# Patient Record
Sex: Male | Born: 1960
Health system: Southern US, Community
[De-identification: ages and names within clinical notes are randomized; demographics above are authoritative.]

## PROBLEM LIST (undated history)

## (undated) ENCOUNTER — Emergency Department (HOSPITAL_COMMUNITY): Payer: Self-pay | Source: Home / Self Care

## (undated) DIAGNOSIS — E7800 Pure hypercholesterolemia, unspecified: Secondary | ICD-10-CM

## (undated) DIAGNOSIS — I255 Ischemic cardiomyopathy: Secondary | ICD-10-CM

## (undated) DIAGNOSIS — I10 Essential (primary) hypertension: Secondary | ICD-10-CM

## (undated) DIAGNOSIS — M1990 Unspecified osteoarthritis, unspecified site: Secondary | ICD-10-CM

## (undated) DIAGNOSIS — I219 Acute myocardial infarction, unspecified: Secondary | ICD-10-CM

## (undated) DIAGNOSIS — K219 Gastro-esophageal reflux disease without esophagitis: Secondary | ICD-10-CM

## (undated) DIAGNOSIS — I2510 Atherosclerotic heart disease of native coronary artery without angina pectoris: Secondary | ICD-10-CM

## (undated) DIAGNOSIS — E1140 Type 2 diabetes mellitus with diabetic neuropathy, unspecified: Secondary | ICD-10-CM

## (undated) DIAGNOSIS — K279 Peptic ulcer, site unspecified, unspecified as acute or chronic, without hemorrhage or perforation: Secondary | ICD-10-CM

## (undated) DIAGNOSIS — G629 Polyneuropathy, unspecified: Secondary | ICD-10-CM

## (undated) DIAGNOSIS — Z87442 Personal history of urinary calculi: Secondary | ICD-10-CM

## (undated) DIAGNOSIS — E669 Obesity, unspecified: Secondary | ICD-10-CM

## (undated) DIAGNOSIS — K859 Acute pancreatitis without necrosis or infection, unspecified: Secondary | ICD-10-CM

## (undated) DIAGNOSIS — F419 Anxiety disorder, unspecified: Secondary | ICD-10-CM

## (undated) HISTORY — DX: Ischemic cardiomyopathy: I25.5

## (undated) HISTORY — DX: Type 2 diabetes mellitus with diabetic neuropathy, unspecified: E11.40

---

## 2011-01-02 DIAGNOSIS — I219 Acute myocardial infarction, unspecified: Secondary | ICD-10-CM

## 2011-01-02 HISTORY — PX: ESOPHAGOGASTRODUODENOSCOPY: SHX1529

## 2011-01-02 HISTORY — DX: Acute myocardial infarction, unspecified: I21.9

## 2011-06-17 DIAGNOSIS — E785 Hyperlipidemia, unspecified: Secondary | ICD-10-CM | POA: Diagnosis present

## 2011-06-17 DIAGNOSIS — Z6838 Body mass index (BMI) 38.0-38.9, adult: Secondary | ICD-10-CM

## 2011-06-17 DIAGNOSIS — I10 Essential (primary) hypertension: Secondary | ICD-10-CM | POA: Diagnosis present

## 2011-06-17 DIAGNOSIS — K269 Duodenal ulcer, unspecified as acute or chronic, without hemorrhage or perforation: Secondary | ICD-10-CM | POA: Diagnosis present

## 2011-06-17 DIAGNOSIS — K259 Gastric ulcer, unspecified as acute or chronic, without hemorrhage or perforation: Principal | ICD-10-CM | POA: Diagnosis present

## 2011-06-17 DIAGNOSIS — E669 Obesity, unspecified: Secondary | ICD-10-CM | POA: Diagnosis present

## 2011-06-17 DIAGNOSIS — IMO0001 Reserved for inherently not codable concepts without codable children: Secondary | ICD-10-CM | POA: Diagnosis present

## 2011-06-17 DIAGNOSIS — E7800 Pure hypercholesterolemia, unspecified: Secondary | ICD-10-CM | POA: Diagnosis present

## 2011-06-17 DIAGNOSIS — K859 Acute pancreatitis without necrosis or infection, unspecified: Secondary | ICD-10-CM | POA: Diagnosis present

## 2011-06-17 DIAGNOSIS — Z79899 Other long term (current) drug therapy: Secondary | ICD-10-CM

## 2011-06-17 DIAGNOSIS — Z7982 Long term (current) use of aspirin: Secondary | ICD-10-CM

## 2011-06-18 ENCOUNTER — Emergency Department (HOSPITAL_COMMUNITY): Payer: Medicaid Other

## 2011-06-18 ENCOUNTER — Encounter (HOSPITAL_COMMUNITY): Payer: Self-pay

## 2011-06-18 ENCOUNTER — Encounter (HOSPITAL_COMMUNITY)
Admission: EM | Disposition: A | Discharge status: Home / Self Care | Payer: Self-pay | Source: Home / Self Care | Attending: Internal Medicine

## 2011-06-18 ENCOUNTER — Inpatient Hospital Stay (HOSPITAL_COMMUNITY)
Admission: EM | Admit: 2011-06-18 | Discharge: 2011-06-19 | DRG: 383 | Disposition: A | Discharge status: Home / Self Care | Payer: Medicaid Other | Attending: Internal Medicine | Admitting: Internal Medicine

## 2011-06-18 DIAGNOSIS — K259 Gastric ulcer, unspecified as acute or chronic, without hemorrhage or perforation: Secondary | ICD-10-CM

## 2011-06-18 DIAGNOSIS — E109 Type 1 diabetes mellitus without complications: Secondary | ICD-10-CM | POA: Diagnosis present

## 2011-06-18 DIAGNOSIS — K269 Duodenal ulcer, unspecified as acute or chronic, without hemorrhage or perforation: Secondary | ICD-10-CM

## 2011-06-18 DIAGNOSIS — I10 Essential (primary) hypertension: Secondary | ICD-10-CM

## 2011-06-18 DIAGNOSIS — K859 Acute pancreatitis without necrosis or infection, unspecified: Secondary | ICD-10-CM

## 2011-06-18 DIAGNOSIS — E1165 Type 2 diabetes mellitus with hyperglycemia: Secondary | ICD-10-CM

## 2011-06-18 DIAGNOSIS — E782 Mixed hyperlipidemia: Secondary | ICD-10-CM | POA: Diagnosis present

## 2011-06-18 DIAGNOSIS — R109 Unspecified abdominal pain: Secondary | ICD-10-CM

## 2011-06-18 DIAGNOSIS — R1013 Epigastric pain: Secondary | ICD-10-CM | POA: Diagnosis present

## 2011-06-18 DIAGNOSIS — IMO0001 Reserved for inherently not codable concepts without codable children: Secondary | ICD-10-CM

## 2011-06-18 DIAGNOSIS — E785 Hyperlipidemia, unspecified: Secondary | ICD-10-CM | POA: Diagnosis present

## 2011-06-18 DIAGNOSIS — E669 Obesity, unspecified: Secondary | ICD-10-CM

## 2011-06-18 HISTORY — DX: Essential (primary) hypertension: I10

## 2011-06-18 HISTORY — DX: Pure hypercholesterolemia, unspecified: E78.00

## 2011-06-18 HISTORY — PX: ESOPHAGOGASTRODUODENOSCOPY: SHX5428

## 2011-06-18 LAB — LIPASE, BLOOD: Lipase: 274 U/L — ABNORMAL HIGH (ref 11–59)

## 2011-06-18 LAB — BASIC METABOLIC PANEL
CO2: 25 mEq/L (ref 19–32)
Calcium: 10.1 mg/dL (ref 8.4–10.5)
Creatinine, Ser: 0.81 mg/dL (ref 0.50–1.35)
Glucose, Bld: 314 mg/dL — ABNORMAL HIGH (ref 70–99)
Sodium: 133 mEq/L — ABNORMAL LOW (ref 135–145)

## 2011-06-18 LAB — DIFFERENTIAL
Blasts: 0 %
Lymphocytes Relative: 19 % (ref 12–46)
Lymphs Abs: 2.1 10*3/uL (ref 0.7–4.0)
Neutro Abs: 7.5 10*3/uL (ref 1.7–7.7)
Neutrophils Relative %: 66 % (ref 43–77)
Promyelocytes Absolute: 0 %
nRBC: 0 /100 WBC

## 2011-06-18 LAB — URINALYSIS, ROUTINE W REFLEX MICROSCOPIC
Glucose, UA: >1000 mg/dL — AB
Hgb urine dipstick: NEGATIVE
Leukocytes, UA: NEGATIVE
Protein, ur: NEGATIVE mg/dL

## 2011-06-18 LAB — HEPATIC FUNCTION PANEL
AST: 26 U/L (ref 0–37)
Albumin: 4.2 g/dL (ref 3.5–5.2)
Bilirubin, Direct: 0.1 mg/dL (ref 0.0–0.3)
Total Bilirubin: 0.4 mg/dL (ref 0.3–1.2)

## 2011-06-18 LAB — HEMOGLOBIN A1C: Mean Plasma Glucose: 235 mg/dL — ABNORMAL HIGH (ref <117)

## 2011-06-18 LAB — URINE MICROSCOPIC-ADD ON

## 2011-06-18 LAB — CBC
MCHC: 36.3 g/dL — ABNORMAL HIGH (ref 30.0–36.0)
RDW: 12.3 % (ref 11.5–15.5)

## 2011-06-18 LAB — GLUCOSE, CAPILLARY: Glucose-Capillary: 233 mg/dL — ABNORMAL HIGH (ref 70–99)

## 2011-06-18 LAB — TSH: TSH: 0.445 u[IU]/mL (ref 0.350–4.500)

## 2011-06-18 SURGERY — EGD (ESOPHAGOGASTRODUODENOSCOPY)
Anesthesia: Moderate Sedation

## 2011-06-18 MED ORDER — ONDANSETRON HCL 4 MG/2ML IJ SOLN: 4.0000 mg | Freq: Four times a day (QID) | INTRAMUSCULAR | Status: DC | PRN

## 2011-06-18 MED ORDER — INSULIN ASPART 100 UNIT/ML ~~LOC~~ SOLN
0.0000 [IU] | Freq: Every day | SUBCUTANEOUS | Status: DC
  Administered 2011-06-18: 2 [IU] via SUBCUTANEOUS

## 2011-06-18 MED ORDER — HYDRALAZINE HCL 20 MG/ML IJ SOLN
INTRAMUSCULAR | Status: AC
  Filled 2011-06-18: qty 1

## 2011-06-18 MED ORDER — MEPERIDINE HCL 100 MG/ML IJ SOLN
INTRAMUSCULAR | Status: DC | PRN
  Administered 2011-06-18: 25 mg via INTRAVENOUS
  Administered 2011-06-18: 50 mg via INTRAVENOUS

## 2011-06-18 MED ORDER — ONDANSETRON HCL 4 MG PO TABS: 4.0000 mg | ORAL_TABLET | Freq: Four times a day (QID) | ORAL | Status: DC | PRN

## 2011-06-18 MED ORDER — STERILE WATER FOR IRRIGATION IR SOLN
Status: DC | PRN
  Administered 2011-06-18: 14:00:00

## 2011-06-18 MED ORDER — MORPHINE SULFATE 2 MG/ML IJ SOLN
2.0000 mg | INTRAMUSCULAR | Status: DC | PRN
  Administered 2011-06-18 – 2011-06-19 (×3): 2 mg via INTRAVENOUS
  Filled 2011-06-18 (×3): qty 1

## 2011-06-18 MED ORDER — HEPARIN SODIUM (PORCINE) 5000 UNIT/ML IJ SOLN
5000.0000 [IU] | Freq: Three times a day (TID) | INTRAMUSCULAR | Status: DC
  Administered 2011-06-18 – 2011-06-19 (×2): 5000 [IU] via SUBCUTANEOUS
  Filled 2011-06-18 (×2): qty 1

## 2011-06-18 MED ORDER — PANTOPRAZOLE SODIUM 40 MG IV SOLR
40.0000 mg | INTRAVENOUS | Status: DC
  Administered 2011-06-18: 40 mg via INTRAVENOUS
  Filled 2011-06-18: qty 40

## 2011-06-18 MED ORDER — POTASSIUM CHLORIDE IN NACL 20-0.9 MEQ/L-% IV SOLN
INTRAVENOUS | Status: DC
  Administered 2011-06-18 – 2011-06-19 (×2): via INTRAVENOUS

## 2011-06-18 MED ORDER — ONDANSETRON HCL 4 MG/2ML IJ SOLN
4.0000 mg | Freq: Once | INTRAMUSCULAR | Status: AC
  Administered 2011-06-18: 4 mg via INTRAVENOUS
  Filled 2011-06-18: qty 2

## 2011-06-18 MED ORDER — BUTAMBEN-TETRACAINE-BENZOCAINE 2-2-14 % EX AERO
INHALATION_SPRAY | CUTANEOUS | Status: DC | PRN
  Administered 2011-06-18: 2 via TOPICAL

## 2011-06-18 MED ORDER — HYDROMORPHONE HCL PF 1 MG/ML IJ SOLN: 1.0000 mg | INTRAMUSCULAR | Status: DC | PRN

## 2011-06-18 MED ORDER — HYDRALAZINE HCL 20 MG/ML IJ SOLN: 10.0000 mg | Freq: Once | INTRAMUSCULAR | Status: DC

## 2011-06-18 MED ORDER — MIDAZOLAM HCL 5 MG/5ML IJ SOLN
INTRAMUSCULAR | Status: DC | PRN
  Administered 2011-06-18: 2 mg via INTRAVENOUS
  Administered 2011-06-18: 1 mg via INTRAVENOUS
  Administered 2011-06-18: 2 mg via INTRAVENOUS

## 2011-06-18 MED ORDER — PANTOPRAZOLE SODIUM 40 MG IV SOLR
40.0000 mg | Freq: Two times a day (BID) | INTRAVENOUS | Status: DC
  Administered 2011-06-18: 40 mg via INTRAVENOUS
  Filled 2011-06-18: qty 40

## 2011-06-18 MED ORDER — SODIUM CHLORIDE 0.9 % IV SOLN: INTRAVENOUS | Status: AC

## 2011-06-18 MED ORDER — SODIUM CHLORIDE 0.45 % IV SOLN
INTRAVENOUS | Status: DC
  Administered 2011-06-18: 13:00:00 via INTRAVENOUS

## 2011-06-18 MED ORDER — MEPERIDINE HCL 100 MG/ML IJ SOLN
INTRAMUSCULAR | Status: AC
  Filled 2011-06-18: qty 1

## 2011-06-18 MED ORDER — ONDANSETRON HCL 4 MG/2ML IJ SOLN: 4.0000 mg | Freq: Three times a day (TID) | INTRAMUSCULAR | Status: DC | PRN

## 2011-06-18 MED ORDER — HYDROMORPHONE HCL PF 1 MG/ML IJ SOLN
1.0000 mg | Freq: Once | INTRAMUSCULAR | Status: AC
  Administered 2011-06-18: 1 mg via INTRAVENOUS
  Filled 2011-06-18: qty 1

## 2011-06-18 MED ORDER — ONDANSETRON HCL 4 MG/2ML IJ SOLN
4.0000 mg | Freq: Four times a day (QID) | INTRAMUSCULAR | Status: DC | PRN
  Administered 2011-06-18: 4 mg via INTRAVENOUS
  Filled 2011-06-18: qty 2

## 2011-06-18 MED ORDER — SODIUM CHLORIDE 0.9 % IJ SOLN
INTRAMUSCULAR | Status: AC
  Administered 2011-06-18: 10 mL
  Filled 2011-06-18: qty 3

## 2011-06-18 MED ORDER — MIDAZOLAM HCL 5 MG/5ML IJ SOLN
INTRAMUSCULAR | Status: AC
  Filled 2011-06-18: qty 10

## 2011-06-18 MED ORDER — INSULIN ASPART 100 UNIT/ML ~~LOC~~ SOLN
0.0000 [IU] | Freq: Three times a day (TID) | SUBCUTANEOUS | Status: DC
  Administered 2011-06-18: 4 [IU] via SUBCUTANEOUS
  Administered 2011-06-19: 7 [IU] via SUBCUTANEOUS

## 2011-06-18 NOTE — ED Notes (Signed)
Head hurts, having abdominal pain across my entire abdomen, hurting in back per pt. Bp was high tonight per pt.

## 2011-06-18 NOTE — ED Provider Notes (Signed)
51yo M, c/o upper abd pain that began after eating meal yesterday.  Pain more upper abd/mid-epigastric areas, radiates into back.  +elevated lipase.  LFT's normal.  Afebrile.  [place] abd without gallstones/acute cholecystitis.  Pt is comfortable after pain meds.   Dx testing d/w pt and family.  Questions answered.  Verb understanding, agreeable to admit.  T/C to [facility] Dr. Karilyn Cota, case discussed, including:  HPI, pertinent PM/SHx, VS/PE, dx testing, ED course and treatment:  Agreeable to admit, requests to write temporary orders, obtain medical bed to team 2.   Laray Anger, DO 06/18/11 [phone]

## 2011-06-18 NOTE — Consult Note (Signed)
Referring Provider: No ref. provider found Primary Care Physician:  Leo Grosser, MD Primary Gastroenterologist:  Jonette Eva  Reason for Consultation:  ABDOMINAL PAIN  HPI:  PT STARTED HAVING INTERMITTENT ABD PAIN FOR THE LAST WEEK AD A HALF. TAKING IBUPROFEN PRN FOR PAIN. FEELING OK UNTIL ~10 DAYS AGO. HAS NAUSEA BUT NO VOMITING, ?MELENA. PAIN FEELS LIKE A PRESSURE. START IN MID ABD AND RADIATED UNDER BOTH RIBS AND TO HIS BACK. NO DIARRHEA. HAS FELT CONSTIPATED. NO PROBLEMS SWALLOWING OR UNCONTROLLED HEARTBURN. NO WIGHT LOSS. NEVER HAD A TCS. No problems with sedation.   Past Medical History  Diagnosis Date  . Diabetes mellitus   . High cholesterol   . Hypertension     History reviewed. No pertinent past surgical history.  Prior to Admission medications   Medication Sig Start Date End Date Taking? Authorizing Provider  aspirin EC 81 MG tablet Take 81 mg by mouth daily.   Yes Historical Provider, MD  glipiZIDE (GLUCOTROL) 10 MG tablet Take 10 mg by mouth 2 (two) times daily before a meal.   Yes Historical Provider, MD  lisinopril (PRINIVIL,ZESTRIL) 5 MG tablet Take 5 mg by mouth daily.   Yes Historical Provider, MD  metFORMIN (GLUCOPHAGE) 500 MG tablet Take 500 mg by mouth 2 (two) times daily with a meal.   Yes Historical Provider, MD  Red Yeast Rice Extract (RED YEAST RICE PO) Take 1 capsule by mouth 2 (two) times daily.   Yes Historical Provider, MD    Current Facility-Administered Medications  Medication Dose Route Frequency Provider Last Rate Last Dose  . 0.9 %  sodium chloride infusion   Intravenous STAT Laray Anger, DO      . 0.9 % NaCl with KCl 20 mEq/ L  infusion   Intravenous Continuous Nimish C Karilyn Cota, MD 75 mL/hr at 06/18/11 1113    . heparin injection 5,000 Units  5,000 Units Subcutaneous Q8H Nimish C Gosrani, MD      . HYDROmorphone (DILAUDID) injection 1 mg  1 mg Intravenous Once Nicoletta Dress. Colon Branch, MD   1 mg at 06/18/11 0333  . insulin aspart (novoLOG)  injection 0-20 Units  0-20 Units Subcutaneous TID WC Nimish C Gosrani, MD      . insulin aspart (novoLOG) injection 0-5 Units  0-5 Units Subcutaneous QHS Nimish C Gosrani, MD      . morphine 2 MG/ML injection 2 mg  2 mg Intravenous Q4H PRN Wilson Singer, MD   2 mg at 06/18/11 1248  . ondansetron (ZOFRAN) injection 4 mg  4 mg Intravenous Once [employer]. Colon Branch, MD   4 mg at 06/18/11 0333  . ondansetron (ZOFRAN) tablet 4 mg  4 mg Oral Q6H PRN Nimish Normajean Glasgow, MD       Or  . ondansetron (ZOFRAN) injection 4 mg  4 mg Intravenous Q6H PRN Nimish C Gosrani, MD      . pantoprazole (PROTONIX) injection 40 mg  40 mg Intravenous BID [place] Bali, MD      . sodium chloride 0.9 % injection        10 mL at 06/18/11 1124  . DISCONTD: HYDROmorphone (DILAUDID) injection 1 mg  1 mg Intravenous Q4H PRN Laray Anger, DO      . DISCONTD: ondansetron [facility]) injection 4 mg  4 mg Intravenous Q8H PRN Laray Anger, DO      . DISCONTD: ondansetron [facility]) injection 4 mg  4 mg Intravenous Q6H PRN Nimish Normajean Glasgow, MD      .  DISCONTD: ondansetron (ZOFRAN) injection 4 mg  4 mg Intravenous Q6H PRN Nimish C Gosrani, MD      . DISCONTD: ondansetron (ZOFRAN) tablet 4 mg  4 mg Oral Q6H PRN Nimish Normajean Glasgow, MD      . DISCONTD: pantoprazole (PROTONIX) injection 40 mg  40 mg Intravenous Q24H Nimish C Gosrani, MD   40 mg at 06/18/11 1123    Allergies as of 06/17/2011  . (No Known Allergies)    Family History:  Colon Cancer  negative                           Polyps  negative   History   Social History  . Marital Status: Married    Spouse Name: N/A    Number of Children: N/A  . Years of Education: N/A   Occupational History  . Not on file.   Social History Main Topics  . Smoking status: Never Smoker   . Smokeless tobacco: Not on file  . Alcohol Use: No  . Drug Use: No  . Sexually Active: Yes   Other Topics Concern  . Not on file   Social History Narrative  . No narrative on file     Review of Systems: PER HPI OTHERWISE ALL SYSTEMS NEGATIVE    Vitals: Blood pressure 144/79, pulse 83, temperature 98 F (36.7 C), temperature source Oral, resp. rate 20, height 5\' 10"  (1.778 m), weight 269 lb 13.5 oz (122.4 kg), SpO2 96.00%.  Physical Exam: General:   Alert,  Well-developed, well-nourished, pleasant and cooperative in NAD Head:  Normocephalic and atraumatic. Eyes:  Sclera clear, no icterus.   Conjunctiva pink. Mouth:  No deformity or lesions, dentition normal. Neck:  Supple;  Lungs:  Clear throughout to auscultation.   No wheezes. No acute distress. Heart:  Regular rate and rhythm; no murmurs. Abdomen:  Soft, OBESE, nondistended. No massesnoted. MILD TO  MOD TTP IN BUQs AND EPIGASTRIUM. Normal bowel sounds, without guarding, and without rebound.   Msk:  Symmetrical without gross deformities.  Extremities:  Without edema. Neurologic:  Alert and  oriented x4;  grossly normal neurologically. Cervical Nodes:  No significant cervical adenopathy. Psych:  Alert and cooperative. Normal mood and affect.   Lab Results:  [facility] 06/18/11 0149  WBC 11.3*  HGB 16.9  HCT 46.5  PLT 268   BMET  Basename 06/18/11 0149  NA 133*  K 4.2  CL 96  CO2 25  GLUCOSE 314*  BUN 19  CREATININE 0.81  CALCIUM 10.1   LFT  Basename 06/18/11 0149  PROT 7.8  ALBUMIN 4.2  AST 26  ALT 43  ALKPHOS 119*  BILITOT 0.4  BILIDIR 0.1  IBILI 0.3     Studies/Results: [facility] REVIEWED WITH DR. Tyron Russell. NO FREE AIR. U/S: PANCREAS NOT VISUALIZED, NO GALLSTONES, ? FATTY LIVER   Impression: ABDOMINAL PAIN/NAUSEA FOR 7-10 DAYS, GOT WORSE LAST NIGHT & ASSOCIATED WITH ELEVATED LIPASE & GLUCOSE & ESSENTIALLY NL HFP. NO RISK FACTORS FOR PANCREATITIS. PT HAS BEEN USING IBUPROFEN PRN FOR ABDOMINAL PAIN.  Plan: EGD TODAY TO EVALUATE FOR PUD. CONSIDER EUS AS OP. BID PPI. WOULD RECOMMEND CT ABD W/ IVC AFTER THE SCANNER IS OPERATING. CHECK TRIGLYCERIDES.   LOS: 0 days   Jonette Eva   06/18/2011, 12:52 PM

## 2011-06-18 NOTE — H&P (Signed)
Juan Ray MRN: [id number] DOB/AGE: 1960/01/09 51 y.o. Primary Care Physician:PICKARD,WARREN TOM, MD Admit date: 06/18/2011 Chief Complaint: Epigastric abdominal pain. HPI: This 51 year old man gives a 10 day history of the above symptoms. The epigastric abdominal pain is sharp in nature and radiates to the back. He has not had significant vomiting although he has felt nauseous on 12 occasions. Approximately one week ago he also noticed the passage of one episode of black stool. He has been taking significant amounts of ibuprofen for his abdominal pain. He also takes aspirin 81 mg daily. He does not drink alcohol whatsoever. He does not have a history of hyperlipidemia. Ultrasound of his abdomen in the emergency room did not reveal presence of gallstones. There was suspicion of fatty liver. He is now being admitted for further management.  Past Medical History  Diagnosis Date  . Diabetes mellitus   . High cholesterol   . Hypertension          No family history on file.  Social History:  He is married. 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 He does not smoke, does not drink alcohol.  Allergies: No Known Allergies       JXB:JYNWG from the symptoms mentioned above,there are no other symptoms referable to all systems reviewed.  Physical Exam: Blood pressure 123/83, pulse 91, temperature 98.4 F (36.9 C), temperature source Oral, resp. rate 14, height 5\' 10"  (1.778 m), weight 122.471 kg (270 lb), SpO2 96.00%. He looks systemically well. He is obese. He does not appear to be in acute pain. Heart sounds are present and normal. Lung fields are clear. Abdomen is soft and nontender. Bowel sounds are heard. There is no hepatosplenomegaly. There are no masses felt. He is alert and orientated without any focal neurological signs.    Basename 06/18/11 0149  WBC 11.3*  NEUTROABS 7.5  HGB 16.9  HCT 46.5  MCV 90.5  PLT 268    Basename 06/18/11 0149  NA 133*  K 4.2  CL 96  CO2 25  GLUCOSE  314*  BUN 19  CREATININE 0.81  CALCIUM 10.1  MG --         US Abdomen Complete  06/18/2011  *RADIOLOGY REPORT*  Clinical Data:  Abdominal pain  COMPLETE ABDOMINAL ULTRASOUND  Comparison:  None.  Findings:  Gallbladder:  No gallstones, gallbladder wall thickening, or pericholecystic fluid.  Common bile duct:  Not identified  Liver:  No focal lesion identified. There is a coarse increased echogenicity probable due to fatty infiltration.  IVC:  Not visualized due to abundant bowel gas  Pancreas:   not visualized due to abundant bowel gas  Spleen: 10.8 cm in length.  Normal echogenicity.  Right Kidney:  Measures 12.9 cm in length.  No mass, hydronephrosis or diagnostic renal calculus  Left Kidney:  Measures 12.5 cm in length.  No mass, hydronephrosis or diagnostic renal calculus  Abdominal aorta: Not visualized due to patient body habitus.  IMPRESSION:  1.  No gallstones are noted within gallbladder. 2.  CBD is not visualized due to patient body habitus and bowel gas.  Pancreas and IVC are not visualized.  The abdominal aorta not visualized. 3.  No hydronephrosis or diagnostic renal calculus. 4.  Diffuse coarse increased echogenicity of the liver suspicious for fatty infiltration.  Original Report Authenticated By: Natasha Mead, M.D.   Dg Abd Acute W/chest  06/18/2011  *RADIOLOGY REPORT*  Clinical Data: Right upper quadrant abdominal pain for 4 days.  ACUTE ABDOMEN SERIES (ABDOMEN 2 VIEW & CHEST  1 VIEW)  Comparison: Chest radiograph performed 09/17/2003  Findings: The lungs are well-aerated and clear.  There is no evidence of focal opacification, pleural effusion or pneumothorax. The cardiomediastinal silhouette is within normal limits.  The visualized bowel gas pattern is unremarkable.  Scattered stool and air are seen within the colon; there is no evidence of small bowel dilatation to suggest obstruction.  No free intra-abdominal air is identified on the provided upright view.  No acute osseous  abnormalities are seen; the sacroiliac joints are unremarkable in appearance.  IMPRESSION:  1.  Unremarkable bowel gas pattern; no free intra-abdominal air seen. 2.  No acute cardiopulmonary process identified.  Original Report Authenticated By: Tonia Ghent, M.D.   Impression: 1. Epigastric pain radiating to the back,? Peptic ulcer versus pancreatitis. Lipase is elevated but the history is not suggestive of pancreatitis. CT scan is unavailable today from radiology. 2. Hypertension, controlled. 3. Type 2 diabetes mellitus, uncontrolled. 4. Hyperlipidemia. 5. Obesity.     Plan: 1. Admit to medical floor. 2. N.p.o. and IV fluids. 3. Gastroenterology consultation. I think this patient needs an EGD. I am not totally convinced by history that he has pancreatitis. 4. Monitor and control hypertension and diabetes. Check lipid panel, especially looking for hypertriglyceridemia. Further recommendations will depend on patient's hospital progress.      Wilson Singer Pager [phone]  06/18/2011, 10:26 AM

## 2011-06-18 NOTE — Progress Notes (Signed)
UR Chart Review Completed  

## 2011-06-18 NOTE — ED Provider Notes (Addendum)
History     CSN: [id number]  Arrival date & time 06/17/11  2359   First MD Initiated Contact with Patient 06/18/11 [phone]      Chief Complaint  Patient presents with  . Abdominal Pain  . Headache  . Hypertension    (Consider location/radiation/quality/duration/timing/severity/associated sxs/prior treatment) HPI Juan Ray is a 51 y.o. male who presents to the Emergency Department complaining of upper abdominal pain present for several days and worse tonight after eating the afternoon meal. Pain is across the upper abdomen and radiates into the back. Denies nausea, vomiting or diarrhea. Denies fever, chills. Has taken no medicine. Has been using ibuprofen for headaches over the last 10 days.   PCP Dr. Tanya Nones  Past Medical History  Diagnosis Date  . Diabetes mellitus   . High cholesterol   . Hypertension     History reviewed. No pertinent past surgical history.  No family history on file.  History  Substance Use Topics  . Smoking status: Never Smoker   . Smokeless tobacco: Not on file  . Alcohol Use: No      Review of Systems  Constitutional: Negative for fever.       10 Systems reviewed and are negative for acute change except as noted in the HPI.  HENT: Negative for congestion.   Eyes: Negative for discharge and redness.  Respiratory: Negative for cough and shortness of breath.   Cardiovascular: Negative for chest pain.  Gastrointestinal: Positive for abdominal pain. Negative for nausea and vomiting.  Musculoskeletal: Negative for back pain.  Skin: Negative for rash.  Neurological: Negative for syncope, numbness and headaches.  Psychiatric/Behavioral:       No behavior change.    Allergies  Review of patient's allergies indicates no known allergies.  Home Medications   Current Outpatient Rx  Name Route Sig Dispense Refill  . ASPIRIN 81 MG PO CHEW Oral Chew 81 mg by mouth daily.    Marland Kitchen GLIPIZIDE 10 MG PO TABS Oral Take 10 mg by mouth 2 (two) times  daily before a meal.    . LISINOPRIL 5 MG PO TABS Oral Take 5 mg by mouth daily.    Marland Kitchen METFORMIN HCL 500 MG PO TABS Oral Take 500 mg by mouth 2 (two) times daily with a meal.      BP 121/73  Pulse 89  Temp 97.8 F (36.6 C) (Oral)  Resp 16  Ht 5\' 10"  (1.778 m)  Wt 270 lb (122.471 kg)  BMI 38.74 kg/m2  SpO2 96%  Physical Exam  Nursing note and vitals reviewed. Constitutional: He is oriented to person, place, and time. He appears well-developed and well-nourished. He appears distressed.       Awake, alert, nontoxic appearance.  HENT:  Head: Normocephalic and atraumatic.  Eyes: Conjunctivae and EOM are normal. Pupils are equal, round, and reactive to light. Right eye exhibits no discharge. Left eye exhibits no discharge.  Neck: Normal range of motion. Neck supple.  Cardiovascular: Normal rate, normal heart sounds and intact distal pulses.  Exam reveals no gallop and no friction rub.   No murmur heard. Pulmonary/Chest: Effort normal. He has no wheezes. He has no rales. He exhibits no tenderness.  Abdominal: Soft. Bowel sounds are normal. He exhibits no distension and no mass. There is tenderness. There is no rebound and no guarding.       RUQ and epigastric tenderness to palpation.   Musculoskeletal: He exhibits no tenderness.       Baseline ROM, no obvious  new focal weakness.  Neurological: He is alert and oriented to person, place, and time. He has normal reflexes.       Mental status and motor strength appears baseline for patient and situation.  Skin: Skin is warm and dry. No rash noted.  Psychiatric: He has a normal mood and affect.    ED Course  Procedures (including critical care time)  Results for orders placed during the hospital encounter of 06/18/11  CBC      Component Value Range   WBC 11.3 (*) 4.0 - 10.5 K/uL   RBC 5.14  4.22 - 5.81 MIL/uL   Hemoglobin 16.9  13.0 - 17.0 g/dL   HCT 96.0  45.4 - 09.8 %   MCV 90.5  78.0 - 100.0 fL   MCH 32.9  26.0 - 34.0 pg   MCHC  36.3 (*) 30.0 - 36.0 g/dL   RDW 11.9  14.7 - 82.9 %   Platelets 268  150 - 400 K/uL  DIFFERENTIAL      Component Value Range   Neutrophils Relative 66  43 - 77 %   Lymphocytes Relative 19  12 - 46 %   Monocytes Relative 12  3 - 12 %   Eosinophils Relative 3  0 - 5 %   Basophils Relative 0  0 - 1 %   Band Neutrophils 0  0 - 10 %   Metamyelocytes Relative 0     Myelocytes 0     Promyelocytes Absolute 0     Blasts 0     nRBC 0  0 /100 WBC   Neutro Abs 7.5  1.7 - 7.7 K/uL   Lymphs Abs 2.1  0.7 - 4.0 K/uL   Monocytes Absolute 1.4 (*) 0.1 - 1.0 K/uL   Eosinophils Absolute 0.3  0.0 - 0.7 K/uL   Basophils Absolute 0.0  0.0 - 0.1 K/uL  BASIC METABOLIC PANEL      Component Value Range   Sodium 133 (*) 135 - 145 mEq/L   Potassium 4.2  3.5 - 5.1 mEq/L   Chloride 96  96 - 112 mEq/L   CO2 25  19 - 32 mEq/L   Glucose, Bld 314 (*) 70 - 99 mg/dL   BUN 19  6 - 23 mg/dL   Creatinine, Ser 5.62  0.50 - 1.35 mg/dL   Calcium 13.0  8.4 - 86.5 mg/dL   GFR calc non Af Amer >90  >90 mL/min   GFR calc Af Amer >90  >90 mL/min  URINALYSIS, ROUTINE W REFLEX MICROSCOPIC      Component Value Range   Color, Urine YELLOW  YELLOW   APPearance CLEAR  CLEAR   Specific Gravity, Urine 1.025  1.005 - 1.030   pH 5.5  5.0 - 8.0   Glucose, UA >1000 (*) NEGATIVE mg/dL   Hgb urine dipstick NEGATIVE  NEGATIVE   Bilirubin Urine NEGATIVE  NEGATIVE   Ketones, ur TRACE (*) NEGATIVE mg/dL   Protein, ur NEGATIVE  NEGATIVE mg/dL   Urobilinogen, UA 0.2  0.0 - 1.0 mg/dL   Nitrite NEGATIVE  NEGATIVE   Leukocytes, UA NEGATIVE  NEGATIVE  HEPATIC FUNCTION PANEL      Component Value Range   Total Protein 7.8  6.0 - 8.3 g/dL   Albumin 4.2  3.5 - 5.2 g/dL   AST 26  0 - 37 U/L   ALT 43  0 - 53 U/L   Alkaline Phosphatase 119 (*) 39 - 117 U/L   Total Bilirubin  0.4  0.3 - 1.2 mg/dL   Bilirubin, Direct 0.1  0.0 - 0.3 mg/dL   Indirect Bilirubin 0.3  0.3 - 0.9 mg/dL  LIPASE, BLOOD      Component Value Range   Lipase 274 (*) 11  - 59 U/L  URINE MICROSCOPIC-ADD ON      Component Value Range   Squamous Epithelial / LPF RARE  RARE   WBC, UA 0-2  <3 WBC/hpf   RBC / HPF 0-2  <3 RBC/hpf   Bacteria, UA RARE  RARE   Dg Abd Acute W/chest  06/18/2011  *RADIOLOGY REPORT*  Clinical Data: Right upper quadrant abdominal pain for 4 days.  ACUTE ABDOMEN SERIES (ABDOMEN 2 VIEW & CHEST 1 VIEW)  Comparison: Chest radiograph performed 09/17/2003  Findings: The lungs are well-aerated and clear.  There is no evidence of focal opacification, pleural effusion or pneumothorax. The cardiomediastinal silhouette is within normal limits.  The visualized bowel gas pattern is unremarkable.  Scattered stool and air are seen within the colon; there is no evidence of small bowel dilatation to suggest obstruction.  No free intra-abdominal air is identified on the provided upright view.  No acute osseous abnormalities are seen; the sacroiliac joints are unremarkable in appearance.  IMPRESSION:  1.  Unremarkable bowel gas pattern; no free intra-abdominal air seen. 2.  No acute cardiopulmonary process identified.  Original Report Authenticated By: Tonia Ghent, M.D.        MDM  Patient with RUQ and epigastric pain with radiation to the back. Labs with elevated WBC, Lipase of 274 and no elevation in LFTs except for alk phos at 119. Patient still has his gallbladder. Scheduled for ultrasound this morning to look at gallbladder. If no stones or sludge, consider admission for symptomatic pancreatitis. Patient has received IVF, analgesic x 1 and antiemetic with relief of pain. He has been NPO since arrival. Last ate at 6:30 PM last night. Care/disposition to Dr. Clarene Duke.Pt stable in ED with no significant deterioration in condition.  MDM Reviewed: nursing note and vitals Interpretation: labs and x-ray           Nicoletta Dress. Colon Branch, MD 06/18/11 1191  Nicoletta Dress. Colon Branch, MD 06/19/11 [phone]

## 2011-06-18 NOTE — H&P (Signed)
  Primary Care Physician:  Leo Grosser, MD Primary Gastroenterologist:  Dr. Darrick Penna  Pre-Procedure History & Physical: HPI:  Juan Ray is a 51 y.o. male here for ABDOMINAL PAIN/?melena-LIPASE 274.   Past Medical History  Diagnosis Date  . Diabetes mellitus   . High cholesterol   . Hypertension     History reviewed. No pertinent past surgical history.  Prior to Admission medications   Medication Sig Start Date End Date Taking? Authorizing Provider  aspirin EC 81 MG tablet Take 81 mg by mouth daily.   Yes Historical Provider, MD  glipiZIDE (GLUCOTROL) 10 MG tablet Take 10 mg by mouth 2 (two) times daily before a meal.   Yes Historical Provider, MD  lisinopril (PRINIVIL,ZESTRIL) 5 MG tablet Take 5 mg by mouth daily.   Yes Historical Provider, MD  metFORMIN (GLUCOPHAGE) 500 MG tablet Take 500 mg by mouth 2 (two) times daily with a meal.   Yes Historical Provider, MD  Red Yeast Rice Extract (RED YEAST RICE PO) Take 1 capsule by mouth 2 (two) times daily.   Yes Historical Provider, MD    Allergies as of 06/17/2011  . (No Known Allergies)    History reviewed. No pertinent family history.  History   Social History  . Marital Status: Married    Spouse Name: N/A    Number of Children: N/A  . Years of Education: N/A   Occupational History  . Not on file.   Social History Main Topics  . Smoking status: Never Smoker   . Smokeless tobacco: Not on file  . Alcohol Use: No  . Drug Use: No  . Sexually Active: Yes   Other Topics Concern  . Not on file   Social History Narrative  . No narrative on file    Review of Systems: See HPI, otherwise negative ROS   Physical Exam: BP 139/86  Pulse 86  Temp 98 F (36.7 C) (Oral)  Resp 12  Ht 5\' 10"  (1.778 m)  Wt 269 lb (122.018 kg)  BMI 38.60 kg/m2  SpO2 98% General:   Alert,  pleasant and cooperative in NAD Head:  Normocephalic and atraumatic. Neck:  Supple;. Lungs:  Clear throughout to auscultation.    Heart:   Regular rate and rhythm. Abdomen:  Soft, nontender and nondistended. Normal bowel sounds, without guarding, and without rebound.   Neurologic:  Alert and  oriented x4;  grossly normal neurologically.  Impression/Plan:     ABDOMINAL PAIN/?melena  PLAN:  EGD TODAY

## 2011-06-18 NOTE — Op Note (Signed)
[facility] [address] [place], [place]  [address]  ENDOSCOPY PROCEDURE REPORT  PATIENT:  Juan, Ray  MR#:  [id number] BIRTHDATE:  [birth year], 50 yrs. old  GENDER:  male  ENDOSCOPIST:  Jonette Eva, MD Referred by:  Lynnea Ferrier, M.D.  PROCEDURE DATE:  06/18/2011 PROCEDURE:  EGD with biopsy, [address] ASA CLASS: INDICATIONS:  ABDOMINAL PAIN FOR 10 DAYS USING IBUPROFEN LIPASE 274 NO RISK FACTORS FOR PANCREATITIS  MEDICATIONS:   Demerol 75 mg IV, Versed 5 mg IV TOPICAL ANESTHETIC:  Cetacaine Spray  DESCRIPTION OF PROCEDURE:     Physical exam was performed. Informed consent was obtained from the patient after explaining the benefits, risks, and alternatives to the procedure.  The patient was connected to the monitor and placed in the left lateral position.  Continuous oxygen was provided by nasal cannula and IV medicine administered through an indwelling cannula.  After administration of sedation, the patient's esophagus was intubated and the [id number] ([id number]) endoscope was advanced under direct visualization to the second portion of the duodenum.  The scope was removed slowly by carefully examining the color, texture, anatomy, and integrity of the mucosa on the way out.  The patient was recovered in endoscopy and discharged home in satisfactory condition. <<PROCEDUREIMAGES>>  NL ESOPHAGUS. Multiple ulcers were found in the PROXIMAL stomach WITH STIGMATA OF BLEEDING.  BIOPSIES OBTAINED VIA COLD FORCEPS. Multiple SUPERFICIAL ulcers were found in the bulb of the duodenum. NL AMPULLA/SECOND PORTION OF THE DUODENUM.  COMPLICATIONS:    None  ENDOSCOPIC IMPRESSION: 1) UlcerATED LESIONS, multiple in the PROXIMAL stomach, LIKELY DUE TO NSAIDS 2) SUPERFICIAL Ulcers, multiple in the bulb of duodenum LIKELY DUE TO NSAIDS  RECOMMENDATIONS: AWAIT BIOPSY BID PPI AVOID NSAIDS AWAIT CT A/P W/ IVC CLEAR LIQUIDS  REPEAT EXAM:   No  ______________________________ Jonette Eva, MD  CC:  n. eSIGNED:   Jahzeel Poythress at 06/18/2011 02:08 PM  [place], [phone]

## 2011-06-19 ENCOUNTER — Inpatient Hospital Stay (HOSPITAL_COMMUNITY): Payer: Medicaid Other

## 2011-06-19 DIAGNOSIS — E1165 Type 2 diabetes mellitus with hyperglycemia: Secondary | ICD-10-CM

## 2011-06-19 DIAGNOSIS — K859 Acute pancreatitis without necrosis or infection, unspecified: Secondary | ICD-10-CM

## 2011-06-19 DIAGNOSIS — K269 Duodenal ulcer, unspecified as acute or chronic, without hemorrhage or perforation: Secondary | ICD-10-CM

## 2011-06-19 DIAGNOSIS — K259 Gastric ulcer, unspecified as acute or chronic, without hemorrhage or perforation: Secondary | ICD-10-CM

## 2011-06-19 DIAGNOSIS — IMO0001 Reserved for inherently not codable concepts without codable children: Secondary | ICD-10-CM

## 2011-06-19 DIAGNOSIS — R1013 Epigastric pain: Secondary | ICD-10-CM

## 2011-06-19 DIAGNOSIS — I10 Essential (primary) hypertension: Secondary | ICD-10-CM

## 2011-06-19 LAB — CBC
MCV: 91.3 fL (ref 78.0–100.0)
Platelets: 251 10*3/uL (ref 150–400)
RBC: 4.93 MIL/uL (ref 4.22–5.81)
WBC: 10.5 10*3/uL (ref 4.0–10.5)

## 2011-06-19 LAB — LIPID PANEL
Cholesterol: 254 mg/dL — ABNORMAL HIGH (ref 0–200)
HDL: 34 mg/dL — ABNORMAL LOW (ref >39)
Total CHOL/HDL Ratio: 7.5 RATIO
Triglycerides: 332 mg/dL — ABNORMAL HIGH (ref <150)
VLDL: 66 mg/dL — ABNORMAL HIGH (ref 0–40)

## 2011-06-19 LAB — COMPREHENSIVE METABOLIC PANEL
Albumin: 3.7 g/dL (ref 3.5–5.2)
BUN: 11 mg/dL (ref 6–23)
CO2: 27 mEq/L (ref 19–32)
Calcium: 9.3 mg/dL (ref 8.4–10.5)
Chloride: 100 mEq/L (ref 96–112)
GFR calc Af Amer: >90 mL/min (ref >90)
Sodium: 136 mEq/L (ref 135–145)
Total Protein: 7.2 g/dL (ref 6.0–8.3)

## 2011-06-19 LAB — GLUCOSE, CAPILLARY: Glucose-Capillary: 235 mg/dL — ABNORMAL HIGH (ref 70–99)

## 2011-06-19 MED ORDER — PANTOPRAZOLE SODIUM 40 MG PO TBEC: 40.0000 mg | DELAYED_RELEASE_TABLET | Freq: Two times a day (BID) | ORAL | Status: DC

## 2011-06-19 MED ORDER — LORATADINE 10 MG PO TABS
10.0000 mg | ORAL_TABLET | Freq: Every day | ORAL | Status: DC
  Administered 2011-06-19: 10 mg via ORAL
  Filled 2011-06-19: qty 1

## 2011-06-19 MED ORDER — IOHEXOL 300 MG/ML  SOLN
100.0000 mL | Freq: Once | INTRAMUSCULAR | Status: AC | PRN
  Administered 2011-06-19: 100 mL via INTRAVENOUS

## 2011-06-19 MED ORDER — NON FORMULARY: 180.0000 mg | Freq: Every day | Status: DC

## 2011-06-19 NOTE — Progress Notes (Signed)
Subjective: Pt rates pain 2/10 currently.  Denies any nausea, vomiting or melena.  Triglycerides 332.  Objective: Vital signs in last 24 hours: Temp:  [97.7 F (36.5 C)-98.4 F (36.9 C)] 98.3 F (36.8 C) (06/18 0445) Pulse Rate:  [78-91] 85  (06/18 0445) Resp:  [12-20] 16  (06/18 0445) BP: (103-183)/(72-116) 122/79 mmHg (06/18 0445) SpO2:  [94 %-100 %] 98 % (06/18 0445) Weight:  [269 lb (122.018 kg)-269 lb 13.5 oz (122.4 kg)] 269 lb (122.018 kg) (06/17 1307) Last BM Date: 06/17/11 General:   Alert,  Well-developed, well-nourished, pleasant and cooperative in NAD.  Father & wife @ bedside. Eyes:  Sclera clear, no icterus.   Conjunctiva pink. Mouth:  No deformity or lesions, oropharynx pink & moist. Heart:  Regular rate and rhythm; no murmurs, clicks, rubs,  or gallops. Abdomen:  Soft, nontender and nondistended. No masses, hepatosplenomegaly or hernias noted. Normal bowel sounds, without guarding, and without rebound.   Extremities:  Without clubbing or edema. Neurologic:  Alert and  oriented x4;  grossly normal neurologically. Skin:  Intact without significant lesions or rashes. Psych:  Alert and cooperative. Normal mood and affect.  Intake/Output from previous day: 06/17 0701 - 06/18 0700 In: 1858.8 [P.O.:440; I.V.:1418.8] Out: -   Lab Results:  Basename 06/19/11 0532 06/18/11 0149  WBC 10.5 11.3*  HGB 16.1 16.9  HCT 45.0 46.5  PLT 251 268   BMET  Basename 06/19/11 0532 06/18/11 0149  NA 136 133*  K 4.0 4.2  CL 100 96  CO2 27 25  GLUCOSE 245* 314*  BUN 11 19  CREATININE 0.80 0.81  CALCIUM 9.3 10.1   LFT  Basename 06/19/11 0532 06/18/11 0149  PROT 7.2 7.8  ALBUMIN 3.7 4.2  AST 20 26  ALT 35 43  ALKPHOS 101 119*  BILITOT 0.6 0.4  BILIDIR -- 0.1  IBILI -- 0.3  LIPASE -- 274*  AMYLASE -- --    Studies/Results: US Abdomen Complete  06/18/2011  *RADIOLOGY REPORT*  Clinical Data:  Abdominal pain  COMPLETE ABDOMINAL ULTRASOUND  Comparison:  None.  Findings:   Gallbladder:  No gallstones, gallbladder wall thickening, or pericholecystic fluid.  Common bile duct:  Not identified  Liver:  No focal lesion identified. There is a coarse increased echogenicity probable due to fatty infiltration.  IVC:  Not visualized due to abundant bowel gas  Pancreas:   not visualized due to abundant bowel gas  Spleen: 10.8 cm in length.  Normal echogenicity.  Right Kidney:  Measures 12.9 cm in length.  No mass, hydronephrosis or diagnostic renal calculus  Left Kidney:  Measures 12.5 cm in length.  No mass, hydronephrosis or diagnostic renal calculus  Abdominal aorta: Not visualized due to patient body habitus.  IMPRESSION:  1.  No gallstones are noted within gallbladder. 2.  CBD is not visualized due to patient body habitus and bowel gas.  Pancreas and IVC are not visualized.  The abdominal aorta not visualized. 3.  No hydronephrosis or diagnostic renal calculus. 4.  Diffuse coarse increased echogenicity of the liver suspicious for fatty infiltration.  Original Report Authenticated By: Natasha Mead, M.D.   Dg Abd Acute W/chest  06/18/2011  *RADIOLOGY REPORT*  Clinical Data: Right upper quadrant abdominal pain for 4 days.  ACUTE ABDOMEN SERIES (ABDOMEN 2 VIEW & CHEST 1 VIEW)  Comparison: Chest radiograph performed 09/17/2003  Findings: The lungs are well-aerated and clear.  There is no evidence of focal opacification, pleural effusion or pneumothorax. The cardiomediastinal silhouette is within normal  limits.  The visualized bowel gas pattern is unremarkable.  Scattered stool and air are seen within the colon; there is no evidence of small bowel dilatation to suggest obstruction.  No free intra-abdominal air is identified on the provided upright view.  No acute osseous abnormalities are seen; the sacroiliac joints are unremarkable in appearance.  IMPRESSION:  1.  Unremarkable bowel gas pattern; no free intra-abdominal air seen. 2.  No acute cardiopulmonary process identified.  Original Report  Authenticated By: Tonia Ghent, M.D.    Assessment: 1. Abdominal Pain w/ Elevated Lipase:  Suspect acute idiopathic pancreatitis.  2. Multiple gastric/duodenal ulcers suspected secondary to NSAIDs:  Bx pending.   3. Melena: Resolved 4. Fatty Liver w/ normal LFTs:  Modify Risk factors including DM & hyperlipidemia 5. Moderate Hypertriglyceridemia:  Not high enough to be considered risk factor for pancreatitis, however this will need treatment per hospitalist/PCP.  Plan: 1. FU CT scan 2. BID PPI 3. FU biopsies 4. Continue supportive measures including pain control, antiemetics 5. No NSAIDs 6. Treatment for hypercholesterolemia/hypertriglyceridemia per attending. Outpatient Instructions for fatty liver:  Recommend 1-2# weight loss per week until ideal body weight through exercise & diet.  Gradually increase exercise from 15 min daily up to 1 hr per day 5 days/week once over acute illness.  Tighter glycemic & cholesterol control.  LOS: 1 day   Juan Ray  06/19/2011, 8:00 AM

## 2011-06-19 NOTE — Plan of Care (Signed)
Low-Fat Diet BREADS, CEREALS, PASTA, RICE, DRIED PEAS, AND BEANS These products are high in carbohydrates and most are low in fat. Therefore, they can be increased in the diet as substitutes for fatty foods. They too, however, contain calories and should not be eaten in excess. Cereals can be eaten for snacks as well as for breakfast.  Include foods that contain fiber (fruits, vegetables, whole grains, and legumes). Research shows that fiber may lower blood cholesterol levels, especially the water-soluble fiber found in fruits, vegetables, oat products, and legumes. FRUITS AND VEGETABLES It is good to eat fruits and vegetables. Besides being sources of fiber, both are rich in vitamins and some minerals. They help you get the daily allowances of these nutrients. Fruits and vegetables can be used for snacks and desserts. MEATS Limit lean meat, chicken, [place], and fish to no more than 6 ounces per day. Beef, Pork, and Lamb Use lean cuts of beef, pork, and lamb. Lean cuts include:  Extra-lean ground beef.  Arm roast.  Sirloin tip.  Center-cut ham.  Round steak.  Loin chops.  Rump roast.  Tenderloin.  Trim all fat off the outside of meats before cooking. It is not necessary to severely decrease the intake of red meat, but lean choices should be made. Lean meat is rich in protein and contains a highly absorbable form of iron. Premenopausal women, in particular, should avoid reducing lean red meat because this could increase the risk for low red blood cells (iron-deficiency anemia).  Chicken and [place] These are good sources of protein. The fat of poultry can be reduced by removing the skin and underlying fat layers before cooking. Chicken and [place] can be substituted for lean red meat in the diet. Poultry should not be fried or covered with high-fat sauces. Fish and Shellfish Fish is a good source of protein. Shellfish contain cholesterol, but they usually are low in saturated fatty acids. The  preparation of fish is important. Like chicken and [place], they should not be fried or covered with high-fat sauces. EGGS Egg whites contain no fat or cholesterol. They can be eaten often. Try 1 to 2 egg whites instead of whole eggs in recipes or use egg substitutes that do not contain yolk.  MILK AND DAIRY PRODUCTS Use skim or 1% milk instead of 2% or whole milk. Decrease whole milk, natural, and processed cheeses. Use nonfat or low-fat (2%) cottage cheese or low-fat cheeses made from vegetable oils. Choose nonfat or low-fat (1 to 2%) yogurt. Experiment with evaporated skim milk in recipes that call for heavy cream. Substitute low-fat yogurt or low-fat cottage cheese for sour cream in dips and salad dressings. Have at least 2 servings of low-fat dairy products, such as 2 glasses of skim (or 1%) milk each day to help get your daily calcium intake.  FATS AND OILS Butterfat, lard, and beef fats are high in saturated fat and cholesterol. These should be avoided.Vegetable fats do not contain cholesterol. AVOID coconut oil, palm oil, and palm kernel oil, WHICH are very high in saturated fats. These should be limited. These fats are often used in bakery goods, processed foods, popcorn, oils, and nondairy creamers. Vegetable shortenings and some peanut butters contain hydrogenated oils, which are also saturated fats. Read the labels on these foods and check for saturated vegetable oils.  Desirable liquid vegetable oils are corn oil, cottonseed oil, olive oil, canola oil, safflower oil, soybean oil, and sunflower oil. Peanut oil is not as good, but small amounts are acceptable.  Buy a heart-healthy tub margarine that has no partially hydrogenated oils in the ingredients. AVOID Mayonnaise and salad dressings often are made from unsaturated fats.  OTHER EATING TIPS Snacks  Most sweets should be limited as snacks. They tend to be rich in calories and fats, and their caloric content outweighs their nutritional  value. Some good choices in snacks are graham crackers, melba toast, soda crackers, bagels (no egg), English muffins, fruits, and vegetables. These snacks are preferable to snack crackers, [place] fries, and chips. Popcorn should be air-popped or cooked in small amounts of liquid vegetable oil.  Desserts Eat fruit, low-fat yogurt, and fruit ices instead of pastries, cake, and cookies. Sherbet, angel food cake, gelatin dessert, frozen low-fat yogurt, or other frozen products that do not contain saturated fat (pure fruit juice bars, frozen ice pops) are also acceptable.   COOKING METHODS Choose those methods that use little or no fat. They include: Poaching.  Braising.  Steaming.  Grilling.  Baking.  Stir-frying.  Broiling.  Microwaving.  Foods can be cooked in a nonstick pan without added fat, or use a nonfat cooking spray in regular cookware. Limit fried foods and avoid frying in saturated fat. Add moisture to lean meats by using water, broth, cooking wines, and other nonfat or low-fat sauces along with the cooking methods mentioned above. Soups and stews should be chilled after cooking. The fat that forms on top after a few hours in the refrigerator should be skimmed off. When preparing meals, avoid using excess salt. Salt can contribute to raising blood pressure in some people.  EATING AWAY FROM HOME Order entres, potatoes, and vegetables without sauces or butter. When meat exceeds the size of a deck of cards (3 to 4 ounces), the rest can be taken home for another meal. Choose vegetable or fruit salads and ask for low-calorie salad dressings to be served on the side. Use dressings sparingly. Limit high-fat toppings, such as bacon, crumbled eggs, cheese, sunflower seeds, and olives. Ask for heart-healthy tub margarine instead of butter.

## 2011-06-19 NOTE — Progress Notes (Addendum)
Subjective: This man had EGD yesterday. He was found to have multiple ulcers in his stomach and duodenum. Biopsies have been taken. This likely is the cause of the pain and history of possible bleeding. He is scheduled to have CT scan of his abdomen today, if the scan is working. Otherwise, he has a sinus headache at the present time. His abdominal pain has improved.           Physical Exam: Blood pressure 122/79, pulse 85, temperature 98.3 F (36.8 C), temperature source Oral, resp. rate 16, height 5\' 10"  (1.778 m), weight 122.018 kg (269 lb), SpO2 98.00%. He looks systemically well. There are no new physical findings compared to yesterday. He is alert and orientated.   Investigations:     Basic Metabolic Panel:  Basename 06/19/11 0532 06/18/11 0149  NA 136 133*  K 4.0 4.2  CL 100 96  CO2 27 25  GLUCOSE 245* 314*  BUN 11 19  CREATININE 0.80 0.81  CALCIUM 9.3 10.1  MG -- --  PHOS -- --   Liver Function Tests:  Basename 06/19/11 0532 06/18/11 0149  AST 20 26  ALT 35 43  ALKPHOS 101 119*  BILITOT 0.6 0.4  PROT 7.2 7.8  ALBUMIN 3.7 4.2     CBC:  Basename 06/19/11 0532 06/18/11 0149  WBC 10.5 11.3*  NEUTROABS -- 7.5  HGB 16.1 16.9  HCT 45.0 46.5  MCV 91.3 90.5  PLT 251 268    US Abdomen Complete  06/18/2011  *RADIOLOGY REPORT*  Clinical Data:  Abdominal pain  COMPLETE ABDOMINAL ULTRASOUND  Comparison:  None.  Findings:  Gallbladder:  No gallstones, gallbladder wall thickening, or pericholecystic fluid.  Common bile duct:  Not identified  Liver:  No focal lesion identified. There is a coarse increased echogenicity probable due to fatty infiltration.  IVC:  Not visualized due to abundant bowel gas  Pancreas:   not visualized due to abundant bowel gas  Spleen: 10.8 cm in length.  Normal echogenicity.  Right Kidney:  Measures 12.9 cm in length.  No mass, hydronephrosis or diagnostic renal calculus  Left Kidney:  Measures 12.5 cm in length.  No mass, hydronephrosis  or diagnostic renal calculus  Abdominal aorta: Not visualized due to patient body habitus.  IMPRESSION:  1.  No gallstones are noted within gallbladder. 2.  CBD is not visualized due to patient body habitus and bowel gas.  Pancreas and IVC are not visualized.  The abdominal aorta not visualized. 3.  No hydronephrosis or diagnostic renal calculus. 4.  Diffuse coarse increased echogenicity of the liver suspicious for fatty infiltration.  Original Report Authenticated By: Natasha Mead, M.D.   Dg Abd Acute W/chest  06/18/2011  *RADIOLOGY REPORT*  Clinical Data: Right upper quadrant abdominal pain for 4 days.  ACUTE ABDOMEN SERIES (ABDOMEN 2 VIEW & CHEST 1 VIEW)  Comparison: Chest radiograph performed 09/17/2003  Findings: The lungs are well-aerated and clear.  There is no evidence of focal opacification, pleural effusion or pneumothorax. The cardiomediastinal silhouette is within normal limits.  The visualized bowel gas pattern is unremarkable.  Scattered stool and air are seen within the colon; there is no evidence of small bowel dilatation to suggest obstruction.  No free intra-abdominal air is identified on the provided upright view.  No acute osseous abnormalities are seen; the sacroiliac joints are unremarkable in appearance.  IMPRESSION:  1.  Unremarkable bowel gas pattern; no free intra-abdominal air seen. 2.  No acute cardiopulmonary process identified.  Original Report  Authenticated By: Tonia Ghent, M.D.      Medications:  Scheduled:   . sodium chloride   Intravenous STAT  . heparin  5,000 Units Subcutaneous Q8H  . hydrALAZINE  10 mg Intravenous Once  . insulin aspart  0-20 Units Subcutaneous TID WC  . insulin aspart  0-5 Units Subcutaneous QHS  . meperidine      . midazolam      . NON FORMULARY 180 mg  180 mg Oral Daily  . pantoprazole (PROTONIX) IV  40 mg Intravenous BID  . sodium chloride      . DISCONTD: pantoprazole (PROTONIX) IV  40 mg Intravenous Q24H    Impression: 1. Epigastric  pain secondary to multiple ulcers in the stomach and duodenum. 2. Hypertension, controlled. 3. Type 2 diabetes mellitus, uncontrolled. 4. Hyperlipidemia, with elevated LDL cholesterol, triglycerides but not extremely high and reduced HDL.     Plan: 1. Continue with current therapy. 2. CT scan of his abdomen today to see if this will give [place] any more information. 3. Allegra 180 mg daily for sinus headache. 4. Patient can progress diet.     LOS: 1 day   Wilson Singer Pager [phone]  06/19/2011, 7:28 AM

## 2011-06-19 NOTE — Progress Notes (Signed)
06/19/11 1550 Patient discharged home this afternoon with wife. Reviewed discharge instructions with patient via teachback method. Verbalized understanding of instructions. Given copy of instructions, med list, prescription, f/u appointment information. No c/o pain or discomfort prior to discharge. IV site d/c'd and site within normal limits. Pt left floor in stable condition accompanied by nurse tech. Juan Ray

## 2011-06-19 NOTE — Discharge Instructions (Signed)
Acute Pancreatitis Acute pancreatitis is the sudden irritation (inflammation) of the pancreas. The pancreas produces digestive juices or enzymes that break down food. The pancreas also makes 2 hormones that control your blood sugar. If the pancreas is irritated, the enzymes attack internal structures and tissues become damaged. When this happens, the pancreas fails to make new enzymes that break down food. HOME CARE  Eat small meals often. Avoid fatty, greasy, and fried foods.   Follow diet instructions given to you by your doctor.   Avoid foods or drinks that may have started the problem (like alcohol).   Drink enough fluids to keep your pee (urine) clear or pale yellow.   Only take medicine as told by your doctor.   Rest often.   Check your blood sugar at home if you are told to.   Keep all your follow-up visits. This is very important.  GET HELP RIGHT AWAY IF:   You do not get better in the time your doctor said you would.   You have pain, weakness, or feel sick to your stomach (nauseous).   You recover but then have another episode of pain.   You are unable to eat or keep liquids down.   Your pain gets worse or changes.   You have a temperature by mouth above 102 F (38.9 C), not controlled by medicine.   Your skin or the white part of your eyes look yellow.   You start to throw up (vomit).   You feel dizzy or pass out (faint).   Your blood sugar is high (over 300).  MAKE SURE YOU:   Understand these instructions.   Will watch your condition.   Will get help right away if you are not doing well or get worse.  Document Released: 06/06/2007 Document Revised: 12/07/2010 Document Reviewed: 06/06/2007 [employer] Patient Information 2012 [employer] 

## 2011-06-19 NOTE — Progress Notes (Signed)
Patient complained of sinus type headache around nose and under eyes. Morphine did not relieve pain. Applied warm compresses to face. Pt states hx of sinus problems.

## 2011-06-19 NOTE — Discharge Summary (Signed)
Physician Discharge Summary  Patient ID: Juan Ray MRN: [id number] DOB/AGE: [birth year] 51 y.o. Primary Care Physician:PICKARD,WARREN TOM, MD Admit date: 06/18/2011 Discharge date: 06/19/2011    Discharge Diagnoses:  1. Epigastric pain secondary to multiple also in the stomach and duodenum as well as mild idiopathic pancreatitis. 2. Hypertension. 3. Type 2 diabetes mellitus. 4. Hyperlipidemia. 5. Obesity.   Medication List  As of 06/19/2011  3:09 PM   STOP taking these medications         aspirin EC 81 MG tablet      RED YEAST RICE PO         TAKE these medications         glipiZIDE 10 MG tablet   Commonly known as: GLUCOTROL   Take 10 mg by mouth 2 (two) times daily before a meal.      lisinopril 5 MG tablet   Commonly known as: PRINIVIL,ZESTRIL   Take 5 mg by mouth daily.      metFORMIN 500 MG tablet   Commonly known as: GLUCOPHAGE   Take 500 mg by mouth 2 (two) times daily with a meal.      pantoprazole 40 MG tablet   Commonly known as: PROTONIX   Take 1 tablet (40 mg total) by mouth 2 (two) times daily.            Discharged Condition: Stable and improved.    Consults: Gastroenterology, Dr. Darrick Penna.  Significant Diagnostic Studies: US Abdomen Complete  06/18/2011  *RADIOLOGY REPORT*  Clinical Data:  Abdominal pain  COMPLETE ABDOMINAL ULTRASOUND  Comparison:  None.  Findings:  Gallbladder:  No gallstones, gallbladder wall thickening, or pericholecystic fluid.  Common bile duct:  Not identified  Liver:  No focal lesion identified. There is a coarse increased echogenicity probable due to fatty infiltration.  IVC:  Not visualized due to abundant bowel gas  Pancreas:   not visualized due to abundant bowel gas  Spleen: 10.8 cm in length.  Normal echogenicity.  Right Kidney:  Measures 12.9 cm in length.  No mass, hydronephrosis or diagnostic renal calculus  Left Kidney:  Measures 12.5 cm in length.  No mass, hydronephrosis or diagnostic renal calculus  Abdominal  aorta: Not visualized due to patient body habitus.  IMPRESSION:  1.  No gallstones are noted within gallbladder. 2.  CBD is not visualized due to patient body habitus and bowel gas.  Pancreas and IVC are not visualized.  The abdominal aorta not visualized. 3.  No hydronephrosis or diagnostic renal calculus. 4.  Diffuse coarse increased echogenicity of the liver suspicious for fatty infiltration.  Original Report Authenticated By: Natasha Mead, M.D.   Ct Abdomen Pelvis W Contrast  06/19/2011  *RADIOLOGY REPORT*  Clinical Data: Abdominal pain, pancreatitis with elevated lipase, diabetes and hypertension  CT ABDOMEN AND PELVIS WITH CONTRAST  Technique:  Multidetector CT imaging of the abdomen and pelvis was performed following the standard protocol during bolus administration of intravenous contrast.  Contrast: OMNIPAQUE IOHEXOL 300 MG/ML  SOLN  Comparison: None.  Findings: There is diffuse fatty infiltration of the liver.  The gallbladder, spleen, adrenal glands, kidneys, urinary bladder have a normal appearance.  There is a small simple cyst at the midportion of the left kidney.  The bowel is unremarkable with no evidence of gross inflammation or obstruction.  The appendix is seen in the right lower quadrant and has a normal appearance. There is no free fluid, adenopathy, or free air within the abdomen or pelvis. The lung bases  are clear.  There are no osseous lesions.  There is mild, hazy stranding adjacent to the pancreatic head.  The pancreatic tissue enhances homogeneously.  There is no evidence of pseudocyst.  There are several small lymph nodes adjacent to the pancreatic head which are likely reactive; the largest of which measures 1.2 cm in short axis dimension.  IMPRESSION: Evidence of mild pancreatitis with a small amount of stranding adjacent to the pancreatic head and several adjacent mildly enlarged lymph nodes, likely reactive in nature. There is no evidence of pseudocyst or pancreatic necrosis.   Original Report Authenticated By: Brandon Melnick, M.D.   Dg Abd Acute W/chest  06/18/2011  *RADIOLOGY REPORT*  Clinical Data: Right upper quadrant abdominal pain for 4 days.  ACUTE ABDOMEN SERIES (ABDOMEN 2 VIEW & CHEST 1 VIEW)  Comparison: Chest radiograph performed 09/17/2003  Findings: The lungs are well-aerated and clear.  There is no evidence of focal opacification, pleural effusion or pneumothorax. The cardiomediastinal silhouette is within normal limits.  The visualized bowel gas pattern is unremarkable.  Scattered stool and air are seen within the colon; there is no evidence of small bowel dilatation to suggest obstruction.  No free intra-abdominal air is identified on the provided upright view.  No acute osseous abnormalities are seen; the sacroiliac joints are unremarkable in appearance.  IMPRESSION:  1.  Unremarkable bowel gas pattern; no free intra-abdominal air seen. 2.  No acute cardiopulmonary process identified.  Original Report Authenticated By: Tonia Ghent, M.D.    Lab Results: Basic Metabolic Panel:  Basename 06/19/11 0532 06/18/11 0149  NA 136 133*  K 4.0 4.2  CL 100 96  CO2 27 25  GLUCOSE 245* 314*  BUN 11 19  CREATININE 0.80 0.81  CALCIUM 9.3 10.1  MG -- --  PHOS -- --   Liver Function Tests:  Basename 06/19/11 0532 06/18/11 0149  AST 20 26  ALT 35 43  ALKPHOS 101 119*  BILITOT 0.6 0.4  PROT 7.2 7.8  ALBUMIN 3.7 4.2     CBC:  Basename 06/19/11 0532 06/18/11 0149  WBC 10.5 11.3*  NEUTROABS -- 7.5  HGB 16.1 16.9  HCT 45.0 46.5  MCV 91.3 90.5  PLT 251 268       Hospital Course: This 51 year old man came in to the hospital with symptoms of epigastric abdominal pain which radiated to the back. The pain was sharp in nature. There was also question of passing black stool approximately one week ago. The epigastric and abdominal pain had been going on for approximately 10 days. He had been taking significant amounts of ibuprofen for his abdominal pain  also. He was admitted to the hospital, started on intravenous Protonix and kept n.p.o. He underwent EGD which showed several ulcers in the stomach and the duodenum. There were biopsied. He also underwent CT scan of the abdomen which confirmed the presence of mild pancreatitis. His lipase  was elevated on admission. Today his pain was improved and he feels somewhat better and is keen to go home. He is tolerated some diet. Has not had any vomiting.  Discharge Exam: Blood pressure 131/85, pulse 85, temperature 98.2 F (36.8 C), temperature source Oral, resp. rate 18, height 5\' 10"  (1.778 m), weight 122.018 kg (269 lb), SpO2 97.00%. Looks systemically well. Heart sounds are present and normal. Lung fields are clear. Abdomen is soft nontender. He clearly is obese.  Disposition: Home. He will take Protonix 40 mg twice a day for one month and then reduce it  to 40 mg daily. He'll follow with his primary care physician in the next few weeks but he will follow with the gastroenterologist, Dr. Darrick Penna in 6 weeks' time.  Discharge Orders    Future Orders Please Complete By Expires   Diet - low sodium heart healthy      Increase activity slowly         Follow-up Information    Follow up with [facility] TOM, MD. Schedule an appointment as soon as possible for a visit in 1 week.   Contact information:   [address] [place] [address] [phone]          Signed: Wilson Singer Pager [phone]  06/19/2011, 3:09 PM

## 2011-06-19 NOTE — Progress Notes (Signed)
NV resolved. Abd pain improved. Tolerating pos. Ok to d/c home. Pt should follow a low fat diabetic diet. EUS in 6 weeks. OPV in 2 mos w/ DR. Alannie Amodio. PT SHOULD APPLY FOR [employer] DISCOUNT.

## 2011-06-21 ENCOUNTER — Telehealth: Payer: Self-pay | Admitting: Gastroenterology

## 2011-06-21 NOTE — Telephone Encounter (Signed)
REVIEWED.  

## 2011-06-21 NOTE — Telephone Encounter (Signed)
Results Cc to PCP  

## 2011-06-21 NOTE — Telephone Encounter (Signed)
Please call pt. His stomach Bx shows gastritis. Continue PROTONIX 30 minutes prior to meals TIWCE DAILY. OPV IN AUG 2013.

## 2011-06-21 NOTE — Telephone Encounter (Signed)
Patient is scheduled with Dr. Dulce Sellar on 07/23/11 at 3:15 records have been faxed over and patient is aware.

## 2011-06-21 NOTE — Telephone Encounter (Signed)
Pt's wife is aware of results. He does not insurance  So he is using OTC Prilosec 20 mg BID. Will that be okay.

## 2011-06-22 NOTE — Telephone Encounter (Signed)
Called and informed pts wife

## 2011-06-22 NOTE — Telephone Encounter (Signed)
Ok to use [employer].

## 2011-06-25 ENCOUNTER — Encounter (HOSPITAL_COMMUNITY): Payer: Self-pay | Admitting: Gastroenterology

## 2011-07-02 ENCOUNTER — Encounter: Payer: Self-pay | Admitting: Gastroenterology

## 2011-07-02 NOTE — Telephone Encounter (Signed)
Pt is aware of OV on 8/1 at 1pm with [facility] and appt card was mailed

## 2011-07-23 ENCOUNTER — Telehealth: Payer: Self-pay | Admitting: Gastroenterology

## 2011-07-23 NOTE — Telephone Encounter (Signed)
PT MAY CALL TO [facility] WHEN HE RETURNS.

## 2011-07-23 NOTE — Telephone Encounter (Signed)
Dr Hulen Shouts office called to let us know that patient has cancelled his OV with them. Patient is out of town. They said that the EUS would be cancelled as well.

## 2011-07-23 NOTE — Telephone Encounter (Signed)
Dr. Darrick Penna please advise next step?

## 2011-07-25 ENCOUNTER — Encounter (HOSPITAL_COMMUNITY): Payer: Self-pay

## 2011-07-25 ENCOUNTER — Ambulatory Visit (HOSPITAL_COMMUNITY): Admit: 2011-07-25 | Payer: Self-pay | Admitting: Gastroenterology

## 2011-07-25 SURGERY — ESOPHAGEAL ENDOSCOPIC ULTRASOUND (EUS) RADIAL
Anesthesia: Monitor Anesthesia Care

## 2011-08-01 ENCOUNTER — Encounter: Payer: Self-pay | Admitting: Gastroenterology

## 2011-08-02 ENCOUNTER — Telehealth: Payer: Self-pay | Admitting: Urgent Care

## 2011-08-02 ENCOUNTER — Ambulatory Visit: Payer: Self-pay | Admitting: Urgent Care

## 2011-08-02 NOTE — Telephone Encounter (Signed)
Pt was a no show

## 2011-08-02 NOTE — Telephone Encounter (Signed)
Noted  

## 2012-04-07 ENCOUNTER — Emergency Department (HOSPITAL_COMMUNITY): Payer: Medicaid Other

## 2012-04-07 ENCOUNTER — Telehealth: Payer: Self-pay | Admitting: Family Medicine

## 2012-04-07 ENCOUNTER — Ambulatory Visit (HOSPITAL_COMMUNITY): Admit: 2012-04-07 | Payer: Self-pay | Admitting: Cardiovascular Disease

## 2012-04-07 ENCOUNTER — Other Ambulatory Visit: Payer: Self-pay

## 2012-04-07 ENCOUNTER — Encounter (HOSPITAL_COMMUNITY)
Admission: EM | Disposition: A | Discharge status: Home / Self Care | Payer: Self-pay | Source: Home / Self Care | Attending: Cardiovascular Disease

## 2012-04-07 ENCOUNTER — Encounter (HOSPITAL_COMMUNITY): Payer: Self-pay | Admitting: *Deleted

## 2012-04-07 ENCOUNTER — Inpatient Hospital Stay (HOSPITAL_COMMUNITY)
Admission: EM | Admit: 2012-04-07 | Discharge: 2012-04-09 | DRG: 282 | Disposition: A | Discharge status: Home / Self Care | Payer: Medicaid Other | Attending: Cardiovascular Disease | Admitting: Cardiovascular Disease

## 2012-04-07 DIAGNOSIS — E119 Type 2 diabetes mellitus without complications: Secondary | ICD-10-CM | POA: Diagnosis present

## 2012-04-07 DIAGNOSIS — E785 Hyperlipidemia, unspecified: Secondary | ICD-10-CM | POA: Diagnosis present

## 2012-04-07 DIAGNOSIS — Z955 Presence of coronary angioplasty implant and graft: Secondary | ICD-10-CM

## 2012-04-07 DIAGNOSIS — E782 Mixed hyperlipidemia: Secondary | ICD-10-CM | POA: Diagnosis present

## 2012-04-07 DIAGNOSIS — I213 ST elevation (STEMI) myocardial infarction of unspecified site: Secondary | ICD-10-CM

## 2012-04-07 DIAGNOSIS — I2119 ST elevation (STEMI) myocardial infarction involving other coronary artery of inferior wall: Secondary | ICD-10-CM

## 2012-04-07 DIAGNOSIS — I2510 Atherosclerotic heart disease of native coronary artery without angina pectoris: Secondary | ICD-10-CM

## 2012-04-07 DIAGNOSIS — Z79899 Other long term (current) drug therapy: Secondary | ICD-10-CM

## 2012-04-07 DIAGNOSIS — I10 Essential (primary) hypertension: Secondary | ICD-10-CM | POA: Diagnosis present

## 2012-04-07 DIAGNOSIS — E109 Type 1 diabetes mellitus without complications: Secondary | ICD-10-CM | POA: Diagnosis present

## 2012-04-07 DIAGNOSIS — K279 Peptic ulcer, site unspecified, unspecified as acute or chronic, without hemorrhage or perforation: Secondary | ICD-10-CM

## 2012-04-07 HISTORY — PX: CORONARY ANGIOPLASTY WITH STENT PLACEMENT: SHX49

## 2012-04-07 HISTORY — PX: PERCUTANEOUS CORONARY STENT INTERVENTION (PCI-S): SHX5485

## 2012-04-07 HISTORY — DX: Peptic ulcer, site unspecified, unspecified as acute or chronic, without hemorrhage or perforation: K27.9

## 2012-04-07 HISTORY — DX: Atherosclerotic heart disease of native coronary artery without angina pectoris: I25.10

## 2012-04-07 HISTORY — DX: Acute pancreatitis without necrosis or infection, unspecified: K85.90

## 2012-04-07 HISTORY — DX: Obesity, unspecified: E66.9

## 2012-04-07 HISTORY — PX: LEFT HEART CATHETERIZATION WITH CORONARY ANGIOGRAM: SHX5451

## 2012-04-07 LAB — POCT ACTIVATED CLOTTING TIME: Activated Clotting Time: 361 seconds

## 2012-04-07 LAB — MRSA PCR SCREENING: MRSA by PCR: POSITIVE — AB

## 2012-04-07 LAB — CBC
HCT: 41.7 % (ref 39.0–52.0)
Hemoglobin: 15.4 g/dL (ref 13.0–17.0)
RDW: 12.5 % (ref 11.5–15.5)
WBC: 16 10*3/uL — ABNORMAL HIGH (ref 4.0–10.5)

## 2012-04-07 LAB — BASIC METABOLIC PANEL
BUN: 16 mg/dL (ref 6–23)
CO2: 25 mEq/L (ref 19–32)
Calcium: 9.3 mg/dL (ref 8.4–10.5)
GFR calc non Af Amer: >90 mL/min (ref >90)
Glucose, Bld: 296 mg/dL — ABNORMAL HIGH (ref 70–99)

## 2012-04-07 LAB — APTT: aPTT: 27 seconds (ref 24–37)

## 2012-04-07 LAB — CBC WITH DIFFERENTIAL/PLATELET
Eosinophils Absolute: 0.1 10*3/uL (ref 0.0–0.7)
Eosinophils Relative: 1 % (ref 0–5)
HCT: 44 % (ref 39.0–52.0)
Hemoglobin: 16.5 g/dL (ref 13.0–17.0)
Lymphocytes Relative: 19 % (ref 12–46)
Lymphs Abs: 2.8 10*3/uL (ref 0.7–4.0)
MCH: 32.8 pg (ref 26.0–34.0)
MCV: 87.5 fL (ref 78.0–100.0)
Monocytes Relative: 10 % (ref 3–12)
RBC: 5.03 MIL/uL (ref 4.22–5.81)
WBC: 14.6 10*3/uL — ABNORMAL HIGH (ref 4.0–10.5)

## 2012-04-07 LAB — CREATININE, SERUM
GFR calc Af Amer: >90 mL/min (ref >90)
GFR calc non Af Amer: >90 mL/min (ref >90)

## 2012-04-07 LAB — PROTIME-INR: Prothrombin Time: 12.8 seconds (ref 11.6–15.2)

## 2012-04-07 LAB — TROPONIN I: Troponin I: >20 ng/mL (ref <0.30)

## 2012-04-07 SURGERY — LEFT HEART CATHETERIZATION WITH CORONARY ANGIOGRAM
Anesthesia: LOCAL

## 2012-04-07 MED ORDER — TICAGRELOR 90 MG PO TABS
ORAL_TABLET | ORAL | Status: AC
  Filled 2012-04-07: qty 2

## 2012-04-07 MED ORDER — TICAGRELOR 90 MG PO TABS
ORAL_TABLET | ORAL | Status: AC
  Administered 2012-04-08: 90 mg via ORAL
  Filled 2012-04-07: qty 2

## 2012-04-07 MED ORDER — ACETAMINOPHEN 325 MG PO TABS
650.0000 mg | ORAL_TABLET | ORAL | Status: DC | PRN
  Administered 2012-04-07 – 2012-04-08 (×4): 650 mg via ORAL
  Filled 2012-04-07 (×4): qty 2

## 2012-04-07 MED ORDER — HEPARIN BOLUS VIA INFUSION
4000.0000 [IU] | Freq: Once | INTRAVENOUS | Status: AC
  Administered 2012-04-07: 4000 [IU] via INTRAVENOUS

## 2012-04-07 MED ORDER — NITROGLYCERIN 0.4 MG SL SUBL
SUBLINGUAL_TABLET | SUBLINGUAL | Status: AC
  Filled 2012-04-07: qty 25

## 2012-04-07 MED ORDER — HEPARIN (PORCINE) IN NACL 100-0.45 UNIT/ML-% IJ SOLN
INTRAMUSCULAR | Status: AC
  Administered 2012-04-07: 1000 [IU] via INTRAVENOUS
  Filled 2012-04-07: qty 250

## 2012-04-07 MED ORDER — MIDAZOLAM HCL 2 MG/2ML IJ SOLN
INTRAMUSCULAR | Status: AC
  Filled 2012-04-07: qty 2

## 2012-04-07 MED ORDER — MIDAZOLAM HCL 2 MG/2ML IJ SOLN
INTRAMUSCULAR | Status: AC
  Filled 2012-04-07: qty 1

## 2012-04-07 MED ORDER — CHLORHEXIDINE GLUCONATE CLOTH 2 % EX PADS
6.0000 | MEDICATED_PAD | Freq: Every day | CUTANEOUS | Status: DC
  Administered 2012-04-08 – 2012-04-09 (×2): 6 via TOPICAL

## 2012-04-07 MED ORDER — ASPIRIN 81 MG PO CHEW: 324.0000 mg | CHEWABLE_TABLET | Freq: Once | ORAL | Status: AC

## 2012-04-07 MED ORDER — ASPIRIN 81 MG PO CHEW
CHEWABLE_TABLET | ORAL | Status: AC
  Administered 2012-04-07: 324 mg
  Filled 2012-04-07: qty 4

## 2012-04-07 MED ORDER — GLIPIZIDE 10 MG PO TABS
10.0000 mg | ORAL_TABLET | Freq: Two times a day (BID) | ORAL | Status: DC
  Administered 2012-04-07 – 2012-04-09 (×4): 10 mg via ORAL
  Filled 2012-04-07 (×6): qty 1

## 2012-04-07 MED ORDER — INSULIN ASPART 100 UNIT/ML ~~LOC~~ SOLN
0.0000 [IU] | Freq: Three times a day (TID) | SUBCUTANEOUS | Status: DC
  Administered 2012-04-07 – 2012-04-08 (×4): 5 [IU] via SUBCUTANEOUS
  Administered 2012-04-09: 3 [IU] via SUBCUTANEOUS
  Administered 2012-04-09: 5 [IU] via SUBCUTANEOUS

## 2012-04-07 MED ORDER — HEPARIN (PORCINE) IN NACL 100-0.45 UNIT/ML-% IJ SOLN
1100.0000 [IU]/h | INTRAMUSCULAR | Status: DC
  Filled 2012-04-07: qty 250

## 2012-04-07 MED ORDER — MUPIROCIN 2 % EX OINT
1.0000 "application " | TOPICAL_OINTMENT | Freq: Two times a day (BID) | CUTANEOUS | Status: DC
  Administered 2012-04-07 – 2012-04-09 (×4): 1 via NASAL
  Filled 2012-04-07: qty 22

## 2012-04-07 MED ORDER — METOPROLOL TARTRATE 12.5 MG HALF TABLET
12.5000 mg | ORAL_TABLET | Freq: Two times a day (BID) | ORAL | Status: DC
  Administered 2012-04-07 – 2012-04-09 (×5): 12.5 mg via ORAL
  Filled 2012-04-07 (×7): qty 1

## 2012-04-07 MED ORDER — FENTANYL CITRATE 0.05 MG/ML IJ SOLN
INTRAMUSCULAR | Status: AC
  Filled 2012-04-07: qty 2

## 2012-04-07 MED ORDER — SODIUM CHLORIDE 0.9 % IV SOLN
1.0000 mL/kg/h | INTRAVENOUS | Status: AC
  Administered 2012-04-07: 1 mL/kg/h via INTRAVENOUS

## 2012-04-07 MED ORDER — HEPARIN SODIUM (PORCINE) 5000 UNIT/ML IJ SOLN: 60.0000 [IU]/kg | Freq: Once | INTRAMUSCULAR | Status: DC

## 2012-04-07 MED ORDER — VERAPAMIL HCL 2.5 MG/ML IV SOLN
INTRAVENOUS | Status: AC
  Filled 2012-04-07: qty 2

## 2012-04-07 MED ORDER — BIVALIRUDIN 250 MG IV SOLR
INTRAVENOUS | Status: AC
  Filled 2012-04-07: qty 250

## 2012-04-07 MED ORDER — CHLORHEXIDINE GLUCONATE CLOTH 2 % EX PADS: 6.0000 | MEDICATED_PAD | Freq: Every day | CUTANEOUS | Status: DC

## 2012-04-07 MED ORDER — LISINOPRIL 5 MG PO TABS
5.0000 mg | ORAL_TABLET | Freq: Every day | ORAL | Status: DC
  Administered 2012-04-08 – 2012-04-09 (×2): 5 mg via ORAL
  Filled 2012-04-07 (×2): qty 1

## 2012-04-07 MED ORDER — ZOLPIDEM TARTRATE 5 MG PO TABS
5.0000 mg | ORAL_TABLET | Freq: Every evening | ORAL | Status: DC | PRN
  Administered 2012-04-08: 5 mg via ORAL
  Filled 2012-04-07: qty 1

## 2012-04-07 MED ORDER — HEPARIN (PORCINE) IN NACL 2-0.9 UNIT/ML-% IJ SOLN
INTRAMUSCULAR | Status: AC
  Filled 2012-04-07: qty 1000

## 2012-04-07 MED ORDER — ALPRAZOLAM 0.25 MG PO TABS
0.2500 mg | ORAL_TABLET | Freq: Two times a day (BID) | ORAL | Status: DC | PRN
  Administered 2012-04-08: 0.25 mg via ORAL
  Filled 2012-04-07 (×2): qty 1

## 2012-04-07 MED ORDER — TICAGRELOR 90 MG PO TABS
90.0000 mg | ORAL_TABLET | Freq: Two times a day (BID) | ORAL | Status: DC
  Administered 2012-04-07 – 2012-04-09 (×3): 90 mg via ORAL
  Filled 2012-04-07 (×5): qty 1

## 2012-04-07 MED ORDER — SODIUM CHLORIDE 0.9 % IV SOLN
0.2500 mg/kg/h | INTRAVENOUS | Status: AC
  Administered 2012-04-07: 0.25 mg/kg/h via INTRAVENOUS
  Filled 2012-04-07: qty 250

## 2012-04-07 MED ORDER — ATORVASTATIN CALCIUM 80 MG PO TABS
80.0000 mg | ORAL_TABLET | Freq: Every day | ORAL | Status: DC
  Administered 2012-04-07 – 2012-04-08 (×2): 80 mg via ORAL
  Filled 2012-04-07 (×3): qty 1

## 2012-04-07 MED ORDER — ONDANSETRON HCL 4 MG/2ML IJ SOLN: 4.0000 mg | Freq: Four times a day (QID) | INTRAMUSCULAR | Status: DC | PRN

## 2012-04-07 MED ORDER — NITROGLYCERIN IN D5W 200-5 MCG/ML-% IV SOLN
INTRAVENOUS | Status: AC
  Filled 2012-04-07: qty 250

## 2012-04-07 MED ORDER — ASPIRIN 325 MG PO TABS: 325.0000 mg | ORAL_TABLET | Freq: Once | ORAL | Status: DC

## 2012-04-07 MED ORDER — ASPIRIN EC 81 MG PO TBEC
81.0000 mg | DELAYED_RELEASE_TABLET | Freq: Every day | ORAL | Status: DC
  Administered 2012-04-08 – 2012-04-09 (×2): 81 mg via ORAL
  Filled 2012-04-07 (×2): qty 1

## 2012-04-07 MED ORDER — NITROGLYCERIN 0.4 MG SL SUBL
0.4000 mg | SUBLINGUAL_TABLET | SUBLINGUAL | Status: DC | PRN
  Administered 2012-04-08 (×2): 0.4 mg via SUBLINGUAL
  Filled 2012-04-07: qty 25

## 2012-04-07 MED ORDER — PANTOPRAZOLE SODIUM 40 MG PO TBEC
40.0000 mg | DELAYED_RELEASE_TABLET | Freq: Two times a day (BID) | ORAL | Status: DC
  Administered 2012-04-07 – 2012-04-09 (×4): 40 mg via ORAL
  Filled 2012-04-07 (×3): qty 1

## 2012-04-07 NOTE — ED Notes (Signed)
CRITICAL VALUE ALERT  Critical value received:  Troponin 10.02  Date of notification:  04/04/12  Time of notification:  1129  Critical value read back:yes  Nurse who received alert:  c Bari Leib  MD notified (1st page):  Marvetta Gibbons RN cath lab  Time of first page:  1130am   MD notified (2nd page):  Time of second page:  Responding MD:  Dimas Alexandria RN  Time MD responded:  1130am

## 2012-04-07 NOTE — ED Notes (Signed)
Called [facility] to transport to Cath Lab

## 2012-04-07 NOTE — H&P (Signed)
Patient ID: Juan Ray MRN: [id number], DOB/AGE: [birth year]   Admit date: 04/07/2012   Primary Physician: Leo Grosser, MD Primary Cardiologist: New - lives in [facility]  Pt. Profile:  17 -year-old male without prior history of CAD who presents with acute inferolateral ST elevation MI.  Problem List  Past Medical History  Diagnosis Date  . Diabetes mellitus   . High cholesterol   . Hypertension   . Pancreatitis     a. mild by CT 06/2011  . Obesity   . Peptic ulcer disease     a. 06/2009 EGD: multiple gastric and duodenal ulcers.    Past Surgical History  Procedure Laterality Date  . Esophagogastroduodenoscopy  06/18/2011    NL ESOPHAGUS/UlcerATED LESIONS/ SUPERFICIAL Ulcers     Allergies  No Known Allergies  HPI  52 year old male without prior history of coronary artery disease. In June of 2013, he was hospitalized with epigastric abdominal pain subsequently diagnosed with gastric and duodenal ulcers as well as mild pancreatitis. This was managed with PPI therapy. Is done well over the subsequent months however this morning he awoke at approximately 2:30 AM with severe 10 out 10 chest pressure and heaviness without associated symptoms. The symptoms persisted, he [facility] ED where ECG showed inferior and lateral ST elevation of approximately 1 mm with anterior reciprocal changes. Blood work was drawn a code STEMI was called. Patient was then taken emerg[facility] to [facility] and is currently undergoing emergent diagnostic catheterization. Upon arrival to [facility], he continued to have 4/10 chest pain and was hemodynamically stable. His troponin returned at 10.  Home Medications  Prior to Admission medications   Medication Sig Start Date End Date Taking? Authorizing Provider  glipiZIDE (GLUCOTROL) 10 MG tablet Take 10 mg by mouth 2 (two) times daily before a meal.   Yes Historical Provider, MD  lisinopril (PRINIVIL,ZESTRIL) 5 MG tablet  Take 5 mg by mouth daily.   Yes Historical Provider, MD  metFORMIN (GLUCOPHAGE) 1000 MG tablet Take 1,000 mg by mouth 2 (two) times daily with a meal.   Yes Historical Provider, MD  pantoprazole (PROTONIX) 40 MG tablet Take 1 tablet (40 mg total) by mouth 2 (two) times daily. 06/19/11 06/18/12 Yes Nimish Normajean Glasgow, MD  pravastatin (PRAVACHOL) 20 MG tablet Take 20 mg by mouth daily.   Yes Historical Provider, MD   Family History  Family History  Problem Relation Age of Onset  . Heart attack Father     MI x 2 in late 50's, currently 19's   Social History  History   Social History  . Marital Status: Married    Spouse Name: N/A    Number of Children: N/A  . Years of Education: N/A   Occupational History  . Not on file.   Social History Main Topics  . Smoking status: Never Smoker   . Smokeless tobacco: Not on file  . Alcohol Use: No  . Drug Use: No  . Sexually Active: Yes   Other Topics Concern  . Not on file   Social History Narrative   Lives in [place], with his wife and 60 yr old son.     Review of Systems General:  No chills, fever, night sweats or weight changes.  Cardiovascular:  +++ chest pain.  No dyspnea on exertion, edema, orthopnea, palpitations, paroxysmal nocturnal dyspnea. Dermatological: No rash, lesions/masses Respiratory: No cough, dyspnea Urologic: No hematuria, dysuria Abdominal:   No nausea, vomiting, diarrhea, bright red blood  per rectum, melena, or hematemesis Neurologic:  No visual changes, wkns, changes in mental status. All other systems reviewed and are otherwise negative except as noted above.  Physical Exam  Blood pressure 150/82, pulse 90, temperature 98.1 F (36.7 C), temperature source Oral, resp. rate 17, weight 259 lb (117.482 kg), SpO2 99.00%.  General: Pleasant, NAD Psych: Normal affect. Neuro: Alert and oriented X 3. Moves all extremities spontaneously. HEENT: Normal  Neck: Supple without bruits or JVD. Lungs:  Resp regular and  unlabored, CTA. Heart: RRR no s3, s4, or murmurs. Abdomen: Soft, non-tender, non-distended, BS + x 4.  Extremities: No clubbing, cyanosis or edema. DP/PT/Radials 2+ and equal bilaterally.  Labs   Recent Labs  04/07/12 1043  TROPONINI 10.02*   Lab Results  Component Value Date   WBC 14.6* 04/07/2012   HGB 16.5 04/07/2012   HCT 44.0 04/07/2012   MCV 87.5 04/07/2012   PLT 239 04/07/2012    Recent Labs Lab 04/07/12 1043  NA 135  K 3.5  CL 97  CO2 25  BUN 16  CREATININE 0.79  CALCIUM 9.3  GLUCOSE 296*   Lab Results  Component Value Date   CHOL 254* 06/19/2011   HDL 34* 06/19/2011   LDLCALC 154* 06/19/2011   TRIG 332* 06/19/2011   Radiology/Studies  Dg Chest Portable 1 View  04/07/2012  *RADIOLOGY REPORT*  Clinical Data: Chest pain for 1 day  PORTABLE CHEST - 1 VIEW  Comparison: 11/17/2011  Findings: Cardiac shadow is mildly enlarged.  The lungs are clear. No focal infiltrate is noted.  The osseous structures are within normal limits.  IMPRESSION: No acute abnormality noted.   Original Report Authenticated By: Alcide Clever, M.D.    ECG  Rsr, 78, 1mm inflat ST elevation with ant ST depression.  ASSESSMENT AND PLAN  1. Acute inferolateral ST segment elevation myocardial infarction/CAD: Patient presented with acute onset of chest pain during the night has since been found to have inferolateral ST segment elevation with anterior reciprocal changes. He is undergoing emergent catheterization which up to this point has revealed a chronic total occlusion of the proximal right coronary artery and a fresh occlusion of the left circumflex. PCI is ongoing. Plan to add aspirin, P2Y12 inhibitor, hypodensity statin, and beta blocker as blood pressure/heart rate allow. Plan eventual cardiac rehabilitation.    2. Hypertension: Stable. Follow.  3. Hyperlipidemia: We'll check lipids and LFTs. Continue statin.  4. Diabetes mellitus: Hold metformin. Add sliding scale insulin.  5. Gastric and duodenal  ulcers: Continue PPI.   Signed, Nicolasa Ducking, NP 04/07/2012, 12:13 PM  Patient seen, examined. Available data reviewed. Agree with findings, assessment, and plan as outlined by Ward Givens, NP. The patient presents with an acute inferoposterior and lateral MI. He's had ongoing symptoms now for approximately 10 hours. He continues to have 4/10 chest pain. His EKG shows acute injury current. I have reviewed the risks, indications, and alternatives to cardiac catheterization and possible PCI. Emergency implied consent was obtained. Further plans pending cardiac catheterization results. Exam reveals a 52 year old gentleman in mild distress secondary to pain. Heart is regular rate and rhythm without murmur, gallop, or rub. Lungs are clear. There is no peripheral edema. The abdomen is obese without tenderness. There are no carotid bruits. Initial troponin is 10.02. Other labs are unremarkable. EKG as above with acute inferolateral ST elevation and ST depression suggestive of posterior injury.  Tonny Bollman, M.D. 04/07/2012 12:44 PM

## 2012-04-07 NOTE — Telephone Encounter (Signed)
NTBS.

## 2012-04-07 NOTE — Progress Notes (Signed)
Chaplain was informed by ED Chaplain that pt on the way to cath lab. Met pt as he arrived at cath lab and informed him (via nurse) that I would meet pt's wife when she arrived. Found pt's wife in Short Stay waiting area. Informed her of cath lab procedures and what to expect. She didn't need me to stay; more family is coming. Will check back on them later.

## 2012-04-07 NOTE — ED Notes (Addendum)
Pt complains of sharp chest pain that started around 2a. Pain is in the center and radiates to the right. Denies n/v/sob. Wife stated pt has been complaining of arm.

## 2012-04-07 NOTE — Interval H&P Note (Signed)
History and Physical Interval Note:  04/07/2012 12:47 PM  Juan Ray  has presented today for surgery, with the diagnosis of cp  The various methods of treatment have been discussed with the patient and family. After consideration of risks, benefits and other options for treatment, the patient has consented to  Procedure(s): LEFT HEART CATHETERIZATION WITH CORONARY ANGIOGRAM (N/A) PERCUTANEOUS CORONARY STENT INTERVENTION (PCI-S) as a surgical intervention .  The patient's history has been reviewed, patient examined, no change in status, stable for surgery.  I have reviewed the patient's chart and labs.  Questions were answered to the patient's satisfaction.     Tonny Bollman

## 2012-04-07 NOTE — Care Management Note (Addendum)
    Page 1 of 1   04/09/2012     2:19:02 PM   CARE MANAGEMENT NOTE 04/09/2012  Patient:  [facility]   Account Number:  [id number]  Date Initiated:  04/07/2012  Documentation initiated by:  Junius Creamer  Subjective/Objective Assessment:   adm w mi     Action/Plan:   lives w wife, pcp dr Broadus John pickard   Anticipated DC Date:  04/09/2012   Anticipated DC Plan:  HOME/SELF CARE      DC Planning Services  CM consult  Medication Assistance      Choice offered to / List presented to:             Status of service:  Completed, signed off Medicare Important Message given?   (If response is "NO", the following Medicare IM given date fields will be blank) Date Medicare IM given:   Date Additional Medicare IM given:    Discharge Disposition:  HOME/SELF CARE  Per UR Regulation:  Reviewed for med. necessity/level of care/duration of stay  If discussed at Long Length of Stay Meetings, dates discussed:    Comments:  04/09/12- 1400- Donn Pierini RN, BSN [phone] Pt was discharged on Plavix instead of Brilinta- on prior auth needed.  4/8 1518 debbie dowell rn,bsn pt need prior auth from mediciad for brilinta. have printed off form and placed in shadow chart for md to sign for prior auth for brilinta. showed form to wife to follow up on also.  4/7 1418 debbie dowell rn,bsn gave pt and wife brilinta 30day free card and copay assist card.

## 2012-04-07 NOTE — Progress Notes (Signed)
Nurse called to room due to chest pain. Patient described the pain as an electric impulse that traveled up his mid sternal area. He stated that the pain only lasted a minute. Obtained a 12 lead EKG and compared to previous post cath EKG, no changes were noted. Patient is chest pain free but does state he has a little headache 3/10.  He was medicated with tylenol earlier this evening. I asked him to let me know if he had another episode of chest pain. He stated that he would.

## 2012-04-07 NOTE — Telephone Encounter (Signed)
Pt went to ER for MI

## 2012-04-07 NOTE — ED Notes (Signed)
Called [facility] [employer] to [employer]

## 2012-04-07 NOTE — ED Provider Notes (Signed)
History  This chart was scribed for Juan Jakes, MD by Bennett Scrape, ED Scribe. This patient was seen in room APA14/APA14 and the patient's care was started at 10:42 AM.  CSN: [id number]  Arrival date & time 04/07/12  1031   First MD Initiated Contact with Patient 04/07/12 1042      Chief Complaint  Patient presents with  . Chest Pain     Patient is a 52 y.o. male presenting with chest pain. The history is provided by the patient. No language interpreter was used.  Chest Pain Pain location:  Substernal area Pain quality: sharp   Pain radiates to:  Does not radiate Pain radiates to the back: no   Onset quality:  Gradual Duration:  9 hours Timing:  Constant Progression:  Worsening Chronicity:  New Relieved by:  Nothing Worsened by:  Nothing tried Ineffective treatments:  None tried Associated symptoms: no abdominal pain, no diaphoresis, no nausea, no shortness of breath and not vomiting   Risk factors: diabetes mellitus, high cholesterol and hypertension   Risk factors: no smoking     Juan Ray is a 52 y.o. male who presents to the Emergency Department complaining of approximately 9 hours of right substernal CP described as sharp with associated right arm pain. He denies that the pain is radiating but reports that his chest is making his arm hurt. He rates his pain an 8.5 out of 10 currently. He denies taking OTC medications at home to improve symptoms. He reports prior episodes of mildly similar symptoms diagnosed as pancreatitis but states that the pain is located more in the abdomen with these episodes. He reports that he used to take ASA but was taken off after he was told that he had ulcers. He denies nausea, emesis, diaphoresis and SOB as associated symptoms. He has a h.o DM, HTN and HLD. He denies smoking and alcohol use.   Past Medical History  Diagnosis Date  . Diabetes mellitus   . High cholesterol   . Hypertension   . Pancreatitis     Past Surgical  History  Procedure Laterality Date  . Esophagogastroduodenoscopy  06/18/2011    NL ESOPHAGUS/UlcerATED LESIONS/ SUPERFICIAL Ulcers    No family history on file.  History  Substance Use Topics  . Smoking status: Never Smoker   . Smokeless tobacco: Not on file  . Alcohol Use: No      Review of Systems  Constitutional: Negative for diaphoresis.  Respiratory: Negative for shortness of breath.   Cardiovascular: Positive for chest pain.  Gastrointestinal: Negative for nausea, vomiting, abdominal pain and diarrhea.  Hematological: Does not bruise/bleed easily.  Psychiatric/Behavioral: Negative for confusion.  All other systems reviewed and are negative.    Allergies  Review of patient's allergies indicates no known allergies.  Home Medications   Current Outpatient Rx  Name  Route  Sig  Dispense  Refill  . glipiZIDE (GLUCOTROL) 10 MG tablet   Oral   Take 10 mg by mouth 2 (two) times daily before a meal.         . lisinopril (PRINIVIL,ZESTRIL) 5 MG tablet   Oral   Take 5 mg by mouth daily.         . metFORMIN (GLUCOPHAGE) 1000 MG tablet   Oral   Take 1,000 mg by mouth 2 (two) times daily with a meal.         . pantoprazole (PROTONIX) 40 MG tablet   Oral   Take 1 tablet (40 mg total)  by mouth 2 (two) times daily.   60 tablet   0   . pravastatin (PRAVACHOL) 20 MG tablet   Oral   Take 20 mg by mouth daily.           Triage Vitals: BP 150/82  Pulse 90  Temp(Src) 98.1 F (36.7 C) (Oral)  Resp 17  SpO2 99%  Physical Exam  Nursing note and vitals reviewed. Constitutional: He is oriented to person, place, and time. He appears well-developed and well-nourished. No distress.  HENT:  Head: Normocephalic and atraumatic.  Mouth/Throat: Oropharynx is clear and moist.  Eyes: Conjunctivae and EOM are normal. Pupils are equal, round, and reactive to light.  Neck: Neck supple. No tracheal deviation present.  Cardiovascular: Normal rate and regular rhythm.   No  murmur heard. Pulmonary/Chest: Effort normal and breath sounds normal. No respiratory distress. He has no wheezes. He exhibits no tenderness.  Abdominal: Soft. Bowel sounds are normal. He exhibits no distension. There is no tenderness.  Musculoskeletal: Normal range of motion. He exhibits no edema (no ankle swelling).  Lymphadenopathy:    He has no cervical adenopathy.  Neurological: He is alert and oriented to person, place, and time. No cranial nerve deficit.  Pt able to move both sets of fingers and toes  Skin: Skin is warm and dry. No rash noted.  Psychiatric: He has a normal mood and affect. His behavior is normal.    ED Course  Procedures (including critical care time)  DIAGNOSTIC STUDIES: Oxygen Saturation is 99% on room air, normal by my interpretation.    COORDINATION OF CARE: 10:45 AM-informed pt of abnormal EKG results. Discussed treatment plan which includes transfer to [facility], IV heparin and SL nitro with pt at bedside and pt agreed to plan.   10:46 AM-Code STEMI called  10:56 AM-Consult complete with Dr. Excell Seltzer, Cardiologist. Patient case explained and discussed. Dr. Excell Seltzer agrees to admit patient to [facility] for further evaluation and treatment. Will have pt go to cath lab per EMS.  Call ended at 10:56 AM.   10:57 PM-Pt rechecked and rates his pain a 6 out of 10 with heparin drip and SL nitro.  Labs Reviewed  TROPONIN I  CBC WITH DIFFERENTIAL  BASIC METABOLIC PANEL  APTT  PROTIME-INR   No results found.  Date: 04/07/2012  Rate: 78  Rhythm: normal sinus rhythm and sinus arrhythmia  QRS Axis: normal  Intervals: normal  ST/T Wave abnormalities: ST elevations inferiorly and ST depressions anteriorly  Conduction Disutrbances:none  Narrative Interpretation:   Old EKG Reviewed: none available EKG is consistent with a inferior posterior infarct acute STEMI.   1. STEMI (ST elevation myocardial infarction)     CRITICAL CARE Performed by: Juan Ray.   Total  critical care time: 30  Critical care time was exclusive of separately billable procedures and treating other patients.  Critical care was necessary to treat or prevent imminent or life-threatening deterioration.  Critical care was time spent personally by me on the following activities: development of treatment plan with patient and/or surrogate as well as nursing, discussions with consultants, evaluation of patient's response to treatment, examination of patient, obtaining history from patient or surrogate, ordering and performing treatments and interventions, ordering and review of laboratory studies, ordering and review of radiographic studies, pulse oximetry and re-evaluation of patient's condition.   MDM   Patient with onset of chest pain substernal to the right radiating to right arm starting at about 2:30 in the morning. Pain is been constant since  it would wax and wane but never resolved. Patient has cardiac risk factors to include diabetes hypertension high cholesterol. Never had pain exactly like this before. Had something a little similar with as pancreatitis but this is a higher location. Today's EKG consistent with an acute STEMI inferior posterior infarct could; Dr. Excell Seltzer accepting will be transported by rocking [facility] [place] [facility] to the Cath Lab down to [place]. Here in the emergency department patient after assessment received aspirin sublingual nitroglycerin and started a heparin bolus. Patient's pain was originally 8 point 5/10 now down to 6/10 and decreasing more.      I personally performed the services described in this documentation, which was scribed in my presence. The recorded information has been reviewed and is accurate.     Juan Jakes, MD 04/07/12 [phone]

## 2012-04-07 NOTE — CV Procedure (Signed)
   Cardiac Catheterization Procedure Note  Name: Juan Ray MRN: [id number] DOB: [birth year]  Procedure: Left Heart Cath, Selective Coronary Angiography, LV angiography, PTCA and stenting of the Left Circumflex  Indication: Inferoposterior STEMI  Procedural Details:  The right wrist was prepped, draped, and anesthetized with 1% lidocaine. Using the modified Seldinger technique, a 6 French sheath was introduced into the right radial artery. 3 mg of verapamil was administered through the sheath, weight-based unfractionated heparin was administered intravenously. Standard Judkins catheters were used for selective coronary angiography and left ventriculography. Catheter exchanges were performed over an exchange length guidewire.  PROCEDURAL FINDINGS Hemodynamics: AO 101/68 LV 105/35   Coronary angiography: Coronary dominance: right  Left mainstem: Widely patent without obstructive disease  Left anterior descending (LAD): The LAD is diffusely diseased. There is 70% stenosis of the proximal LAD. The diagonal branches arising from this segment are small with 70-80% ostial stenosis. The mid LAD is patent with minor nonobstructive disease. The junction of the mid and distal LAD there is another 70% stenosis present. The LAD wraps around the left ventricular apex.  Left circumflex (LCx): The left circumflex is large in caliber. The first obtuse marginal branch is medium in caliber. The first OM has an 80% proximal stenosis. The mid circumflex just beyond the OM is totally occluded. The second OM fills late.  Right coronary artery (RCA): The right coronary artery is occluded. The vessel is moderate in caliber. It occludes just after the first RV branch. There is late antegrade filling as well as distal filling from left to right collaterals.  Left ventriculography: There is severe hypokinesis of the inferior wall. The left ventricular ejection fraction is estimated at 45%. There is no significant  mitral regurgitation.  PCI Note:  Following the diagnostic procedure, the decision was made to proceed with PCI. The patient was loaded with brilinta 180 mg. He had already received unfractionated heparin. Weight-based bivalirudin was given for anticoagulation. Once a therapeutic ACT was achieved, a 6 [place] XB LAD guide catheter was inserted.  A prolonged coronary guidewire was used to cross the lesion.  The lesion was predilated with a 2.5 x 12 mm balloon.  The lesion was then stented with a 4.0 x 24 mm proneness elements drug-eluting stent.  The stent was postdilated with a 4.0 x 20 mm noncompliant balloon.  Following PCI, there was 0% residual stenosis and TIMI-3 flow. Final angiography confirmed an excellent result. The patient tolerated the procedure well. There were no immediate procedural complications. A TR band was used for radial hemostasis. The patient was transferred to the post catheterization recovery area for further monitoring.  PCI Data: Vessel - left circumflex/Segment - mid Percent Stenosis (pre)  100 TIMI-flow 0 Stent 4.0 x 24 mm drug-eluting Percent Stenosis (post) 0 TIMI-flow (post) 3  Final Conclusions:   1. Acute inferoposterior myocardial infarction secondary to total occlusion left circumflex, treated successfully with primary PCI. 2. Three-vessel coronary artery disease with chronic total occlusion of the right coronary artery, acute occlusion of the left circumflex, and moderate to severe stenosis of the LAD 3. Moderate LV systolic dysfunction with an estimated left ventricular ejection fraction 45%   Recommendations:  Aggressive medical therapy and risk reduction. Depending on the patient's post MI symptoms, consider outpatient stress testing once he has recovered from his MI for further risk stratification. He will need dual antiplatelet therapy with aspirin and brilinta for at least 12 months if tolerated.  Tonny Bollman 04/07/2012, 12:56 PM

## 2012-04-08 DIAGNOSIS — I219 Acute myocardial infarction, unspecified: Secondary | ICD-10-CM

## 2012-04-08 LAB — GLUCOSE, CAPILLARY
Glucose-Capillary: 241 mg/dL — ABNORMAL HIGH (ref 70–99)
Glucose-Capillary: 206 mg/dL — ABNORMAL HIGH (ref 70–99)

## 2012-04-08 LAB — COMPREHENSIVE METABOLIC PANEL
AST: 244 U/L — ABNORMAL HIGH (ref 0–37)
CO2: 26 mEq/L (ref 19–32)
Calcium: 8.7 mg/dL (ref 8.4–10.5)
Creatinine, Ser: 0.83 mg/dL (ref 0.50–1.35)
GFR calc Af Amer: >90 mL/min (ref >90)
GFR calc non Af Amer: >90 mL/min (ref >90)

## 2012-04-08 LAB — CBC
Platelets: 196 10*3/uL (ref 150–400)
RBC: 4.42 MIL/uL (ref 4.22–5.81)
WBC: 15.8 10*3/uL — ABNORMAL HIGH (ref 4.0–10.5)

## 2012-04-08 LAB — LIPID PANEL
Cholesterol: 196 mg/dL (ref 0–200)
HDL: 29 mg/dL — ABNORMAL LOW (ref >39)
Total CHOL/HDL Ratio: 6.8 RATIO

## 2012-04-08 LAB — TROPONIN I: Troponin I: >20 ng/mL (ref <0.30)

## 2012-04-08 MED ORDER — ALPRAZOLAM 0.25 MG PO TABS
0.2500 mg | ORAL_TABLET | Freq: Three times a day (TID) | ORAL | Status: DC | PRN
  Administered 2012-04-08: 0.25 mg via ORAL

## 2012-04-08 MED ORDER — ALUM & MAG HYDROXIDE-SIMETH 200-200-20 MG/5ML PO SUSP
30.0000 mL | ORAL | Status: DC | PRN
Start: 2012-04-08 — End: 2012-04-09
  Administered 2012-04-08: 30 mL via ORAL
  Filled 2012-04-08: qty 30

## 2012-04-08 MED FILL — Sodium Chloride IV Soln 0.9%: INTRAVENOUS | Qty: 50 | Status: AC

## 2012-04-08 NOTE — Progress Notes (Signed)
Inpatient Diabetes Program Recommendations  AACE/ADA: New Consensus Statement on Inpatient Glycemic Control (2013)  Target Ranges:  Prepandial:   less than 140 mg/dL      Peak postprandial:   less than 180 mg/dL (1-2 hours)      Critically ill patients:  140 - 180 mg/dL  Results for JAVEN, HINDERLITER (MRN [id number]) as of 04/08/2012 14:06  Ref. Range 04/07/2012 13:02 04/07/2012 16:17 04/07/2012 21:18 04/08/2012 07:36 04/08/2012 11:50  Glucose-Capillary Latest Range: 70-99 mg/dL 578 (H) 469 (H) 629 (H) 204 (H) 236 (H)   Inpatient Diabetes Program Recommendations Insulin - Basal: Start Levemir 15 units  Correction (SSI): add HS scale HgbA1C: order to assess prehospital glucose control  Thank you  Piedad Climes BSN, RN,CDE Inpatient Diabetes Coordinator [phone] (team pager)

## 2012-04-08 NOTE — Progress Notes (Signed)
1012 Talked with pt's RN. Pt just had CP and had to receive NTG. Resting now. Will follow up later. Melody Cirrincione DunlapRNBSN

## 2012-04-08 NOTE — Progress Notes (Signed)
CARDIAC REHAB PHASE I   PRE:  Rate/Rhythm: 102ST  BP:  Supine: 121/63  Sitting:   Standing:    SaO2: 96%RA  MODE:  Ambulation: 520 ft   POST:  Rate/Rhythm: 120ST  BP:  Supine:   Sitting:   Standing:  dinamapp would not take BP. Tried several times   SaO2: 95%RA 1408-1505 Pt walked 520 ft on RA with steady gait. Tolerated well. Denied CP. Education completed with pt and wife except for exercise and Phase 2. Gave stent booklets and brilinta booklet. Pt's wife stated she needed precert. For brilinta with her pharmacy. Told case manager who said she would check on it. Pt and wife voiced understanding of ed. Will follow up tomorrow.    Luetta Nutting, RN BSN  04/08/2012 3:00 PM

## 2012-04-08 NOTE — Progress Notes (Signed)
Pt resting quietly with eyes closed no further chest pain

## 2012-04-08 NOTE — Progress Notes (Signed)
Complains of upper chest pain states more in throat area. Nitro SL given x2 no relief stat EKG. Christain [place] NP notified and EKG shown to him. Will hold on transfer at this time.

## 2012-04-08 NOTE — Progress Notes (Signed)
    Subjective:  No chest pain or dyspnea. Feels well this morning.  Objective:  Vital Signs in the last 24 hours: Temp:  [97.4 F (36.3 C)-99.7 F (37.6 C)] 99.7 F (37.6 C) (04/08 0737) Pulse Rate:  [63-90] 89 (04/08 0737) Resp:  [10-21] 19 (04/08 0737) BP: (93-150)/(59-84) 124/69 mmHg (04/08 0800) SpO2:  [93 %-99 %] 93 % (04/08 0737) Weight:  [117.482 kg (259 lb)-122.1 kg (269 lb 2.9 oz)] 122.1 kg (269 lb 2.9 oz) (04/08 0500)  Intake/Output from previous day: 04/07 0701 - 04/08 0700 In: 1834.2 [P.O.:810; I.V.:1024.2] Out: 1250 [Urine:1250]  Physical Exam: Pt is alert and oriented, overweight male in NAD HEENT: normal Neck: JVP - normal, carotids 2+= without bruits Lungs: CTA bilaterally CV: RRR without murmur or gallop Abd: soft, NT, Positive BS, no hepatomegaly Ext: no C/C/E, distal pulses intact and equal, right radial site clear Skin: warm/dry no rash   Lab Results:  Recent Labs  04/07/12 1512 04/08/12 0445  WBC 16.0* 15.8*  HGB 15.4 14.2  PLT 204 196    Recent Labs  04/07/12 1043 04/07/12 1512 04/08/12 0445  NA 135  --  135  K 3.5  --  3.7  CL 97  --  99  CO2 25  --  26  GLUCOSE 296*  --  223*  BUN 16  --  14  CREATININE 0.79 0.68 0.83    Recent Labs  04/07/12 1819 04/08/12 0011  TROPONINI >20.00* >20.00*   Tele: Sinus rhythm with episodes of sinus bradycardia.  Assessment/Plan:  1. Acute inferoposterior myocardial infarction. The patient had acute occlusion of his left circumflex, treated successfully with drug-eluting stent implantation. He is clinically and hemodynamically stable. He should remain on dual antiplatelet therapy with aspirin and brilinta for at least 12 months. Will plan on outpatient stress Myoview in approximately 4 weeks as he recovers from his myocardial infarction. He does have multivessel disease noted. He is on an ACE inhibitor, beta blocker, and high intensity statin drug. We discussed risk modification with focus on  weight loss and participation in phase II cardiac rehabilitation. Will plan on transfer to telemetry today.  2. Diabetes, type II. Currently on glipizide and sliding scale insulin. Will resume his home medications at discharge.  3. Hyperlipidemia. LDL was 115, previously 154. Triglycerides was 262 total cholesterol 196. He is now on high-dose atorvastatin.  4. Hypertension. Continue beta blocker and ACE inhibitor.  5. Disposition: Transfer to telemetry today. Anticipate discharge home tomorrow.   Tonny Bollman, M.D. 04/08/2012, 8:48 AM

## 2012-04-09 ENCOUNTER — Encounter: Payer: Self-pay | Admitting: *Deleted

## 2012-04-09 ENCOUNTER — Encounter (HOSPITAL_COMMUNITY): Payer: Self-pay | Admitting: Physician Assistant

## 2012-04-09 DIAGNOSIS — I2119 ST elevation (STEMI) myocardial infarction involving other coronary artery of inferior wall: Secondary | ICD-10-CM

## 2012-04-09 DIAGNOSIS — I2510 Atherosclerotic heart disease of native coronary artery without angina pectoris: Secondary | ICD-10-CM

## 2012-04-09 DIAGNOSIS — K279 Peptic ulcer, site unspecified, unspecified as acute or chronic, without hemorrhage or perforation: Secondary | ICD-10-CM

## 2012-04-09 MED ORDER — METOPROLOL TARTRATE 12.5 MG HALF TABLET: 12.5000 mg | ORAL_TABLET | Freq: Two times a day (BID) | ORAL | Status: DC

## 2012-04-09 MED ORDER — ASPIRIN 81 MG PO TBEC: 81.0000 mg | DELAYED_RELEASE_TABLET | Freq: Every day | ORAL | Status: AC

## 2012-04-09 MED ORDER — CLOPIDOGREL BISULFATE 75 MG PO TABS: 75.0000 mg | ORAL_TABLET | Freq: Every day | ORAL | Status: DC

## 2012-04-09 MED ORDER — CLOPIDOGREL BISULFATE 75 MG PO TABS
300.0000 mg | ORAL_TABLET | Freq: Once | ORAL | Status: AC
  Administered 2012-04-09: 300 mg via ORAL
  Filled 2012-04-09: qty 4

## 2012-04-09 MED ORDER — NITROGLYCERIN 0.4 MG SL SUBL: 0.4000 mg | SUBLINGUAL_TABLET | SUBLINGUAL | Status: DC | PRN

## 2012-04-09 MED ORDER — ATORVASTATIN CALCIUM 80 MG PO TABS: 80.0000 mg | ORAL_TABLET | Freq: Every day | ORAL | Status: DC

## 2012-04-09 NOTE — Progress Notes (Signed)
CARDIAC REHAB PHASE I   PRE:  Rate/Rhythm: 107ST  BP:  Supine:   Sitting: 118/80  Standing:    SaO2:   MODE:  Ambulation: 550 ft   POST:  Rate/Rhythm: 113 ST  BP:  Supine:   Sitting: 118/80  Standing:    SaO2:  1022-1047 Pt walked 550 ft with steady gait. Tolerated well. No Cp. Education completed with pt and wife. Permission given to refer to [facility] Phase 2.   Luetta Nutting, RN BSN  04/09/2012 10:45 AM

## 2012-04-09 NOTE — Discharge Summary (Signed)
Discharge Summary   Patient ID: Juan Ray,  MRN: [id number], DOB/AGE: 1960-03-25 52 y.o.  Admit date: 04/07/2012 Discharge date: 04/09/2012  Primary Physician: Leo Grosser, MD Primary Cardiologist: Judie Petit. Excell Seltzer, MD  Discharge Diagnoses Principal Problem:   ST elevation myocardial infarction (STEMI) of inferoposterior wall  - Diffuse CAD (see below)  - S/p DES-mid LCx  - ASA/Plavix x 12 months Active Problems:   CAD (coronary artery disease), native coronary artery  - To be discharged on ASA/Plavix/ACEi/BB/statin/NTG SL PRN   HTN (hypertension)   DM (diabetes mellitus)  - To resume home oral hypoglycemics   Hyperlipidemia  - Pravachol replaced with Lipitor for higher potency   PUD (peptic ulcer disease)  - Continued on PPI  Allergies No Known Allergies  Diagnostic Studies/Procedures  PORTABLE CHEST X-RAY - 04/07/12  IMPRESSION:  No acute abnormality noted.  CARDIAC CATHETERIZATION + PERCUTANEOUS CORONARY INTERVENTION - 04/07/12  Hemodynamics:  AO 101/68  LV 105/35  Coronary angiography:  Coronary dominance: right  Left mainstem: Widely patent without obstructive disease  Left anterior descending (LAD): The LAD is diffusely diseased. There is 70% stenosis of the proximal LAD. The diagonal branches arising from this segment are small with 70-80% ostial stenosis. The mid LAD is patent with minor nonobstructive disease. The junction of the mid and distal LAD there is another 70% stenosis present. The LAD wraps around the left ventricular apex.  Left circumflex (LCx): The left circumflex is large in caliber. The first obtuse marginal branch is medium in caliber. The first OM has an 80% proximal stenosis. The mid circumflex just beyond the OM is totally occluded. The second OM fills late.  Right coronary artery (RCA): The right coronary artery is occluded. The vessel is moderate in caliber. It occludes just after the first RV branch. There is late antegrade filling as  well as distal filling from left to right collaterals.  Left ventriculography: There is severe hypokinesis of the inferior wall. The left ventricular ejection fraction is estimated at 45%. There is no significant mitral regurgitation.   PCI Data:  Vessel - left circumflex/Segment - mid  Percent Stenosis (pre) 100  TIMI-flow 0  Stent 4.0 x 24 mm drug-eluting  Percent Stenosis (post) 0  TIMI-flow (post) 3   Final Conclusions:  1. Acute inferoposterior myocardial infarction secondary to total occlusion left circumflex, treated successfully with primary PCI.  2. Three-vessel coronary artery disease with chronic total occlusion of the right coronary artery, acute occlusion of the left circumflex, and moderate to severe stenosis of the LAD  3. Moderate LV systolic dysfunction with an estimated left ventricular ejection fraction 45%   History of Present Illness Juan Ray is a 52 y.o. male with no prior cardiac history who was admitted to [facility] on 04/07/12 with the above problem list.   He has a prior history of duodenal ulcers and mild pancreatitis. He was started on PPI therapy with improvement. He awaoke the date of admission ~ 2:30 AM with severe 10/10 substernal chest pressure and heaviness without associated symptoms. The discomfort persisted, thus prompting him to present to the [facility] ED. There, an EKG revealed inferolateral ST elevation ~ 1 mm with anterior reciprocal changes. Blood work was drawn and code STEMI activated. He was transported emergently to the [facility] cardiac catheterization lab. Upon arrival, he continued to have 4/10 chest pain and was hemodynamically stable. Troponin returned elevated at 10.   As above, cath revealed diffuse CAD including 70% prox LAD, 70-80% ostial  diagonal, 70% mid-distal LAD, 80% prox OM1, mid LCx totally occluded just distal to OM1, occluded RCA; severe inferior HK, LVEF 45%. He was loaded with Brilinta and a DES was placed to the  mid LCx with good result. He tolerated the procedure well and the recommendation was made to pursue aggressive medical therapy and risk reduction. Close observation for persistent ischemic signs/symptoms post-cath was also suggested given his residual CAD. DAPT- ASA/Brilinta x 12 months was initially recommended.   Hospital Course   He was transferred to CCU in good condition. He was started on Lopressor and high-dose Lipitor. Risk factor modification including weight loss and glycemic control was stressed. Lipid panel returned revealing LDL 115, TG 262, TC 196. Cardiac markers did return markedly elevated overnight. He denied any recurrent discomfort. He ambulated well with cardiac rehab and was transferred to telemetry for fast track post STEMI discharge. This AM, he did have an episode of breathlessness believed to be attributed to a side effect from Brilinta. EKG was obtained which did reveal new changes, but felt to represent progression of STEMI.   He was evaluated by Dr. Excell Seltzer this AM and deemed stable for discharge. Brilinta will be discontinued due to shortness of breath and Plavix will be started. He will receive a loading dose today, and then daily dosing tomorrow. He will resume the medication regimen outlined below otherwise. He will on continue his outpatient oral hypoglycemics and follow-up with his PCP for further management. He will follow-up in the office in </= 7 days given post-STEMI status. A lipid panel and LFTs will need to be checked in 6 weeks given the initiation of a statin. Determination of timing/utility of repeat stress Myoview based on symptoms and given residual CAD can be determined at that time. He has been asked to abstain from work activities x 2 weeks. This information, including post-cath instructions and activity restrictions, has been clearly outlined in the discharge AVS.   Discharge Vitals:  Blood pressure 124/84, pulse 108, temperature 100.5 F (38.1 C),  temperature source Oral, resp. rate 18, height 5\' 10"  (1.778 m), weight 119.659 kg (263 lb 12.8 oz), SpO2 96.00%.   Weight change: 4.518 kg (9 lb 15.4 oz)  Labs: Recent Labs     04/07/12  1512  04/08/12  0445  WBC  16.0*  15.8*  HGB  15.4  14.2  HCT  41.7  39.4  MCV  88.2  89.1  PLT  204  196    Recent Labs Lab 04/07/12 1043 04/07/12 1512 04/08/12 0445  NA 135  --  135  K 3.5  --  3.7  CL 97  --  99  CO2 25  --  26  BUN 16  --  14  CREATININE 0.79 0.68 0.83  CALCIUM 9.3  --  8.7  PROT  --   --  6.3  BILITOT  --   --  0.9  ALKPHOS  --   --  79  ALT  --   --  92*  AST  --   --  244*  GLUCOSE 296*  --  223*    Recent Labs     04/07/12  1512  04/07/12  1819  04/08/12  0011  TROPONINI  >20.00*  >20.00*  >20.00*   Recent Labs     04/08/12  0445  CHOL  196  HDL  29*  LDLCALC  115*  TRIG  262*  CHOLHDL  6.8   Disposition:  Discharge Orders  Future Appointments Provider Department Dept Phone   04/15/2012 2:00 PM Tonny Bollman, MD [facility] Main [facility] [place]) [phone]   Future Orders Complete By Expires     Amb Referral to Cardiac Rehabilitation  As directed     Comments:      Referring to [facility] Phase 2    Diet - low sodium heart healthy  As directed     Increase activity slowly  As directed           Follow-up Information   Follow up with Tonny Bollman, MD On 04/15/2012. (At 2:00 PM for follow-up. )    Contact information:   [address] [place] [address] [phone]       Follow up with [facility] TOM, MD. Schedule an appointment as soon as possible for a visit in 1 week. (For diabetes management. )    Contact information:   [address] [place] [address] [phone]       Discharge Medications:    Medication List    STOP taking these medications       pravastatin 20 MG tablet  Commonly known as:  PRAVACHOL      TAKE these medications       aspirin 81 MG EC tablet  Take 1  tablet (81 mg total) by mouth daily.     atorvastatin 80 MG tablet  Commonly known as:  LIPITOR  Take 1 tablet (80 mg total) by mouth daily at 6 PM.     clopidogrel 75 MG tablet  Commonly known as:  PLAVIX  Take 1 tablet (75 mg total) by mouth daily.  Start taking on:  04/10/2012     glipiZIDE 10 MG tablet  Commonly known as:  GLUCOTROL  Take 10 mg by mouth 2 (two) times daily before a meal.     lisinopril 5 MG tablet  Commonly known as:  PRINIVIL,ZESTRIL  Take 5 mg by mouth daily.     metFORMIN 1000 MG tablet  Commonly known as:  GLUCOPHAGE  Take 1,000 mg by mouth 2 (two) times daily with a meal.     metoprolol tartrate 12.5 mg Tabs  Commonly known as:  LOPRESSOR  Take 0.5 tablets (12.5 mg total) by mouth 2 (two) times daily.     nitroGLYCERIN 0.4 MG SL tablet  Commonly known as:  NITROSTAT  Place 1 tablet (0.4 mg total) under the tongue every 5 (five) minutes x 3 doses as needed for chest pain.     pantoprazole 40 MG tablet  Commonly known as:  PROTONIX  Take 1 tablet (40 mg total) by mouth 2 (two) times daily.       Outstanding Labs/Studies: lipid panel, LFTs in 6 weeks  Duration of Discharge Encounter: Greater than 30 minutes including physician time.  Signed, R. Hurman Horn, PA-C 04/09/2012, 12:22 PM

## 2012-04-09 NOTE — Progress Notes (Signed)
Pt's EKG showed possible changes from yesterday's strip. Pt currently chest pain free, complaining of a headache. Ward Givens, NP paged and made aware. No new orders at this time. Will continue to monitor.  Harless Litten, RN 04/09/12

## 2012-04-09 NOTE — Progress Notes (Signed)
    Subjective:  No chest pain. Had breathlessness the last 2 nights, self-limited.  Objective:  Vital Signs in the last 24 hours: Temp:  [99 F (37.2 C)-100.5 F (38.1 C)] 100.5 F (38.1 C) (04/09 0657) Pulse Rate:  [92-107] 102 (04/09 0657) Resp:  [18-27] 18 (04/09 0657) BP: (102-122)/(62-80) 111/74 mmHg (04/09 0657) SpO2:  [91 %-97 %] 96 % (04/09 0657) Weight:  [119.659 kg (263 lb 12.8 oz)-122 kg (268 lb 15.4 oz)] 119.659 kg (263 lb 12.8 oz) (04/09 0657)  Intake/Output from previous day: 04/08 0701 - 04/09 0700 In: 450 [P.O.:450] Out: 650 [Urine:650]  Physical Exam: Pt is alert and oriented, pleasant obese male in NAD HEENT: normal Neck: JVP - normal, carotids 2+= without bruits Lungs: CTA bilaterally CV: RRR without murmur or gallop Abd: soft, NT, Positive BS, no hepatomegaly Ext: no C/C/E, distal pulses intact and equal Skin: warm/dry no rash   Lab Results:  Recent Labs  04/07/12 1512 04/08/12 0445  WBC 16.0* 15.8*  HGB 15.4 14.2  PLT 204 196    Recent Labs  04/07/12 1043 04/07/12 1512 04/08/12 0445  NA 135  --  135  K 3.5  --  3.7  CL 97  --  99  CO2 25  --  26  GLUCOSE 296*  --  223*  BUN 16  --  14  CREATININE 0.79 0.68 0.83    Recent Labs  04/07/12 1819 04/08/12 0011  TROPONINI >20.00* >20.00*   EKG: sinus rhythm with anterior J-point elevation, new from previous  Assessment/Plan:  1. Inferoposterior STEMI - s/p primary PCI. Feels well and looks clinically stable. Suspect EKG change related to evolution of posterior infarct. No symptoms to suggest new event. His dyspnea last night sounds like classic brilinta-related shortness of breath (sudden onset, felt like 'can't catch breath,' self-limited). Will change to plavix 300 mg today then 75 mg daily starting tomorrow. No effient because he was diagnosed with gastric ulcers 6 months ago and concerned about bleeding. Continue ACE, beta-blocker, high-potency statin. Home today.  2. Diabetes -  Type 2. Resume home meds. Follow-up Dr Tanya Nones. Lifestyle modification discussed at length.  3. Hyperlipidemia - atorvastatin 80 mg started.  4. Dispo: home today, f/u Tereso Newcomer 1 week and I will see long-term. Tentatively plan RTN work 2 weeks. Follow-up Myoview scan 4 weeks depending on symptoms considering his residual CAD.  Tonny Bollman, M.D. 04/09/2012, 9:33 AM

## 2012-04-10 ENCOUNTER — Telehealth: Payer: Self-pay | Admitting: Family Medicine

## 2012-04-10 ENCOUNTER — Ambulatory Visit (INDEPENDENT_AMBULATORY_CARE_PROVIDER_SITE_OTHER): Payer: Medicaid Other | Admitting: Family Medicine

## 2012-04-10 ENCOUNTER — Encounter: Payer: Self-pay | Admitting: Family Medicine

## 2012-04-10 VITALS — BP 100/60 | HR 78 | Temp 98.6°F | Resp 20 | Wt 267.0 lb

## 2012-04-10 DIAGNOSIS — I10 Essential (primary) hypertension: Secondary | ICD-10-CM

## 2012-04-10 DIAGNOSIS — IMO0001 Reserved for inherently not codable concepts without codable children: Secondary | ICD-10-CM

## 2012-04-10 DIAGNOSIS — E785 Hyperlipidemia, unspecified: Secondary | ICD-10-CM

## 2012-04-10 DIAGNOSIS — I7090 Unspecified atherosclerosis: Secondary | ICD-10-CM

## 2012-04-10 DIAGNOSIS — I2510 Atherosclerotic heart disease of native coronary artery without angina pectoris: Secondary | ICD-10-CM

## 2012-04-10 MED ORDER — PANTOPRAZOLE SODIUM 40 MG PO TBEC: 40.0000 mg | DELAYED_RELEASE_TABLET | Freq: Two times a day (BID) | ORAL | Status: DC

## 2012-04-10 MED ORDER — GLIPIZIDE 10 MG PO TABS: 10.0000 mg | ORAL_TABLET | Freq: Two times a day (BID) | ORAL | Status: DC

## 2012-04-10 MED ORDER — METFORMIN HCL 1000 MG PO TABS: 1000.0000 mg | ORAL_TABLET | Freq: Two times a day (BID) | ORAL | Status: DC

## 2012-04-10 MED ORDER — LISINOPRIL 5 MG PO TABS: 5.0000 mg | ORAL_TABLET | Freq: Every day | ORAL | Status: DC

## 2012-04-10 NOTE — Progress Notes (Signed)
Subjective:    Patient ID: Juan Ray, male    DOB: 07/19/1960, 52 y.o.   MRN: [id number]  HPI Patient here for hospital followup. Unfortunately he was admitted with an STEMI DUE to complete occlusion of the left circumflex coronary artery.  Dr. Excell Seltzer was able to successfully stent through the lesion.  Patient is currently denying any chest pain. He does report some fatigue and shortness of breath. The knee is painful to be alive. He is currently on aspirin 81 mg by mouth daily and Plavix 75 mg by mouth daily and will need to continue these for at least one year. He tried Brilinta but had bronchospasms.    Unfortunately he has not been controlling his diabetes. His hemoglobin A1c in the hospital was greater than 9. He had been rationing his metformin and his glipizide over the last month to a month and a half. He states he is not taking it regularly. He is not checking his sugars prior to being admitted to the hospital.  He has not been adhering to a low carbohydrate diet. He has not been engaging in any aerobic exercise.  With regard to his hyperlipidemia he was found to have an LDL of 116. His goal LDL is less than 70 due to the myocardial infarction.  He was started on Lipitor 80 mg by mouth daily. He will need to have this rechecked in 6 weeks. He denies any myalgias or right upper quadrant pain since starting medication.  He is currently taking Cipro 5 mg by mouth daily and Lopressor 12.5 mg by mouth twice a day for hypertension and secondary prevention of myocardial infarction.  He denies any dizziness or presyncope. Past Medical History  Diagnosis Date  . Diabetes mellitus   . High cholesterol   . Hypertension   . Pancreatitis     a. mild by CT 06/2011  . Obesity   . Peptic ulcer disease     a. 06/2009 EGD: multiple gastric and duodenal ulcers.  Marland Kitchen CAD (coronary artery disease) 04/07/2012    Inferoposterior STEMI s/p DES-mid LCx   Current Outpatient Prescriptions on File Prior to  Visit  Medication Sig Dispense Refill  . aspirin EC 81 MG EC tablet Take 1 tablet (81 mg total) by mouth daily.      Marland Kitchen atorvastatin (LIPITOR) 80 MG tablet Take 1 tablet (80 mg total) by mouth daily at 6 PM.  30 tablet  3  . clopidogrel (PLAVIX) 75 MG tablet Take 1 tablet (75 mg total) by mouth daily.  30 tablet  3  . metoprolol tartrate (LOPRESSOR) 12.5 mg TABS Take 0.5 tablets (12.5 mg total) by mouth 2 (two) times daily.  30 tablet  2  . nitroGLYCERIN (NITROSTAT) 0.4 MG SL tablet Place 1 tablet (0.4 mg total) under the tongue every 5 (five) minutes x 3 doses as needed for chest pain.  25 tablet  3   No current facility-administered medications on file prior to visit.   History   Social History  . Marital Status: Married    Spouse Name: N/A    Number of Children: N/A  . Years of Education: N/A   Occupational History  . Not on file.   Social History Main Topics  . Smoking status: Former Games developer  . Smokeless tobacco: Former Neurosurgeon    Quit date: 01/01/1998  . Alcohol Use: No  . Drug Use: No  . Sexually Active: Yes   Other Topics Concern  . Not on file  Social History Narrative   Lives in [place], with his wife and 3 yr old son.      Review of Systems  All other systems reviewed and are negative.       Objective:   Physical Exam  Constitutional: He appears well-developed and well-nourished.  Eyes: Conjunctivae are normal. Pupils are equal, round, and reactive to light.  Neck: Normal range of motion. Neck supple. No JVD present. No thyromegaly present.  Cardiovascular: Normal rate, regular rhythm, normal heart sounds and intact distal pulses.   No murmur heard. Pulmonary/Chest: Effort normal and breath sounds normal. No respiratory distress. He has no wheezes. He has no rales.  Abdominal: Soft. Bowel sounds are normal. He exhibits no distension and no mass. There is no tenderness. There is no rebound and no guarding.  Lymphadenopathy:    He has no cervical  adenopathy.          Assessment & Plan:  1. ASCVD (arteriosclerotic cardiovascular disease) Continue aspirin and Plavix for at least 12 months. This was emphasized to the patient. We will need to continue protonic due to the Plavix. He cannot tolerate Prilosec.  2. Other and unspecified hyperlipidemia Go LDL is now less than 70. We'll recheck a CMP and fasting lipid panel in 6 weeks.  3. Hypertension Blood pressure is well-controlled continue current medications at present doses   4. Type II or unspecified type diabetes mellitus without mention of complication, uncontrolled Advised the patient to resume metformin 1000 mg by mouth twice a day and glipizide 10 mg by mouth twice a day mainly due to cost. He is to check his fasting blood sugar every morning and his two-hour postprandial sugars every evening. He will bring these to the folllow up with me in 6 weeks. His goal fasting blood sugar is less than 130 and his goal postprandial sugar is less than 160.  If his sugars are not at goal we'll consider Januvia vs Victoza vs lantus.

## 2012-04-10 NOTE — Telephone Encounter (Signed)
rx refilled.

## 2012-04-15 ENCOUNTER — Encounter: Payer: Self-pay | Admitting: Cardiovascular Disease

## 2012-04-15 ENCOUNTER — Ambulatory Visit (INDEPENDENT_AMBULATORY_CARE_PROVIDER_SITE_OTHER): Payer: Medicaid Other | Admitting: Cardiovascular Disease

## 2012-04-15 VITALS — BP 121/76 | HR 79 | Ht 70.0 in | Wt 259.0 lb

## 2012-04-15 DIAGNOSIS — I2510 Atherosclerotic heart disease of native coronary artery without angina pectoris: Secondary | ICD-10-CM

## 2012-04-15 NOTE — Progress Notes (Signed)
HPI:  52 year old gentleman presenting for hospital followup evaluation. The patient was hospitalized April 7 with an inferolateral ST elevation MI. He was found to have total occlusion of left circumflex and was treated with a drug-eluting stent. He also had diffuse coronary artery disease with segmental disease throughout the LAD and total occlusion of the right coronary artery. His left ventricular ejection fraction was 45%. His post MI hospital course was uncomplicated and he was discharged home on day 2.  He feels well. 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 He denies CP, dyspnea, edema, or palps. No lightheadedness or syncope. Had shortness of breath on Brilinta but this resolved about 48 hours after switching to plavix.   Outpatient Encounter Prescriptions as of 04/15/2012  Medication Sig Dispense Refill  . aspirin EC 81 MG EC tablet Take 1 tablet (81 mg total) by mouth daily.      Marland Kitchen atorvastatin (LIPITOR) 80 MG tablet Take 1 tablet (80 mg total) by mouth daily at 6 PM.  30 tablet  3  . clopidogrel (PLAVIX) 75 MG tablet Take 1 tablet (75 mg total) by mouth daily.  30 tablet  3  . glipiZIDE (GLUCOTROL) 10 MG tablet Take 1 tablet (10 mg total) by mouth 2 (two) times daily before a meal.  60 tablet  3  . lisinopril (PRINIVIL,ZESTRIL) 5 MG tablet Take 1 tablet (5 mg total) by mouth daily.  30 tablet  3  . metFORMIN (GLUCOPHAGE) 1000 MG tablet Take 1 tablet (1,000 mg total) by mouth 2 (two) times daily with a meal.  60 tablet  3  . metoprolol tartrate (LOPRESSOR) 12.5 mg TABS Take 0.5 tablets (12.5 mg total) by mouth 2 (two) times daily.  30 tablet  2  . nitroGLYCERIN (NITROSTAT) 0.4 MG SL tablet Place 1 tablet (0.4 mg total) under the tongue every 5 (five) minutes x 3 doses as needed for chest pain.  25 tablet  3  . pantoprazole (PROTONIX) 40 MG tablet Take 1 tablet (40 mg total) by mouth 2 (two) times daily.  30 tablet  11   No facility-administered encounter medications on file  as of 04/15/2012.    No Known Allergies  Past Medical History  Diagnosis Date  . Diabetes mellitus   . High cholesterol   . Hypertension   . Pancreatitis     a. mild by CT 06/2011  . Obesity   . Peptic ulcer disease     a. 06/2009 EGD: multiple gastric and duodenal ulcers.  Marland Kitchen CAD (coronary artery disease) 04/07/2012    Inferoposterior STEMI s/p DES-mid LCx    ROS: Negative except as per HPI  BP 121/76  Pulse 79  Ht 5\' 10"  (1.778 m)  Wt 117.482 kg (259 lb)  BMI 37.16 kg/m2  PHYSICAL EXAM: Pt is alert and oriented, overweight male in NAD HEENT: normal Neck: JVP - normal, carotids 2+= without bruits Lungs: CTA bilaterally CV: RRR without murmur or gallop Abd: soft, NT, Positive BS, no hepatomegaly Ext: no C/C/E, distal pulses intact and equal Skin: warm/dry no rash  EKG:  NSR, age-indeterminate inferoposterior infarct.  Cath/PCI 04/07/2012: PROCEDURAL FINDINGS  Hemodynamics:  AO 101/68  LV 105/35  Coronary angiography:  Coronary dominance: right  Left mainstem: Widely patent without obstructive disease  Left anterior descending (LAD): The LAD is diffusely diseased. There is 70% stenosis of the proximal LAD. The diagonal branches arising from this segment are small with 70-80% ostial stenosis. The mid LAD is patent with  minor nonobstructive disease. The junction of the mid and distal LAD there is another 70% stenosis present. The LAD wraps around the left ventricular apex.  Left circumflex (LCx): The left circumflex is large in caliber. The first obtuse marginal branch is medium in caliber. The first OM has an 80% proximal stenosis. The mid circumflex just beyond the OM is totally occluded. The second OM fills late.  Right coronary artery (RCA): The right coronary artery is occluded. The vessel is moderate in caliber. It occludes just after the first RV branch. There is late antegrade filling as well as distal filling from left to right collaterals.  Left ventriculography:  There is severe hypokinesis of the inferior wall. The left ventricular ejection fraction is estimated at 45%. There is no significant mitral regurgitation.  PCI Note: Following the diagnostic procedure, the decision was made to proceed with PCI. The patient was loaded with brilinta 180 mg. He had already received unfractionated heparin. Weight-based bivalirudin was given for anticoagulation. Once a therapeutic ACT was achieved, a 6 [place] XB LAD guide catheter was inserted. A prolonged coronary guidewire was used to cross the lesion. The lesion was predilated with a 2.5 x 12 mm balloon. The lesion was then stented with a 4.0 x 24 mm proneness elements drug-eluting stent. The stent was postdilated with a 4.0 x 20 mm noncompliant balloon. Following PCI, there was 0% residual stenosis and TIMI-3 flow. Final angiography confirmed an excellent result. The patient tolerated the procedure well. There were no immediate procedural complications. A TR band was used for radial hemostasis. The patient was transferred to the post catheterization recovery area for further monitoring.  PCI Data:  Vessel - left circumflex/Segment - mid  Percent Stenosis (pre) 100  TIMI-flow 0  Stent 4.0 x 24 mm drug-eluting  Percent Stenosis (post) 0  TIMI-flow (post) 3  Final Conclusions:  1. Acute inferoposterior myocardial infarction secondary to total occlusion left circumflex, treated successfully with primary PCI.  2. Three-vessel coronary artery disease with chronic total occlusion of the right coronary artery, acute occlusion of the left circumflex, and moderate to severe stenosis of the LAD  3. Moderate LV systolic dysfunction with an estimated left ventricular ejection fraction 45%  Recommendations:  Aggressive medical therapy and risk reduction. Depending on the patient's post MI symptoms, consider outpatient stress testing once he has recovered from his MI for further risk stratification. He will need dual antiplatelet  therapy with aspirin and brilinta for at least 12 months if tolerated.   ASSESSMENT AND PLAN: 1. CAD, native vessel with recent inferoposterior/lateral MI. Multivessel disease noted. Cath images reviewed today. Recommend exercise Myoview stress scan for further risk-stratification. As long as study is not high-risk, will continue with aggressive medical management. Lengthy discussion with patient and wife regarding med Rx versus further revascularization, but as he is asymptomatic at present favor med Rx.  2. HTN - BP well-controlled.  3. Hyperlipidemia - started on high-dose statin Rx.  4. Type 2 DM - med adjustments made by Dr Tanya Nones. Patient notes improved glycemic control. He has modified diet.   Plan: same meds, exercise stress Myoview, close clinical follow-up.  Tonny Bollman 04/15/2012 7:51 PM

## 2012-04-15 NOTE — Patient Instructions (Addendum)
Your physician recommends that you schedule a follow-up appointment in: 4 weeks.   Your physician has requested that you have an exercise stress myoview. For further information please visit [email or web address] Please follow instruction sheet, as given.

## 2012-04-22 ENCOUNTER — Ambulatory Visit (HOSPITAL_COMMUNITY): Payer: Medicaid Other | Attending: Cardiovascular Disease | Admitting: Radiology

## 2012-04-22 VITALS — BP 128/84 | Ht 70.0 in | Wt 250.0 lb

## 2012-04-22 DIAGNOSIS — I2510 Atherosclerotic heart disease of native coronary artery without angina pectoris: Secondary | ICD-10-CM

## 2012-04-22 DIAGNOSIS — E119 Type 2 diabetes mellitus without complications: Secondary | ICD-10-CM | POA: Insufficient documentation

## 2012-04-22 DIAGNOSIS — Z8249 Family history of ischemic heart disease and other diseases of the circulatory system: Secondary | ICD-10-CM | POA: Insufficient documentation

## 2012-04-22 DIAGNOSIS — R0989 Other specified symptoms and signs involving the circulatory and respiratory systems: Secondary | ICD-10-CM | POA: Insufficient documentation

## 2012-04-22 DIAGNOSIS — E785 Hyperlipidemia, unspecified: Secondary | ICD-10-CM | POA: Insufficient documentation

## 2012-04-22 DIAGNOSIS — I10 Essential (primary) hypertension: Secondary | ICD-10-CM | POA: Insufficient documentation

## 2012-04-22 DIAGNOSIS — R0609 Other forms of dyspnea: Secondary | ICD-10-CM | POA: Insufficient documentation

## 2012-04-22 DIAGNOSIS — I252 Old myocardial infarction: Secondary | ICD-10-CM | POA: Insufficient documentation

## 2012-04-22 DIAGNOSIS — R0789 Other chest pain: Secondary | ICD-10-CM | POA: Insufficient documentation

## 2012-04-22 DIAGNOSIS — R079 Chest pain, unspecified: Secondary | ICD-10-CM

## 2012-04-22 MED ORDER — TECHNETIUM TC 99M SESTAMIBI GENERIC - CARDIOLITE
30.0000 | Freq: Once | INTRAVENOUS | Status: AC | PRN
  Administered 2012-04-22: 30 via INTRAVENOUS

## 2012-04-22 MED ORDER — TECHNETIUM TC 99M SESTAMIBI GENERIC - CARDIOLITE
10.0000 | Freq: Once | INTRAVENOUS | Status: AC | PRN
  Administered 2012-04-22: 10 via INTRAVENOUS

## 2012-04-22 NOTE — Progress Notes (Signed)
[facility] SITE 3 NUCLEAR MED [address] [place], [place] [address] [phone]    Cardiology Nuclear Med Study  Juan Ray is a 52 y.o. male     MRN : [id number]     DOB: 1960-06-09  Procedure Date: 04/22/2012  Nuclear Med Background Indication for Stress Test:  Evaluation for Ischemia, Stent Patency, and Patient seen in hospital on 04-07-12 Chest pressure> Inferior Posterior STEMI> assess for residual CAD History:  04/07/12 Heart Catheterization 3-V CAD;04/07/12 Myocardial Infarction/ inf.post. STEMI; 04/07/12- STENTS:mid cfx with residual CAD with med tx Cardiac Risk Factors: Family History - CAD, Hypertension, Lipids and NIDDM  Symptoms:  DOE, Last chest pain/pressure was on 04-07-12 with STEMI   Nuclear Pre-Procedure Caffeine/Decaff Intake:  None > 12 hrs NPO After: 7:00pm   Lungs:  clear O2 Sat: 98% on room air. IV 0.9% NS with Angio Cath:  20g  IV Site: R Antecubital x 1, tolerated well IV Started by:  Irean Hong, RN  Chest Size (in):  50 Cup Size: n/a  Height: 5\' 10"  (1.778 m)  Weight:  250 lb (113.399 kg)  BMI:  Body mass index is 35.87 kg/(m^2). Tech Comments:  Held Lopressor x 24 hrs; no Glipizide, and metformin this am. Stress EKG's and Images reviewed with Dr.Brackbill. Patient denies chest pain. Ok for the patient to leave, and a follow up office visit made with Dr. Excell Seltzer for  05-02-12. Patient notified to call EMS if any chest pain unrelieved by NTG.  Irean Hong, RN     Nuclear Med Study 1 or 2 day study: 1 day  Stress Test Type:  Stress  Reading MD: Kristeen Miss, MD  Order Authorizing Provider:  Tonny Bollman, MD  Resting Radionuclide: Technetium 39m Sestamibi  Resting Radionuclide Dose: 11.0 mCi   Stress Radionuclide:  Technetium 83m Sestamibi  Stress Radionuclide Dose: 33.0 mCi           Stress Protocol Rest HR: 67 Stress HR: 155  Rest BP: 124/83 Stress BP: 142/69  Exercise Time (min): 7:03 METS: 8.6   Predicted Max HR: 167 bpm %  Max HR: 40.12 bpm Rate Pressure Product: 9581   Dose of Adenosine (mg):  n/a Dose of Lexiscan: n/a mg  Dose of Atropine (mg): n/a Dose of Dobutamine: n/a mcg/kg/min (at max HR)  Stress Test Technologist: Frederick Peers, EMT-P  Nuclear Technologist:  Domenic Polite, CNMT     Rest Procedure:  Myocardial perfusion imaging was performed at rest 45 minutes following the intravenous administration of Technetium 19m Sestamibi. Rest ECG: NSR, TWI in the inferior and lateral leads.  old Inferior lateral MI.   Stress Procedure:  The patient exercised on the treadmill utilizing the Bruce Protocol for 7:03  minutes. The patient stopped due to burning in legs and denied any chest pain.  Technetium 68m Sestamibi was injected at peak exercise and myocardial perfusion imaging was performed after a brief delay. Stress ECG: No significant change from baseline ECG  QPS Raw Data Images:  Normal; no motion artifact; normal heart/lung ratio. Stress Images:  There is a large, severe defect in the entire inferior and lateral walls.    Rest Images:  There is a large, severe defect in the entire inferior and lateral walls.  Subtraction (SDS):  No evidence of ischemia.   There is a previous Inferior lateral MI.  Transient Ischemic Dilatation (Normal <1.22):  0.93 Lung/Heart Ratio (Normal <0.45):  0.38  Quantitative Gated Spect Images QGS EDV:  162 ml QGS ESV:  99 ml  Impression Exercise Capacity:  Good exercise capacity. BP Response:  Normal blood pressure response. Clinical Symptoms:  No significant symptoms noted. ECG Impression:  No significant ST segment change suggestive of ischemia. Comparison with Prior Nuclear Study: No images to compare  Overall Impression:  Intermediate stress nuclear study.  There is evidence of a previous Inferior lateral MI which is old.  The LV function is moderately - severely depressed.   LV Ejection Fraction: 39%.  LV Wall Motion:  The LV is moderately dilated.  there is  akinesis of the lateral walls and inferior walls.  Vesta Mixer, Montez Hageman., MD, [facility] 04/22/2012, 5:58 PM Office - [phone] Pager [phone]

## 2012-04-23 ENCOUNTER — Telehealth: Payer: Self-pay | Admitting: Cardiovascular Disease

## 2012-04-23 NOTE — Telephone Encounter (Signed)
New problem    Patient did not want to disclose any information want  To speak with Dr. Excell Seltzer.

## 2012-04-23 NOTE — Telephone Encounter (Signed)
Reviewed stress test results with patient.  Juan Ray 04/23/2012 5:10 PM

## 2012-04-25 ENCOUNTER — Telehealth: Payer: Self-pay | Admitting: Family Medicine

## 2012-04-25 NOTE — Telephone Encounter (Signed)
error 

## 2012-04-25 NOTE — Telephone Encounter (Signed)
Yes decrease glipizide to 5 bid, make sure he eats TID and takes a snack before bed. If lows continue, hold glipizide altogether and call me.

## 2012-04-25 NOTE — Telephone Encounter (Signed)
Pt wifes states that pts Blood sugars are low during midday. Wants to know if you need to adjust his meds. His BS this morning was 55 has shakes duiing the day and gets disoriented. Please advise!

## 2012-04-25 NOTE — Telephone Encounter (Signed)
Pt aware spoke to wife [place]

## 2012-05-02 ENCOUNTER — Ambulatory Visit: Payer: Medicaid Other | Admitting: Cardiovascular Disease

## 2012-05-09 ENCOUNTER — Encounter: Payer: Self-pay | Admitting: Family Medicine

## 2012-05-09 ENCOUNTER — Telehealth: Payer: Self-pay | Admitting: Family Medicine

## 2012-05-09 ENCOUNTER — Ambulatory Visit (INDEPENDENT_AMBULATORY_CARE_PROVIDER_SITE_OTHER): Payer: Medicaid Other | Admitting: Family Medicine

## 2012-05-09 VITALS — BP 110/74 | HR 66 | Temp 98.1°F | Resp 16 | Wt 252.0 lb

## 2012-05-09 DIAGNOSIS — IMO0002 Reserved for concepts with insufficient information to code with codable children: Secondary | ICD-10-CM

## 2012-05-09 DIAGNOSIS — E119 Type 2 diabetes mellitus without complications: Secondary | ICD-10-CM

## 2012-05-09 DIAGNOSIS — M5416 Radiculopathy, lumbar region: Secondary | ICD-10-CM

## 2012-05-09 DIAGNOSIS — M549 Dorsalgia, unspecified: Secondary | ICD-10-CM

## 2012-05-09 MED ORDER — GABAPENTIN 300 MG PO CAPS: 300.0000 mg | ORAL_CAPSULE | Freq: Three times a day (TID) | ORAL | Status: DC

## 2012-05-09 MED ORDER — CYCLOBENZAPRINE HCL 10 MG PO TABS: 10.0000 mg | ORAL_TABLET | Freq: Three times a day (TID) | ORAL | Status: DC | PRN

## 2012-05-09 NOTE — Progress Notes (Signed)
Subjective:    Patient ID: Juan Ray, male    DOB: 1960-03-17, 52 y.o.   MRN: [id number]  HPI Patient is here for followup for his diabetes recently taking metformin 1000 mg by mouth twice a day and glipizide 5 mg twice a day. His sugars are 100 to 130 in the morning and 101 in the evenings. He is also having occasional hypoglycemic episode in the evenings in his 67s. These are symptomatic. Has the patient concerned. He is really trying to  address his diet and achieve weight .    He also complains about an itching on the lower aspect of his right thigh. He also complains of numbness and paresthesias and pins and needles dysesthesias on the anterior surface of his right thigh.  He is tried various salves for the itching which have not helped.  He also complained about muscle spasms and cramping pain in his upper back between shoulder blades. There is no angina. There is no shortness of breath. These are actually muscle spasms and back pain.   Past Medical History  Diagnosis Date  . Diabetes mellitus   . High cholesterol   . Hypertension   . Pancreatitis     a. mild by CT 06/2011  . Obesity   . Peptic ulcer disease     a. 06/2009 EGD: multiple gastric and duodenal ulcers.  Marland Kitchen CAD (coronary artery disease) 04/07/2012    Inferoposterior STEMI s/p DES-mid LCx   Current Outpatient Prescriptions on File Prior to Visit  Medication Sig Dispense Refill  . aspirin EC 81 MG EC tablet Take 1 tablet (81 mg total) by mouth daily.      Marland Kitchen atorvastatin (LIPITOR) 80 MG tablet Take 1 tablet (80 mg total) by mouth daily at 6 PM.  30 tablet  3  . clopidogrel (PLAVIX) 75 MG tablet Take 1 tablet (75 mg total) by mouth daily.  30 tablet  3  . lisinopril (PRINIVIL,ZESTRIL) 5 MG tablet Take 1 tablet (5 mg total) by mouth daily.  30 tablet  3  . metFORMIN (GLUCOPHAGE) 1000 MG tablet Take 1 tablet (1,000 mg total) by mouth 2 (two) times daily with a meal.  60 tablet  3  . metoprolol tartrate (LOPRESSOR) 12.5 mg  TABS Take 0.5 tablets (12.5 mg total) by mouth 2 (two) times daily.  30 tablet  2  . nitroGLYCERIN (NITROSTAT) 0.4 MG SL tablet Place 1 tablet (0.4 mg total) under the tongue every 5 (five) minutes x 3 doses as needed for chest pain.  25 tablet  3  . pantoprazole (PROTONIX) 40 MG tablet Take 1 tablet (40 mg total) by mouth 2 (two) times daily.  30 tablet  11   No current facility-administered medications on file prior to visit.   No Known Allergies History   Social History  . Marital Status: Married    Spouse Name: N/A    Number of Children: N/A  . Years of Education: N/A   Occupational History  . Not on file.   Social History Main Topics  . Smoking status: Former Games developer  . Smokeless tobacco: Former Neurosurgeon    Quit date: 01/01/1998  . Alcohol Use: No  . Drug Use: No  . Sexually Active: Yes   Other Topics Concern  . Not on file   Social History Narrative   Lives in [place], with his wife and 16 yr old son.     Review of Systems    remainder of the review of systems is  negative Objective:   Physical Exam  Vitals reviewed. Constitutional: He is oriented to person, place, and time. He appears well-developed and well-nourished.  Cardiovascular: Normal rate, regular rhythm and normal heart sounds.  Exam reveals no gallop.   No murmur heard. Pulmonary/Chest: Effort normal and breath sounds normal. No respiratory distress. He has no wheezes. He has no rales. He exhibits no tenderness.  Abdominal: Soft. Bowel sounds are normal. He exhibits no distension. There is no tenderness. There is no rebound and no guarding.  Neurological: He is alert and oriented to person, place, and time. He has normal reflexes. He displays normal reflexes. No cranial nerve deficit. He exhibits normal muscle tone. Coordination normal.          Assessment & Plan:  1. DM (diabetes mellitus) I've asked the patient to discontinue glipizide night. Therefore he should be taking 5 mg only in the morning.  He will continue metformin 1000 mg twice a day  2. Lumbar radiculopathy I believe this itching on his lateral thighs likely related to a lumbar radiculopathy versus a pinched lateral cutaneous nerve.  I do not think is a coincidence that the itching occurs in the same area that he has numbness and dysesthesias.   Furthermore,  the rash that he describes comes and goes and seems to be related to when he scratches. I have asked the patient to stop wearing belts. I've asked him to wear loosefitting garments around his waistband. We will try gabapentin 300 mg by mouth 3 times a day by mouth see the patient back in 2 weeks. If pain persists would proceed with an MRI of the lumbar spine.  3. Back pain, acute Flexeril 10 mg by mouth every 8 hours when necessary.

## 2012-05-13 ENCOUNTER — Ambulatory Visit (INDEPENDENT_AMBULATORY_CARE_PROVIDER_SITE_OTHER): Payer: Medicaid Other | Admitting: Cardiovascular Disease

## 2012-05-13 ENCOUNTER — Encounter: Payer: Self-pay | Admitting: Cardiovascular Disease

## 2012-05-13 VITALS — BP 112/72 | HR 88 | Ht 70.0 in | Wt 253.0 lb

## 2012-05-13 DIAGNOSIS — I2119 ST elevation (STEMI) myocardial infarction involving other coronary artery of inferior wall: Secondary | ICD-10-CM

## 2012-05-13 NOTE — Patient Instructions (Signed)
Your physician has requested that you have an echocardiogram in 6 MONTHS. Echocardiography is a painless test that uses sound waves to create images of your heart. It provides your doctor with information about the size and shape of your heart and how well your heart's chambers and valves are working. This procedure takes approximately one hour. There are no restrictions for this procedure.  Your physician wants you to follow-up in: 6 MONTHS with Dr Cooper.  You will receive a reminder letter in the mail two months in advance. If you don't receive a letter, please call our office to schedule the follow-up appointment.  Your physician recommends that you continue on your current medications as directed. Please refer to the Current Medication list given to you today.   

## 2012-05-13 NOTE — Progress Notes (Signed)
HPI:  52 year old gentleman presenting for followup evaluation. The patient has coronary artery disease and presented April 7 with an inferolateral ST elevation MI. The patient was treated with stenting the left circumflex. His post MI left ventricular ejection fraction was 45%. He had an uncomplicated hospital course. We decided to treat his residual coronary disease medically. He had a followup Myoview scan with the result below. In summary this demonstrated a large inferolateral scar without significant ischemia.  From a symptomatic perspective he is doing very well. He denies exertional chest pain or pressure. He denies dyspnea, edema, orthopnea, or palpitations. He's lost some weight and his diabetes is much better controlled. He is very motivated to continue with weight loss.  Outpatient Encounter Prescriptions as of 05/13/2012  Medication Sig Dispense Refill  . aspirin EC 81 MG EC tablet Take 1 tablet (81 mg total) by mouth daily.      Marland Kitchen atorvastatin (LIPITOR) 80 MG tablet Take 1 tablet (80 mg total) by mouth daily at 6 PM.  30 tablet  3  . clopidogrel (PLAVIX) 75 MG tablet Take 1 tablet (75 mg total) by mouth daily.  30 tablet  3  . cyclobenzaprine (FLEXERIL) 10 MG tablet Take 1 tablet (10 mg total) by mouth 3 (three) times daily as needed for muscle spasms.  90 tablet  1  . gabapentin (NEURONTIN) 300 MG capsule Take 1 capsule (300 mg total) by mouth 3 (three) times daily.  90 capsule  3  . glipiZIDE (GLUCOTROL) 10 MG tablet Take 5 mg by mouth 2 (two) times daily before a meal. Takes 1 pill in am and 1/2 pill pm      . lisinopril (PRINIVIL,ZESTRIL) 5 MG tablet Take 1 tablet (5 mg total) by mouth daily.  30 tablet  3  . metFORMIN (GLUCOPHAGE) 1000 MG tablet Take 1 tablet (1,000 mg total) by mouth 2 (two) times daily with a meal.  60 tablet  3  . metoprolol tartrate (LOPRESSOR) 12.5 mg TABS Take 0.5 tablets (12.5 mg total) by mouth 2 (two) times daily.  30 tablet  2  . nitroGLYCERIN  (NITROSTAT) 0.4 MG SL tablet Place 1 tablet (0.4 mg total) under the tongue every 5 (five) minutes x 3 doses as needed for chest pain.  25 tablet  3  . pantoprazole (PROTONIX) 40 MG tablet Take 1 tablet (40 mg total) by mouth 2 (two) times daily.  30 tablet  11   No facility-administered encounter medications on file as of 05/13/2012.    No Known Allergies  Past Medical History  Diagnosis Date  . Diabetes mellitus   . High cholesterol   . Hypertension   . Pancreatitis     a. mild by CT 06/2011  . Obesity   . Peptic ulcer disease     a. 06/2009 EGD: multiple gastric and duodenal ulcers.  Marland Kitchen CAD (coronary artery disease) 04/07/2012    Inferoposterior STEMI s/p DES-mid LCx    ROS: Negative except as per HPI  BP 112/72  Pulse 88  Ht 5\' 10"  (1.778 m)  Wt 114.76 kg (253 lb)  BMI 36.3 kg/m2  SpO2 97%  PHYSICAL EXAM: Pt is alert and oriented, pleasant obese male in NAD HEENT: normal Neck: JVP - normal, carotids 2+= without bruits Lungs: CTA bilaterally CV: RRR without murmur or gallop Abd: soft, NT, Positive BS, no hepatomegaly Ext: no C/C/E, distal pulses intact and equal Skin: warm/dry no rash  Myoview: QPS  Raw Data Images: Normal; no motion artifact;  normal heart/lung ratio.  Stress Images: There is a large, severe defect in the entire inferior and lateral walls.  Rest Images: There is a large, severe defect in the entire inferior and lateral walls.  Subtraction (SDS): No evidence of ischemia. There is a previous Inferior lateral MI.  Transient Ischemic Dilatation (Normal <1.22): 0.93  Lung/Heart Ratio (Normal <0.45): 0.38  Quantitative Gated Spect Images  QGS EDV: 162 ml  QGS ESV: 99 ml  Impression  Exercise Capacity: Good exercise capacity.  BP Response: Normal blood pressure response.  Clinical Symptoms: No significant symptoms noted.  ECG Impression: No significant ST segment change suggestive of ischemia.  Comparison with Prior Nuclear Study: No images to compare    Overall Impression: Intermediate stress nuclear study. There is evidence of a previous Inferior lateral MI which is old. The LV function is moderately - severely depressed.  LV Ejection Fraction: 39%. LV Wall Motion: The LV is moderately dilated. there is akinesis of the lateral walls and inferior walls. .   ASSESSMENT AND PLAN: 1. Coronary artery disease, native vessel with recent inferolateral myocardial infarction. The patient is stable on his current medical program. We'll continue without changes. Lengthy discussion about liberalizing his restrictions. He has a 77-year-old son who he would like to be a little lift up and carried. His son weighs 48 pounds. I think since he is doing well without symptoms at this is fine. I asked him to avoid extremes of exertion. I didn't make any medicine changes today. I like to see him back in 6 months with a followup echocardiogram. He was encouraged to continue work on diet and exercise. We had a specific discussion about minimizing carbohydrate /starch intake.  2. Hyperlipidemia. The patient is on a high intensity statin drug with Lipitor 80 mg. He'll be followed by Dr. Tanya Nones.  I'll see him back in 6 months with an echo to follow-up on his LV dysfunction. LVEF by [facility] was 39%  Tonny Bollman 05/13/2012 3:09 PM

## 2012-05-22 ENCOUNTER — Ambulatory Visit: Payer: Medicaid Other | Admitting: Family Medicine

## 2012-06-25 ENCOUNTER — Other Ambulatory Visit: Payer: Self-pay | Admitting: *Deleted

## 2012-06-25 MED ORDER — METOPROLOL TARTRATE 12.5 MG HALF TABLET: 12.5000 mg | ORAL_TABLET | Freq: Two times a day (BID) | ORAL | Status: DC

## 2012-06-25 NOTE — Telephone Encounter (Signed)
This was an error

## 2012-06-25 NOTE — Telephone Encounter (Signed)
REFILL SENT 

## 2012-07-10 ENCOUNTER — Encounter (HOSPITAL_COMMUNITY): Payer: Self-pay | Admitting: Emergency Medicine

## 2012-07-10 ENCOUNTER — Observation Stay (HOSPITAL_COMMUNITY)
Admission: EM | Admit: 2012-07-10 | Discharge: 2012-07-11 | Disposition: A | Discharge status: Home / Self Care | Payer: Medicaid Other | Attending: Internal Medicine | Admitting: Internal Medicine

## 2012-07-10 ENCOUNTER — Emergency Department (HOSPITAL_COMMUNITY): Payer: Medicaid Other

## 2012-07-10 DIAGNOSIS — E669 Obesity, unspecified: Secondary | ICD-10-CM | POA: Insufficient documentation

## 2012-07-10 DIAGNOSIS — I252 Old myocardial infarction: Secondary | ICD-10-CM | POA: Insufficient documentation

## 2012-07-10 DIAGNOSIS — E7800 Pure hypercholesterolemia, unspecified: Secondary | ICD-10-CM | POA: Insufficient documentation

## 2012-07-10 DIAGNOSIS — R0789 Other chest pain: Secondary | ICD-10-CM

## 2012-07-10 DIAGNOSIS — I2510 Atherosclerotic heart disease of native coronary artery without angina pectoris: Secondary | ICD-10-CM | POA: Diagnosis present

## 2012-07-10 DIAGNOSIS — R799 Abnormal finding of blood chemistry, unspecified: Secondary | ICD-10-CM | POA: Insufficient documentation

## 2012-07-10 DIAGNOSIS — I10 Essential (primary) hypertension: Secondary | ICD-10-CM | POA: Diagnosis present

## 2012-07-10 DIAGNOSIS — E119 Type 2 diabetes mellitus without complications: Secondary | ICD-10-CM | POA: Insufficient documentation

## 2012-07-10 DIAGNOSIS — R0602 Shortness of breath: Secondary | ICD-10-CM | POA: Insufficient documentation

## 2012-07-10 DIAGNOSIS — E782 Mixed hyperlipidemia: Secondary | ICD-10-CM | POA: Diagnosis present

## 2012-07-10 DIAGNOSIS — R079 Chest pain, unspecified: Principal | ICD-10-CM | POA: Insufficient documentation

## 2012-07-10 DIAGNOSIS — E785 Hyperlipidemia, unspecified: Secondary | ICD-10-CM | POA: Diagnosis present

## 2012-07-10 DIAGNOSIS — E109 Type 1 diabetes mellitus without complications: Secondary | ICD-10-CM | POA: Diagnosis present

## 2012-07-10 LAB — PROTIME-INR
INR: 0.96 (ref 0.00–1.49)
Prothrombin Time: 12.6 seconds (ref 11.6–15.2)

## 2012-07-10 LAB — POCT I-STAT TROPONIN I: Troponin i, poc: 0.01 ng/mL (ref 0.00–0.08)

## 2012-07-10 LAB — BASIC METABOLIC PANEL
CO2: 31 mEq/L (ref 19–32)
Chloride: 100 mEq/L (ref 96–112)
Potassium: 4.5 mEq/L (ref 3.5–5.1)
Sodium: 137 mEq/L (ref 135–145)

## 2012-07-10 LAB — CBC
Hemoglobin: 15.1 g/dL (ref 13.0–17.0)
Platelets: 248 10*3/uL (ref 150–400)
RBC: 4.58 MIL/uL (ref 4.22–5.81)
WBC: 13 10*3/uL — ABNORMAL HIGH (ref 4.0–10.5)

## 2012-07-10 LAB — PRO B NATRIURETIC PEPTIDE: Pro B Natriuretic peptide (BNP): 480.6 pg/mL — ABNORMAL HIGH (ref 0–125)

## 2012-07-10 MED ORDER — ASPIRIN 325 MG PO TABS
325.0000 mg | ORAL_TABLET | ORAL | Status: AC
  Administered 2012-07-10: 325 mg via ORAL
  Filled 2012-07-10: qty 1

## 2012-07-10 NOTE — ED Provider Notes (Signed)
History    CSN: [id number] Arrival date & time 07/10/12  2108  First MD Initiated Contact with Patient 07/10/12 2137     Chief Complaint  Patient presents with  . Chest Pain   (Consider location/radiation/quality/duration/timing/severity/associated sxs/prior Treatment) The history is provided by the patient, medical records and a relative.   Patient presents to the ED for chest pain and mild shortness of breath since this morning. Patient states he woke up and had some discomfort in his mid sternal region, which has gotten progressively worse throughout the day. Patient states when he leans backward in a chair, he gets the sensation that there is a knife driving and to his chest that radiates all the way to his back.  No associated palpitations, diaphoresis, dizziness, weakness, numbness, or paresthesias.  No nausea, vomiting, or diarrhea. Patient had a recent MI approximately 9 weeks ago who, treated with PCI and stenting.  Patient states these symptoms are similar to his prior MI. He is taken approximately 3 nitroglycerin PTA without relief. Cardiologist is Dr. Excell Seltzer. Pt is currently on dual antiplatelet therapy for remaining blockages of RCA and LAD- aspirin and Plavix.  Also on diet and exercise regimen, lost approx 40 lbs since MI.  Recently went on a trip with his daughter to [place]. They stretched the trip out over several days, stopping every 2-3 hours for him to stretch his legs. No prior history of DVT or PE.  Past Medical History  Diagnosis Date  . Diabetes mellitus   . High cholesterol   . Hypertension   . Pancreatitis     a. mild by CT 06/2011  . Obesity   . Peptic ulcer disease     a. 06/2009 EGD: multiple gastric and duodenal ulcers.  Marland Kitchen CAD (coronary artery disease) 04/07/2012    Inferoposterior STEMI s/p DES-mid LCx   Past Surgical History  Procedure Laterality Date  . Esophagogastroduodenoscopy  06/18/2011    NL ESOPHAGUS/UlcerATED LESIONS/ SUPERFICIAL Ulcers  .  Coronary angioplasty with stent placement  04/07/2012    70% prox LAD, 70-80% ostial diagonal, 70% mid-distal LAD, 80% prox OM1, mid LCx totally occluded just distal to OM1 s/p DES, occluded RCA; severe inferior HK, LVEF 45%   Family History  Problem Relation Age of Onset  . Heart attack Father     MI x 2 in late 50's, currently 70's   History  Substance Use Topics  . Smoking status: Former Games developer  . Smokeless tobacco: Former Neurosurgeon    Quit date: 01/01/1998  . Alcohol Use: No    Review of Systems  Respiratory: Positive for shortness of breath.   Cardiovascular: Positive for chest pain.  All other systems reviewed and are negative.    Allergies  Review of patient's allergies indicates no known allergies.  Home Medications   Current Outpatient Rx  Name  Route  Sig  Dispense  Refill  . aspirin EC 81 MG EC tablet   Oral   Take 1 tablet (81 mg total) by mouth daily.         Marland Kitchen atorvastatin (LIPITOR) 80 MG tablet   Oral   Take 1 tablet (80 mg total) by mouth daily at 6 PM.   30 tablet   3   . clopidogrel (PLAVIX) 75 MG tablet   Oral   Take 1 tablet (75 mg total) by mouth daily.   30 tablet   3   . cyclobenzaprine (FLEXERIL) 10 MG tablet   Oral   Take 1  tablet (10 mg total) by mouth 3 (three) times daily as needed for muscle spasms.   90 tablet   1   . gabapentin (NEURONTIN) 300 MG capsule   Oral   Take 1 capsule (300 mg total) by mouth 3 (three) times daily.   90 capsule   3   . glipiZIDE (GLUCOTROL) 10 MG tablet   Oral   Take 5 mg by mouth 2 (two) times daily before a meal. Takes 1 pill in am and 1/2 pill pm         . lisinopril (PRINIVIL,ZESTRIL) 5 MG tablet   Oral   Take 1 tablet (5 mg total) by mouth daily.   30 tablet   3   . metFORMIN (GLUCOPHAGE) 1000 MG tablet   Oral   Take 1 tablet (1,000 mg total) by mouth 2 (two) times daily with a meal.   60 tablet   3   . metoprolol tartrate (LOPRESSOR) 12.5 mg TABS   Oral   Take 0.5 tablets (12.5 mg  total) by mouth 2 (two) times daily.   30 tablet   4   . nitroGLYCERIN (NITROSTAT) 0.4 MG SL tablet   Sublingual   Place 1 tablet (0.4 mg total) under the tongue every 5 (five) minutes x 3 doses as needed for chest pain.   25 tablet   3   . pantoprazole (PROTONIX) 40 MG tablet   Oral   Take 1 tablet (40 mg total) by mouth 2 (two) times daily.   30 tablet   11    BP 136/86  Pulse 105  Temp(Src) 98.7 F (37.1 C) (Oral)  Resp 16  SpO2 97%  Physical Exam  Nursing note and vitals reviewed. Constitutional: He is oriented to person, place, and time. He appears well-developed and well-nourished. No distress.  HENT:  Head: Normocephalic and atraumatic.  Mouth/Throat: Oropharynx is clear and moist.  Eyes: Conjunctivae and EOM are normal. Pupils are equal, round, and reactive to light.  Neck: Normal range of motion.  Cardiovascular: Normal rate, regular rhythm, normal heart sounds, intact distal pulses and normal pulses.   Strong distal pulses bilaterally  Pulmonary/Chest: Effort normal and breath sounds normal. No respiratory distress. He has no decreased breath sounds. He has no wheezes.  Chest pain is NOT reproducible with palpation to chest wall  Abdominal: Soft. Bowel sounds are normal.  Musculoskeletal: Normal range of motion.  Neurological: He is alert and oriented to person, place, and time. He has normal strength. No cranial nerve deficit or sensory deficit.  Skin: Skin is warm and dry. He is not diaphoretic.  Psychiatric: He has a normal mood and affect. His speech is normal.    ED Course  Procedures (including critical care time)   Date: 07/10/2012  Rate: 104  Rhythm: sinus tachycardia  QRS Axis: normal  Intervals: normal  ST/T Wave abnormalities: normal  Conduction Disutrbances:left anterior fascicular block  Narrative Interpretation:   Old EKG Reviewed: unchanged   Labs Reviewed  CBC - Abnormal; Notable for the following:    WBC 13.0 (*)    MCHC 36.3 (*)     All other components within normal limits  BASIC METABOLIC PANEL - Abnormal; Notable for the following:    Glucose, Bld 228 (*)    GFR calc non Af Amer 81 (*)    All other components within normal limits  PRO B NATRIURETIC PEPTIDE - Abnormal; Notable for the following:    Pro B Natriuretic peptide (BNP) 480.6 (*)  All other components within normal limits  PROTIME-INR  D-DIMER, QUANTITATIVE  POCT I-STAT TROPONIN I   Dg Chest 2 View  07/10/2012   *RADIOLOGY REPORT*  Clinical Data: Chest pressure and pain.  Shortness of breath. Recent heart attack 10 weeks ago with stent placement.  CHEST - 2 VIEW  Comparison: 04/07/2012  Findings: The heart size and pulmonary vascularity are normal. The lungs appear clear and expanded without focal air space disease or consolidation. No blunting of the costophrenic angles.  No pneumothorax.  Mediastinal contours appear intact.  Mild degenerative changes in the spine.  No significant change since previous study.  IMPRESSION: No evidence of active pulmonary disease.   Original Report Authenticated By: Burman Nieves, M.D.   No diagnosis found.  MDM   EKG sinus tachycardia, no acute ischemic changes.  Trop negative.  CXR clear.  Labs as above.  Given RF and recent MI, i feel pt needs admission.  Consulted [facility], Dr. Terressa Koyanagi-- pt will be admitted for further evaluation.            Garlon Hatchet, PA-C 07/11/12 0105

## 2012-07-10 NOTE — ED Notes (Signed)
PT. REPORTS MID CHEST PAIN WITH SLIGHT SOB ONSET THIS MORNING , NO RADIATING , DENIES NAUSEA OR DIAPHORESIS , PT. TOOK 2 NTG SL PTA WITH NO RELIEF , PT. STATED HISTORY OF CAD /CORONARY STENT . HIS CARDIOLOGIST IS DR. Excell Seltzer.

## 2012-07-11 ENCOUNTER — Encounter (HOSPITAL_COMMUNITY): Payer: Self-pay | Admitting: *Deleted

## 2012-07-11 DIAGNOSIS — R0789 Other chest pain: Secondary | ICD-10-CM

## 2012-07-11 DIAGNOSIS — R079 Chest pain, unspecified: Secondary | ICD-10-CM

## 2012-07-11 LAB — CBC
Hemoglobin: 14.7 g/dL (ref 13.0–17.0)
MCHC: 35.8 g/dL (ref 30.0–36.0)
RDW: 12.9 % (ref 11.5–15.5)
WBC: 11 10*3/uL — ABNORMAL HIGH (ref 4.0–10.5)

## 2012-07-11 LAB — TROPONIN I: Troponin I: <0.3 ng/mL (ref <0.30)

## 2012-07-11 LAB — CREATININE, SERUM
Creatinine, Ser: 0.87 mg/dL (ref 0.50–1.35)
GFR calc Af Amer: >90 mL/min (ref >90)
GFR calc non Af Amer: >90 mL/min (ref >90)

## 2012-07-11 MED ORDER — GABAPENTIN 300 MG PO CAPS
300.0000 mg | ORAL_CAPSULE | Freq: Three times a day (TID) | ORAL | Status: DC
  Administered 2012-07-11: 300 mg via ORAL
  Filled 2012-07-11 (×3): qty 1

## 2012-07-11 MED ORDER — CYCLOBENZAPRINE HCL 10 MG PO TABS
10.0000 mg | ORAL_TABLET | Freq: Three times a day (TID) | ORAL | Status: DC | PRN
  Filled 2012-07-11: qty 1

## 2012-07-11 MED ORDER — ONDANSETRON HCL 4 MG/2ML IJ SOLN: 4.0000 mg | Freq: Four times a day (QID) | INTRAMUSCULAR | Status: DC | PRN

## 2012-07-11 MED ORDER — ATORVASTATIN CALCIUM 80 MG PO TABS
80.0000 mg | ORAL_TABLET | Freq: Every day | ORAL | Status: DC
  Filled 2012-07-11: qty 1

## 2012-07-11 MED ORDER — ENOXAPARIN SODIUM 40 MG/0.4ML ~~LOC~~ SOLN
40.0000 mg | Freq: Every day | SUBCUTANEOUS | Status: DC
  Filled 2012-07-11: qty 0.4

## 2012-07-11 MED ORDER — METOPROLOL TARTRATE 12.5 MG HALF TABLET
12.5000 mg | ORAL_TABLET | Freq: Two times a day (BID) | ORAL | Status: DC
  Administered 2012-07-11 (×2): 12.5 mg via ORAL
  Filled 2012-07-11 (×3): qty 1

## 2012-07-11 MED ORDER — SODIUM CHLORIDE 0.9 % IV SOLN
INTRAVENOUS | Status: DC
  Administered 2012-07-11: 02:00:00 via INTRAVENOUS

## 2012-07-11 MED ORDER — PANTOPRAZOLE SODIUM 40 MG PO TBEC
40.0000 mg | DELAYED_RELEASE_TABLET | Freq: Two times a day (BID) | ORAL | Status: DC
  Administered 2012-07-11 (×2): 40 mg via ORAL
  Filled 2012-07-11 (×2): qty 1

## 2012-07-11 MED ORDER — INSULIN ASPART 100 UNIT/ML ~~LOC~~ SOLN: 0.0000 [IU] | Freq: Three times a day (TID) | SUBCUTANEOUS | Status: DC

## 2012-07-11 MED ORDER — ACETAMINOPHEN 325 MG PO TABS: 650.0000 mg | ORAL_TABLET | ORAL | Status: DC | PRN

## 2012-07-11 MED ORDER — LISINOPRIL 5 MG PO TABS
5.0000 mg | ORAL_TABLET | Freq: Every day | ORAL | Status: DC
  Administered 2012-07-11: 5 mg via ORAL
  Filled 2012-07-11: qty 1

## 2012-07-11 MED ORDER — CLOPIDOGREL BISULFATE 75 MG PO TABS
75.0000 mg | ORAL_TABLET | Freq: Every day | ORAL | Status: DC
  Administered 2012-07-11: 75 mg via ORAL
  Filled 2012-07-11: qty 1

## 2012-07-11 MED ORDER — GLIPIZIDE 5 MG PO TABS
5.0000 mg | ORAL_TABLET | Freq: Every day | ORAL | Status: DC
  Filled 2012-07-11: qty 1

## 2012-07-11 MED ORDER — NITROGLYCERIN 0.4 MG SL SUBL: 0.4000 mg | SUBLINGUAL_TABLET | SUBLINGUAL | Status: DC | PRN

## 2012-07-11 MED ORDER — ASPIRIN EC 81 MG PO TBEC: 81.0000 mg | DELAYED_RELEASE_TABLET | Freq: Every day | ORAL | Status: DC

## 2012-07-11 NOTE — Progress Notes (Signed)
SUBJECTIVE:  No further chest pain.    PHYSICAL EXAM Filed Vitals:   07/11/12 0030 07/11/12 0100 07/11/12 0143 07/11/12 0624  BP: 125/68 116/65 131/74 116/72  Pulse: 85 85 81 72  Temp:   98.2 F (36.8 C) 98.4 F (36.9 C)  TempSrc:   Oral Oral  Resp: 15 18 18 18   Height:   5\' 9"  (1.753 m)   Weight:   240 lb (108.863 kg)   SpO2: 98% 97% 99% 98%   General:  No distress Lungs:  Clear Heart:  RRR Abdomen:  Positive bowel sounds, no rebound no guarding Extremities:  No edema  LABS:  Results for orders placed during the hospital encounter of 07/10/12 (from the past 24 hour(s))  CBC     Status: Abnormal   Collection Time    07/10/12  9:24 PM      Result Value Range   WBC 13.0 (*) 4.0 - 10.5 K/uL   RBC 4.58  4.22 - 5.81 MIL/uL   Hemoglobin 15.1  13.0 - 17.0 g/dL   HCT 29.5  62.1 - 30.8 %   MCV 90.8  78.0 - 100.0 fL   MCH 33.0  26.0 - 34.0 pg   MCHC 36.3 (*) 30.0 - 36.0 g/dL   RDW 65.7  84.6 - 96.2 %   Platelets 248  150 - 400 K/uL  BASIC METABOLIC PANEL     Status: Abnormal   Collection Time    07/10/12  9:24 PM      Result Value Range   Sodium 137  135 - 145 mEq/L   Potassium 4.5  3.5 - 5.1 mEq/L   Chloride 100  96 - 112 mEq/L   CO2 31  19 - 32 mEq/L   Glucose, Bld 228 (*) 70 - 99 mg/dL   BUN 18  6 - 23 mg/dL   Creatinine, Ser 9.52  0.50 - 1.35 mg/dL   Calcium 9.2  8.4 - 84.1 mg/dL   GFR calc non Af Amer 81 (*) >90 mL/min   GFR calc Af Amer >90  >90 mL/min  PRO B NATRIURETIC PEPTIDE     Status: Abnormal   Collection Time    07/10/12  9:24 PM      Result Value Range   Pro B Natriuretic peptide (BNP) 480.6 (*) 0 - 125 pg/mL  PROTIME-INR     Status: None   Collection Time    07/10/12  9:24 PM      Result Value Range   Prothrombin Time 12.6  11.6 - 15.2 seconds   INR 0.96  0.00 - 1.49  POCT I-STAT TROPONIN I     Status: None   Collection Time    07/10/12  9:31 PM      Result Value Range   Troponin i, poc 0.01  0.00 - 0.08 ng/mL   Comment 3             D-DIMER, QUANTITATIVE     Status: None   Collection Time    07/11/12 12:14 AM      Result Value Range   D-Dimer, Quant 0.34  0.00 - 0.48 ug/mL-FEU  MRSA PCR SCREENING     Status: None   Collection Time    07/11/12  1:51 AM      Result Value Range   MRSA by PCR NEGATIVE  NEGATIVE  GLUCOSE, CAPILLARY     Status: Abnormal   Collection Time    07/11/12  1:51 AM  Result Value Range   Glucose-Capillary 158 (*) 70 - 99 mg/dL   Comment 1 Notify RN    GLUCOSE, CAPILLARY     Status: Abnormal   Collection Time    07/11/12  7:26 AM      Result Value Range   Glucose-Capillary 164 (*) 70 - 99 mg/dL   No intake or output data in the 24 hours ending 07/11/12 0745  EKG:  Normal sinus, rate 79, axis WNL, intervals WNL, no acute ST T wave changes. 07/11/2012  ASSESSMENT AND PLAN:  CHEST PAIN:  Atypical and resolved.  No further work up.  Ambulate.  If no further pain and no EKG changes then OK to go home later this morning.   LEUKOCYTOSIS:  Mild and no evidence of infection.  He can have this repeated as an outpatient.   HTN:  Continue current meds.  Fayrene Fearing [facility] 07/11/2012 7:45 AM

## 2012-07-11 NOTE — H&P (Signed)
Cardiology History and Physical  PICKARD,WARREN TOM, MD  History of Present Illness (and review of medical records): Juan Ray is a 52 y.o. male who presents for evaluation of chest pain.  He is followed by Dr. Excell Seltzer and was last seen in follow up May 2014 for known CAD with recent inferolateral ST elevation MI in April 2014. The patient was treated with stenting the left circumflex. His post MI left ventricular ejection fraction was 45%. He had an uncomplicated hospital course and his residual coronary disease was treated medically. He had a followup Myoview scan which revealed a large inferolateral scar without significant ischemia.   He now presents with one day of constant chest pain.  Pain began around 9am today.  Pain is described as sharp, 7/10 pain.  He denies any associated symptoms.  Pain is not made worse with exertion.  Pain was no alleviated with NTG. He just recent drove back from [place] last night around midnight.  He however denies any shortness of breath, lower extremity swelling or pain.  In the ED he had D-dimer w/in normal limits.  He is chest pain down to 2/10 with no intervention, however, does not want further pain meds at this time.  Previous diagnostic testing for coronary artery disease includes: cardiac catheterization, echocardiogram and stress thallium. Previous history of cardiac disease includes Angina Chest Pain Coronary Artery Disease Coronary Artery Stent MI. Coronary artery disease risk factors include: diabetes mellitus, dyslipidemia, hypertension, male gender and obesity (BMI >= 30 kg/m2). Patient denies history of arrhythmia, CABG, pericarditis and valvular disease.  Review of Systems Pertinent items are noted in HPI. Further review of systems was otherwise negative other than stated in HPI.  Patient Active Problem List   Diagnosis Date Noted  . ST elevation myocardial infarction (STEMI) of inferoposterior wall 04/09/2012  . CAD (coronary artery  disease), native coronary artery 04/09/2012  . PUD (peptic ulcer disease) 04/09/2012  . Epigastric pain 06/18/2011  . HTN (hypertension) 06/18/2011  . DM (diabetes mellitus) 06/18/2011  . Hyperlipidemia 06/18/2011   Past Medical History  Diagnosis Date  . Diabetes mellitus   . High cholesterol   . Hypertension   . Pancreatitis     a. mild by CT 06/2011  . Obesity   . Peptic ulcer disease     a. 06/2009 EGD: multiple gastric and duodenal ulcers.  Marland Kitchen CAD (coronary artery disease) 04/07/2012    Inferoposterior STEMI s/p DES-mid LCx    Past Surgical History  Procedure Laterality Date  . Esophagogastroduodenoscopy  06/18/2011    NL ESOPHAGUS/UlcerATED LESIONS/ SUPERFICIAL Ulcers  . Coronary angioplasty with stent placement  04/07/2012    70% prox LAD, 70-80% ostial diagonal, 70% mid-distal LAD, 80% prox OM1, mid LCx totally occluded just distal to OM1 s/p DES, occluded RCA; severe inferior HK, LVEF 45%     (Not in a hospital admission) No Known Allergies  History  Substance Use Topics  . Smoking status: Former Games developer  . Smokeless tobacco: Former Neurosurgeon    Quit date: 01/01/1998  . Alcohol Use: No    Family History  Problem Relation Age of Onset  . Heart attack Father     MI x 2 in late 50's, currently 70's     Objective:  Patient Vitals for the past 8 hrs:  BP Temp Temp src Pulse Resp SpO2  07/11/12 0000 126/72 mmHg - - 85 15 99 %  07/10/12 2330 123/62 mmHg - - 95 20 98 %  07/10/12 2245 123/77 mmHg - -  99 22 98 %  07/10/12 2120 136/86 mmHg 98.7 F (37.1 C) Oral 105 16 97 %   General appearance: alert, cooperative, appears stated age and no distress Head: Normocephalic, without obvious abnormality, atraumatic Eyes: conjunctivae/corneas clear. PERRL, EOM's intact. Fundi benign. Neck: no carotid bruit, no JVD and supple, symmetrical, trachea midline Lungs: clear to auscultation bilaterally Chest wall: no tenderness Heart: regular rate and rhythm, S1, S2 normal, no murmur,  click, rub or gallop Abdomen: soft, non-tender; bowel sounds normal; no masses,  no organomegaly Extremities: extremities normal, atraumatic, no cyanosis or edema Pulses: 2+ and symmetric Neurologic: Grossly normal  Results for orders placed during the hospital encounter of 07/10/12 (from the past 48 hour(s))  CBC     Status: Abnormal   Collection Time    07/10/12  9:24 PM      Result Value Range   WBC 13.0 (*) 4.0 - 10.5 K/uL   RBC 4.58  4.22 - 5.81 MIL/uL   Hemoglobin 15.1  13.0 - 17.0 g/dL   HCT 16.1  09.6 - 04.5 %   MCV 90.8  78.0 - 100.0 fL   MCH 33.0  26.0 - 34.0 pg   MCHC 36.3 (*) 30.0 - 36.0 g/dL   RDW 40.9  81.1 - 91.4 %   Platelets 248  150 - 400 K/uL  BASIC METABOLIC PANEL     Status: Abnormal   Collection Time    07/10/12  9:24 PM      Result Value Range   Sodium 137  135 - 145 mEq/L   Potassium 4.5  3.5 - 5.1 mEq/L   Chloride 100  96 - 112 mEq/L   CO2 31  19 - 32 mEq/L   Glucose, Bld 228 (*) 70 - 99 mg/dL   BUN 18  6 - 23 mg/dL   Creatinine, Ser 7.82  0.50 - 1.35 mg/dL   Calcium 9.2  8.4 - 95.6 mg/dL   GFR calc non Af Amer 81 (*) >90 mL/min   GFR calc Af Amer >90  >90 mL/min   Comment:            The eGFR has been calculated     using the CKD EPI equation.     This calculation has not been     validated in all clinical     situations.     eGFR's persistently     <90 mL/min signify     possible Chronic Kidney Disease.  PRO B NATRIURETIC PEPTIDE     Status: Abnormal   Collection Time    07/10/12  9:24 PM      Result Value Range   Pro B Natriuretic peptide (BNP) 480.6 (*) 0 - 125 pg/mL  PROTIME-INR     Status: None   Collection Time    07/10/12  9:24 PM      Result Value Range   Prothrombin Time 12.6  11.6 - 15.2 seconds   INR 0.96  0.00 - 1.49  POCT I-STAT TROPONIN I     Status: None   Collection Time    07/10/12  9:31 PM      Result Value Range   Troponin i, poc 0.01  0.00 - 0.08 ng/mL   Comment 3            Comment: Due to the release kinetics  of cTnI,     a negative result within the first hours     of the onset of symptoms does not rule  out     myocardial infarction with certainty.     If myocardial infarction is still suspected,     repeat the test at appropriate intervals.  D-DIMER, QUANTITATIVE     Status: None   Collection Time    07/11/12 12:14 AM      Result Value Range   D-Dimer, Quant 0.34  0.00 - 0.48 ug/mL-FEU   Comment:            AT THE INHOUSE ESTABLISHED CUTOFF     VALUE OF 0.48 ug/mL FEU,     THIS ASSAY HAS BEEN DOCUMENTED     IN THE LITERATURE TO HAVE     A SENSITIVITY AND NEGATIVE     PREDICTIVE VALUE OF AT LEAST     98 TO 99%.  THE TEST RESULT     SHOULD BE CORRELATED WITH     AN ASSESSMENT OF THE CLINICAL     PROBABILITY OF DVT / VTE.   Dg Chest 2 View  07/10/2012   *RADIOLOGY REPORT*  Clinical Data: Chest pressure and pain.  Shortness of breath. Recent heart attack 10 weeks ago with stent placement.  CHEST - 2 VIEW  Comparison: 04/07/2012  Findings: The heart size and pulmonary vascularity are normal. The lungs appear clear and expanded without focal air space disease or consolidation. No blunting of the costophrenic angles.  No pneumothorax.  Mediastinal contours appear intact.  Mild degenerative changes in the spine.  No significant change since previous study.  IMPRESSION: No evidence of active pulmonary disease.   Original Report Authenticated By: Burman Nieves, M.D.    ECG:  Sinus tach HR 104, likely old inferior infarct, rsr' suggest right ventricular conduction delay, no acute changes when compared to prior.  Assessment: Atypical chest pain CAD, recent inferolateral STEMI 04/2012 LV Dysfunction EF 39% HTN HLD DM PUD  Plan:  1. Cardiology Observation 2. Continuous monitoring on Telemetry. 3. Repeat ekg on admit, prn chest pain or arrythmia 4. Trend cardiac biomarkers, r/o MI 5. Medical management to include ASA, Plavix, BB, ACEI Statin, NTG prn 6. SSI, hold metformin 7. NPO while  r/o. Gentle IVFs 8. PPI

## 2012-07-11 NOTE — Discharge Summary (Signed)
Patient ID: Juan Ray,  MRN: [id number], DOB/AGE: 1960-02-14 52 y.o.  Admit date: 07/10/2012 Discharge date: 07/11/2012  Primary Care Provider: [facility] Primary Cardiologist: Judie Petit. Excell Seltzer, MD   Discharge Diagnoses Principal Problem:   Midsternal chest pain Active Problems:   CAD (coronary artery disease), native coronary artery   HTN (hypertension)   DM (diabetes mellitus)   Hyperlipidemia  Allergies No Known Allergies  Procedures  None  History of Present Illness  52 year old male with prior history of coronary artery disease status post inferolateral ST elevation MI in April of 2014 with stenting of the left circumflex at that time. He has subsequently undergone Myoview scanning which showed a large inferolateral scar without significant ischemia. He was in his usual state of health until proximal 90 9 AM on July 10 when he developed chest discomfort without associated symptoms. Pain persisted throughout the day and was not alleviated with sublingual nitroglycerin. He apparently recently drove back from [place] who has not had any significant leg pain or swelling. Because of constant chest pain, he presented to the emergency department where his d-dimer was within normal limits an ECG was without acute changes. Troponin was normal. He was observed for further evaluation.  Hospital Course  Patient ruled out for myocardial infarction. Chest pain eased off overnight. In the absence of objective evidence of ischemia, patient be discharged home today without further workup planned.  Discharge Vitals Blood pressure 113/73, pulse 69, temperature 98.4 F (36.9 C), temperature source Oral, resp. rate 18, height 5\' 9"  (1.753 m), weight 240 lb (108.863 kg), SpO2 98.00%.  Filed Weights   07/11/12 0143  Weight: 240 lb (108.863 kg)   Labs  CBC  Recent Labs  07/10/12 2124 07/11/12 0720  WBC 13.0* 11.0*  HGB 15.1 14.7  HCT 41.6 41.1  MCV 90.8 90.1  PLT 248 199   Basic  Metabolic Panel  Recent Labs  07/10/12 2124 07/11/12 0720  NA 137  --   K 4.5  --   CL 100  --   CO2 31  --   GLUCOSE 228*  --   BUN 18  --   CREATININE 1.03 0.87  CALCIUM 9.2  --    Cardiac Enzymes  Recent Labs  07/11/12 0720  TROPONINI <0.30   D-Dimer  Recent Labs  07/11/12 0014  DDIMER 0.34   Disposition  Pt is being discharged home today in good condition.  Follow-up Plans & Appointments  Follow-up Information   Follow up with Tereso Newcomer, PA-C On 07/29/2012. (11:10 AM)    Contact information:   [address] [place] [address] [phone]      Discharge Medications    Medication List         aspirin 81 MG EC tablet  Take 1 tablet (81 mg total) by mouth daily.     atorvastatin 80 MG tablet  Commonly known as:  LIPITOR  Take 1 tablet (80 mg total) by mouth daily at 6 PM.     clopidogrel 75 MG tablet  Commonly known as:  PLAVIX  Take 1 tablet (75 mg total) by mouth daily.     cyclobenzaprine 10 MG tablet  Commonly known as:  FLEXERIL  Take 1 tablet (10 mg total) by mouth 3 (three) times daily as needed for muscle spasms.     gabapentin 300 MG capsule  Commonly known as:  NEURONTIN  Take 1 capsule (300 mg total) by mouth 3 (three) times daily.  glipiZIDE 10 MG tablet  Commonly known as:  GLUCOTROL  Take 5 mg by mouth every evening.     lisinopril 5 MG tablet  Commonly known as:  PRINIVIL,ZESTRIL  Take 1 tablet (5 mg total) by mouth daily.     metFORMIN 500 MG tablet  Commonly known as:  GLUCOPHAGE  Take 500 mg by mouth 2 (two) times daily with a meal.     metoprolol tartrate 12.5 mg Tabs  Commonly known as:  LOPRESSOR  Take 0.5 tablets (12.5 mg total) by mouth 2 (two) times daily.     nitroGLYCERIN 0.4 MG SL tablet  Commonly known as:  NITROSTAT  Place 1 tablet (0.4 mg total) under the tongue every 5 (five) minutes x 3 doses as needed for chest pain.     pantoprazole 40 MG tablet  Commonly known as:   PROTONIX  Take 1 tablet (40 mg total) by mouth 2 (two) times daily.       Outstanding Labs/Studies  None  Duration of Discharge Encounter   Greater than 30 minutes including physician time.  Signed, Nicolasa Ducking NP 07/11/2012, 10:26 AM   Patient seen and examined.  Plan as discussed in my rounding note for today and outlined above. Fayrene Fearing [facility]  07/11/2012  10:55 AM

## 2012-07-11 NOTE — Progress Notes (Signed)
Utilization review completed. Jemmie Rhinehart, RN, BSN. 

## 2012-07-12 NOTE — ED Provider Notes (Signed)
Medical screening examination/treatment/procedure(s) were performed by non-physician practitioner and as supervising physician I was immediately available for consultation/collaboration.  Eaden Hettinger R. Aveyah Greenwood, MD 07/12/12 1400 

## 2012-07-18 ENCOUNTER — Encounter: Payer: Self-pay | Admitting: Family Medicine

## 2012-07-18 ENCOUNTER — Ambulatory Visit (INDEPENDENT_AMBULATORY_CARE_PROVIDER_SITE_OTHER): Payer: Medicaid Other | Admitting: Family Medicine

## 2012-07-18 VITALS — BP 110/68 | HR 80 | Temp 98.1°F | Resp 20 | Wt 249.0 lb

## 2012-07-18 DIAGNOSIS — R079 Chest pain, unspecified: Secondary | ICD-10-CM

## 2012-07-18 DIAGNOSIS — R1013 Epigastric pain: Secondary | ICD-10-CM

## 2012-07-18 MED ORDER — SUCRALFATE 1 G PO TABS: 1.0000 g | ORAL_TABLET | Freq: Four times a day (QID) | ORAL | Status: DC

## 2012-07-18 MED ORDER — DEXLANSOPRAZOLE 60 MG PO CPDR: 60.0000 mg | DELAYED_RELEASE_CAPSULE | Freq: Every day | ORAL | Status: DC

## 2012-07-18 NOTE — Progress Notes (Signed)
Subjective:    Patient ID: Juan Ray, male    DOB: 08/29/1960, 52 y.o.   MRN: [id number]  HPI Patient has a history of coronary artery disease. Catheterization in April 2014 revealed three-vessel coronary artery disease. He had a STEMI. He underwent stents to the mid circumflex with residual coronary artery disease that was treated medically. He was admitted July 11 with substernal chest pain that radiated into his neck.  He was kept overnight. Cardiac enzymes were slightly were negative. The patient was discharged home with a diagnosis of atypical chest pain. He does have a history of pancreatitis as well as multiple stomach ulcers and duodenal ulcers. This was discovered during an admission in June of 2013. He has been on protonix 40 mg by mouth twice a day ever since. The patient is in pain this morning. He states  the pain begins around the stomach and radiates all the way up to his neck. It is worse when he lies down.  He states he can feel something come up into his chest when he lies down. He denies any shortness of breath. He denies any exercise component to the chest pain. His EKG today in the office shows normal sinus rhythm at 74 beats per minute with left axis deviation. There is evidence of an inferior myocardial infarction with Q waves in 2,3 and aVF. Otherwise there is no evidence of ischemia.  This is unchanged from an EKG performed July 10 in the hospital. Past Medical History  Diagnosis Date  . Diabetes mellitus   . High cholesterol   . Hypertension   . Pancreatitis     a. mild by CT 06/2011  . Obesity   . Peptic ulcer disease     a. 06/2009 EGD: multiple gastric and duodenal ulcers.  Marland Kitchen CAD (coronary artery disease) 04/07/2012    Inferoposterior STEMI s/p DES-mid LCx   Current Outpatient Prescriptions on File Prior to Visit  Medication Sig Dispense Refill  . aspirin EC 81 MG EC tablet Take 1 tablet (81 mg total) by mouth daily.      Marland Kitchen atorvastatin (LIPITOR) 80 MG tablet Take  1 tablet (80 mg total) by mouth daily at 6 PM.  30 tablet  3  . clopidogrel (PLAVIX) 75 MG tablet Take 1 tablet (75 mg total) by mouth daily.  30 tablet  3  . cyclobenzaprine (FLEXERIL) 10 MG tablet Take 1 tablet (10 mg total) by mouth 3 (three) times daily as needed for muscle spasms.  90 tablet  1  . gabapentin (NEURONTIN) 300 MG capsule Take 1 capsule (300 mg total) by mouth 3 (three) times daily.  90 capsule  3  . glipiZIDE (GLUCOTROL) 10 MG tablet Take 5 mg by mouth every evening.       Marland Kitchen lisinopril (PRINIVIL,ZESTRIL) 5 MG tablet Take 1 tablet (5 mg total) by mouth daily.  30 tablet  3  . metFORMIN (GLUCOPHAGE) 500 MG tablet Take 500 mg by mouth 2 (two) times daily with a meal.      . metoprolol tartrate (LOPRESSOR) 12.5 mg TABS Take 0.5 tablets (12.5 mg total) by mouth 2 (two) times daily.  30 tablet  4  . nitroGLYCERIN (NITROSTAT) 0.4 MG SL tablet Place 1 tablet (0.4 mg total) under the tongue every 5 (five) minutes x 3 doses as needed for chest pain.  25 tablet  3  . pantoprazole (PROTONIX) 40 MG tablet Take 1 tablet (40 mg total) by mouth 2 (two) times daily.  30 tablet  11   No current facility-administered medications on file prior to visit.   No Known Allergies History   Social History  . Marital Status: Married    Spouse Name: N/A    Number of Children: N/A  . Years of Education: N/A   Occupational History  . Not on file.   Social History Main Topics  . Smoking status: Former Games developer  . Smokeless tobacco: Former Neurosurgeon    Quit date: 01/01/1998  . Alcohol Use: No  . Drug Use: No  . Sexually Active: Yes   Other Topics Concern  . Not on file   Social History Narrative   Lives in [place], with his wife and 79 yr old son.       Review of Systems  All other systems reviewed and are negative.       Objective:   Physical Exam  Vitals reviewed. Constitutional: He appears well-developed and well-nourished.  HENT:  Head: Normocephalic.  Mouth/Throat: Oropharynx  is clear and moist. No oropharyngeal exudate.  Neck: Neck supple. No JVD present. No thyromegaly present.  Cardiovascular: Normal rate, regular rhythm and normal heart sounds.   No murmur heard. Pulmonary/Chest: Effort normal and breath sounds normal. No respiratory distress. He has no wheezes. He has no rales. He exhibits no tenderness.  Abdominal: Soft. Bowel sounds are normal. He exhibits no distension. There is no tenderness. There is no rebound and no guarding.  Lymphadenopathy:    He has no cervical adenopathy.          Assessment & Plan:  1. Chest pain Do not feel his chest pain is cardiac in nature.  EKG shows no significant changes after 8 days of pain in a negative ACS rule out. - EKG 12-Lead  2. Abdominal pain, epigastric I feel the patient may be dealing with a hiatal hernia or possibly peptic ulcer disease.  Check lipase to rule out pancreatitis. Check he'll go back to pylori antibodies to rule out the presence of infection. Discontinue protonix.  Begin Exelon 60 mg by mouth daily and Carafate 1 g by mouth 4 times a day.  Check in one week or sooner if worse. - dexlansoprazole (DEXILANT) 60 MG capsule; Take 1 capsule (60 mg total) by mouth daily.  Dispense: 30 capsule; Refill: 5 - sucralfate (CARAFATE) 1 G tablet; Take 1 tablet (1 g total) by mouth 4 (four) times daily.  Dispense: 120 tablet; Refill: 0 - Helicobacter pylori abs-IgG+IgA, bld - Lipase

## 2012-07-22 LAB — HELICOBACTER PYLORI ABS-IGG+IGA, BLD: H Pylori IgG: 0.73 {ISR}

## 2012-07-29 ENCOUNTER — Encounter: Payer: Self-pay | Admitting: Physician Assistant

## 2012-07-29 ENCOUNTER — Ambulatory Visit (INDEPENDENT_AMBULATORY_CARE_PROVIDER_SITE_OTHER): Payer: Medicaid Other | Admitting: Physician Assistant

## 2012-07-29 ENCOUNTER — Telehealth: Payer: Self-pay | Admitting: Family Medicine

## 2012-07-29 VITALS — BP 121/72 | HR 64 | Ht 69.0 in | Wt 252.0 lb

## 2012-07-29 DIAGNOSIS — R079 Chest pain, unspecified: Secondary | ICD-10-CM

## 2012-07-29 DIAGNOSIS — I25810 Atherosclerosis of coronary artery bypass graft(s) without angina pectoris: Secondary | ICD-10-CM

## 2012-07-29 DIAGNOSIS — I2510 Atherosclerotic heart disease of native coronary artery without angina pectoris: Secondary | ICD-10-CM

## 2012-07-29 DIAGNOSIS — I2589 Other forms of chronic ischemic heart disease: Secondary | ICD-10-CM

## 2012-07-29 DIAGNOSIS — K219 Gastro-esophageal reflux disease without esophagitis: Secondary | ICD-10-CM

## 2012-07-29 DIAGNOSIS — E785 Hyperlipidemia, unspecified: Secondary | ICD-10-CM

## 2012-07-29 DIAGNOSIS — I10 Essential (primary) hypertension: Secondary | ICD-10-CM

## 2012-07-29 MED ORDER — CLOPIDOGREL BISULFATE 75 MG PO TABS: 75.0000 mg | ORAL_TABLET | Freq: Every day | ORAL | Status: DC

## 2012-07-29 NOTE — Telephone Encounter (Signed)
Labs were normal.  His H. Pylori test was a positive IgA but neg IgG which is a negative test.

## 2012-07-29 NOTE — Telephone Encounter (Signed)
Pt has never heard from lab results from last week.  Was told by Cardiologist today that they were back and he needed to follow up with you because H-pylori was positive.  Please advise patient.

## 2012-07-29 NOTE — Telephone Encounter (Signed)
Pt aware of lab results 

## 2012-07-29 NOTE — Patient Instructions (Addendum)
Your physician wants you to follow-up in:  November 2014 with Dr. Excell Seltzer.  You will receive a reminder letter in the mail two months in advance. If you don't receive a letter, please call our office to schedule the follow-up appointment.  Your physician has requested that you have an echocardiogram. Echocardiography is a painless test that uses sound waves to create images of your heart. It provides your doctor with information about the size and shape of your heart and how well your heart's chambers and valves are working. This procedure takes approximately one hour. There are no restrictions for this procedure. To be done in November prior to or on same day as appt with Dr. Excell Seltzer.

## 2012-07-29 NOTE — Progress Notes (Signed)
[address], [address] [place], [place]  [address] Phone: [phone] Fax:  [phone]  Date:  07/29/2012   ID:  Avari Gelles, DOB Feb 24, 1960, MRN [id number]  PCP:  Leo Grosser, MD  Cardiologist:  Dr. Tonny Bollman    History of Present Illness: Juan Ray is a 52 y.o. male who returns for f/u after a recent admxn to the hospital with CP.  He has a hx of CAD, DM2, HTN, HL, PUD.  He suffered an inf-post STEMI 04/2012.  LHC 4/14:  pLAD 70%, oDx 70-80%, mid to dist LAD 70%, pOM1 80%, mCFX occluded, RCA with CTO, inf HK, EF 45%.  PCI:  Promus DES to mCFX.  Residual disease treated medically.  Follow up myoview 4/14 done b/c of residual CAD: intermediate risk, inf-lat scar, no ischemia, EF 39%.  Med Rx continued.  Last seen by Dr. Tonny Bollman in 5/14.  Plan was for f/u in 11/2012 with a f/u Echo.  However, he presented to the hospital 7/10-7/11 with CP.  Symptoms had persisted all day.  He had recently traveled back from [place].  DDimer was neg.  CEs remained normal.  He was observed overnight and was CP free at the time of d/c.  No further workup was planned.  He has seen his PCP since d/c and placed on PPI and carafate and H. Pylori serology is pending.  He denies further CP.  Denies any symptoms reminiscent of his prior angina.  No syncope, orthopnea, PND, edema.    Labs (7/14):  K 4.5, Cr 1.03, Hgb 14.7,   Wt Readings from Last 3 Encounters:  07/29/12 252 lb (114.306 kg)  07/18/12 249 lb (112.946 kg)  07/11/12 240 lb (108.863 kg)     Past Medical History  Diagnosis Date  . Diabetes mellitus   . High cholesterol   . Hypertension   . Pancreatitis     a. mild by CT 06/2011  . Obesity   . Peptic ulcer disease     a. 06/2009 EGD: multiple gastric and duodenal ulcers.  Marland Kitchen CAD (coronary artery disease) 04/07/2012    Inferoposterior STEMI s/p DES-mid LCx    Past Surgical History  Procedure Laterality Date  . Esophagogastroduodenoscopy  06/18/2011    NL  ESOPHAGUS/UlcerATED LESIONS/ SUPERFICIAL Ulcers  . Coronary angioplasty with stent placement  04/07/2012    70% prox LAD, 70-80% ostial diagonal, 70% mid-distal LAD, 80% prox OM1, mid LCx totally occluded just distal to OM1 s/p DES, occluded RCA; severe inferior HK, LVEF 45%     Current Outpatient Prescriptions  Medication Sig Dispense Refill  . aspirin EC 81 MG EC tablet Take 1 tablet (81 mg total) by mouth daily.      Marland Kitchen atorvastatin (LIPITOR) 80 MG tablet Take 1 tablet (80 mg total) by mouth daily at 6 PM.  30 tablet  3  . clopidogrel (PLAVIX) 75 MG tablet Take 1 tablet (75 mg total) by mouth daily.  30 tablet  3  . cyclobenzaprine (FLEXERIL) 10 MG tablet Take 1 tablet (10 mg total) by mouth 3 (three) times daily as needed for muscle spasms.  90 tablet  1  . dexlansoprazole (DEXILANT) 60 MG capsule Take 1 capsule (60 mg total) by mouth daily.  30 capsule  5  . gabapentin (NEURONTIN) 300 MG capsule Take 1 capsule (300 mg total) by mouth 3 (three) times daily.  90 capsule  3  . glipiZIDE (GLUCOTROL) 10 MG tablet Take 5 mg by mouth every evening.       Marland Kitchen  lisinopril (PRINIVIL,ZESTRIL) 5 MG tablet Take 1 tablet (5 mg total) by mouth daily.  30 tablet  3  . metFORMIN (GLUCOPHAGE) 500 MG tablet Take 500 mg by mouth 2 (two) times daily with a meal.      . metoprolol tartrate (LOPRESSOR) 12.5 mg TABS Take 0.5 tablets (12.5 mg total) by mouth 2 (two) times daily.  30 tablet  4  . nitroGLYCERIN (NITROSTAT) 0.4 MG SL tablet Place 1 tablet (0.4 mg total) under the tongue every 5 (five) minutes x 3 doses as needed for chest pain.  25 tablet  3  . sucralfate (CARAFATE) 1 G tablet Take 1 tablet (1 g total) by mouth 4 (four) times daily.  120 tablet  0   No current facility-administered medications for this visit.    Allergies:   No Known Allergies  Social History:  The patient  reports that he has quit smoking. He quit smokeless tobacco use about 14 years ago. He reports that he does not drink alcohol or  use illicit drugs.   ROS:  Please see the history of present illness.   No melena, hematochezia.   All other systems reviewed and negative.   PHYSICAL EXAM: VS:  BP 121/72  Pulse 64  Ht 5\' 9"  (1.753 m)  Wt 252 lb (114.306 kg)  BMI 37.2 kg/m2 Well nourished, well developed, in no acute distress HEENT: normal Neck: no JVD Cardiac:  normal S1, S2; RRR; no murmur Lungs:  clear to auscultation bilaterally, no wheezing, rhonchi or rales Abd: soft, nontender, no hepatomegaly Ext: no edema Skin: warm and dry Neuro:  CNs 2-12 intact, no focal abnormalities noted  EKG:  NSR, HR 64, inf-post Q waves with inf-lat TWI     ASSESSMENT AND PLAN:  1. Chest Pain:  Likely related to GERD/PUD.  F/u with PCP.  No further cardiac workup. 2. CAD:  No angina.  Continue ASA, plavix and statin.  3. Hypertension:  Controlled.  Continue current therapy.  4. Hyperlipidemia:  Continue statin. 5. Ischemic Cardiomyopathy:  Continue beta blocker, ACE.  F/u echo planned in 11/2012. 6. GERD:  Follow up with PCP. 7. Disposition:  F/u with Dr. Tonny Bollman in 11/2012 after echo.   Signed, Tereso Newcomer, PA-C  07/29/2012 11:31 AM

## 2012-07-31 ENCOUNTER — Other Ambulatory Visit: Payer: Self-pay

## 2012-07-31 MED ORDER — ATORVASTATIN CALCIUM 80 MG PO TABS: 80.0000 mg | ORAL_TABLET | Freq: Every day | ORAL | Status: DC

## 2012-08-08 ENCOUNTER — Telehealth: Payer: Self-pay | Admitting: Family Medicine

## 2012-08-08 NOTE — Telephone Encounter (Signed)
Pt wife called last pm, he was having severe chest and abd pain, had MI 5 weeks ago, was on current treatment for ? Gastric ulcers. He took all meds without relief. No N/V, SOB. Due to uncontrolled pain and recent Coronary event advised ER evaluation Wife voiced understanding

## 2012-09-04 ENCOUNTER — Emergency Department (HOSPITAL_COMMUNITY)
Admission: EM | Admit: 2012-09-04 | Discharge: 2012-09-04 | Disposition: A | Discharge status: Home / Self Care | Payer: Medicaid Other | Attending: Emergency Medicine | Admitting: Emergency Medicine

## 2012-09-04 ENCOUNTER — Encounter (HOSPITAL_COMMUNITY): Payer: Self-pay | Admitting: *Deleted

## 2012-09-04 DIAGNOSIS — E669 Obesity, unspecified: Secondary | ICD-10-CM | POA: Insufficient documentation

## 2012-09-04 DIAGNOSIS — I2510 Atherosclerotic heart disease of native coronary artery without angina pectoris: Secondary | ICD-10-CM | POA: Insufficient documentation

## 2012-09-04 DIAGNOSIS — T63461A Toxic effect of venom of wasps, accidental (unintentional), initial encounter: Secondary | ICD-10-CM | POA: Insufficient documentation

## 2012-09-04 DIAGNOSIS — Z8711 Personal history of peptic ulcer disease: Secondary | ICD-10-CM | POA: Insufficient documentation

## 2012-09-04 DIAGNOSIS — Z9861 Coronary angioplasty status: Secondary | ICD-10-CM | POA: Insufficient documentation

## 2012-09-04 DIAGNOSIS — Z7982 Long term (current) use of aspirin: Secondary | ICD-10-CM | POA: Insufficient documentation

## 2012-09-04 DIAGNOSIS — Z87891 Personal history of nicotine dependence: Secondary | ICD-10-CM | POA: Insufficient documentation

## 2012-09-04 DIAGNOSIS — E7800 Pure hypercholesterolemia, unspecified: Secondary | ICD-10-CM | POA: Insufficient documentation

## 2012-09-04 DIAGNOSIS — I10 Essential (primary) hypertension: Secondary | ICD-10-CM | POA: Insufficient documentation

## 2012-09-04 DIAGNOSIS — Z79899 Other long term (current) drug therapy: Secondary | ICD-10-CM | POA: Insufficient documentation

## 2012-09-04 DIAGNOSIS — Y9389 Activity, other specified: Secondary | ICD-10-CM | POA: Insufficient documentation

## 2012-09-04 DIAGNOSIS — I252 Old myocardial infarction: Secondary | ICD-10-CM | POA: Insufficient documentation

## 2012-09-04 DIAGNOSIS — Y9289 Other specified places as the place of occurrence of the external cause: Secondary | ICD-10-CM | POA: Insufficient documentation

## 2012-09-04 DIAGNOSIS — E119 Type 2 diabetes mellitus without complications: Secondary | ICD-10-CM | POA: Insufficient documentation

## 2012-09-04 DIAGNOSIS — Z7902 Long term (current) use of antithrombotics/antiplatelets: Secondary | ICD-10-CM | POA: Insufficient documentation

## 2012-09-04 DIAGNOSIS — T6391XA Toxic effect of contact with unspecified venomous animal, accidental (unintentional), initial encounter: Secondary | ICD-10-CM | POA: Insufficient documentation

## 2012-09-04 HISTORY — DX: Acute myocardial infarction, unspecified: I21.9

## 2012-09-04 MED ORDER — PREDNISONE 50 MG PO TABS
60.0000 mg | ORAL_TABLET | Freq: Once | ORAL | Status: AC
  Administered 2012-09-04: 60 mg via ORAL
  Filled 2012-09-04: qty 1

## 2012-09-04 MED ORDER — FAMOTIDINE 20 MG PO TABS: 20.0000 mg | ORAL_TABLET | Freq: Two times a day (BID) | ORAL | Status: DC

## 2012-09-04 MED ORDER — FAMOTIDINE 20 MG PO TABS
20.0000 mg | ORAL_TABLET | Freq: Once | ORAL | Status: AC
  Administered 2012-09-04: 20 mg via ORAL
  Filled 2012-09-04: qty 1

## 2012-09-04 MED ORDER — DIPHENHYDRAMINE HCL 25 MG PO CAPS
25.0000 mg | ORAL_CAPSULE | Freq: Once | ORAL | Status: AC
  Administered 2012-09-04: 25 mg via ORAL
  Filled 2012-09-04: qty 1

## 2012-09-04 MED ORDER — DIPHENHYDRAMINE HCL 25 MG PO TABS: 25.0000 mg | ORAL_TABLET | Freq: Four times a day (QID) | ORAL | Status: DC

## 2012-09-04 MED ORDER — PREDNISONE 10 MG PO TABS: ORAL_TABLET | ORAL | Status: DC

## 2012-09-04 NOTE — ED Provider Notes (Signed)
CSN: [id number]     Arrival date & time 09/04/12  2220 History   First MD Initiated Contact with Patient 09/04/12 2227     No chief complaint on file.  (Consider location/radiation/quality/duration/timing/severity/associated sxs/prior Treatment) The history is provided by the patient.   Juan KitchenSabastian Raimondi is a 52 y.o.  Male who presents to the ED with multiple bee stings. He states he was cutting the grass and ran over an area and the yellow jackets came up out of the ground. He has stings to the arms and legs. Happened approximately 1 or 2 hours ago. The areas continue to burn and sting. He denies shortness of breath or swelling.   Past Medical History  Diagnosis Date  . Diabetes mellitus   . High cholesterol   . Hypertension   . Pancreatitis     a. mild by CT 06/2011  . Obesity   . Peptic ulcer disease     a. 06/2009 EGD: multiple gastric and duodenal ulcers.  Juan Ray CAD (coronary artery disease) 04/07/2012    Inferoposterior STEMI s/p DES-mid LCx  . Myocardial infarction    Past Surgical History  Procedure Laterality Date  . Esophagogastroduodenoscopy  06/18/2011    NL ESOPHAGUS/UlcerATED LESIONS/ SUPERFICIAL Ulcers  . Coronary angioplasty with stent placement  04/07/2012    70% prox LAD, 70-80% ostial diagonal, 70% mid-distal LAD, 80% prox OM1, mid LCx totally occluded just distal to OM1 s/p DES, occluded RCA; severe inferior HK, LVEF 45%   Family History  Problem Relation Age of Onset  . Heart attack Father     MI x 2 in late 50's, currently 70's   History  Substance Use Topics  . Smoking status: Former Games developer  . Smokeless tobacco: Former Neurosurgeon    Quit date: 01/01/1998  . Alcohol Use: No    Review of Systems  Constitutional: Negative for fever and chills.  HENT: Negative for sore throat, trouble swallowing and neck pain.   Eyes: Negative for visual disturbance.  Respiratory: Negative for shortness of breath.   Gastrointestinal: Negative for nausea and vomiting.  Musculoskeletal:        Stings to arms and legs  Skin:       Multiple stings  Neurological: Negative for dizziness and headaches.  Psychiatric/Behavioral: Negative for confusion. The patient is not nervous/anxious.     Allergies  Review of patient's allergies indicates no known allergies.  Home Medications   Current Outpatient Rx  Name  Route  Sig  Dispense  Refill  . aspirin EC 81 MG EC tablet   Oral   Take 1 tablet (81 mg total) by mouth daily.         Juan Ray atorvastatin (LIPITOR) 80 MG tablet   Oral   Take 1 tablet (80 mg total) by mouth daily at 6 PM.   30 tablet   5   . clopidogrel (PLAVIX) 75 MG tablet   Oral   Take 1 tablet (75 mg total) by mouth daily.   30 tablet   11   . cyclobenzaprine (FLEXERIL) 10 MG tablet   Oral   Take 1 tablet (10 mg total) by mouth 3 (three) times daily as needed for muscle spasms.   90 tablet   1   . dexlansoprazole (DEXILANT) 60 MG capsule   Oral   Take 1 capsule (60 mg total) by mouth daily.   30 capsule   5   . gabapentin (NEURONTIN) 300 MG capsule   Oral   Take 1 capsule (  300 mg total) by mouth 3 (three) times daily.   90 capsule   3   . glipiZIDE (GLUCOTROL) 10 MG tablet   Oral   Take 5 mg by mouth every evening.          Juan Ray lisinopril (PRINIVIL,ZESTRIL) 5 MG tablet   Oral   Take 1 tablet (5 mg total) by mouth daily.   30 tablet   3   . metFORMIN (GLUCOPHAGE) 500 MG tablet   Oral   Take 500 mg by mouth 2 (two) times daily with a meal.         . metoprolol tartrate (LOPRESSOR) 12.5 mg TABS   Oral   Take 0.5 tablets (12.5 mg total) by mouth 2 (two) times daily.   30 tablet   4   . nitroGLYCERIN (NITROSTAT) 0.4 MG SL tablet   Sublingual   Place 1 tablet (0.4 mg total) under the tongue every 5 (five) minutes x 3 doses as needed for chest pain.   25 tablet   3   . sucralfate (CARAFATE) 1 G tablet   Oral   Take 1 tablet (1 g total) by mouth 4 (four) times daily.   120 tablet   0    BP 142/84  Pulse 89  Temp(Src) 97.7  F (36.5 C) (Oral)  Resp 20  Ht 5\' 9"  (1.753 m)  Wt 245 lb (111.131 kg)  BMI 36.16 kg/m2  SpO2 99% Physical Exam  Nursing note and vitals reviewed. Constitutional: He is oriented to person, place, and time. He appears well-developed and well-nourished. No distress.  HENT:  Head: Normocephalic and atraumatic.  Mouth/Throat: Uvula is midline, oropharynx is clear and moist and mucous membranes are normal.  Eyes: EOM are normal.  Neck: Neck supple.  Cardiovascular: Normal rate and regular rhythm.   Pulmonary/Chest: Effort normal and breath sounds normal. He has no wheezes.  Abdominal: Soft. Bowel sounds are normal. There is no tenderness.  Musculoskeletal: Normal range of motion.  Multiple stings to left arm and bilateral lower legs with minimal swelling.  Neurological: He is alert and oriented to person, place, and time. No cranial nerve deficit.  Skin: Skin is warm and dry.  Psychiatric: He has a normal mood and affect. His behavior is normal.    ED Course: Patient given first dose of Prednisone, Benadryl and Pepcid here in the ED.  Procedures  MDM  52 y.o. male with multiple bee stings. Patient stable for discharge home without any immediate complications. No shortness of breath, wheezing or swelling.  Discussed with the patient and all questioned fully answered. He will return if any problems arise.    Medication List    TAKE these medications       diphenhydrAMINE 25 MG tablet  Commonly known as:  BENADRYL  Take 1 tablet (25 mg total) by mouth every 6 (six) hours.     famotidine 20 MG tablet  Commonly known as:  PEPCID  Take 1 tablet (20 mg total) by mouth 2 (two) times daily.     predniSONE 10 MG tablet  Commonly known as:  DELTASONE  Starting 09/05/12 take 5 tablets day one and then 4, 3, 2, 1      ASK your doctor about these medications       aspirin 81 MG EC tablet  Take 1 tablet (81 mg total) by mouth daily.     atorvastatin 80 MG tablet  Commonly known as:   LIPITOR  Take 1 tablet (80 mg  total) by mouth daily at 6 PM.     clopidogrel 75 MG tablet  Commonly known as:  PLAVIX  Take 1 tablet (75 mg total) by mouth daily.     cyclobenzaprine 10 MG tablet  Commonly known as:  FLEXERIL  Take 1 tablet (10 mg total) by mouth 3 (three) times daily as needed for muscle spasms.     dexlansoprazole 60 MG capsule  Commonly known as:  DEXILANT  Take 1 capsule (60 mg total) by mouth daily.     gabapentin 300 MG capsule  Commonly known as:  NEURONTIN  Take 1 capsule (300 mg total) by mouth 3 (three) times daily.     glipiZIDE 10 MG tablet  Commonly known as:  GLUCOTROL  Take 5 mg by mouth every evening.     lisinopril 5 MG tablet  Commonly known as:  PRINIVIL,ZESTRIL  Take 1 tablet (5 mg total) by mouth daily.     metFORMIN 500 MG tablet  Commonly known as:  GLUCOPHAGE  Take 500 mg by mouth 2 (two) times daily with a meal.     metoprolol tartrate 12.5 mg Tabs tablet  Commonly known as:  LOPRESSOR  Take 0.5 tablets (12.5 mg total) by mouth 2 (two) times daily.     nitroGLYCERIN 0.4 MG SL tablet  Commonly known as:  NITROSTAT  Place 1 tablet (0.4 mg total) under the tongue every 5 (five) minutes x 3 doses as needed for chest pain.     sucralfate 1 G tablet  Commonly known as:  CARAFATE  Take 1 tablet (1 g total) by mouth 4 (four) times daily.           Janne Napoleon, [place] 09/04/12 [phone]

## 2012-09-04 NOTE — ED Notes (Signed)
Mowed over a next of stinging hornets or jellow jackets Multiple stings to feet, legs hands, arms face.  Took two tablespoonfuls of childrens benadryl before coming to ED.  C/o severe pain

## 2012-09-04 NOTE — ED Provider Notes (Signed)
Medical screening examination/treatment/procedure(s) were performed by non-physician practitioner and as supervising physician I was immediately available for consultation/collaboration.  Annemarie Sebree, MD 09/04/12 2333 

## 2012-09-04 NOTE — ED Notes (Addendum)
Mult bee stings to arms and legs   Took liquid benadryl pta

## 2012-09-07 ENCOUNTER — Other Ambulatory Visit: Payer: Self-pay | Admitting: Family Medicine

## 2012-10-15 ENCOUNTER — Other Ambulatory Visit: Payer: Self-pay | Admitting: Family Medicine

## 2012-10-17 ENCOUNTER — Other Ambulatory Visit: Payer: Self-pay | Admitting: Family Medicine

## 2012-10-19 NOTE — Telephone Encounter (Signed)
Medication refilled per protocol. 

## 2012-11-11 ENCOUNTER — Ambulatory Visit (INDEPENDENT_AMBULATORY_CARE_PROVIDER_SITE_OTHER): Payer: Medicaid Other | Admitting: Cardiovascular Disease

## 2012-11-11 ENCOUNTER — Encounter: Payer: Self-pay | Admitting: Cardiovascular Disease

## 2012-11-11 ENCOUNTER — Ambulatory Visit (HOSPITAL_COMMUNITY): Payer: Medicaid Other | Attending: Cardiology | Admitting: Cardiology

## 2012-11-11 ENCOUNTER — Encounter: Payer: Self-pay | Admitting: Cardiology

## 2012-11-11 VITALS — BP 150/84 | HR 73 | Ht 70.0 in | Wt 260.0 lb

## 2012-11-11 DIAGNOSIS — I252 Old myocardial infarction: Secondary | ICD-10-CM | POA: Insufficient documentation

## 2012-11-11 DIAGNOSIS — E785 Hyperlipidemia, unspecified: Secondary | ICD-10-CM

## 2012-11-11 DIAGNOSIS — I2119 ST elevation (STEMI) myocardial infarction involving other coronary artery of inferior wall: Secondary | ICD-10-CM

## 2012-11-11 DIAGNOSIS — I2510 Atherosclerotic heart disease of native coronary artery without angina pectoris: Secondary | ICD-10-CM

## 2012-11-11 DIAGNOSIS — R079 Chest pain, unspecified: Secondary | ICD-10-CM | POA: Insufficient documentation

## 2012-11-11 MED ORDER — METOPROLOL TARTRATE 25 MG PO TABS: 25.0000 mg | ORAL_TABLET | Freq: Two times a day (BID) | ORAL | Status: DC

## 2012-11-11 NOTE — Progress Notes (Signed)
HPI:  52 year old gentleman presenting for followup evaluation. He has coronary artery disease and initially presented in April 2014 with an inferoposterior ST elevation MI. He underwent primary PCI with stenting of the left circumflex. His post MI left ventricular ejection fraction was 45%. He had moderate residual coronary disease with medical therapy recommended. A followup nuclear scan showed a large inferolateral scar without significant ischemia. An echocardiogram was performed today and I have reviewed these images. The formal interpretation is pending.  The patient is doing fairly well. He is working a lot. 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 His diet has not been as good. He is gaining some weight back. He denies chest pain, shortness of breath, or leg swelling. He misses his medications on occasion, but overall his compliant.  Outpatient Encounter Prescriptions as of 11/11/2012  Medication Sig  . aspirin EC 81 MG EC tablet Take 1 tablet (81 mg total) by mouth daily.  Marland Kitchen atorvastatin (LIPITOR) 80 MG tablet Take 1 tablet (80 mg total) by mouth daily at 6 PM.  . clopidogrel (PLAVIX) 75 MG tablet Take 1 tablet (75 mg total) by mouth daily.  . cyclobenzaprine (FLEXERIL) 10 MG tablet Take 1 tablet (10 mg total) by mouth 3 (three) times daily as needed for muscle spasms.  Marland Kitchen dexlansoprazole (DEXILANT) 60 MG capsule Take 1 capsule (60 mg total) by mouth daily.  Marland Kitchen gabapentin (NEURONTIN) 300 MG capsule Take 1 capsule (300 mg total) by mouth 3 (three) times daily.  Marland Kitchen glipiZIDE (GLUCOTROL) 10 MG tablet Take 5 mg by mouth every evening.   Marland Kitchen lisinopril (PRINIVIL,ZESTRIL) 5 MG tablet TAKE ONE TABLET BY MOUTH EVERY DAY  . metFORMIN (GLUCOPHAGE) 1000 MG tablet TAKE ONE TABLET BY MOUTH TWICE DAILY  . metoprolol tartrate (LOPRESSOR) 12.5 mg TABS Take 0.5 tablets (12.5 mg total) by mouth 2 (two) times daily.  . nitroGLYCERIN (NITROSTAT) 0.4 MG SL tablet Place 1 tablet (0.4 mg total) under the  tongue every 5 (five) minutes x 3 doses as needed for chest pain.  Marland Kitchen sucralfate (CARAFATE) 1 G tablet TAKE ONE TABLET BY MOUTH 4 TIMES DAILY  . [DISCONTINUED] diphenhydrAMINE (BENADRYL) 25 MG tablet Take 1 tablet (25 mg total) by mouth every 6 (six) hours.  . [DISCONTINUED] famotidine (PEPCID) 20 MG tablet Take 1 tablet (20 mg total) by mouth 2 (two) times daily.  . [DISCONTINUED] metFORMIN (GLUCOPHAGE) 500 MG tablet Take 500 mg by mouth 2 (two) times daily with a meal.  . [DISCONTINUED] predniSONE (DELTASONE) 10 MG tablet Starting 09/05/12 take 5 tablets day one and then 4, 3, 2, 1    No Known Allergies  Past Medical History  Diagnosis Date  . Diabetes mellitus   . High cholesterol   . Hypertension   . Pancreatitis     a. mild by CT 06/2011  . Obesity   . Peptic ulcer disease     a. 06/2009 EGD: multiple gastric and duodenal ulcers.  Marland Kitchen CAD (coronary artery disease) 04/07/2012    Inferoposterior STEMI s/p DES-mid LCx  . Myocardial infarction     ROS: Negative except as per HPI  BP 150/84  Pulse 73  Ht 5\' 10"  (1.778 m)  Wt 260 lb (117.935 kg)  BMI 37.31 kg/m2  PHYSICAL EXAM: Pt is alert and oriented, pleasant obese male in NAD HEENT: normal Neck: JVP - normal, carotids 2+= without bruits Lungs: CTA bilaterally CV: RRR without murmur or gallop Abd: soft, NT, Positive BS, no hepatomegaly, obese  Ext: no C/C/E, distal pulses intact and equal Skin: warm/dry no rash  ASSESSMENT AND PLAN: 1. Coronary atherosclerosis, native vessel. The patient is stable without anginal symptoms. He will remain on dual antiplatelet therapy with aspirin and Plavix. Will increase his metoprolol to 25 mg twice daily. He will remain on lisinopril 5 mg daily. An echocardiogram was performed today because of his known LV dysfunction following myocardial infarction. I have personally reviewed his echo images. There is severe hypokinesis of the inferolateral wall. The other LV segments appear to contract  normally. I do not appreciate any significant valvular disease. I would estimate his left ventricular ejection fraction at 45%.  2. Hyperlipidemia. The patient takes atorvastatin 80 mg. Will repeat lipids and LFTs. Weight loss strategies were reviewed.  3. Hypertension. Blood pressure has been well-controlled at home. He ran out of medicines a few days ago. Will increase metoprolol to leave lisinopril dose at 5 mg daily.   For followup I would like to see him back in 6 months. Will consider discontinuation of Plavix at that time. I will contact him if there is anything I missed on his echocardiogram after it is formally interpreted.  Tonny Bollman 11/11/2012 2:27 PM

## 2012-11-11 NOTE — Progress Notes (Signed)
Echo performed. 

## 2012-11-11 NOTE — Patient Instructions (Addendum)
Your physician has recommended you make the following change in your medication: INCREASE Metoprolol Tartrate to 25mg  take one by mouth twice a day  Your physician recommends that you return for a FASTING LIPID, LIVER and BMP (11/19/12)--nothing to eat or drink after midnight, lab opens at 7:30 AM  Your physician wants you to follow-up in:  6 MONTHS with Dr Excell Seltzer.  You will receive a reminder letter in the mail two months in advance. If you don't receive a letter, please call our office to schedule the follow-up appointment.

## 2012-11-18 ENCOUNTER — Other Ambulatory Visit: Payer: Self-pay | Admitting: Family Medicine

## 2012-11-19 ENCOUNTER — Other Ambulatory Visit: Payer: Medicaid Other

## 2012-12-22 ENCOUNTER — Other Ambulatory Visit: Payer: Self-pay | Admitting: Family Medicine

## 2013-02-16 ENCOUNTER — Other Ambulatory Visit: Payer: Self-pay | Admitting: Family Medicine

## 2013-02-23 ENCOUNTER — Telehealth: Payer: Self-pay | Admitting: *Deleted

## 2013-02-23 NOTE — Telephone Encounter (Signed)
Prior Authorization began for Dexilant.

## 2013-02-27 ENCOUNTER — Telehealth: Payer: Self-pay | Admitting: Family Medicine

## 2013-02-27 MED ORDER — LISINOPRIL 5 MG PO TABS: 5.0000 mg | ORAL_TABLET | Freq: Every day | ORAL | Status: DC

## 2013-02-27 NOTE — Telephone Encounter (Addendum)
Calling states has been out of lisinopril now for 2 weeks waiting on refill.  Med was refilled.  Told wife did not see any requests in system??  Also asked about Dexilant, told waiting for PA on that.

## 2013-03-04 ENCOUNTER — Telehealth: Payer: Self-pay | Admitting: *Deleted

## 2013-03-04 NOTE — Telephone Encounter (Signed)
Received fax from Medicaid that [place] for [place] has been denied.   Patient will need to try and have therapeutic failures of (2) preferred drugs.   MD please advise.

## 2013-03-05 NOTE — Telephone Encounter (Signed)
PA resubmitted.

## 2013-03-05 NOTE — Telephone Encounter (Signed)
Please get prior auth, he failed once daily protonix and twice daily protonix.  The dexilant is working.

## 2013-03-13 MED ORDER — OMEPRAZOLE 40 MG PO CPDR: 40.0000 mg | DELAYED_RELEASE_CAPSULE | Freq: Two times a day (BID) | ORAL | Status: DC

## 2013-03-13 NOTE — Telephone Encounter (Signed)
Call placed to patient and patient made aware.   Prescription sent to pharmacy.  

## 2013-03-13 NOTE — Telephone Encounter (Signed)
He has failed protonix, start omeprazole 40 bid and see if that will help then will do the dexilant once that fails.

## 2013-03-13 NOTE — Telephone Encounter (Signed)
Call placed to [facility] to inquire about status of PA.  Reported that PA submitted on 03/05/2013 denied.   Patient must try and fail 2 preferred drugs.

## 2013-03-21 ENCOUNTER — Other Ambulatory Visit: Payer: Self-pay | Admitting: Cardiovascular Disease

## 2013-03-21 ENCOUNTER — Other Ambulatory Visit: Payer: Self-pay | Admitting: Family Medicine

## 2013-03-23 ENCOUNTER — Other Ambulatory Visit: Payer: Self-pay | Admitting: Family Medicine

## 2013-03-23 ENCOUNTER — Encounter: Payer: Self-pay | Admitting: Family Medicine

## 2013-03-23 MED ORDER — PANTOPRAZOLE SODIUM 40 MG PO TBEC: 40.0000 mg | DELAYED_RELEASE_TABLET | Freq: Two times a day (BID) | ORAL | Status: DC

## 2013-03-23 NOTE — Telephone Encounter (Signed)
Medication refill for one time only.  Patient needs to be seen.  Letter sent for patient to call and schedule 

## 2013-03-25 ENCOUNTER — Other Ambulatory Visit: Payer: Self-pay | Admitting: Family Medicine

## 2013-03-25 MED ORDER — PANTOPRAZOLE SODIUM 40 MG PO TBEC: 40.0000 mg | DELAYED_RELEASE_TABLET | Freq: Two times a day (BID) | ORAL | Status: DC

## 2013-05-18 ENCOUNTER — Other Ambulatory Visit: Payer: Self-pay | Admitting: Family Medicine

## 2013-06-03 ENCOUNTER — Other Ambulatory Visit: Payer: Self-pay | Admitting: Family Medicine

## 2013-06-08 ENCOUNTER — Ambulatory Visit: Payer: Medicaid Other | Admitting: Physician Assistant

## 2013-07-01 ENCOUNTER — Other Ambulatory Visit: Payer: Self-pay | Admitting: Family Medicine

## 2013-07-02 ENCOUNTER — Ambulatory Visit (INDEPENDENT_AMBULATORY_CARE_PROVIDER_SITE_OTHER): Payer: Medicaid Other | Admitting: Physician Assistant

## 2013-07-02 ENCOUNTER — Encounter: Payer: Self-pay | Admitting: Physician Assistant

## 2013-07-02 VITALS — BP 130/71 | HR 72 | Ht 70.0 in | Wt 274.0 lb

## 2013-07-02 DIAGNOSIS — E785 Hyperlipidemia, unspecified: Secondary | ICD-10-CM

## 2013-07-02 DIAGNOSIS — I2589 Other forms of chronic ischemic heart disease: Secondary | ICD-10-CM

## 2013-07-02 DIAGNOSIS — I2510 Atherosclerotic heart disease of native coronary artery without angina pectoris: Secondary | ICD-10-CM

## 2013-07-02 DIAGNOSIS — I255 Ischemic cardiomyopathy: Secondary | ICD-10-CM | POA: Insufficient documentation

## 2013-07-02 DIAGNOSIS — I10 Essential (primary) hypertension: Secondary | ICD-10-CM

## 2013-07-02 NOTE — Progress Notes (Signed)
Cardiology Office Note    Date:  07/02/2013   ID:  Juan Ray, DOB 09-09-1960, MRN [id number]  PCP:  Odette Fraction, MD  Cardiologist:  Dr. Sherren Mocha      History of Present Illness: Juan Ray is a 53 y.o. male with a hx of CAD, DM2, HTN, HL, PUD. He suffered an inf-post STEMI 04/2012 treated with Promus DES to [facility]. Residual disease treated medically. Follow up myoview demonstrated inf-lat scar, no ischemia, EF 39%. Med Rx continued.  Last seen by Dr. Sherren Mocha in 11/2012.  He returns for follow up.  He is doing well.  The patient denies chest pain, shortness of breath, syncope, orthopnea, PND or significant pedal edema.    Studies:  - LHC (4/14): pLAD 70%, oDx 70-80%, mid to dist LAD 70%, pOM1 80%, mCFX occluded, RCA with CTO, inf HK, EF 45%. PCI: Promus DES to [place]. Residual disease treated medically.   - Echo (11/2012):  Mild LVH, EF 40-45%, inf and post AK, Gr 1 DD, mild LAE  - Myoview (4/14) done b/c of residual CAD: intermediate risk, inf-lat scar, no ischemia, EF 39%. Med Rx continued.    Recent Labs: 07/10/2012: Potassium 4.5; Pro B Natriuretic peptide (BNP) 480.6*  07/11/2012: Creatinine 0.87; Hemoglobin 14.7   Wt Readings from Last 3 Encounters:  11/11/12 260 lb (117.935 kg)  09/04/12 245 lb (111.131 kg)  07/29/12 252 lb (114.306 kg)     Past Medical History  Diagnosis Date  . Diabetes mellitus   . High cholesterol   . Hypertension   . Pancreatitis     a. mild by CT 06/2011  . Obesity   . Peptic ulcer disease     a. 06/2009 EGD: multiple gastric and duodenal ulcers.  Marland Kitchen CAD (coronary artery disease) 04/07/2012    Inferoposterior STEMI s/p DES-mid LCx  . Myocardial infarction     Current Outpatient Prescriptions  Medication Sig Dispense Refill  . aspirin EC 81 MG EC tablet Take 1 tablet (81 mg total) by mouth daily.      Marland Kitchen atorvastatin (LIPITOR) 80 MG tablet TAKE ONE TABLET BY MOUTH ONCE DAILY AT SIX IN THE EVENING  30 tablet  3  .  clopidogrel (PLAVIX) 75 MG tablet Take 1 tablet (75 mg total) by mouth daily.  30 tablet  11  . cyclobenzaprine (FLEXERIL) 10 MG tablet Take 1 tablet (10 mg total) by mouth 3 (three) times daily as needed for muscle spasms.  90 tablet  1  . gabapentin (NEURONTIN) 300 MG capsule TAKE ONE CAPSULE BY MOUTH THREE TIMES DAILY  90 capsule  5  . glipiZIDE (GLUCOTROL) 10 MG tablet Take 5 mg by mouth every evening.       Marland Kitchen glipiZIDE (GLUCOTROL) 10 MG tablet TAKE ONE TABLET BY MOUTH TWICE DAILY BEFORE A MEAL  60 tablet  5  . lisinopril (PRINIVIL,ZESTRIL) 5 MG tablet Take 1 tablet (5 mg total) by mouth daily.  30 tablet  3  . metFORMIN (GLUCOPHAGE) 1000 MG tablet TAKE ONE TABLET BY MOUTH TWICE DAILY  60 tablet  0  . metoprolol tartrate (LOPRESSOR) 25 MG tablet Take 1 tablet (25 mg total) by mouth 2 (two) times daily.  60 tablet  8  . nitroGLYCERIN (NITROSTAT) 0.4 MG SL tablet Place 1 tablet (0.4 mg total) under the tongue every 5 (five) minutes x 3 doses as needed for chest pain.  25 tablet  3  . pantoprazole (PROTONIX) 40 MG tablet Take 1 tablet (40 mg  total) by mouth 2 (two) times daily.  60 tablet  11  . sucralfate (CARAFATE) 1 G tablet TAKE ONE TABLET BY MOUTH 4 TIMES DAILY  120 tablet  0  . sucralfate (CARAFATE) 1 G tablet TAKE ONE TABLET BY MOUTH 4 TIMES DAILY  120 tablet  0   No current facility-administered medications for this visit.    Allergies:   Review of patient's allergies indicates no known allergies.   Social History:  The patient  reports that he has quit smoking. He quit smokeless tobacco use about 15 years ago. He reports that he does not drink alcohol or use illicit drugs.   Family History:  The patient's family history includes Heart attack in his father.   ROS:  Please see the history of present illness.      All other systems reviewed and negative.   PHYSICAL EXAM: VS:  BP 130/71  Pulse 72  Ht 5\' 10"  (1.778 m)  Wt 274 lb (124.286 kg)  BMI 39.32 kg/m2 Well nourished, well  developed, in no acute distress HEENT: normal Neck: no JVD Vascular:  No carotid bruits Cardiac:  normal S1, S2; RRR; no murmur Lungs:  clear to auscultation bilaterally, no wheezing, rhonchi or rales Abd: soft, nontender, no hepatomegaly Ext: no edema Skin: warm and dry Neuro:  CNs 2-12 intact, no focal abnormalities noted  EKG:  NSR, HR 72, inferior Q waves, incomplete RBBB, inferior T wave inversions, no significant change when compared to prior tracing    ASSESSMENT AND PLAN:  1. CAD s/p inf-post STEMI tx with DES to [facility] in 04/2012:  No angina.  Continue ASA, Plavix, beta blocker and statin.  I will review with Dr. Burt Knack +/- stopping Plavix now that he is > 12 mos out from his MI/PCI.   2. Ischemic cardiomyopathy:  Continue beta blocker, ACEI.  EF stable by echo in 11/2012.   3. Essential hypertension:  Controlled.  Arrange f/u BMET. 4. Hyperlipidemia:  Continue high dose statin.  Arrange f/u fasting Lipids and LFTs.  5. Disposition:  F/u with Dr. Sherren Mocha in 6 mos.    Signed, Versie Starks, MHS 07/02/2013 10:18 AM    [facility] [place], [place], [place]  [address] Phone: [phone]; Fax: [phone]

## 2013-07-02 NOTE — Patient Instructions (Signed)
YOU HAVE AN ORDER IN TO COMPUTER ALREADY FOR FASTING LAB WORK TO BE DONE; BMET, FASTING LIPID AND LIVER PANEL  Your physician wants you to follow-up in: [place] DR. Burt Knack. You will receive a reminder letter in the mail two months in advance. If you don't receive a letter, please call our office to schedule the follow-up appointment.

## 2013-07-06 ENCOUNTER — Telehealth: Payer: Self-pay | Admitting: Physician Assistant

## 2013-07-06 ENCOUNTER — Telehealth: Payer: Self-pay | Admitting: *Deleted

## 2013-07-06 NOTE — Telephone Encounter (Signed)
could not lmom on pt's phone #, lmom on wife's phone # for pt to remain on both ASA and Plavix per Dr. Burt Knack and Brynda Rim. PA and new guidelines showing better benefits staying on both longer. Any questions call [phone].

## 2013-07-06 NOTE — Telephone Encounter (Signed)
Please tell Juan Ray that I reviewed his case with Dr. Sherren Mocha. He prefers he remain on ASA and Plavix longer given recent trial data demonstrating a benefit to remaining on both drugs longer. So, please tell patient to remain on Plavix and ASA for now. Richardson Dopp, PA-C   07/06/2013 5:07 PM

## 2013-07-06 NOTE — Telephone Encounter (Signed)
could not lmom on pt's phone #, lmom on wife's phone # for pt to remain on both ASA and Plavix per Dr. Burt Knack and Brynda Rim. PA and new guidelines showing better benefits staying on both longer. Any questions call [phone].

## 2013-07-11 ENCOUNTER — Other Ambulatory Visit: Payer: Self-pay | Admitting: *Deleted

## 2013-07-11 DIAGNOSIS — E119 Type 2 diabetes mellitus without complications: Secondary | ICD-10-CM

## 2013-07-11 DIAGNOSIS — I10 Essential (primary) hypertension: Secondary | ICD-10-CM

## 2013-07-11 DIAGNOSIS — E785 Hyperlipidemia, unspecified: Secondary | ICD-10-CM

## 2013-07-14 ENCOUNTER — Other Ambulatory Visit: Payer: Self-pay

## 2013-07-14 DIAGNOSIS — E119 Type 2 diabetes mellitus without complications: Secondary | ICD-10-CM

## 2013-07-14 DIAGNOSIS — E785 Hyperlipidemia, unspecified: Secondary | ICD-10-CM

## 2013-07-14 DIAGNOSIS — I10 Essential (primary) hypertension: Secondary | ICD-10-CM

## 2013-07-14 LAB — LIPID PANEL
CHOL/HDL RATIO: 3.6 ratio
CHOLESTEROL: 135 mg/dL (ref 0–200)
HDL: 38 mg/dL — AB (ref >39)
LDL Cholesterol: 64 mg/dL (ref 0–99)
TRIGLYCERIDES: 163 mg/dL — AB (ref <150)
VLDL: 33 mg/dL (ref 0–40)

## 2013-07-14 LAB — COMPLETE METABOLIC PANEL WITH GFR
ALBUMIN: 4 g/dL (ref 3.5–5.2)
ALK PHOS: 81 U/L (ref 39–117)
ALT: 33 U/L (ref 0–53)
AST: 21 U/L (ref 0–37)
BUN: 17 mg/dL (ref 6–23)
CO2: 30 mEq/L (ref 19–32)
CREATININE: 0.92 mg/dL (ref 0.50–1.35)
Calcium: 9 mg/dL (ref 8.4–10.5)
Chloride: 102 mEq/L (ref 96–112)
GFR, Est African American: >89 mL/min
GLUCOSE: 164 mg/dL — AB (ref 70–99)
POTASSIUM: 4.4 meq/L (ref 3.5–5.3)
Sodium: 141 mEq/L (ref 135–145)
Total Bilirubin: 0.8 mg/dL (ref 0.2–1.2)
Total Protein: 6.4 g/dL (ref 6.0–8.3)

## 2013-07-14 LAB — CBC WITH DIFFERENTIAL/PLATELET
Basophils Absolute: 0.1 10*3/uL (ref 0.0–0.1)
Basophils Relative: 1 % (ref 0–1)
Eosinophils Absolute: 0.2 10*3/uL (ref 0.0–0.7)
Eosinophils Relative: 3 % (ref 0–5)
HCT: 43.8 % (ref 39.0–52.0)
HEMOGLOBIN: 15.4 g/dL (ref 13.0–17.0)
LYMPHS ABS: 2.9 10*3/uL (ref 0.7–4.0)
LYMPHS PCT: 35 % (ref 12–46)
MCH: 32.2 pg (ref 26.0–34.0)
MCHC: 35.2 g/dL (ref 30.0–36.0)
MCV: 91.4 fL (ref 78.0–100.0)
MONOS PCT: 15 % — AB (ref 3–12)
Monocytes Absolute: 1.2 10*3/uL — ABNORMAL HIGH (ref 0.1–1.0)
NEUTROS ABS: 3.8 10*3/uL (ref 1.7–7.7)
NEUTROS PCT: 46 % (ref 43–77)
PLATELETS: 231 10*3/uL (ref 150–400)
RBC: 4.79 MIL/uL (ref 4.22–5.81)
RDW: 13.4 % (ref 11.5–15.5)
WBC: 8.2 10*3/uL (ref 4.0–10.5)

## 2013-07-14 LAB — HEMOGLOBIN A1C
HEMOGLOBIN A1C: 8.2 % — AB (ref <5.7)
MEAN PLASMA GLUCOSE: 189 mg/dL — AB (ref <117)

## 2013-07-14 LAB — MICROALBUMIN / CREATININE URINE RATIO
Creatinine, Urine: 180.9 mg/dL
MICROALB/CREAT RATIO: 3.8 mg/g (ref 0.0–30.0)
Microalb, Ur: 0.68 mg/dL (ref 0.00–1.89)

## 2013-07-20 ENCOUNTER — Ambulatory Visit: Payer: Medicaid Other | Admitting: Family Medicine

## 2013-08-06 ENCOUNTER — Ambulatory Visit (INDEPENDENT_AMBULATORY_CARE_PROVIDER_SITE_OTHER): Payer: Medicaid Other | Admitting: Family Medicine

## 2013-08-06 ENCOUNTER — Encounter: Payer: Self-pay | Admitting: Family Medicine

## 2013-08-06 VITALS — BP 128/74 | HR 80 | Temp 98.1°F | Resp 18 | Ht 70.0 in | Wt 274.0 lb

## 2013-08-06 DIAGNOSIS — E1165 Type 2 diabetes mellitus with hyperglycemia: Principal | ICD-10-CM

## 2013-08-06 DIAGNOSIS — K219 Gastro-esophageal reflux disease without esophagitis: Secondary | ICD-10-CM

## 2013-08-06 DIAGNOSIS — IMO0001 Reserved for inherently not codable concepts without codable children: Secondary | ICD-10-CM

## 2013-08-06 MED ORDER — CANAGLIFLOZIN 300 MG PO TABS: 300.0000 mg | ORAL_TABLET | Freq: Every morning | ORAL | Status: DC

## 2013-08-06 MED ORDER — DEXLANSOPRAZOLE 60 MG PO CPDR: 60.0000 mg | DELAYED_RELEASE_CAPSULE | Freq: Every day | ORAL | Status: DC

## 2013-08-06 NOTE — Progress Notes (Signed)
Subjective:    Patient ID: Juan Ray, male    DOB: 11-23-1960, 53 y.o.   MRN: [id number]  HPI Patient is a 53 year old white male who is here today for followup of his diabetes mellitus type 2. His most recent lab work is listed below: Lab on 07/14/2013  Component Date Value Ref Range Status  . Microalb, Ur 07/14/2013 0.68  0.00 - 1.89 mg/dL Final  . Creatinine, Urine 07/14/2013 180.9   Final  . Microalb Creat Ratio 07/14/2013 3.8  0.0 - 30.0 mg/g Final  . Hemoglobin A1C 07/14/2013 8.2* <5.7 % Final   Comment:                                                                                                 According to the ADA Clinical Practice Recommendations for 2011, when                          HbA1c is used as a screening test:                                                       >=6.5%   Diagnostic of Diabetes Mellitus                                     (if abnormal result is confirmed)                                                     5.7-6.4%   Increased risk of developing Diabetes Mellitus                                                     References:Diagnosis and Classification of Diabetes Mellitus,Diabetes                          URKY,7062,37(SEGBT 1):S62-S69 and Standards of Medical Care in                                  Diabetes - 2011,Diabetes Care,2011,34 (Suppl 1):S11-S61.                             . Mean Plasma Glucose 07/14/2013 189* <117 mg/dL Final  . WBC 07/14/2013 8.2  4.0 - 10.5 K/uL Final  . RBC 07/14/2013 4.79  4.22 - 5.81 MIL/uL Final  . Hemoglobin 07/14/2013 15.4  13.0 - 17.0 g/dL Final  . HCT 07/14/2013 43.8  39.0 - 52.0 % Final  . MCV 07/14/2013 91.4  78.0 - 100.0 fL Final  . MCH 07/14/2013 32.2  26.0 - 34.0 pg Final  . MCHC 07/14/2013 35.2  30.0 - 36.0 g/dL Final  . RDW 07/14/2013 13.4  11.5 - 15.5 % Final  . Platelets 07/14/2013 231  150 - 400 K/uL Final  . Neutrophils Relative % 07/14/2013 46  43 - 77 % Final  . Neutro Abs  07/14/2013 3.8  1.7 - 7.7 K/uL Final  . Lymphocytes Relative 07/14/2013 35  12 - 46 % Final  . Lymphs Abs 07/14/2013 2.9  0.7 - 4.0 K/uL Final  . Monocytes Relative 07/14/2013 15* 3 - 12 % Final  . Monocytes Absolute 07/14/2013 1.2* 0.1 - 1.0 K/uL Final  . Eosinophils Relative 07/14/2013 3  0 - 5 % Final  . Eosinophils Absolute 07/14/2013 0.2  0.0 - 0.7 K/uL Final  . Basophils Relative 07/14/2013 1  0 - 1 % Final  . Basophils Absolute 07/14/2013 0.1  0.0 - 0.1 K/uL Final  . Smear Review 07/14/2013 SEE NOTE   Final   Comment:                            RBC, Platelet and WBC Morphology unremarkable  . Cholesterol 07/14/2013 135  0 - 200 mg/dL Final   Comment: ATP III Classification:                                < 200        mg/dL        Desirable                               200 - 239     mg/dL        Borderline High                               >= 240        mg/dL        High                             . Triglycerides 07/14/2013 163* <150 mg/dL Final  . HDL 07/14/2013 38* >39 mg/dL Final  . Total CHOL/HDL Ratio 07/14/2013 3.6   Final  . VLDL 07/14/2013 33  0 - 40 mg/dL Final  . LDL Cholesterol 07/14/2013 64  0 - 99 mg/dL Final   Comment:                            Total Cholesterol/HDL Ratio:CHD Risk                                                 Coronary Heart Disease Risk Table  Men       Women                                   1/2 Average Risk              3.4        3.3                                       Average Risk              5.0        4.4                                    2X Average Risk              9.6        7.1                                    3X Average Risk             23.4       11.0                          Use the calculated Patient Ratio above and the CHD Risk table                           to determine the patient's CHD Risk.                          ATP III Classification (LDL):                                 < 100        mg/dL         Optimal                               100 - 129     mg/dL         Near or Above Optimal                               130 - 159     mg/dL         Borderline High                               160 - 189     mg/dL         High                                > 190        mg/dL         Very High                             .  Sodium 07/14/2013 141  135 - 145 mEq/L Final  . Potassium 07/14/2013 4.4  3.5 - 5.3 mEq/L Final  . Chloride 07/14/2013 102  96 - 112 mEq/L Final  . CO2 07/14/2013 30  19 - 32 mEq/L Final  . Glucose, Bld 07/14/2013 164* 70 - 99 mg/dL Final  . BUN 07/14/2013 17  6 - 23 mg/dL Final  . Creat 07/14/2013 0.92  0.50 - 1.35 mg/dL Final  . Total Bilirubin 07/14/2013 0.8  0.2 - 1.2 mg/dL Final  . Alkaline Phosphatase 07/14/2013 81  39 - 117 U/L Final  . AST 07/14/2013 21  0 - 37 U/L Final  . ALT 07/14/2013 33  0 - 53 U/L Final  . Total Protein 07/14/2013 6.4  6.0 - 8.3 g/dL Final  . Albumin 07/14/2013 4.0  3.5 - 5.2 g/dL Final  . Calcium 07/14/2013 9.0  8.4 - 10.5 mg/dL Final  . GFR, Est African American 07/14/2013 >89   Final  . GFR, Est Non African American 07/14/2013 >89   Final   Comment:                            The estimated GFR is a calculation valid for adults (>=35 years old)                          that uses the CKD-EPI algorithm to adjust for age and sex. It is                            not to be used for children, pregnant women, hospitalized patients,                             patients on dialysis, or with rapidly changing kidney function.                          According to the NKDEP, eGFR >89 is normal, 60-89 shows mild                          impairment, 30-59 shows moderate impairment, 15-29 shows severe                          impairment and <15 is ESRD.                              He continues to remain overweight. He is not losing any weight. He is not following a low carbohydrate diet. He is not  getting any regular aerobic exercise. He denies any chest pain shortness of breath or dyspnea on exertion. See above A1c is still significantly elevated. Fortunately his blood pressure his cholesterol excellent. Unfortunately the patient continues to have problems with acid reflux, indigestion, and epigastric abdominal discomfort. He is currently taking protonix and carafate but he continues to have breakthrough symptoms. He is also felt over the counter omeprazole. He can no longer take omeprazole due to the fact he is on Plavix for his coronary artery disease. He has previously taken dexilant which worked exceptionally well. Past Medical History  Diagnosis Date  . Diabetes mellitus   . High cholesterol   . Hypertension   .  Pancreatitis     a. mild by CT 06/2011  . Obesity   . Peptic ulcer disease     a. 06/2009 EGD: multiple gastric and duodenal ulcers.  Marland Kitchen CAD (coronary artery disease) 04/07/2012    Inferoposterior STEMI s/p DES-mid LCx  . Myocardial infarction    Past Surgical History  Procedure Laterality Date  . Esophagogastroduodenoscopy  06/18/2011    NL ESOPHAGUS/UlcerATED LESIONS/ SUPERFICIAL Ulcers  . Coronary angioplasty with stent placement  04/07/2012    70% prox LAD, 70-80% ostial diagonal, 70% mid-distal LAD, 80% prox OM1, mid LCx totally occluded just distal to OM1 s/p DES, occluded RCA; severe inferior HK, LVEF 45%   Current Outpatient Prescriptions on File Prior to Visit  Medication Sig Dispense Refill  . aspirin EC 81 MG EC tablet Take 1 tablet (81 mg total) by mouth daily.      Marland Kitchen atorvastatin (LIPITOR) 80 MG tablet TAKE ONE TABLET BY MOUTH ONCE DAILY AT SIX IN THE EVENING  30 tablet  3  . clopidogrel (PLAVIX) 75 MG tablet Take 1 tablet (75 mg total) by mouth daily.  30 tablet  11  . cyclobenzaprine (FLEXERIL) 10 MG tablet Take 1 tablet (10 mg total) by mouth 3 (three) times daily as needed for muscle spasms.  90 tablet  1  . gabapentin (NEURONTIN) 300 MG capsule TAKE ONE  CAPSULE BY MOUTH THREE TIMES DAILY  90 capsule  5  . glipiZIDE (GLUCOTROL) 10 MG tablet TAKE ONE TABLET BY MOUTH TWICE DAILY BEFORE A MEAL  60 tablet  5  . lisinopril (PRINIVIL,ZESTRIL) 5 MG tablet Take 1 tablet (5 mg total) by mouth daily.  30 tablet  3  . metFORMIN (GLUCOPHAGE) 1000 MG tablet TAKE ONE TABLET BY MOUTH TWICE DAILY  60 tablet  0  . metoprolol tartrate (LOPRESSOR) 25 MG tablet Take 1 tablet (25 mg total) by mouth 2 (two) times daily.  60 tablet  8  . nitroGLYCERIN (NITROSTAT) 0.4 MG SL tablet Place 1 tablet (0.4 mg total) under the tongue every 5 (five) minutes x 3 doses as needed for chest pain.  25 tablet  3  . pantoprazole (PROTONIX) 40 MG tablet Take 1 tablet (40 mg total) by mouth 2 (two) times daily.  60 tablet  11  . sucralfate (CARAFATE) 1 G tablet TAKE ONE TABLET BY MOUTH 4 TIMES DAILY  120 tablet  0   No current facility-administered medications on file prior to visit.   No Known Allergies History   Social History  . Marital Status: Married    Spouse Name: N/A    Number of Children: N/A  . Years of Education: N/A   Occupational History  . Not on file.   Social History Main Topics  . Smoking status: Former Research scientist (life sciences)  . Smokeless tobacco: Former Systems developer    Quit date: 01/01/1998  . Alcohol Use: No  . Drug Use: No  . Sexual Activity: Yes   Other Topics Concern  . Not on file   Social History Narrative   Lives in [place], with his wife and 18 yr old son.     Review of Systems  All other systems reviewed and are negative.      Objective:   Physical Exam  Vitals reviewed. Constitutional: He appears well-developed and well-nourished.  Neck: Neck supple. No JVD present. No thyromegaly present.  Cardiovascular: Normal rate, regular rhythm, normal heart sounds and intact distal pulses.  Exam reveals no gallop and no friction rub.   No  murmur heard. Pulmonary/Chest: Effort normal and breath sounds normal. No respiratory distress. He has no wheezes. He has  no rales.  Abdominal: Soft. Bowel sounds are normal. He exhibits no distension. There is no tenderness. There is no rebound and no guarding.  Musculoskeletal: He exhibits edema.  Lymphadenopathy:    He has no cervical adenopathy.          Assessment & Plan:  1. Type II or unspecified type diabetes mellitus without mention of complication, uncontrolled Continue current medications at their present dosages. I will also add Invokana 300 mg poqday.  I strongly encouraged the patient to perform therapeutic lifestyle changes including diet exercise and weight loss. Recheck hemoglobin A1c in 3 months along with a urine microalbumin. Currently blood pressure and cholesterol are excellent. - Canagliflozin (INVOKANA) 300 MG TABS; Take 1 tablet (300 mg total) by mouth every morning.  Dispense: 30 tablet; Refill: 5  2. Gastroesophageal reflux disease, esophagitis presence not specified Resume dexilant 60 mg poqday and dc protonix and carafate. - dexlansoprazole (DEXILANT) 60 MG capsule; Take 1 capsule (60 mg total) by mouth daily.  Dispense: 30 capsule; Refill: 5

## 2013-08-12 ENCOUNTER — Other Ambulatory Visit: Payer: Self-pay | Admitting: Family Medicine

## 2013-08-12 ENCOUNTER — Other Ambulatory Visit: Payer: Self-pay | Admitting: Cardiovascular Disease

## 2013-08-13 ENCOUNTER — Telehealth: Payer: Self-pay | Admitting: Family Medicine

## 2013-08-13 MED ORDER — METFORMIN HCL 1000 MG PO TABS: ORAL_TABLET | ORAL | Status: DC

## 2013-08-13 MED ORDER — GABAPENTIN 300 MG PO CAPS: ORAL_CAPSULE | ORAL | Status: DC

## 2013-08-13 MED ORDER — GLIPIZIDE 10 MG PO TABS: ORAL_TABLET | ORAL | Status: DC

## 2013-08-13 MED ORDER — LISINOPRIL 5 MG PO TABS: 5.0000 mg | ORAL_TABLET | Freq: Every day | ORAL | Status: DC

## 2013-08-13 NOTE — Telephone Encounter (Signed)
Spoke to pt and meds requested sent to pharm

## 2013-08-13 NOTE — Telephone Encounter (Signed)
[place]  Pt was seen last week for a med refill and they went to pharmacy to get the  meds and the pharmacy states they have not received anything yet

## 2013-09-14 ENCOUNTER — Ambulatory Visit: Payer: Medicaid Other | Admitting: Family Medicine

## 2013-09-18 ENCOUNTER — Ambulatory Visit: Payer: Medicaid Other | Admitting: Family Medicine

## 2013-09-21 ENCOUNTER — Telehealth: Payer: Self-pay | Admitting: Family Medicine

## 2013-09-21 NOTE — Telephone Encounter (Signed)
[phone]  Pt is wanting to be referred for colonscopy  -pt may try to schedule with DR Dennard Schaumann for 30 min appt biopsy but per the provider he is not to be scheduled for that do to no showing  2 times for that apt.

## 2013-09-22 ENCOUNTER — Other Ambulatory Visit: Payer: Self-pay | Admitting: Family Medicine

## 2013-09-22 DIAGNOSIS — Z7689 Persons encountering health services in other specified circumstances: Secondary | ICD-10-CM

## 2013-09-22 DIAGNOSIS — Z1211 Encounter for screening for malignant neoplasm of colon: Secondary | ICD-10-CM

## 2013-09-22 DIAGNOSIS — Z01818 Encounter for other preprocedural examination: Secondary | ICD-10-CM

## 2013-09-22 NOTE — Telephone Encounter (Signed)
lmtrc

## 2013-09-22 NOTE — Telephone Encounter (Signed)
Pt is also needing Derm appt before end of this month d/t to insurance ending

## 2013-09-23 NOTE — Telephone Encounter (Signed)
Both referral to Derm and gastro has been ordered

## 2013-09-28 ENCOUNTER — Other Ambulatory Visit: Payer: Self-pay | Admitting: Cardiovascular Disease

## 2013-09-28 ENCOUNTER — Other Ambulatory Visit: Payer: Self-pay | Admitting: Family Medicine

## 2013-09-28 NOTE — Telephone Encounter (Signed)
Med (sucralfate) has been discontinued by provider.  Refill denied

## 2013-09-29 ENCOUNTER — Other Ambulatory Visit: Payer: Self-pay | Admitting: Family Medicine

## 2013-09-29 ENCOUNTER — Telehealth: Payer: Self-pay

## 2013-09-29 NOTE — Telephone Encounter (Signed)
PT was referred from [facility] for screening colonoscopy. I called him and his insurance runs out tomorrow. He is trying to get more insurance and will call when he is ready. He is not having any problems, just needs screening because he is 53 years old.

## 2013-09-29 NOTE — Telephone Encounter (Signed)
Noted  

## 2013-09-30 NOTE — Telephone Encounter (Signed)
Sulcrafate not refilled.  Pt told to stop taking at last office visit

## 2013-10-28 ENCOUNTER — Telehealth: Payer: Self-pay | Admitting: Family Medicine

## 2013-10-28 NOTE — Telephone Encounter (Signed)
PA Submitted through [email or web address]   Has tried omeprazole, nexium, protonix with no help of symptoms

## 2013-11-04 ENCOUNTER — Other Ambulatory Visit: Payer: Self-pay | Admitting: Physician Assistant

## 2013-11-04 MED ORDER — NITROGLYCERIN 0.4 MG SL SUBL: 0.4000 mg | SUBLINGUAL_TABLET | SUBLINGUAL | Status: DC | PRN

## 2013-11-06 ENCOUNTER — Ambulatory Visit: Payer: Medicaid Other | Admitting: Family Medicine

## 2013-11-13 ENCOUNTER — Other Ambulatory Visit: Payer: Self-pay | Admitting: Family Medicine

## 2013-11-16 ENCOUNTER — Other Ambulatory Visit: Payer: Self-pay | Admitting: Cardiovascular Disease

## 2013-12-10 ENCOUNTER — Encounter (HOSPITAL_COMMUNITY): Payer: Self-pay | Admitting: Cardiovascular Disease

## 2014-01-21 ENCOUNTER — Other Ambulatory Visit: Payer: Self-pay | Admitting: Cardiovascular Disease

## 2014-03-01 ENCOUNTER — Other Ambulatory Visit: Payer: Self-pay | Admitting: Cardiovascular Disease

## 2014-03-29 ENCOUNTER — Other Ambulatory Visit: Payer: Self-pay | Admitting: Cardiovascular Disease

## 2014-04-06 ENCOUNTER — Other Ambulatory Visit: Payer: Self-pay | Admitting: Cardiovascular Disease

## 2014-05-10 ENCOUNTER — Other Ambulatory Visit: Payer: Self-pay | Admitting: Cardiovascular Disease

## 2014-05-10 ENCOUNTER — Other Ambulatory Visit: Payer: Self-pay | Admitting: Family Medicine

## 2014-05-26 ENCOUNTER — Other Ambulatory Visit: Payer: Self-pay

## 2014-05-26 MED ORDER — CLOPIDOGREL BISULFATE 75 MG PO TABS: ORAL_TABLET | ORAL | Status: DC

## 2014-06-21 ENCOUNTER — Other Ambulatory Visit: Payer: Self-pay | Admitting: Cardiovascular Disease

## 2014-09-14 ENCOUNTER — Other Ambulatory Visit: Payer: Self-pay | Admitting: Family Medicine

## 2014-09-17 ENCOUNTER — Other Ambulatory Visit: Payer: Self-pay | Admitting: *Deleted

## 2014-09-17 ENCOUNTER — Telehealth: Payer: Self-pay | Admitting: *Deleted

## 2014-09-17 MED ORDER — ATORVASTATIN CALCIUM 80 MG PO TABS: ORAL_TABLET | ORAL | Status: DC

## 2014-09-17 MED ORDER — CLOPIDOGREL BISULFATE 75 MG PO TABS: ORAL_TABLET | ORAL | Status: DC

## 2014-09-17 MED ORDER — METOPROLOL TARTRATE 25 MG PO TABS: 25.0000 mg | ORAL_TABLET | Freq: Two times a day (BID) | ORAL | Status: DC

## 2014-09-17 NOTE — Telephone Encounter (Signed)
Patients wife called and stated that the patient has lost his insurance and has not been able to come in for an appointment. He has been without the metoprolol, clopidogrel, and the atorvastatin and would like to see if Dr Burt Knack would refill them. Please advise. Thanks, MI

## 2014-09-17 NOTE — Telephone Encounter (Signed)
This is fine please refill

## 2015-01-27 ENCOUNTER — Ambulatory Visit: Payer: Self-pay | Admitting: Physician Assistant

## 2015-01-28 ENCOUNTER — Encounter: Payer: Self-pay | Admitting: Cardiovascular Disease

## 2015-02-06 NOTE — Progress Notes (Signed)
Cardiology Office Note:    Date:  02/07/2015   ID:  Juan Ray, DOB 11-Nov-1960, MRN [id number]  PCP:  Odette Fraction, MD  Cardiologist:  Dr. Sherren Mocha   Electrophysiologist:  n/a  Chief Complaint  Patient presents with  . Coronary Artery Disease    Follow up    History of Present Illness:     Juan Ray is a 55 y.o. male with a hx of CAD, DM2, HTN, HL, PUD. He suffered an inf-post STEMI 04/2012 treated with Promus DES to [facility]. Residual disease treated medically. Follow up myoview demonstrated inf-lat scar, no ischemia, EF 39%. Med Rx continued. Last seen by by me in 7/15.    He returns for FU.  Here with his wife. Unfortunately, he lost his insurance. He has been out of all his medications for couple of months now. 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 He has been unable to follow-up with primary care for his diabetes. He notes chest pressure. This is been ongoing for months. It will occur for several days in a row. He denies exertional symptoms or symptoms reminiscent of his previous angina. He is not certain if his symptoms are related to meals. He used to be on Dexilant. He is no longer on this medication. He denies exertional dyspnea. He denies orthopnea, PND or edema. He denies syncope. He does have bright red blood per rectum after having a hard bowel movement. Denies melena. He has lost about 30 pounds over the past 6 months. He attributes this to increased activity. He denies night sweats or fevers.      Past Medical History  Diagnosis Date  . Diabetes mellitus   . High cholesterol   . Hypertension   . Pancreatitis     a. mild by CT 06/2011  . Obesity   . Peptic ulcer disease     a. 06/2009 EGD: multiple gastric and duodenal ulcers.  Marland Kitchen CAD (coronary artery disease) 04/07/2012    Inferoposterior STEMI s/p DES-mid LCx  . Myocardial infarction [facility])     Past Surgical History  Procedure Laterality Date  . Esophagogastroduodenoscopy  06/18/2011    NL  ESOPHAGUS/UlcerATED LESIONS/ SUPERFICIAL Ulcers  . Coronary angioplasty with stent placement  04/07/2012    70% prox LAD, 70-80% ostial diagonal, 70% mid-distal LAD, 80% prox OM1, mid LCx totally occluded just distal to OM1 s/p DES, occluded RCA; severe inferior HK, LVEF 45%  . Left heart catheterization with coronary angiogram N/A 04/07/2012    Procedure: LEFT HEART CATHETERIZATION WITH CORONARY ANGIOGRAM;  Surgeon: Sherren Mocha, MD;  Location: [facility] CATH LAB;  Service: Cardiovascular;  Laterality: N/A;  . Percutaneous coronary stent intervention (pci-s)  04/07/2012    Procedure: PERCUTANEOUS CORONARY STENT INTERVENTION (PCI-S);  Surgeon: Sherren Mocha, MD;  Location: [facility] CATH LAB;  Service: Cardiovascular;;    Current Medications: Outpatient Prescriptions Prior to Visit  Medication Sig Dispense Refill  . aspirin EC 81 MG EC tablet Take 1 tablet (81 mg total) by mouth daily.    . cyclobenzaprine (FLEXERIL) 10 MG tablet Take 1 tablet (10 mg total) by mouth 3 (three) times daily as needed for muscle spasms. 90 tablet 1  . glipiZIDE (GLUCOTROL) 10 MG tablet TAKE ONE TABLET BY MOUTH TWICE DAILY BEFORE A MEAL 60 tablet 5  . atorvastatin (LIPITOR) 80 MG tablet TAKE ONE TABLET BY MOUTH ONCE DAILY AT 6PM 30 tablet 5  . Canagliflozin (INVOKANA) 300 MG TABS Take 1 tablet (300 mg total) by mouth every morning. 30 tablet  5  . clopidogrel (PLAVIX) 75 MG tablet TAKE ONE TABLET BY MOUTH ONCE DAILY 30 tablet 5  . dexlansoprazole (DEXILANT) 60 MG capsule Take 1 capsule (60 mg total) by mouth daily. 30 capsule 5  . gabapentin (NEURONTIN) 300 MG capsule TAKE ONE CAPSULE BY MOUTH THREE TIMES DAILY 90 capsule 5  . lisinopril (PRINIVIL,ZESTRIL) 5 MG tablet TAKE ONE TABLET BY MOUTH ONCE DAILY 30 tablet 11  . metFORMIN (GLUCOPHAGE) 1000 MG tablet TAKE ONE TABLET BY MOUTH TWICE DAILY 60 tablet 0  . metoprolol tartrate (LOPRESSOR) 25 MG tablet Take 1 tablet (25 mg total) by mouth 2 (two) times daily. 60 tablet 5  .  nitroGLYCERIN (NITROSTAT) 0.4 MG SL tablet Place 1 tablet (0.4 mg total) under the tongue every 5 (five) minutes x 3 doses as needed for chest pain. 25 tablet 3  . pantoprazole (PROTONIX) 40 MG tablet Take 1 tablet (40 mg total) by mouth 2 (two) times daily. 60 tablet 11  . sucralfate (CARAFATE) 1 G tablet TAKE ONE TABLET BY MOUTH 4 TIMES DAILY 120 tablet 0   No facility-administered medications prior to visit.     Allergies:   Review of patient's allergies indicates no known allergies.   Social History   Social History  . Marital Status: Married    Spouse Name: N/A  . Number of Children: N/A  . Years of Education: N/A   Social History Main Topics  . Smoking status: Former Research scientist (life sciences)  . Smokeless tobacco: Former Systems developer    Quit date: 01/01/1998  . Alcohol Use: No  . Drug Use: No  . Sexual Activity: Yes   Other Topics Concern  . None   Social History Narrative   Lives in [place], with his wife and 43 yr old son.     Family History:  The patient's family history includes Heart attack in his father.   ROS:   Please see the history of present illness.    Review of Systems  Respiratory: Positive for snoring.   Musculoskeletal: Positive for back pain, joint pain and myalgias.  Gastrointestinal: Positive for constipation and hematochezia.  All other systems reviewed and are negative.   Physical Exam:    VS:  BP 116/80 mmHg  Pulse 68  Ht 5\' 8"  (1.727 m)  Wt 248 lb 12.8 oz (112.855 kg)  BMI 37.84 kg/m2   GEN: Well nourished, well developed, in no acute distress HEENT: normal Neck: no JVD, no masses Cardiac: Normal S1/S2, RRR; no murmurs,  no edema   Respiratory:  clear to auscultation bilaterally; no wheezing, rhonchi or rales GI: soft, nontender  MS: no deformity or atrophy Skin: warm and dry, no rash Neuro:  no focal deficits  Psych: Alert and oriented x 3, normal affect  Wt Readings from Last 3 Encounters:  02/07/15 248 lb 12.8 oz (112.855 kg)  08/06/13 274 lb  (124.286 kg)  07/02/13 274 lb (124.286 kg)      Studies/Labs Reviewed:     EKG:  EKG is  ordered today.  The ekg ordered today demonstrates NSR, HR 69, LAD, inferior Q waves, QTc 420 ms, no change from prior tracings.   Recent Labs: No results found for requested labs within last 365 days.   Recent Lipid Panel    Component Value Date/Time   CHOL 135 07/14/2013 0844   TRIG 163* 07/14/2013 0844   HDL 38* 07/14/2013 0844   CHOLHDL 3.6 07/14/2013 0844   VLDL 33 07/14/2013 0844   LDLCALC 64 07/14/2013 0844  Additional studies/ records that were reviewed today include:   LHC (4/14):  pLAD 70%, oDx 70-80%, mid to dist LAD 70%, pOM1 80%, mCFX occluded, RCA with CTO, inf HK, EF 45%. PCI: Promus DES to [facility]. Residual disease treated medically.   Echo (11/2012):  Mild LVH, EF 40-45%, inf and post AK, Gr 1 DD, mild LAE  Myoview (4/14) done b/c of residual CAD:  intermediate risk, inf-lat scar, no ischemia, EF 39%. Med Rx continued.    ASSESSMENT:     1. Coronary artery disease involving native coronary artery of native heart without angina pectoris   2. Ischemic cardiomyopathy   3. Essential hypertension   4. Hyperlipidemia   5. Type 2 diabetes mellitus with other circulatory complication (HCC)   6. Weight loss     PLAN:     In order of problems listed above:  1. CAD -  s/p inf-post STEMI tx with DES to CFX in 04/2012.  He has been off of medications for the past 2 months. He lost his insurance and cannot afford his medications. I could not find any samples that would be adequate substitutes for his medications. I reviewed the [facility] and [facility] $4 lists. I have tried to adjust his medications to make a more affordable.  I have also recommended he look into a website called [facility] to purchase his Plavix (Clopidogrel can be purchased for $10.95 per month).  -  Continue ASA, Plavix.  -  Resume Lisinopril 2.5 mg QD, Metoprolol tartrate 12.5 mg bid  -  Change Lipitor to  Pravastatin 40 mg QHS  -  FU with me or Dr. Sherren Mocha in 2-3 weeks.   2. Ischemic cardiomyopathy: Continue beta blocker, ACEI. EF stable by echo in 11/2012.  3. Essential hypertension: Controlled.    4. Hyperlipidemia: As noted, Lipitor changed to Pravastatin (on $4 list at both [facility] and [facility]).    5. Diabetes - Will try to get him into [facility] or [facility] to help with FU and getting affordable medications. I will refill his Metformin for him to help with his Diabetes.    6. Chest pain - CP is atypical for ischemia. ECG is unchanged. Suspect symptoms are related to GERD.    -  Start Pepcid 20 mg bid ($4 list)  -  Resume cardiac meds  -  If symptoms persist, consider FU stress testing  7. Weight loss - Suspect this is related to increased activity.  As noted, will try to get him into PCP.  Check BMET, CBC, LFTs.    Medication Adjustments/Labs and Tests Ordered: Current medicines are reviewed at length with the patient today.  Concerns regarding medicines are outlined above.  Medication changes, Labs and Tests ordered today are outlined in the Patient Instructions noted below. Patient Instructions  Medication Instructions:  1. STOP LIPITOR  2. START PRAVASTATIN 40 MG AT BEDTIME 3. START FAMOTIDINE 20 MG 1 TABLET TWICE DAILY 4. DECREASE LISINOPRIL TO 2.5 MG DAILY; NEW RX 5. DECREASE METOPROLOL TARTRATE TO 12.5 MG TWICE DAILY; (THIS WILL BE 1/2 TAB IN THE MORNING AND 1/2 TAB IN THE PM) 6. A REFILL FOR NITROGLYCERIN HAS BEEN SENT IN 7. A REFILL FOR METFORMIN HAS BEEN SENT IN  Labwork: TODAY BMET, CBC W/DIFF, LFT  Testing/Procedures: NONE  Follow-Up: SCOTT WEAVER, PAC 2-3 WEEKS SAME DAY DR. Burt Knack IS IN THE OFFICE  Any Other Special Instructions Will Be Listed Below (If Applicable). WE HAVE PLACED A COUPLE  OF REFERRAL'S IN THE SYSTEM FOR YOU TO EST WITH A PRIMARY CARE; A REFERRAL TO [facility] AS  WELL AS A REFERRAL FOR [facility] DX DIABETES, WEIGHT LOSS  YOU HAVE BEEN PROVIDED WITH A WEB SITE TO HELP WITH THE COST OF THE PLAVIX; THE WEB SITE IS : [email or web address]; PER SCOTT WEAVER, PAC YOU WILL NEED TO MAKE SURE THAT YOU ENTER THE GENERIC NAME FOR PLAVIX WHICH IS CLOPIDOGREL  If you need a refill on your cardiac medications before your next appointment, please call your pharmacy.      Signed, Richardson Dopp, PA-C  02/07/2015 11:16 AM    [facility] [address], [place], [place]  [address] Phone: [phone]; Fax: [phone]

## 2015-02-07 ENCOUNTER — Encounter: Payer: Self-pay | Admitting: Physician Assistant

## 2015-02-07 ENCOUNTER — Ambulatory Visit (INDEPENDENT_AMBULATORY_CARE_PROVIDER_SITE_OTHER): Payer: Self-pay | Admitting: Physician Assistant

## 2015-02-07 VITALS — BP 116/80 | HR 68 | Ht 68.0 in | Wt 248.8 lb

## 2015-02-07 DIAGNOSIS — R634 Abnormal weight loss: Secondary | ICD-10-CM

## 2015-02-07 DIAGNOSIS — E1159 Type 2 diabetes mellitus with other circulatory complications: Secondary | ICD-10-CM

## 2015-02-07 DIAGNOSIS — I2510 Atherosclerotic heart disease of native coronary artery without angina pectoris: Secondary | ICD-10-CM

## 2015-02-07 DIAGNOSIS — E785 Hyperlipidemia, unspecified: Secondary | ICD-10-CM

## 2015-02-07 DIAGNOSIS — I255 Ischemic cardiomyopathy: Secondary | ICD-10-CM

## 2015-02-07 DIAGNOSIS — I10 Essential (primary) hypertension: Secondary | ICD-10-CM

## 2015-02-07 LAB — BASIC METABOLIC PANEL
BUN: 15 mg/dL (ref 7–25)
CHLORIDE: 100 mmol/L (ref 98–110)
CO2: 24 mmol/L (ref 20–31)
CREATININE: 0.9 mg/dL (ref 0.70–1.33)
Calcium: 9.1 mg/dL (ref 8.6–10.3)
Glucose, Bld: 291 mg/dL — ABNORMAL HIGH (ref 65–99)
Potassium: 4.1 mmol/L (ref 3.5–5.3)
SODIUM: 135 mmol/L (ref 135–146)

## 2015-02-07 LAB — CBC WITH DIFFERENTIAL/PLATELET
BASOS ABS: 0.1 10*3/uL (ref 0.0–0.1)
BASOS PCT: 1 % (ref 0–1)
EOS ABS: 0.2 10*3/uL (ref 0.0–0.7)
Eosinophils Relative: 3 % (ref 0–5)
HCT: 49.4 % (ref 39.0–52.0)
Hemoglobin: 17.2 g/dL — ABNORMAL HIGH (ref 13.0–17.0)
LYMPHS ABS: 3.3 10*3/uL (ref 0.7–4.0)
Lymphocytes Relative: 40 % (ref 12–46)
MCH: 32.9 pg (ref 26.0–34.0)
MCHC: 34.8 g/dL (ref 30.0–36.0)
MCV: 94.5 fL (ref 78.0–100.0)
MPV: 10.7 fL (ref 8.6–12.4)
Monocytes Absolute: 1.1 10*3/uL — ABNORMAL HIGH (ref 0.1–1.0)
Monocytes Relative: 13 % — ABNORMAL HIGH (ref 3–12)
NEUTROS ABS: 3.6 10*3/uL (ref 1.7–7.7)
NEUTROS PCT: 43 % (ref 43–77)
PLATELETS: 234 10*3/uL (ref 150–400)
RBC: 5.23 MIL/uL (ref 4.22–5.81)
RDW: 12.8 % (ref 11.5–15.5)
WBC: 8.3 10*3/uL (ref 4.0–10.5)

## 2015-02-07 LAB — HEPATIC FUNCTION PANEL
ALK PHOS: 101 U/L (ref 40–115)
ALT: 30 U/L (ref 9–46)
AST: 23 U/L (ref 10–35)
Albumin: 4.1 g/dL (ref 3.6–5.1)
BILIRUBIN INDIRECT: 0.7 mg/dL (ref 0.2–1.2)
BILIRUBIN TOTAL: 0.8 mg/dL (ref 0.2–1.2)
Bilirubin, Direct: 0.1 mg/dL (ref <=0.2)
Total Protein: 7.2 g/dL (ref 6.1–8.1)

## 2015-02-07 MED ORDER — NITROGLYCERIN 0.4 MG SL SUBL: 0.4000 mg | SUBLINGUAL_TABLET | SUBLINGUAL | Status: DC | PRN

## 2015-02-07 MED ORDER — METFORMIN HCL 1000 MG PO TABS: 1000.0000 mg | ORAL_TABLET | Freq: Two times a day (BID) | ORAL | Status: DC

## 2015-02-07 MED ORDER — FAMOTIDINE 20 MG PO TABS: 20.0000 mg | ORAL_TABLET | Freq: Two times a day (BID) | ORAL | Status: DC

## 2015-02-07 MED ORDER — CLOPIDOGREL BISULFATE 75 MG PO TABS: ORAL_TABLET | ORAL | Status: DC

## 2015-02-07 MED ORDER — METOPROLOL TARTRATE 25 MG PO TABS: 12.5000 mg | ORAL_TABLET | Freq: Two times a day (BID) | ORAL | Status: DC

## 2015-02-07 MED ORDER — LISINOPRIL 2.5 MG PO TABS: 2.5000 mg | ORAL_TABLET | Freq: Every day | ORAL | Status: DC

## 2015-02-07 MED ORDER — PRAVASTATIN SODIUM 40 MG PO TABS: 40.0000 mg | ORAL_TABLET | Freq: Every evening | ORAL | Status: DC

## 2015-02-07 NOTE — Patient Instructions (Addendum)
Medication Instructions:  1. STOP LIPITOR  2. START PRAVASTATIN 40 MG AT BEDTIME 3. START FAMOTIDINE 20 MG 1 TABLET TWICE DAILY 4. DECREASE LISINOPRIL TO 2.5 MG DAILY; NEW RX 5. DECREASE METOPROLOL TARTRATE TO 12.5 MG TWICE DAILY; (THIS WILL BE 1/2 TAB IN THE MORNING AND 1/2 TAB IN THE PM) 6. A REFILL FOR NITROGLYCERIN HAS BEEN SENT IN 7. A REFILL FOR METFORMIN HAS BEEN SENT IN  Labwork: TODAY BMET, CBC W/DIFF, LFT  Testing/Procedures: NONE  Follow-Up: SCOTT WEAVER, PAC 2-3 WEEKS SAME DAY DR. Burt Knack IS IN THE OFFICE  Any Other Special Instructions Will Be Listed Below (If Applicable). WE HAVE PLACED A COUPLE OF REFERRAL'S IN THE SYSTEM FOR YOU TO EST WITH A PRIMARY CARE; A REFERRAL TO [facility] AS WELL AS A REFERRAL FOR [facility] DX DIABETES, WEIGHT LOSS  YOU HAVE BEEN PROVIDED WITH A WEB SITE TO HELP WITH THE COST OF THE PLAVIX; THE WEB SITE IS : [email or web address]; PER SCOTT WEAVER, PAC YOU WILL NEED TO MAKE SURE THAT YOU ENTER THE GENERIC NAME FOR PLAVIX WHICH IS CLOPIDOGREL  If you need a refill on your cardiac medications before your next appointment, please call your pharmacy.

## 2015-02-07 NOTE — Addendum Note (Signed)
Addended by: Michae Kava on: 02/07/2015 12:54 PM   Modules accepted: Orders

## 2015-02-22 NOTE — Progress Notes (Signed)
Cardiology Office Note:    Date:  02/22/2015   ID:  Juan Ray, DOB 12/07/1960, MRN [id number]  PCP:  Odette Fraction, MD  Cardiologist:  Dr. Sherren Mocha   Electrophysiologist:  n/a  Chief Complaint  Patient presents with  . Coronary Artery Disease    Follow-up    History of Present Illness:     Juan Ray is a 55 y.o. male with a hx of CAD, DM2, HTN, HL, PUD. He suffered an inf-post STEMI 04/2012 treated with Promus DES to [facility]. Residual disease treated medically. Follow up myoview demonstrated inf-lat scar, no ischemia, EF 39%. Med Rx continued.   I saw him 02/07/15 for follow-up. He had lost his insurance. He had been out of most of his medications for months. He complained of chest discomfort. I tried to find cheaper alternatives for his medication so they could be resumed. He returns for close follow-up.     Past Medical History  Diagnosis Date  . Diabetes mellitus   . High cholesterol   . Hypertension   . Pancreatitis     a. mild by CT 06/2011  . Obesity   . Peptic ulcer disease     a. 06/2009 EGD: multiple gastric and duodenal ulcers.  Marland Kitchen CAD (coronary artery disease) 04/07/2012    Inferoposterior STEMI s/p DES-mid LCx  . Myocardial infarction [facility])     Past Surgical History  Procedure Laterality Date  . Esophagogastroduodenoscopy  06/18/2011    NL ESOPHAGUS/UlcerATED LESIONS/ SUPERFICIAL Ulcers  . Coronary angioplasty with stent placement  04/07/2012    70% prox LAD, 70-80% ostial diagonal, 70% mid-distal LAD, 80% prox OM1, mid LCx totally occluded just distal to OM1 s/p DES, occluded RCA; severe inferior HK, LVEF 45%  . Left heart catheterization with coronary angiogram N/A 04/07/2012    Procedure: LEFT HEART CATHETERIZATION WITH CORONARY ANGIOGRAM;  Surgeon: Sherren Mocha, MD;  Location: [facility] CATH LAB;  Service: Cardiovascular;  Laterality: N/A;  . Percutaneous coronary stent intervention (pci-s)  04/07/2012    Procedure: PERCUTANEOUS CORONARY STENT  INTERVENTION (PCI-S);  Surgeon: Sherren Mocha, MD;  Location: [facility] CATH LAB;  Service: Cardiovascular;;    Current Medications: Outpatient Prescriptions Prior to Visit  Medication Sig Dispense Refill  . aspirin EC 81 MG EC tablet Take 1 tablet (81 mg total) by mouth daily.    . clopidogrel (PLAVIX) 75 MG tablet TAKE ONE TABLET BY MOUTH ONCE DAILY 30 tablet 11  . cyclobenzaprine (FLEXERIL) 10 MG tablet Take 1 tablet (10 mg total) by mouth 3 (three) times daily as needed for muscle spasms. 90 tablet 1  . famotidine (PEPCID) 20 MG tablet Take 1 tablet (20 mg total) by mouth 2 (two) times daily. 180 tablet 3  . glipiZIDE (GLUCOTROL) 10 MG tablet TAKE ONE TABLET BY MOUTH TWICE DAILY BEFORE A MEAL 60 tablet 5  . lisinopril (PRINIVIL,ZESTRIL) 2.5 MG tablet Take 1 tablet (2.5 mg total) by mouth daily. 90 tablet 3  . metFORMIN (GLUCOPHAGE) 1000 MG tablet Take 1 tablet (1,000 mg total) by mouth 2 (two) times daily. 60 tablet 2  . metoprolol tartrate (LOPRESSOR) 25 MG tablet Take 0.5 tablets (12.5 mg total) by mouth 2 (two) times daily. 90 tablet 3  . nitroGLYCERIN (NITROSTAT) 0.4 MG SL tablet Place 1 tablet (0.4 mg total) under the tongue every 5 (five) minutes x 3 doses as needed for chest pain. 25 tablet 3  . pravastatin (PRAVACHOL) 40 MG tablet Take 1 tablet (40 mg total) by mouth every evening.  90 tablet 3   No facility-administered medications prior to visit.     Allergies:   Review of patient's allergies indicates no known allergies.   Social History   Social History  . Marital Status: Married    Spouse Name: N/A  . Number of Children: N/A  . Years of Education: N/A   Social History Main Topics  . Smoking status: Former Research scientist (life sciences)  . Smokeless tobacco: Former Systems developer    Quit date: 01/01/1998  . Alcohol Use: No  . Drug Use: No  . Sexual Activity: Yes   Other Topics Concern  . Not on file   Social History Narrative   Lives in [place], with his wife and 41 yr old son.     Family  History:  The patient's family history includes Heart attack in his father.   ROS:   Please see the history of present illness.    ROSAll other systems reviewed and are negative.   Physical Exam:    VS:  There were no vitals taken for this visit.   GEN: Well nourished, well developed, in no acute distress HEENT: normal Neck: no JVD, no masses Cardiac: Normal S1/S2, RRR; no murmurs,  no edema   Respiratory:  clear to auscultation bilaterally; no wheezing, rhonchi or rales GI: soft, nontender  MS: no deformity or atrophy Skin: warm and dry, no rash Neuro:  no focal deficits  Psych: Alert and oriented x 3, normal affect  Wt Readings from Last 3 Encounters:  02/07/15 248 lb 12.8 oz (112.855 kg)  08/06/13 274 lb (124.286 kg)  07/02/13 274 lb (124.286 kg)      Studies/Labs Reviewed:     EKG:  EKG is  ordered today.  The ekg ordered today demonstrates   Recent Labs: 02/07/2015: ALT 30; BUN 15; Creat 0.90; Hemoglobin 17.2*; Platelets 234; Potassium 4.1; Sodium 135   Recent Lipid Panel    Component Value Date/Time   CHOL 135 07/14/2013 0844   TRIG 163* 07/14/2013 0844   HDL 38* 07/14/2013 0844   CHOLHDL 3.6 07/14/2013 0844   VLDL 33 07/14/2013 0844   LDLCALC 64 07/14/2013 0844    Additional studies/ records that were reviewed today include:   LHC (4/14):  pLAD 70%, oDx 70-80%, mid to dist LAD 70%, pOM1 80%, mCFX occluded, RCA with CTO, inf HK, EF 45%. PCI: Promus DES to [place]. Residual disease treated medically.   Echo (11/2012):  Mild LVH, EF 40-45%, inf and post AK, Gr 1 DD, mild LAE  Myoview (4/14) done b/c of residual CAD:  intermediate risk, inf-lat scar, no ischemia, EF 39%. Med Rx continued.    ASSESSMENT:     1. Coronary artery disease involving native coronary artery of native heart without angina pectoris   2. Ischemic cardiomyopathy   3. Essential hypertension   4. Hyperlipidemia     PLAN:     In order of problems listed above:  1. CAD -  s/p  inf-post STEMI tx with DES to CFX in 04/2012.  I saw him recently and he had been off of medications for the past few months. He had lost his insurance and could not afford his medications.   [email or web address] to purchase his Plavix (Clopidogrel can be purchased for $10.95 per month).  -  Continue ASA, Plavix.  -  Resume Lisinopril 2.5 mg QD, Metoprolol tartrate 12.5 mg bid  -  Change Lipitor to Pravastatin 40 mg QHS  -  FU with me or Dr. Sherren Mocha in  2-3 weeks.   2. Ischemic cardiomyopathy: Continue beta blocker, ACEI. EF stable by echo in 11/2012.  3. Essential hypertension: Controlled.    4. Hyperlipidemia: As noted, Lipitor changed to Pravastatin.  Get Lipids and LFTs 3 mos.    5. Diabetes -  [facility] or [facility]  Metformin for him to help with his Diabetes.    6. Chest pain - CP is atypical for ischemia. ECG is unchanged. Suspect symptoms are related to GERD.    -  Start Pepcid 20 mg bid ($4 list)  -  Resume cardiac meds  -  If symptoms persist, consider FU stress testing  7. Weight loss - Suspect this is related to increased activity.  As noted, will try to get him into PCP.  Check BMET, CBC, LFTs.    Medication Adjustments/Labs and Tests Ordered: Current medicines are reviewed at length with the patient today.  Concerns regarding medicines are outlined above.  Medication changes, Labs and Tests ordered today are outlined in the Patient Instructions noted below. There are no Patient Instructions on file for this visit.  Signed, Richardson Dopp, PA-C  02/22/2015 2:29 PM    [facility] [address], [place], [place]  [address] Phone: [phone]; Fax: [phone]     This encounter was created in error - please disregard.

## 2015-02-23 ENCOUNTER — Encounter: Payer: Self-pay | Admitting: Physician Assistant

## 2015-03-09 ENCOUNTER — Ambulatory Visit: Payer: Self-pay | Admitting: Physician Assistant

## 2015-07-28 ENCOUNTER — Encounter: Payer: Self-pay | Admitting: Family Medicine

## 2015-07-28 ENCOUNTER — Ambulatory Visit (INDEPENDENT_AMBULATORY_CARE_PROVIDER_SITE_OTHER): Payer: Self-pay | Admitting: Family Medicine

## 2015-07-28 ENCOUNTER — Emergency Department (HOSPITAL_COMMUNITY): Payer: Self-pay

## 2015-07-28 ENCOUNTER — Encounter (HOSPITAL_COMMUNITY): Payer: Self-pay | Admitting: Emergency Medicine

## 2015-07-28 ENCOUNTER — Emergency Department (HOSPITAL_COMMUNITY)
Admission: EM | Admit: 2015-07-28 | Discharge: 2015-07-28 | Disposition: A | Discharge status: Home / Self Care | Payer: Self-pay | Attending: Emergency Medicine | Admitting: Emergency Medicine

## 2015-07-28 VITALS — BP 108/74 | HR 94 | Resp 18

## 2015-07-28 DIAGNOSIS — R109 Unspecified abdominal pain: Secondary | ICD-10-CM

## 2015-07-28 DIAGNOSIS — Z955 Presence of coronary angioplasty implant and graft: Secondary | ICD-10-CM | POA: Insufficient documentation

## 2015-07-28 DIAGNOSIS — Z79899 Other long term (current) drug therapy: Secondary | ICD-10-CM | POA: Insufficient documentation

## 2015-07-28 DIAGNOSIS — Z7982 Long term (current) use of aspirin: Secondary | ICD-10-CM | POA: Insufficient documentation

## 2015-07-28 DIAGNOSIS — Z87891 Personal history of nicotine dependence: Secondary | ICD-10-CM | POA: Insufficient documentation

## 2015-07-28 DIAGNOSIS — I10 Essential (primary) hypertension: Secondary | ICD-10-CM | POA: Insufficient documentation

## 2015-07-28 DIAGNOSIS — I252 Old myocardial infarction: Secondary | ICD-10-CM | POA: Insufficient documentation

## 2015-07-28 DIAGNOSIS — R079 Chest pain, unspecified: Secondary | ICD-10-CM

## 2015-07-28 DIAGNOSIS — Z7984 Long term (current) use of oral hypoglycemic drugs: Secondary | ICD-10-CM | POA: Insufficient documentation

## 2015-07-28 DIAGNOSIS — I2510 Atherosclerotic heart disease of native coronary artery without angina pectoris: Secondary | ICD-10-CM | POA: Insufficient documentation

## 2015-07-28 DIAGNOSIS — R1031 Right lower quadrant pain: Secondary | ICD-10-CM

## 2015-07-28 DIAGNOSIS — E119 Type 2 diabetes mellitus without complications: Secondary | ICD-10-CM | POA: Insufficient documentation

## 2015-07-28 DIAGNOSIS — R1032 Left lower quadrant pain: Secondary | ICD-10-CM | POA: Insufficient documentation

## 2015-07-28 LAB — CBC
HCT: 43.4 % (ref 39.0–52.0)
Hemoglobin: 15.8 g/dL (ref 13.0–17.0)
MCH: 32.8 pg (ref 26.0–34.0)
MCHC: 36.4 g/dL — AB (ref 30.0–36.0)
MCV: 90 fL (ref 78.0–100.0)
PLATELETS: 232 10*3/uL (ref 150–400)
RBC: 4.82 MIL/uL (ref 4.22–5.81)
RDW: 12.8 % (ref 11.5–15.5)
WBC: 13.6 10*3/uL — AB (ref 4.0–10.5)

## 2015-07-28 LAB — BASIC METABOLIC PANEL
Anion gap: 8 (ref 5–15)
BUN: 11 mg/dL (ref 6–20)
CALCIUM: 9.4 mg/dL (ref 8.9–10.3)
CO2: 26 mmol/L (ref 22–32)
CREATININE: 1 mg/dL (ref 0.61–1.24)
Chloride: 100 mmol/L — ABNORMAL LOW (ref 101–111)
GFR calc Af Amer: >60 mL/min (ref >60)
GLUCOSE: 339 mg/dL — AB (ref 65–99)
POTASSIUM: 4.1 mmol/L (ref 3.5–5.1)
SODIUM: 134 mmol/L — AB (ref 135–145)

## 2015-07-28 LAB — I-STAT TROPONIN, ED: TROPONIN I, POC: 0 ng/mL (ref 0.00–0.08)

## 2015-07-28 MED ORDER — PREDNISONE 10 MG (21) PO TBPK: 10.0000 mg | ORAL_TABLET | Freq: Every day | ORAL | 0 refills | Status: DC

## 2015-07-28 NOTE — ED Triage Notes (Signed)
Pt presents with chest pain; onset last night, with abdominal pain and diarrhea today; hx MI & blockages, no CP at this time. 324mg  ASA given by EMS. EKG unremarkable.

## 2015-07-28 NOTE — ED Provider Notes (Signed)
[facility] DEPT Provider Note   CSN: [id number] Arrival date & time: 07/28/15  1437  First Provider Contact:  None       History   Chief Complaint Chief Complaint  Patient presents with  . Chest Pain    HPI Juan Ray is a 55 y.o. male.   Chest Pain     Patient presents to the emergency department with complaints of severe lower abdominal pain which began while at lunch.  He reports this was followed by 2 bowel movements and an episode of diarrhea.  He had nausea without vomiting.  He reports some mild low back pain but mainly pain coming across his abdomen diffusely.  His pain is improving now but still present mainly on the left lower quadrant.  He reports they did have some chest discomfort last night but reports no chest pain today.  No chest pain this time.  He does have a history of known coronary artery disease.   Past Medical History:  Diagnosis Date  . CAD (coronary artery disease) 04/07/2012   Inferoposterior STEMI s/p DES-mid LCx  . Diabetes mellitus   . High cholesterol   . Hypertension   . Myocardial infarction ([place])   . Obesity   . Pancreatitis    a. mild by CT 06/2011  . Peptic ulcer disease    a. 06/2009 EGD: multiple gastric and duodenal ulcers.    Patient Active Problem List   Diagnosis Date Noted  . Ischemic cardiomyopathy 07/02/2013  . Midsternal chest pain 07/11/2012  . ST elevation myocardial infarction (STEMI) of inferoposterior wall ([place]) 04/09/2012  . CAD (coronary artery disease), native coronary artery 04/09/2012  . PUD (peptic ulcer disease) 04/09/2012  . Epigastric pain 06/18/2011  . HTN (hypertension) 06/18/2011  . DM (diabetes mellitus) ([place]) 06/18/2011  . Hyperlipidemia 06/18/2011    Past Surgical History:  Procedure Laterality Date  . CORONARY ANGIOPLASTY WITH STENT PLACEMENT  04/07/2012   70% prox LAD, 70-80% ostial diagonal, 70% mid-distal LAD, 80% prox OM1, mid LCx totally occluded just distal to OM1 s/p DES, occluded  RCA; severe inferior HK, LVEF 45%  . ESOPHAGOGASTRODUODENOSCOPY  06/18/2011   NL ESOPHAGUS/UlcerATED LESIONS/ SUPERFICIAL Ulcers  . LEFT HEART CATHETERIZATION WITH CORONARY ANGIOGRAM N/A 04/07/2012   Procedure: LEFT HEART CATHETERIZATION WITH CORONARY ANGIOGRAM;  Surgeon: Sherren Mocha, MD;  Location: [facility] CATH LAB;  Service: Cardiovascular;  Laterality: N/A;  . PERCUTANEOUS CORONARY STENT INTERVENTION (PCI-S)  04/07/2012   Procedure: PERCUTANEOUS CORONARY STENT INTERVENTION (PCI-S);  Surgeon: Sherren Mocha, MD;  Location: [facility] CATH LAB;  Service: Cardiovascular;;       Home Medications    Prior to Admission medications   Medication Sig Start Date End Date Taking? Authorizing Provider  aspirin EC 81 MG EC tablet Take 1 tablet (81 mg total) by mouth daily. 04/09/12  Yes Roger A Arguello, PA-C  clopidogrel (PLAVIX) 75 MG tablet TAKE ONE TABLET BY MOUTH ONCE DAILY 02/07/15  Yes Liliane Shi, PA-C  cyclobenzaprine (FLEXERIL) 10 MG tablet Take 1 tablet (10 mg total) by mouth 3 (three) times daily as needed for muscle spasms. 05/09/12  Yes Susy Frizzle, MD  famotidine (PEPCID) 20 MG tablet Take 1 tablet (20 mg total) by mouth 2 (two) times daily. 02/07/15  Yes Scott T Kathlen Mody, PA-C  glipiZIDE (GLUCOTROL) 10 MG tablet TAKE ONE TABLET BY MOUTH TWICE DAILY BEFORE A MEAL 08/13/13  Yes Susy Frizzle, MD  lisinopril (PRINIVIL,ZESTRIL) 2.5 MG tablet Take 1 tablet (2.5 mg total) by mouth  daily. 02/07/15  Yes Liliane Shi, PA-C  metFORMIN (GLUCOPHAGE) 1000 MG tablet Take 1 tablet (1,000 mg total) by mouth 2 (two) times daily. 02/07/15  Yes Liliane Shi, PA-C  metoprolol tartrate (LOPRESSOR) 25 MG tablet Take 0.5 tablets (12.5 mg total) by mouth 2 (two) times daily. 02/07/15  Yes Liliane Shi, PA-C  nitroGLYCERIN (NITROSTAT) 0.4 MG SL tablet Place 1 tablet (0.4 mg total) under the tongue every 5 (five) minutes x 3 doses as needed for chest pain. 02/07/15  Yes Liliane Shi, PA-C  pravastatin (PRAVACHOL) 40 MG tablet  Take 1 tablet (40 mg total) by mouth every evening. 02/07/15  Yes Liliane Shi, PA-C    Family History Family History  Problem Relation Age of Onset  . Heart attack Father     MI x 2 in late 50's, currently 97's    Social History Social History  Substance Use Topics  . Smoking status: Former Research scientist (life sciences)  . Smokeless tobacco: Former Systems developer    Quit date: 01/01/1998  . Alcohol use No     Allergies   Review of patient's allergies indicates no known allergies.   Review of Systems Review of Systems  Cardiovascular: Positive for chest pain.  All other systems reviewed and are negative.    Physical Exam Updated Vital Signs BP 109/74   Pulse 81   Temp 98.1 F (36.7 C) (Oral)   Resp 12   Ht 5\' 8"  (1.727 m)   Wt 240 lb (108.9 kg)   SpO2 94%   BMI 36.49 kg/m   Physical Exam  Constitutional: He is oriented to person, place, and time. He appears well-developed and well-nourished.  HENT:  Head: Normocephalic and atraumatic.  Eyes: EOM are normal.  Neck: Normal range of motion.  Cardiovascular: Normal rate, regular rhythm, normal heart sounds and intact distal pulses.   Pulmonary/Chest: Effort normal and breath sounds normal. No respiratory distress.  Abdominal: Soft. He exhibits no distension.  Mild left lower quadrant abdominal tenderness without guarding or rebound  Musculoskeletal: Normal range of motion.  Neurological: He is alert and oriented to person, place, and time.  Skin: Skin is warm and dry.  Psychiatric: He has a normal mood and affect. Judgment normal.  Nursing note and vitals reviewed.    ED Treatments / Results  Labs (all labs ordered are listed, but only abnormal results are displayed) Labs Reviewed  BASIC METABOLIC PANEL - Abnormal; Notable for the following:       Result Value   Sodium 134 (*)    Chloride 100 (*)    Glucose, Bld 339 (*)    All other components within normal limits  CBC - Abnormal; Notable for the following:    WBC 13.6 (*)    MCHC  36.4 (*)    All other components within normal limits  I-STAT TROPOININ, ED    EKG  EKG Interpretation  Date/Time:  Thursday July 28 2015 14:40:14 EDT Ventricular Rate:  88 PR Interval:    QRS Duration: 110 QT Interval:  365 QTC Calculation: 442 R Axis:   -68 Text Interpretation:  Sinus rhythm Abnormal R-wave progression, late transition Inferior infarct, old Abnormal lateral Q waves Confirmed by [facility] MD, JASON [phone]) on 07/28/2015 5:17:07 PM       Radiology Dg Chest 2 View  Result Date: 07/28/2015 CLINICAL DATA:  Chest pain and shortness of breath for 1 day EXAM: CHEST  2 VIEW COMPARISON:  July 10, 2012 FINDINGS: There is no edema  or consolidation. Heart size and pulmonary vascularity are normal. No adenopathy. There is mild degenerative change in the thoracic spine. IMPRESSION: No edema or consolidation. Electronically Signed   By: Lowella Grip III M.D.   On: 07/28/2015 15:29  Ct Renal Stone Study  Result Date: 07/28/2015 CLINICAL DATA:  Left-sided abdominal pain EXAM: CT ABDOMEN AND PELVIS WITHOUT CONTRAST TECHNIQUE: Multidetector CT imaging of the abdomen and pelvis was performed following the standard protocol without IV contrast. COMPARISON:  06/19/2011 FINDINGS: Lower chest: Lung bases are free of acute infiltrate or sizable effusion. Coronary calcifications are noted. Hepatobiliary: Fatty infiltration of the liver without focal mass. The gallbladder is within normal limits. Pancreas: No mass or inflammatory process identified on this un-enhanced exam. Spleen: Within normal limits in size. Adrenals/Urinary Tract: Tiny nonobstructing left renal calculus. A small hypodensity is noted in the left mid kidney consistent with cysts seen on prior exam. No obstructive changes are noted. The bladder is partially distended. Stomach/Bowel: No evidence of obstruction, inflammatory process, or abnormal fluid collections. The appendix is within normal limits. Vascular/Lymphatic: Aortoiliac  calcifications are seen without aneurysmal dilatation. No significant lymphadenopathy is noted. Reproductive: No mass or other significant abnormality. Other: None. Musculoskeletal: Bilateral pars defects are noted at L5 without significant anterolisthesis. No acute bony abnormality is seen. IMPRESSION: Tiny nonobstructing left renal stone. No obstructive changes are noted. Chronic changes as described above. Electronically Signed   By: Inez Catalina M.D.   On: 07/28/2015 17:24   Procedures Procedures (including critical care time)  Medications Ordered in ED Medications - No data to display   Initial Impression / Assessment and Plan / ED Course  I have reviewed the triage vital signs and the nursing notes.  Pertinent labs & imaging results that were available during my care of the patient were reviewed by me and considered in my medical decision making (see chart for details).  Clinical Course    Patient feels better at this time.  CT scan without acute pathology.  Labs without abnormality.  EKG and troponin are normal.  No active chest pain at this time.  Doubt ACS.  Abdominal pain may been severe lower abdominal crampy pain from needed for bowel movement.  He does have a stone in his left kidney.  He could've passed a small left ureteral stone as well that has since resolved.  No dysuria or urinary frequency.  Final Clinical Impressions(s) / ED Diagnoses   Final diagnoses:  Abdominal pain, unspecified abdominal location    New Prescriptions New Prescriptions   No medications on file     Jola Schmidt, MD 07/28/15 1737

## 2015-07-28 NOTE — ED Notes (Signed)
Patient transported to X-ray 

## 2015-07-28 NOTE — ED Notes (Signed)
Pt returned from xray

## 2015-07-28 NOTE — Addendum Note (Signed)
Addended by: Sheral Flow on: 07/28/2015 02:30 PM   Modules accepted: Orders

## 2015-07-28 NOTE — Progress Notes (Signed)
Subjective:    Patient ID: Juan Ray, male    DOB: 1960/06/01, 55 y.o.   MRN: [id number]  HPI Patient is a 55 year old white male who is here today for followup of his diabetes mellitus type 2. His most recent lab work is listed below: No visits with results within 1 Month(s) from this visit.  Latest known visit with results is:  Office Visit on 02/07/2015  Component Date Value Ref Range Status  . Sodium 02/07/2015 135  135 - 146 mmol/L Final  . Potassium 02/07/2015 4.1  3.5 - 5.3 mmol/L Final  . Chloride 02/07/2015 100  98 - 110 mmol/L Final  . CO2 02/07/2015 24  20 - 31 mmol/L Final  . Glucose, Bld 02/07/2015 291* 65 - 99 mg/dL Final  . BUN 02/07/2015 15  7 - 25 mg/dL Final  . Creat 02/07/2015 0.90  0.70 - 1.33 mg/dL Final  . Calcium 02/07/2015 9.1  8.6 - 10.3 mg/dL Final  . WBC 02/07/2015 8.3  4.0 - 10.5 K/uL Final  . RBC 02/07/2015 5.23  4.22 - 5.81 MIL/uL Final  . Hemoglobin 02/07/2015 17.2* 13.0 - 17.0 g/dL Final  . HCT 02/07/2015 49.4  39.0 - 52.0 % Final  . MCV 02/07/2015 94.5  78.0 - 100.0 fL Final  . MCH 02/07/2015 32.9  26.0 - 34.0 pg Final  . MCHC 02/07/2015 34.8  30.0 - 36.0 g/dL Final  . RDW 02/07/2015 12.8  11.5 - 15.5 % Final  . Platelets 02/07/2015 234  150 - 400 K/uL Final  . MPV 02/07/2015 10.7  8.6 - 12.4 fL Final  . Neutrophils Relative % 02/07/2015 43  43 - 77 % Final  . Neutro Abs 02/07/2015 3.6  1.7 - 7.7 K/uL Final  . Lymphocytes Relative 02/07/2015 40  12 - 46 % Final  . Lymphs Abs 02/07/2015 3.3  0.7 - 4.0 K/uL Final  . Monocytes Relative 02/07/2015 13* 3 - 12 % Final  . Monocytes Absolute 02/07/2015 1.1* 0.1 - 1.0 K/uL Final  . Eosinophils Relative 02/07/2015 3  0 - 5 % Final  . Eosinophils Absolute 02/07/2015 0.2  0.0 - 0.7 K/uL Final  . Basophils Relative 02/07/2015 1  0 - 1 % Final  . Basophils Absolute 02/07/2015 0.1  0.0 - 0.1 K/uL Final  . Smear Review 02/07/2015 Criteria for review not met   Final  . Total Bilirubin 02/07/2015 0.8  0.2  - 1.2 mg/dL Final  . Bilirubin, Direct 02/07/2015 0.1  <=0.2 mg/dL Final  . Indirect Bilirubin 02/07/2015 0.7  0.2 - 1.2 mg/dL Final  . Alkaline Phosphatase 02/07/2015 101  40 - 115 U/L Final  . AST 02/07/2015 23  10 - 35 U/L Final  . ALT 02/07/2015 30  9 - 46 U/L Final  . Total Protein 02/07/2015 7.2  6.1 - 8.1 g/dL Final  . Albumin 02/07/2015 4.1  3.6 - 5.1 g/dL Final   He continues to remain overweight. He is not losing any weight. He is not following a low carbohydrate diet. He is not getting any regular aerobic exercise. He denies any chest pain shortness of breath or dyspnea on exertion. See above A1c is still significantly elevated. Fortunately his blood pressure his cholesterol excellent. Unfortunately the patient continues to have problems with acid reflux, indigestion, and epigastric abdominal discomfort. He is currently taking protonix and carafate but he continues to have breakthrough symptoms. He is also felt over the counter omeprazole. He can no longer take omeprazole due to the  fact he is on Plavix for his coronary artery disease. He has previously taken dexilant which worked exceptionally well.  At that time my plan was: 1. Type II or unspecified type diabetes mellitus without mention of complication, uncontrolled Continue current medications at their present dosages. I will also add Invokana 300 mg poqday.  I strongly encouraged the patient to perform therapeutic lifestyle changes including diet exercise and weight loss. Recheck hemoglobin A1c in 3 months along with a urine microalbumin. Currently blood pressure and cholesterol are excellent. - Canagliflozin (INVOKANA) 300 MG TABS; Take 1 tablet (300 mg total) by mouth every morning.  Dispense: 30 tablet; Refill: 5  2. Gastroesophageal reflux disease, esophagitis presence not specified Resume dexilant 60 mg poqday and dc protonix and carafate. - dexlansoprazole (DEXILANT) 60 MG capsule; Take 1 capsule (60 mg total) by mouth daily.   Dispense: 30 capsule; Refill: 5   07/28/15 Wife was found banging on the door the clinic at lunchtime yelling husbands have a heart attack. One outside of the truck. The patient was diaphoretic and extremely lightheaded sitting in the front seat St. he was having chest pain and trouble breathing felt like he was having a heart attack. Patient was brought into the clinic urgently and was placed on 2 L via nasal cannula and EKG was obtained. EKG reveals old Q waves essentially unchanged from his EKG from February 2017. There was no acute ST segment changes such as depression or elevation. As soon as the patient began to lay on the examination table, he developed intense right lower quadrant abdominal pain and urge to defecate. EMS had already been called by the clinic staff prior to evaluation. Pain improved immediately upon defecation with diarrhea..  Patient states that he is no longer having chest pain or shortness of breath Past Medical History:  Diagnosis Date  . CAD (coronary artery disease) 04/07/2012   Inferoposterior STEMI s/p DES-mid LCx  . Diabetes mellitus   . High cholesterol   . Hypertension   . Myocardial infarction ([place])   . Obesity   . Pancreatitis    a. mild by CT 06/2011  . Peptic ulcer disease    a. 06/2009 EGD: multiple gastric and duodenal ulcers.   Past Surgical History:  Procedure Laterality Date  . CORONARY ANGIOPLASTY WITH STENT PLACEMENT  04/07/2012   70% prox LAD, 70-80% ostial diagonal, 70% mid-distal LAD, 80% prox OM1, mid LCx totally occluded just distal to OM1 s/p DES, occluded RCA; severe inferior HK, LVEF 45%  . ESOPHAGOGASTRODUODENOSCOPY  06/18/2011   NL ESOPHAGUS/UlcerATED LESIONS/ SUPERFICIAL Ulcers  . LEFT HEART CATHETERIZATION WITH CORONARY ANGIOGRAM N/A 04/07/2012   Procedure: LEFT HEART CATHETERIZATION WITH CORONARY ANGIOGRAM;  Surgeon: Sherren Mocha, MD;  Location: [facility] CATH LAB;  Service: Cardiovascular;  Laterality: N/A;  . PERCUTANEOUS CORONARY STENT  INTERVENTION (PCI-S)  04/07/2012   Procedure: PERCUTANEOUS CORONARY STENT INTERVENTION (PCI-S);  Surgeon: Sherren Mocha, MD;  Location: [facility] CATH LAB;  Service: Cardiovascular;;   Current Outpatient Prescriptions on File Prior to Visit  Medication Sig Dispense Refill  . aspirin EC 81 MG EC tablet Take 1 tablet (81 mg total) by mouth daily.    . clopidogrel (PLAVIX) 75 MG tablet TAKE ONE TABLET BY MOUTH ONCE DAILY 30 tablet 11  . cyclobenzaprine (FLEXERIL) 10 MG tablet Take 1 tablet (10 mg total) by mouth 3 (three) times daily as needed for muscle spasms. 90 tablet 1  . famotidine (PEPCID) 20 MG tablet Take 1 tablet (20 mg total) by mouth  2 (two) times daily. 180 tablet 3  . glipiZIDE (GLUCOTROL) 10 MG tablet TAKE ONE TABLET BY MOUTH TWICE DAILY BEFORE A MEAL 60 tablet 5  . lisinopril (PRINIVIL,ZESTRIL) 2.5 MG tablet Take 1 tablet (2.5 mg total) by mouth daily. 90 tablet 3  . metFORMIN (GLUCOPHAGE) 1000 MG tablet Take 1 tablet (1,000 mg total) by mouth 2 (two) times daily. 60 tablet 2  . metoprolol tartrate (LOPRESSOR) 25 MG tablet Take 0.5 tablets (12.5 mg total) by mouth 2 (two) times daily. 90 tablet 3  . nitroGLYCERIN (NITROSTAT) 0.4 MG SL tablet Place 1 tablet (0.4 mg total) under the tongue every 5 (five) minutes x 3 doses as needed for chest pain. 25 tablet 3  . pravastatin (PRAVACHOL) 40 MG tablet Take 1 tablet (40 mg total) by mouth every evening. 90 tablet 3   No current facility-administered medications on file prior to visit.    No Known Allergies Social History   Social History  . Marital status: Married    Spouse name: N/A  . Number of children: N/A  . Years of education: N/A   Occupational History  . Not on file.   Social History Main Topics  . Smoking status: Former Research scientist (life sciences)  . Smokeless tobacco: Former Systems developer    Quit date: 01/01/1998  . Alcohol use No  . Drug use: No  . Sexual activity: Yes   Other Topics Concern  . Not on file   Social History Narrative   Lives in  [place], with his wife and 46 yr old son.     Review of Systems  Cardiovascular: Positive for chest pain.  All other systems reviewed and are negative.      Objective:   Physical Exam  Constitutional: He appears well-developed and well-nourished.  Neck: Neck supple. No JVD present. No thyromegaly present.  Cardiovascular: Normal rate, regular rhythm, normal heart sounds and intact distal pulses.  Exam reveals no gallop and no friction rub.   No murmur heard. Pulmonary/Chest: Effort normal and breath sounds normal. No respiratory distress. He has no wheezes. He has no rales.  Abdominal: Soft. Bowel sounds are normal. He exhibits no distension. There is no tenderness. There is no rebound and no guarding.  Musculoskeletal: He exhibits edema.  Lymphadenopathy:    He has no cervical adenopathy.  Vitals reviewed.         Assessment & Plan:  Chest pain Chest pain is very atypical. I do not think that this is a cardiac presentation. I believe the patient was having a vasovagal reaction to intense right lower quadrant abdominal pain which seemed to improve immediately upon defecation. However patient has a history of cardiovascular disease and states he had similar pain with his first heart attack and is taken to the emergency room for evaluation and initial set of troponins. EKG today is reassuring

## 2015-07-28 NOTE — Addendum Note (Signed)
Addended by: Sheral Flow on: 07/28/2015 02:42 PM   Modules accepted: Orders

## 2015-07-28 NOTE — ED Provider Notes (Signed)
Patient requests prednisone taper for contact dermatitis.  He's been dealing with a poison ivy rash on his arms and across his abdomen over the past week.  This is reasonable.  There is no signs of infection at this time.  The rash is consistent with a contact dermatitis.   Jola Schmidt, MD 07/28/15 [phone]

## 2015-07-28 NOTE — ED Notes (Signed)
Patient returned from CT

## 2016-01-27 ENCOUNTER — Encounter: Payer: Self-pay | Admitting: Family Medicine

## 2016-01-27 ENCOUNTER — Ambulatory Visit (INDEPENDENT_AMBULATORY_CARE_PROVIDER_SITE_OTHER): Payer: BLUE CROSS/BLUE SHIELD | Admitting: Family Medicine

## 2016-01-27 VITALS — BP 112/76 | HR 110 | Temp 97.7°F | Resp 18 | Ht 69.0 in | Wt 234.0 lb

## 2016-01-27 DIAGNOSIS — I2510 Atherosclerotic heart disease of native coronary artery without angina pectoris: Secondary | ICD-10-CM | POA: Diagnosis not present

## 2016-01-27 DIAGNOSIS — R509 Fever, unspecified: Secondary | ICD-10-CM | POA: Diagnosis not present

## 2016-01-27 DIAGNOSIS — E7800 Pure hypercholesterolemia, unspecified: Secondary | ICD-10-CM | POA: Diagnosis not present

## 2016-01-27 DIAGNOSIS — A09 Infectious gastroenteritis and colitis, unspecified: Secondary | ICD-10-CM

## 2016-01-27 DIAGNOSIS — I10 Essential (primary) hypertension: Secondary | ICD-10-CM | POA: Diagnosis not present

## 2016-01-27 DIAGNOSIS — E1100 Type 2 diabetes mellitus with hyperosmolarity without nonketotic hyperglycemic-hyperosmolar coma (NKHHC): Secondary | ICD-10-CM | POA: Diagnosis not present

## 2016-01-27 DIAGNOSIS — E860 Dehydration: Secondary | ICD-10-CM

## 2016-01-27 LAB — CBC WITH DIFFERENTIAL/PLATELET
BASOS PCT: 0 %
Basophils Absolute: 0 cells/uL (ref 0–200)
EOS ABS: 70 {cells}/uL (ref 15–500)
Eosinophils Relative: 1 %
HCT: 45.6 % (ref 38.5–50.0)
Hemoglobin: 15.8 g/dL (ref 13.0–17.0)
LYMPHS ABS: 1260 {cells}/uL (ref 850–3900)
Lymphocytes Relative: 18 %
MCH: 32.3 pg (ref 27.0–33.0)
MCHC: 34.6 g/dL (ref 32.0–36.0)
MCV: 93.3 fL (ref 80.0–100.0)
MONO ABS: 1400 {cells}/uL — AB (ref 200–950)
MONOS PCT: 20 %
MPV: 10 fL (ref 7.5–12.5)
Neutro Abs: 4270 cells/uL (ref 1500–7800)
Neutrophils Relative %: 61 %
PLATELETS: 189 10*3/uL (ref 140–400)
RBC: 4.89 MIL/uL (ref 4.20–5.80)
RDW: 13.6 % (ref 11.0–15.0)
WBC: 7 10*3/uL (ref 3.8–10.8)

## 2016-01-27 LAB — INFLUENZA A AND B AG, IMMUNOASSAY
INFLUENZA A ANTIGEN: NOT DETECTED
Influenza B Antigen: NOT DETECTED

## 2016-01-27 MED ORDER — PROMETHAZINE HCL 12.5 MG PO TABS: 12.5000 mg | ORAL_TABLET | Freq: Four times a day (QID) | ORAL | 0 refills | Status: DC | PRN

## 2016-01-27 MED ORDER — DIPHENOXYLATE-ATROPINE 2.5-0.025 MG PO TABS: 2.0000 | ORAL_TABLET | Freq: Four times a day (QID) | ORAL | 0 refills | Status: DC | PRN

## 2016-01-27 NOTE — Progress Notes (Signed)
Subjective:    Patient ID: Juan Ray, male    DOB: 28-Jan-1960, 56 y.o.   MRN: [id number]  HPI Patient is a very pleasant 56 year old white male here today to discuss his medical problems. Although very nice he is extremely noncompliant and fails to follow-up as scheduled. I have seen the patient once since 2015 in the house for chest pain when he was referred to the emergency room. His past medical history as listed below.  He is here mainly today because he feels bad. He reports severe diarrhea. He has gone to the restroom 9 times this morning. On examination he is hypertensive and tachycardic. He is not taking any of his diabetic medication for the last 2 days because he has not been able to eat. On exam he is clammy and lightheaded. He denies any abdominal pain. He denies any vomiting. He does report diffuse body aches and severe fatigue Past Medical History:  Diagnosis Date  . CAD (coronary artery disease) 04/07/2012   Inferoposterior STEMI s/p DES-mid LCx  . Diabetes mellitus   . High cholesterol   . Hypertension   . Myocardial infarction   . Obesity   . Pancreatitis    a. mild by CT 06/2011  . Peptic ulcer disease    a. 06/2009 EGD: multiple gastric and duodenal ulcers.   Past Surgical History:  Procedure Laterality Date  . CORONARY ANGIOPLASTY WITH STENT PLACEMENT  04/07/2012   70% prox LAD, 70-80% ostial diagonal, 70% mid-distal LAD, 80% prox OM1, mid LCx totally occluded just distal to OM1 s/p DES, occluded RCA; severe inferior HK, LVEF 45%  . ESOPHAGOGASTRODUODENOSCOPY  06/18/2011   NL ESOPHAGUS/UlcerATED LESIONS/ SUPERFICIAL Ulcers  . LEFT HEART CATHETERIZATION WITH CORONARY ANGIOGRAM N/A 04/07/2012   Procedure: LEFT HEART CATHETERIZATION WITH CORONARY ANGIOGRAM;  Surgeon: Sherren Mocha, MD;  Location: [facility] CATH LAB;  Service: Cardiovascular;  Laterality: N/A;  . PERCUTANEOUS CORONARY STENT INTERVENTION (PCI-S)  04/07/2012   Procedure: PERCUTANEOUS CORONARY STENT INTERVENTION  (PCI-S);  Surgeon: Sherren Mocha, MD;  Location: [facility] CATH LAB;  Service: Cardiovascular;;   Current Outpatient Prescriptions on File Prior to Visit  Medication Sig Dispense Refill  . aspirin EC 81 MG EC tablet Take 1 tablet (81 mg total) by mouth daily.    . clopidogrel (PLAVIX) 75 MG tablet TAKE ONE TABLET BY MOUTH ONCE DAILY 30 tablet 11  . famotidine (PEPCID) 20 MG tablet Take 1 tablet (20 mg total) by mouth 2 (two) times daily. 180 tablet 3  . lisinopril (PRINIVIL,ZESTRIL) 2.5 MG tablet Take 1 tablet (2.5 mg total) by mouth daily. 90 tablet 3  . metFORMIN (GLUCOPHAGE) 1000 MG tablet Take 1 tablet (1,000 mg total) by mouth 2 (two) times daily. 60 tablet 2  . metoprolol tartrate (LOPRESSOR) 25 MG tablet Take 0.5 tablets (12.5 mg total) by mouth 2 (two) times daily. 90 tablet 3  . nitroGLYCERIN (NITROSTAT) 0.4 MG SL tablet Place 1 tablet (0.4 mg total) under the tongue every 5 (five) minutes x 3 doses as needed for chest pain. 25 tablet 3  . pravastatin (PRAVACHOL) 40 MG tablet Take 1 tablet (40 mg total) by mouth every evening. 90 tablet 3  . glipiZIDE (GLUCOTROL) 10 MG tablet TAKE ONE TABLET BY MOUTH TWICE DAILY BEFORE A MEAL (Patient not taking: Reported on 01/27/2016) 60 tablet 5   No current facility-administered medications on file prior to visit.    No Known Allergies Social History   Social History  . Marital status: Married  Spouse name: N/A  . Number of children: N/A  . Years of education: N/A   Occupational History  . Not on file.   Social History Main Topics  . Smoking status: Former Research scientist (life sciences)  . Smokeless tobacco: Former Systems developer    Quit date: 01/01/1998  . Alcohol use No  . Drug use: No  . Sexual activity: Yes   Other Topics Concern  . Not on file   Social History Narrative   Lives in [place], with his wife and 48 yr old son.      Review of Systems  All other systems reviewed and are negative.      Objective:   Physical Exam  Constitutional: He appears  well-developed and well-nourished.  Eyes: Conjunctivae are normal. Pupils are equal, round, and reactive to light.  Neck: Neck supple. No JVD present. No thyromegaly present.  Cardiovascular: Regular rhythm, normal heart sounds and intact distal pulses.  Tachycardia present.   No murmur heard. Pulmonary/Chest: Effort normal and breath sounds normal. No respiratory distress. He has no wheezes. He has no rales.  Abdominal: Soft. Bowel sounds are normal. He exhibits no distension. There is no tenderness. There is no rebound and no guarding.  Musculoskeletal: He exhibits no edema.  Lymphadenopathy:    He has no cervical adenopathy.  Vitals reviewed.         Assessment & Plan:  Coronary artery disease involving native coronary artery of native heart without angina pectoris  Essential hypertension  Pure hypercholesterolemia  Uncontrolled type 2 diabetes mellitus with hyperosmolarity without coma, without long-term current use of insulin ([place]) - Plan: CBC with Differential/Platelet, COMPLETE METABOLIC PANEL WITH GFR, Microalbumin, urine, Hemoglobin A1c, Lipid panel  Dehydration  Diarrhea of infectious origin  I will screen the patient for influenza with a flu swab however I believe the patient likely has viral gastroenteritis secondary to the [place] virus.He is very dehydrated. Therefore I will give the patient a liter of normal saline. I recommended he temporarily discontinue his lisinopril and his metformin until he is feeling better. I will check a CBC, CMP, fasting lipid panel, hemoglobin A1c. Obviously his illness takes precedence. However I had a discussion with the patient that given his medical history he runs a high risk for morbidity and mortality due to uncontrolled diabetes. If his hemoglobin A1c is greater than 6.5, I would recommen jardiance given its evidence of reducing cardiovascular mortality.

## 2016-01-28 LAB — HEMOGLOBIN A1C
Hgb A1c MFr Bld: 10.2 % — ABNORMAL HIGH (ref <5.7)
MEAN PLASMA GLUCOSE: 246 mg/dL

## 2016-01-28 LAB — LIPID PANEL
CHOL/HDL RATIO: 5.6 ratio — AB (ref <5.0)
Cholesterol: 139 mg/dL (ref <200)
HDL: 25 mg/dL — ABNORMAL LOW (ref >40)
LDL Cholesterol: 78 mg/dL (ref <100)
Triglycerides: 182 mg/dL — ABNORMAL HIGH (ref <150)
VLDL: 36 mg/dL — AB (ref <30)

## 2016-01-28 LAB — COMPLETE METABOLIC PANEL WITH GFR
ALT: 30 U/L (ref 9–46)
AST: 27 U/L (ref 10–35)
Albumin: 3.3 g/dL — ABNORMAL LOW (ref 3.6–5.1)
Alkaline Phosphatase: 87 U/L (ref 40–115)
BILIRUBIN TOTAL: 1 mg/dL (ref 0.2–1.2)
BUN: 22 mg/dL (ref 7–25)
CO2: 21 mmol/L (ref 20–31)
CREATININE: 0.82 mg/dL (ref 0.70–1.33)
Calcium: 8.2 mg/dL — ABNORMAL LOW (ref 8.6–10.3)
Chloride: 99 mmol/L (ref 98–110)
GFR, Est Non African American: >89 mL/min (ref >=60)
GLUCOSE: 246 mg/dL — AB (ref 70–99)
Potassium: 3.9 mmol/L (ref 3.5–5.3)
Sodium: 134 mmol/L — ABNORMAL LOW (ref 135–146)
Total Protein: 5.9 g/dL — ABNORMAL LOW (ref 6.1–8.1)

## 2016-01-28 LAB — MICROALBUMIN, URINE: Microalb, Ur: 6.3 mg/dL

## 2016-02-02 ENCOUNTER — Other Ambulatory Visit: Payer: Self-pay | Admitting: Family Medicine

## 2016-02-02 DIAGNOSIS — I2510 Atherosclerotic heart disease of native coronary artery without angina pectoris: Secondary | ICD-10-CM

## 2016-02-02 DIAGNOSIS — I10 Essential (primary) hypertension: Secondary | ICD-10-CM

## 2016-02-02 MED ORDER — GLIPIZIDE 10 MG PO TABS: ORAL_TABLET | ORAL | 3 refills | Status: DC

## 2016-02-02 MED ORDER — LISINOPRIL 2.5 MG PO TABS: 2.5000 mg | ORAL_TABLET | Freq: Every day | ORAL | 3 refills | Status: DC

## 2016-02-02 MED ORDER — METFORMIN HCL 1000 MG PO TABS: 1000.0000 mg | ORAL_TABLET | Freq: Two times a day (BID) | ORAL | 3 refills | Status: DC

## 2016-02-02 MED ORDER — METOPROLOL TARTRATE 25 MG PO TABS: 12.5000 mg | ORAL_TABLET | Freq: Two times a day (BID) | ORAL | 3 refills | Status: DC

## 2016-02-02 MED ORDER — CLOPIDOGREL BISULFATE 75 MG PO TABS: ORAL_TABLET | ORAL | 3 refills | Status: DC

## 2016-02-02 MED ORDER — PRAVASTATIN SODIUM 40 MG PO TABS: 40.0000 mg | ORAL_TABLET | Freq: Every evening | ORAL | 3 refills | Status: DC

## 2016-02-07 ENCOUNTER — Encounter: Payer: Self-pay | Admitting: Family Medicine

## 2016-02-07 ENCOUNTER — Ambulatory Visit (INDEPENDENT_AMBULATORY_CARE_PROVIDER_SITE_OTHER): Payer: BLUE CROSS/BLUE SHIELD | Admitting: Family Medicine

## 2016-02-07 VITALS — BP 150/86 | HR 94 | Temp 98.2°F | Resp 18 | Ht 69.0 in | Wt 237.0 lb

## 2016-02-07 DIAGNOSIS — E1140 Type 2 diabetes mellitus with diabetic neuropathy, unspecified: Secondary | ICD-10-CM

## 2016-02-07 MED ORDER — GABAPENTIN 300 MG PO CAPS: 300.0000 mg | ORAL_CAPSULE | Freq: Three times a day (TID) | ORAL | 3 refills | Status: DC

## 2016-02-07 MED ORDER — PERMETHRIN 5 % EX CREA: 1.0000 "application " | TOPICAL_CREAM | Freq: Once | CUTANEOUS | 0 refills | Status: AC

## 2016-02-07 NOTE — Progress Notes (Signed)
Subjective:    Patient ID: Juan Ray, male    DOB: 07-20-1960, 56 y.o.   MRN: [id number]  HPI Patient has 2 problems.  First he reports a burning pins and needles pain in both feet. He states that his feet feel like they're on fire. He keeps him awake at night. The pain can be severe. He has a difficult time sleeping. He has a history of uncontrolled diabetes mellitus. Second issue is itching all over his body. He has been attributing this to the diabetes however his son recently came to visit him and exposed him to scabies. He has been treated once but he continues to itch on his torso and on his trunk. He is clawing at his skin with sticks and a back scratcher as it is itching so  Past Medical History:  Diagnosis Date  . CAD (coronary artery disease) 04/07/2012   Inferoposterior STEMI s/p DES-mid LCx  . Diabetes mellitus   . High cholesterol   . Hypertension   . Myocardial infarction   . Obesity   . Pancreatitis    a. mild by CT 06/2011  . Peptic ulcer disease    a. 06/2009 EGD: multiple gastric and duodenal ulcers.   Past Surgical History:  Procedure Laterality Date  . CORONARY ANGIOPLASTY WITH STENT PLACEMENT  04/07/2012   70% prox LAD, 70-80% ostial diagonal, 70% mid-distal LAD, 80% prox OM1, mid LCx totally occluded just distal to OM1 s/p DES, occluded RCA; severe inferior HK, LVEF 45%  . ESOPHAGOGASTRODUODENOSCOPY  06/18/2011   NL ESOPHAGUS/UlcerATED LESIONS/ SUPERFICIAL Ulcers  . LEFT HEART CATHETERIZATION WITH CORONARY ANGIOGRAM N/A 04/07/2012   Procedure: LEFT HEART CATHETERIZATION WITH CORONARY ANGIOGRAM;  Surgeon: Sherren Mocha, MD;  Location: [facility] CATH LAB;  Service: Cardiovascular;  Laterality: N/A;  . PERCUTANEOUS CORONARY STENT INTERVENTION (PCI-S)  04/07/2012   Procedure: PERCUTANEOUS CORONARY STENT INTERVENTION (PCI-S);  Surgeon: Sherren Mocha, MD;  Location: [facility] CATH LAB;  Service: Cardiovascular;;   Current Outpatient Prescriptions on File Prior to Visit  Medication  Sig Dispense Refill  . aspirin EC 81 MG EC tablet Take 1 tablet (81 mg total) by mouth daily.    . clopidogrel (PLAVIX) 75 MG tablet TAKE ONE TABLET BY MOUTH ONCE DAILY 30 tablet 3  . diphenoxylate-atropine (LOMOTIL) 2.5-0.025 MG tablet Take 2 tablets by mouth 4 (four) times daily as needed for diarrhea or loose stools. 30 tablet 0  . famotidine (PEPCID) 20 MG tablet Take 1 tablet (20 mg total) by mouth 2 (two) times daily. 180 tablet 3  . glipiZIDE (GLUCOTROL) 10 MG tablet TAKE ONE TABLET BY MOUTH TWICE DAILY BEFORE A MEAL 60 tablet 3  . lisinopril (PRINIVIL,ZESTRIL) 2.5 MG tablet Take 1 tablet (2.5 mg total) by mouth daily. 90 tablet 3  . metFORMIN (GLUCOPHAGE) 1000 MG tablet Take 1 tablet (1,000 mg total) by mouth 2 (two) times daily. 60 tablet 3  . metoprolol tartrate (LOPRESSOR) 25 MG tablet Take 0.5 tablets (12.5 mg total) by mouth 2 (two) times daily. 30 tablet 3  . nitroGLYCERIN (NITROSTAT) 0.4 MG SL tablet Place 1 tablet (0.4 mg total) under the tongue every 5 (five) minutes x 3 doses as needed for chest pain. 25 tablet 3  . pravastatin (PRAVACHOL) 40 MG tablet Take 1 tablet (40 mg total) by mouth every evening. 30 tablet 3  . promethazine (PHENERGAN) 12.5 MG tablet Take 1 tablet (12.5 mg total) by mouth every 6 (six) hours as needed for nausea or vomiting. 30 tablet 0  No current facility-administered medications on file prior to visit.    No Known Allergies Social History   Social History  . Marital status: Married    Spouse name: N/A  . Number of children: N/A  . Years of education: N/A   Occupational History  . Not on file.   Social History Main Topics  . Smoking status: Former Research scientist (life sciences)  . Smokeless tobacco: Former Systems developer    Quit date: 01/01/1998  . Alcohol use No  . Drug use: No  . Sexual activity: Yes   Other Topics Concern  . Not on file   Social History Narrative   Lives in [place], with his wife and 24 yr old son.      Review of Systems  All other systems  reviewed and are negative.      Objective:   Physical Exam  Cardiovascular: Normal rate, regular rhythm, normal heart sounds and intact distal pulses.   Pulmonary/Chest: Effort normal and breath sounds normal.  Skin: No rash noted. No erythema.  Vitals reviewed.         Assessment & Plan:  Type 2 diabetes mellitus with diabetic neuropathy, without long-term current use of insulin (HCC) - Plan: gabapentin (NEURONTIN) 300 MG capsule  I'll treat his diabetic neuropathy in his feet and legs with gabapentin 300 mg by mouth every 8 hours when necessary pain. I cautioned the patient about sedation and dizziness. However I believe the itching in his trunk and on his back may be residual scabies infestation. I recommended trying a repeat course of treatment with Elimite cream. He is to apply this head to toe and rinse off after 8 hours and repeat in 1 week. Also recommended washing all his bed close, etc. to avoid reinfection

## 2016-02-09 ENCOUNTER — Other Ambulatory Visit: Payer: Self-pay | Admitting: Physician Assistant

## 2016-02-21 ENCOUNTER — Ambulatory Visit (INDEPENDENT_AMBULATORY_CARE_PROVIDER_SITE_OTHER): Payer: BLUE CROSS/BLUE SHIELD | Admitting: Family Medicine

## 2016-02-21 ENCOUNTER — Encounter: Payer: Self-pay | Admitting: Family Medicine

## 2016-02-21 VITALS — BP 154/98 | HR 88 | Temp 98.2°F | Resp 18 | Ht 69.0 in | Wt 240.0 lb

## 2016-02-21 DIAGNOSIS — E1141 Type 2 diabetes mellitus with diabetic mononeuropathy: Secondary | ICD-10-CM

## 2016-02-21 DIAGNOSIS — R0781 Pleurodynia: Secondary | ICD-10-CM

## 2016-02-21 DIAGNOSIS — I10 Essential (primary) hypertension: Secondary | ICD-10-CM | POA: Diagnosis not present

## 2016-02-21 DIAGNOSIS — E1140 Type 2 diabetes mellitus with diabetic neuropathy, unspecified: Secondary | ICD-10-CM | POA: Diagnosis not present

## 2016-02-21 MED ORDER — OXYCODONE-ACETAMINOPHEN 7.5-325 MG PO TABS: 1.0000 | ORAL_TABLET | ORAL | 0 refills | Status: DC | PRN

## 2016-02-21 MED ORDER — PREGABALIN 100 MG PO CAPS: 100.0000 mg | ORAL_CAPSULE | Freq: Three times a day (TID) | ORAL | 2 refills | Status: DC

## 2016-02-21 MED ORDER — LISINOPRIL 20 MG PO TABS: 20.0000 mg | ORAL_TABLET | Freq: Every day | ORAL | 5 refills | Status: DC

## 2016-02-21 NOTE — Progress Notes (Signed)
Subjective:    Patient ID: Juan Ray, male    DOB: 1960/04/27, 56 y.o.   MRN: [id number]  HPI   02/07/16 Patient has 2 problems.  First he reports a burning pins and needles pain in both feet. He states that his feet feel like they're on fire. He keeps him awake at night. The pain can be severe. He has a difficult time sleeping. He has a history of uncontrolled diabetes mellitus. Second issue is itching all over his body. He has been attributing this to the diabetes however his son recently came to visit him and exposed him to scabies. He has been treated once but he continues to itch on his torso and on his trunk. He is clawing at his skin with sticks and a back scratcher as it is itching so.  At that time, my plan was:  I'll treat his diabetic neuropathy in his feet and legs with gabapentin 300 mg by mouth every 8 hours when necessary pain. I cautioned the patient about sedation and dizziness. However I believe the itching in his trunk and on his back may be residual scabies infestation. I recommended trying a repeat course of treatment with Elimite cream. He is to apply this head to toe and rinse off after 8 hours and repeat in 1 week. Also recommended washing all his bed close, etc. to avoid reinfection.  02/21/16 This taking gabapentin 300 mg 3 times a day, the patient states that the pins and needles sensation in his feet is approximately 40% better. The pain in his legs has improved but is still keeps him awake at night and causes him pain throughout the day. He is interested in other options to help manage the pain. He also believes he may have broken a rib on the left side. He felt a pop while pressing against his chest at work. He is nontender to palpation over the rib just below his left nipple. His blood pressure today is also elevated. This is the second time his blood pressure is been elevated. He is on lisinopril 2.5 mg a day. Past Medical History:  Diagnosis Date  . CAD (coronary  artery disease) 04/07/2012   Inferoposterior STEMI s/p DES-mid LCx  . Diabetes mellitus   . High cholesterol   . Hypertension   . Myocardial infarction   . Obesity   . Pancreatitis    a. mild by CT 06/2011  . Peptic ulcer disease    a. 06/2009 EGD: multiple gastric and duodenal ulcers.   Past Surgical History:  Procedure Laterality Date  . CORONARY ANGIOPLASTY WITH STENT PLACEMENT  04/07/2012   70% prox LAD, 70-80% ostial diagonal, 70% mid-distal LAD, 80% prox OM1, mid LCx totally occluded just distal to OM1 s/p DES, occluded RCA; severe inferior HK, LVEF 45%  . ESOPHAGOGASTRODUODENOSCOPY  06/18/2011   NL ESOPHAGUS/UlcerATED LESIONS/ SUPERFICIAL Ulcers  . LEFT HEART CATHETERIZATION WITH CORONARY ANGIOGRAM N/A 04/07/2012   Procedure: LEFT HEART CATHETERIZATION WITH CORONARY ANGIOGRAM;  Surgeon: Sherren Mocha, MD;  Location: [facility] CATH LAB;  Service: Cardiovascular;  Laterality: N/A;  . PERCUTANEOUS CORONARY STENT INTERVENTION (PCI-S)  04/07/2012   Procedure: PERCUTANEOUS CORONARY STENT INTERVENTION (PCI-S);  Surgeon: Sherren Mocha, MD;  Location: [facility] CATH LAB;  Service: Cardiovascular;;   Current Outpatient Prescriptions on File Prior to Visit  Medication Sig Dispense Refill  . aspirin EC 81 MG EC tablet Take 1 tablet (81 mg total) by mouth daily.    . clopidogrel (PLAVIX) 75 MG tablet TAKE ONE  TABLET BY MOUTH ONCE DAILY 30 tablet 3  . diphenoxylate-atropine (LOMOTIL) 2.5-0.025 MG tablet Take 2 tablets by mouth 4 (four) times daily as needed for diarrhea or loose stools. 30 tablet 0  . famotidine (PEPCID) 20 MG tablet TAKE ONE TABLET BY MOUTH TWICE DAILY 60 tablet 0  . gabapentin (NEURONTIN) 300 MG capsule Take 1 capsule (300 mg total) by mouth 3 (three) times daily. 90 capsule 3  . glipiZIDE (GLUCOTROL) 10 MG tablet TAKE ONE TABLET BY MOUTH TWICE DAILY BEFORE A MEAL 60 tablet 3  . lisinopril (PRINIVIL,ZESTRIL) 2.5 MG tablet Take 1 tablet (2.5 mg total) by mouth daily. 90 tablet 3  . metFORMIN  (GLUCOPHAGE) 1000 MG tablet Take 1 tablet (1,000 mg total) by mouth 2 (two) times daily. 60 tablet 3  . metoprolol tartrate (LOPRESSOR) 25 MG tablet Take 0.5 tablets (12.5 mg total) by mouth 2 (two) times daily. 30 tablet 3  . nitroGLYCERIN (NITROSTAT) 0.4 MG SL tablet Place 1 tablet (0.4 mg total) under the tongue every 5 (five) minutes x 3 doses as needed for chest pain. 25 tablet 3  . pravastatin (PRAVACHOL) 40 MG tablet Take 1 tablet (40 mg total) by mouth every evening. 30 tablet 3  . promethazine (PHENERGAN) 12.5 MG tablet Take 1 tablet (12.5 mg total) by mouth every 6 (six) hours as needed for nausea or vomiting. 30 tablet 0   No current facility-administered medications on file prior to visit.    No Known Allergies Social History   Social History  . Marital status: Married    Spouse name: N/A  . Number of children: N/A  . Years of education: N/A   Occupational History  . Not on file.   Social History Main Topics  . Smoking status: Former Research scientist (life sciences)  . Smokeless tobacco: Former Systems developer    Quit date: 01/01/1998  . Alcohol use No  . Drug use: No  . Sexual activity: Yes   Other Topics Concern  . Not on file   Social History Narrative   Lives in [place], with his wife and 35 yr old son.      Review of Systems  All other systems reviewed and are negative.      Objective:   Physical Exam  Cardiovascular: Normal rate, regular rhythm, normal heart sounds and intact distal pulses.   Pulmonary/Chest: Effort normal and breath sounds normal. He exhibits tenderness.  Skin: No rash noted. No erythema.  Vitals reviewed.         Assessment & Plan:  Type 2 diabetes mellitus with diabetic neuropathy, without long-term current use of insulin (HCC)  Diabetic mononeuropathy associated with type 2 diabetes mellitus ([place])  Essential hypertension - Plan: lisinopril (PRINIVIL,ZESTRIL) 20 MG tablet  Benign essential HTN  Rib pain on left side  Patient states that his blood  sugar now down to 150. I'm concerned by his elevated blood pressure. I will increase his lisinopril to 20 mg a day and recheck his blood pressure in one month. I gave the patient the option of increasing gabapentin to 600 mg 3 times a day versus switching gabapentin to Lyrica 100 mg by mouth 3 times a day. He would like to try the Lyrica first. Also gave him Percocet 7.5/325 but I recommended one half tablet every 6 hours as needed for rib pain. I also cautioned him against taking the pain medication with the Lyrica until he sees how his body adjusts to the Lyrica

## 2016-02-22 ENCOUNTER — Telehealth: Payer: Self-pay | Admitting: Family Medicine

## 2016-02-22 NOTE — Telephone Encounter (Signed)
PA submitted through [email or web address] and received the following - Your information has been submitted to [employer] [place]. [employer] [place] will review the request and fax you a determination directly, typically within 3 business days of your submission once all necessary information is received.If [employer] [place] has not responded in 3 business days or if you have any questions about your submission, contact [employer] [place] at [phone]. 

## 2016-02-23 NOTE — Telephone Encounter (Signed)
Lyrical has been approved, pt and pharm aware.

## 2016-02-27 ENCOUNTER — Telehealth: Payer: Self-pay | Admitting: Family Medicine

## 2016-02-27 DIAGNOSIS — Z1211 Encounter for screening for malignant neoplasm of colon: Secondary | ICD-10-CM

## 2016-02-27 NOTE — Telephone Encounter (Signed)
Pt has checked with Insurance and says to go ahead and schedule referral for GI to Colonoscopy.  Referral placed.

## 2016-03-06 ENCOUNTER — Telehealth: Payer: Self-pay

## 2016-03-06 ENCOUNTER — Telehealth: Payer: Self-pay | Admitting: Family Medicine

## 2016-03-06 DIAGNOSIS — E1140 Type 2 diabetes mellitus with diabetic neuropathy, unspecified: Secondary | ICD-10-CM

## 2016-03-06 MED ORDER — GABAPENTIN 300 MG PO CAPS: 300.0000 mg | ORAL_CAPSULE | Freq: Three times a day (TID) | ORAL | 3 refills | Status: DC

## 2016-03-06 NOTE — Telephone Encounter (Signed)
Pt wants refill on gabapentin sent to [facility] in [place].

## 2016-03-06 NOTE — Telephone Encounter (Signed)
Pt's wife, Lenna Sciara, left VM that he is ready to schedule colonoscopy.

## 2016-03-06 NOTE — Telephone Encounter (Signed)
Medication called/sent to requested pharmacy  

## 2016-03-07 ENCOUNTER — Telehealth: Payer: Self-pay

## 2016-03-07 NOTE — Telephone Encounter (Signed)
See separate triage.  

## 2016-03-11 ENCOUNTER — Observation Stay (HOSPITAL_COMMUNITY)
Admission: EM | Admit: 2016-03-11 | Discharge: 2016-03-12 | Disposition: A | Discharge status: Home / Self Care | Payer: BLUE CROSS/BLUE SHIELD | Attending: Internal Medicine | Admitting: Internal Medicine

## 2016-03-11 ENCOUNTER — Encounter (HOSPITAL_COMMUNITY): Payer: Self-pay | Admitting: Emergency Medicine

## 2016-03-11 ENCOUNTER — Emergency Department (HOSPITAL_COMMUNITY): Payer: BLUE CROSS/BLUE SHIELD

## 2016-03-11 DIAGNOSIS — I10 Essential (primary) hypertension: Secondary | ICD-10-CM | POA: Diagnosis not present

## 2016-03-11 DIAGNOSIS — R072 Precordial pain: Secondary | ICD-10-CM | POA: Diagnosis not present

## 2016-03-11 DIAGNOSIS — R0789 Other chest pain: Secondary | ICD-10-CM | POA: Diagnosis not present

## 2016-03-11 DIAGNOSIS — E109 Type 1 diabetes mellitus without complications: Secondary | ICD-10-CM

## 2016-03-11 DIAGNOSIS — Z7982 Long term (current) use of aspirin: Secondary | ICD-10-CM | POA: Diagnosis not present

## 2016-03-11 DIAGNOSIS — R079 Chest pain, unspecified: Secondary | ICD-10-CM

## 2016-03-11 DIAGNOSIS — E782 Mixed hyperlipidemia: Secondary | ICD-10-CM | POA: Diagnosis present

## 2016-03-11 DIAGNOSIS — I2510 Atherosclerotic heart disease of native coronary artery without angina pectoris: Secondary | ICD-10-CM | POA: Diagnosis not present

## 2016-03-11 DIAGNOSIS — E785 Hyperlipidemia, unspecified: Secondary | ICD-10-CM | POA: Diagnosis present

## 2016-03-11 DIAGNOSIS — Z87891 Personal history of nicotine dependence: Secondary | ICD-10-CM | POA: Insufficient documentation

## 2016-03-11 DIAGNOSIS — E119 Type 2 diabetes mellitus without complications: Secondary | ICD-10-CM | POA: Insufficient documentation

## 2016-03-11 DIAGNOSIS — E1159 Type 2 diabetes mellitus with other circulatory complications: Secondary | ICD-10-CM

## 2016-03-11 DIAGNOSIS — Z7984 Long term (current) use of oral hypoglycemic drugs: Secondary | ICD-10-CM | POA: Diagnosis not present

## 2016-03-11 DIAGNOSIS — E7800 Pure hypercholesterolemia, unspecified: Secondary | ICD-10-CM | POA: Diagnosis not present

## 2016-03-11 LAB — BASIC METABOLIC PANEL
ANION GAP: 7 (ref 5–15)
BUN: 23 mg/dL — ABNORMAL HIGH (ref 6–20)
CALCIUM: 9.2 mg/dL (ref 8.9–10.3)
CO2: 26 mmol/L (ref 22–32)
CREATININE: 0.82 mg/dL (ref 0.61–1.24)
Chloride: 104 mmol/L (ref 101–111)
GFR calc non Af Amer: >60 mL/min (ref >60)
Glucose, Bld: 285 mg/dL — ABNORMAL HIGH (ref 65–99)
Potassium: 4 mmol/L (ref 3.5–5.1)
SODIUM: 137 mmol/L (ref 135–145)

## 2016-03-11 LAB — GLUCOSE, CAPILLARY: Glucose-Capillary: 264 mg/dL — ABNORMAL HIGH (ref 65–99)

## 2016-03-11 LAB — CBC
HCT: 42.4 % (ref 39.0–52.0)
HEMOGLOBIN: 15.4 g/dL (ref 13.0–17.0)
MCH: 33.2 pg (ref 26.0–34.0)
MCHC: 36.3 g/dL — ABNORMAL HIGH (ref 30.0–36.0)
MCV: 91.4 fL (ref 78.0–100.0)
PLATELETS: 264 10*3/uL (ref 150–400)
RBC: 4.64 MIL/uL (ref 4.22–5.81)
RDW: 13.6 % (ref 11.5–15.5)
WBC: 9.2 10*3/uL (ref 4.0–10.5)

## 2016-03-11 LAB — TROPONIN I: Troponin I: <0.03 ng/mL (ref <0.03)

## 2016-03-11 MED ORDER — MORPHINE SULFATE (PF) 2 MG/ML IV SOLN
2.0000 mg | INTRAVENOUS | Status: DC | PRN
Start: 2016-03-11 — End: 2016-03-12

## 2016-03-11 MED ORDER — FAMOTIDINE 20 MG PO TABS
20.0000 mg | ORAL_TABLET | Freq: Two times a day (BID) | ORAL | Status: DC
  Administered 2016-03-11 – 2016-03-12 (×2): 20 mg via ORAL
  Filled 2016-03-11 (×2): qty 1

## 2016-03-11 MED ORDER — LISINOPRIL 10 MG PO TABS
10.0000 mg | ORAL_TABLET | Freq: Every day | ORAL | Status: DC
  Administered 2016-03-12: 10 mg via ORAL
  Filled 2016-03-11: qty 1

## 2016-03-11 MED ORDER — CLOPIDOGREL BISULFATE 75 MG PO TABS
75.0000 mg | ORAL_TABLET | Freq: Every day | ORAL | Status: DC
  Administered 2016-03-12: 75 mg via ORAL
  Filled 2016-03-11: qty 1

## 2016-03-11 MED ORDER — ATORVASTATIN CALCIUM 20 MG PO TABS: 20.0000 mg | ORAL_TABLET | Freq: Every day | ORAL | Status: DC

## 2016-03-11 MED ORDER — GABAPENTIN 300 MG PO CAPS
600.0000 mg | ORAL_CAPSULE | Freq: Three times a day (TID) | ORAL | Status: DC
  Administered 2016-03-11 – 2016-03-12 (×2): 600 mg via ORAL
  Filled 2016-03-11 (×2): qty 2

## 2016-03-11 MED ORDER — INSULIN ASPART 100 UNIT/ML ~~LOC~~ SOLN
0.0000 [IU] | Freq: Every day | SUBCUTANEOUS | Status: DC
  Administered 2016-03-11: 3 [IU] via SUBCUTANEOUS

## 2016-03-11 MED ORDER — ONDANSETRON HCL 4 MG/2ML IJ SOLN: 4.0000 mg | Freq: Four times a day (QID) | INTRAMUSCULAR | Status: DC | PRN

## 2016-03-11 MED ORDER — ENOXAPARIN SODIUM 40 MG/0.4ML ~~LOC~~ SOLN
40.0000 mg | SUBCUTANEOUS | Status: DC
  Administered 2016-03-11: 40 mg via SUBCUTANEOUS
  Filled 2016-03-11: qty 0.4

## 2016-03-11 MED ORDER — INSULIN ASPART 100 UNIT/ML ~~LOC~~ SOLN
0.0000 [IU] | Freq: Three times a day (TID) | SUBCUTANEOUS | Status: DC
  Administered 2016-03-12: 5 [IU] via SUBCUTANEOUS
  Administered 2016-03-12: 3 [IU] via SUBCUTANEOUS

## 2016-03-11 MED ORDER — ASPIRIN EC 81 MG PO TBEC
81.0000 mg | DELAYED_RELEASE_TABLET | Freq: Every day | ORAL | Status: DC
  Administered 2016-03-11 – 2016-03-12 (×2): 81 mg via ORAL
  Filled 2016-03-11 (×4): qty 1

## 2016-03-11 MED ORDER — METOPROLOL TARTRATE 25 MG PO TABS
12.5000 mg | ORAL_TABLET | Freq: Two times a day (BID) | ORAL | Status: DC
  Administered 2016-03-11 – 2016-03-12 (×2): 12.5 mg via ORAL
  Filled 2016-03-11 (×2): qty 1

## 2016-03-11 MED ORDER — PRAVASTATIN SODIUM 40 MG PO TABS
40.0000 mg | ORAL_TABLET | Freq: Every evening | ORAL | Status: DC
  Administered 2016-03-11: 40 mg via ORAL
  Filled 2016-03-11: qty 1

## 2016-03-11 MED ORDER — OXYCODONE-ACETAMINOPHEN 7.5-325 MG PO TABS
1.0000 | ORAL_TABLET | ORAL | Status: DC | PRN
  Administered 2016-03-11: 1 via ORAL
  Filled 2016-03-11: qty 1

## 2016-03-11 MED ORDER — ACETAMINOPHEN 325 MG PO TABS: 650.0000 mg | ORAL_TABLET | ORAL | Status: DC | PRN

## 2016-03-11 MED ORDER — GI COCKTAIL ~~LOC~~: 30.0000 mL | Freq: Four times a day (QID) | ORAL | Status: DC | PRN

## 2016-03-11 NOTE — ED Notes (Signed)
Dr Yates in to assess 

## 2016-03-11 NOTE — H&P (Signed)
History and Physical    Jeremih Dearmas SHF:[id number] DOB: 05-02-1960 DOA: 03/11/2016  PCP: Odette Fraction, MD Consultants:  Burt Knack - cardiology Patient coming from: home - lives with wife and 2 children; NOK: wife, [phone]  Chief Complaint: chest pain  HPI: Juan Ray is a 56 y.o. male with medical history significant of DM, HTN, HLD, and h/o STEMI with stent placement in 4/14 presenting with chest pain this AM, acute onset about 11-12.  Was installing 2 lights in living room when it started, got worse after he finished.  Checked BP and it was 91/43, which is low for him.  Previously on 5 mg Lisinopril, but it was increased to 20 mg about 1-2 weeks ago.  Since then he has been having dizzy spells, feeling weak, little energy, wants to sleep all the time.  Intermittent chest pain since then too.    No associated symptoms.  Lasted 30-45 minutes.  Resolved spontaneously in route.Takes 81 mg ASA daily.  MI with stent in 2014, post-stress test 3 months later.    ED Course: Negative evaluation.  Review of Systems: As per HPI; otherwise 10 point review of systems reviewed and negative.   Ambulatory Status:  Ambulates without assistance  Past Medical History:  Diagnosis Date  . CAD (coronary artery disease) 04/07/2012   Inferoposterior STEMI s/p DES-mid LCx  . Diabetes mellitus   . Diabetic neuropathy ([place])   . High cholesterol   . Hypertension   . Myocardial infarction   . Obesity   . Pancreatitis    a. mild by CT 06/2011  . Peptic ulcer disease    a. 06/2009 EGD: multiple gastric and duodenal ulcers.    Past Surgical History:  Procedure Laterality Date  . CORONARY ANGIOPLASTY WITH STENT PLACEMENT  04/07/2012   70% prox LAD, 70-80% ostial diagonal, 70% mid-distal LAD, 80% prox OM1, mid LCx totally occluded just distal to OM1 s/p DES, occluded RCA; severe inferior HK, LVEF 45%  . ESOPHAGOGASTRODUODENOSCOPY  06/18/2011   NL ESOPHAGUS/UlcerATED LESIONS/ SUPERFICIAL Ulcers  .  LEFT HEART CATHETERIZATION WITH CORONARY ANGIOGRAM N/A 04/07/2012   Procedure: LEFT HEART CATHETERIZATION WITH CORONARY ANGIOGRAM;  Surgeon: Sherren Mocha, MD;  Location: [facility] CATH LAB;  Service: Cardiovascular;  Laterality: N/A;  . PERCUTANEOUS CORONARY STENT INTERVENTION (PCI-S)  04/07/2012   Procedure: PERCUTANEOUS CORONARY STENT INTERVENTION (PCI-S);  Surgeon: Sherren Mocha, MD;  Location: [facility] CATH LAB;  Service: Cardiovascular;;    Social History   Social History  . Marital status: Married    Spouse name: N/A  . Number of children: N/A  . Years of education: N/A   Occupational History  . truck driver    Social History Main Topics  . Smoking status: Former Smoker    Years: 20.00    Quit date: 2000  . Smokeless tobacco: Former Systems developer    Quit date: 01/01/1998  . Alcohol use No  . Drug use: No  . Sexual activity: Yes   Other Topics Concern  . Not on file   Social History Narrative   Lives in [place], with his wife and 57 yr old son.    No Known Allergies  Family History  Problem Relation Age of Onset  . Heart attack Father     MI x 2 in late 50's, currently 38's  . CVA Sister     late 57s  . CAD Maternal Uncle     MI at 4    Prior to Admission medications   Medication Sig Start Date  End Date Taking? Authorizing Provider  aspirin EC 81 MG EC tablet Take 1 tablet (81 mg total) by mouth daily. 04/09/12   Roger A Arguello, PA-C  clopidogrel (PLAVIX) 75 MG tablet TAKE ONE TABLET BY MOUTH ONCE DAILY 02/02/16   Susy Frizzle, MD  diphenoxylate-atropine (LOMOTIL) 2.5-0.025 MG tablet Take 2 tablets by mouth 4 (four) times daily as needed for diarrhea or loose stools. Patient not taking: Reported on 03/07/2016 01/27/16   Susy Frizzle, MD  famotidine (PEPCID) 20 MG tablet TAKE ONE TABLET BY MOUTH TWICE DAILY 02/10/16   Liliane Shi, PA-C  gabapentin (NEURONTIN) 300 MG capsule Take 1 capsule (300 mg total) by mouth 3 (three) times daily. 03/06/16   Susy Frizzle, MD  glipiZIDE  (GLUCOTROL) 10 MG tablet TAKE ONE TABLET BY MOUTH TWICE DAILY BEFORE A MEAL 02/02/16   Susy Frizzle, MD  lisinopril (PRINIVIL,ZESTRIL) 20 MG tablet Take 1 tablet (20 mg total) by mouth daily. 02/21/16   Susy Frizzle, MD  metFORMIN (GLUCOPHAGE) 1000 MG tablet Take 1 tablet (1,000 mg total) by mouth 2 (two) times daily. 02/02/16   Susy Frizzle, MD  metoprolol tartrate (LOPRESSOR) 25 MG tablet Take 0.5 tablets (12.5 mg total) by mouth 2 (two) times daily. 02/02/16   Susy Frizzle, MD  nitroGLYCERIN (NITROSTAT) 0.4 MG SL tablet Place 1 tablet (0.4 mg total) under the tongue every 5 (five) minutes x 3 doses as needed for chest pain. Patient not taking: Reported on 03/07/2016 02/07/15   Liliane Shi, PA-C  oxyCODONE-acetaminophen (PERCOCET) 7.5-325 MG tablet Take 1 tablet by mouth every 4 (four) hours as needed for severe pain. 02/21/16   Susy Frizzle, MD  pravastatin (PRAVACHOL) 40 MG tablet Take 1 tablet (40 mg total) by mouth every evening. 02/02/16   Susy Frizzle, MD  pregabalin (LYRICA) 100 MG capsule Take 1 capsule (100 mg total) by mouth 3 (three) times daily. Patient not taking: Reported on 03/07/2016 02/21/16   Susy Frizzle, MD  promethazine (PHENERGAN) 12.5 MG tablet Take 1 tablet (12.5 mg total) by mouth every 6 (six) hours as needed for nausea or vomiting. Patient not taking: Reported on 03/07/2016 01/27/16   Susy Frizzle, MD    Physical Exam: Vitals:   03/11/16 1512 03/11/16 1659  BP: 112/71 131/89  Pulse: 96 87  Resp: 18 18  Temp: 98.8 F (37.1 C)   SpO2: 98% 99%  Weight: 108.9 kg (240 lb)   Height: 5\' 8"  (1.727 m)      General:  Appears calm and comfortable and is NAD Eyes:  PERRL, EOMI, normal lids, iris ENT:  grossly normal hearing, lips & tongue, mmm Neck:  no LAD, masses or thyromegaly Cardiovascular:  RRR, no m/r/g. No LE edema.  Respiratory:  CTA bilaterally, no w/r/r. Normal respiratory effort. Abdomen:  soft, ntnd, NABS Skin:  no rash or induration  seen on limited exam Musculoskeletal:  grossly normal tone BUE/BLE, good ROM, no bony abnormality Psychiatric:  grossly normal mood and affect, speech fluent and appropriate, AOx3 Neurologic:  CN 2-12 grossly intact, moves all extremities in coordinated fashion, sensation intact  Labs on Admission: I have personally reviewed following labs and imaging studies  CBC:  Recent Labs Lab 03/11/16 1551  WBC 9.2  HGB 15.4  HCT 42.4  MCV 91.4  PLT 086   Basic Metabolic Panel:  Recent Labs Lab 03/11/16 1551  NA 137  K 4.0  CL 104  CO2 26  GLUCOSE 285*  BUN 23*  CREATININE 0.82  CALCIUM 9.2   GFR: Estimated Creatinine Clearance: 118.9 mL/min (by C-G formula based on SCr of 0.82 mg/dL). Liver Function Tests: No results for input(s): AST, ALT, ALKPHOS, BILITOT, PROT, ALBUMIN in the last 168 hours. No results for input(s): LIPASE, AMYLASE in the last 168 hours. No results for input(s): AMMONIA in the last 168 hours. Coagulation Profile: No results for input(s): INR, PROTIME in the last 168 hours. Cardiac Enzymes:  Recent Labs Lab 03/11/16 1551  TROPONINI <0.03   BNP (last 3 results) No results for input(s): PROBNP in the last 8760 hours. HbA1C: No results for input(s): HGBA1C in the last 72 hours. CBG: No results for input(s): GLUCAP in the last 168 hours. Lipid Profile: No results for input(s): CHOL, HDL, LDLCALC, TRIG, CHOLHDL, LDLDIRECT in the last 72 hours. Thyroid Function Tests: No results for input(s): TSH, T4TOTAL, FREET4, T3FREE, THYROIDAB in the last 72 hours. Anemia Panel: No results for input(s): VITAMINB12, FOLATE, FERRITIN, TIBC, IRON, RETICCTPCT in the last 72 hours. Urine analysis:    Component Value Date/Time   COLORURINE YELLOW 06/18/2011 [place] 06/18/2011 0242   LABSPEC 1.025 06/18/2011 0242   PHURINE 5.5 06/18/2011 0242   GLUCOSEU >1000 (A) 06/18/2011 0242   HGBUR NEGATIVE 06/18/2011 0242   BILIRUBINUR NEGATIVE 06/18/2011  0242   KETONESUR TRACE (A) 06/18/2011 0242   PROTEINUR NEGATIVE 06/18/2011 0242   UROBILINOGEN 0.2 06/18/2011 0242   NITRITE NEGATIVE 06/18/2011 0242   LEUKOCYTESUR NEGATIVE 06/18/2011 0242    Creatinine Clearance: Estimated Creatinine Clearance: 118.9 mL/min (by C-G formula based on SCr of 0.82 mg/dL).  Sepsis Labs: @LABRCNTIP (procalcitonin:4,lacticidven:4) )No results found for this or any previous visit (from the past 240 hour(s)).   Radiological Exams on Admission: Dg Chest 2 View  Result Date: 03/11/2016 CLINICAL DATA:  Chest pain EXAM: CHEST  2 VIEW COMPARISON:  07/28/2015 FINDINGS: The heart size and mediastinal contours are within normal limits. Both lungs are clear. The visualized skeletal structures are unremarkable. IMPRESSION: No active cardiopulmonary disease. Electronically Signed   By: Inez Catalina M.D.   On: 03/11/2016 15:34    EKG: Independently reviewed.  NSR with rate 98; incomplete RBBB, LAFB, and nonspecific ST changes with no evidence of acute ischemia  Assessment/Plan Principal Problem:   Chest pain Active Problems:   HTN (hypertension)   DM (diabetes mellitus) ([place])   Hyperlipidemia   Chest pain -Patient with substernal chest pressure that has come on intermittently for days but appeared to have been exertional today, resolved spontaneously. -2/3 typical symptoms suggestive of atypical chest pain.  -CXR unremarkable.   -Initial cardiac troponin negative.  -EKG not indicative of acute ischemia but also with nonspecific ST changes that appear to be new.   -GRACE score is 91; which predicts an in-hospital death rate of 0.6%.  -Will plan to place in observation status on telemetry to rule out ACS by overnight observation.  -cycle troponin q6h x 3 and repeat EKG in AM -Continue ASA 81 mg and Plavix daily -morphine given -Risk factor stratification with HgbA1c and FLP not needed (see below); will check TSH and UDS -Cardiology consultation in AM - NPO for  possible stress test  -Will plan to start Heparin drip if enzymes are positive and/or chest pain recurs  HTN -Takes Lisinopril and Lopressor at home -Patient with appropriate control while in the ER  HLD -Lipids were checked 01/27/16: TC 139, HDL 25, LDL 78, TG 182 -Given h/o CAD  and current c/o CP, will stop home Pravachol and change to Lipitor 20 mg qhs  DM -Glucose 285 -Last A1c was 10.2 on 01/27/16 -Hold home glucotrol and Glucophage -Based on A1c, he would likely benefit from long-term treatment with basal insulin -This is likely to also improve his peripheral neuropathy -Will cover with SSI for now    DVT prophylaxis: Lovenox  Code Status: Full - confirmed with patient/family Family Communication: Wife present throughout evaluation Disposition Plan:  Home once clinically improved Consults called: Cardiology  Admission status: It is my clinical opinion that referral for OBSERVATION is reasonable and necessary in this patient based on the above information provided. The aforementioned taken together are felt to place the patient at high risk for further clinical deterioration. However it is anticipated that the patient may be medically stable for discharge from the hospital within 24 to 48 hours.    Karmen Bongo MD [facility]  If 7PM-7AM, please contact night-coverage [email or web address] Password [facility]  03/11/2016, 8:09 PM

## 2016-03-11 NOTE — ED Triage Notes (Signed)
Pt c/o intermittent cp x 1 week and hypotension today. Pt also reports dizziness and generalized weakness x 1 week since PCP increased lisinopril from 5mg /day to 20mg /day.

## 2016-03-11 NOTE — ED Provider Notes (Signed)
[place] [facility] DEPT Provider Note   CSN: [id number] Arrival date & time: 03/11/16  1455     History   Chief Complaint Chief Complaint  Patient presents with  . Chest Pain    HPI Juan Ray is a 56 y.o. male.  HPI  Pt was seen at 1605. Per pt, c/o gradual onset and persistence of multiple intermittent episodes of chest "pain" for the past 1 week. Pt describes the CP as mid-sternal, occurs with activity, last approximately 30 minutes and improves with resting. Has been associated with lightheadedness and generalized weakness. Pt states his symptoms began after his Cards MD increased his lisinopril dose. Pt had a relative check his BP today and it was "90/47." Denies symptoms currently.  Denies SOB/cough, no palpitations, no abd pain, no N/V/D, no focal motor weakness, no tingling/numbness in extremities, no back pain.   Past Medical History:  Diagnosis Date  . CAD (coronary artery disease) 04/07/2012   Inferoposterior STEMI s/p DES-mid LCx  . Diabetes mellitus   . Diabetic neuropathy ([place])   . High cholesterol   . Hypertension   . Myocardial infarction   . Obesity   . Pancreatitis    a. mild by CT 06/2011  . Peptic ulcer disease    a. 06/2009 EGD: multiple gastric and duodenal ulcers.    Patient Active Problem List   Diagnosis Date Noted  . Ischemic cardiomyopathy 07/02/2013  . Midsternal chest pain 07/11/2012  . ST elevation myocardial infarction (STEMI) of inferoposterior wall ([place]) 04/09/2012  . CAD (coronary artery disease), native coronary artery 04/09/2012  . PUD (peptic ulcer disease) 04/09/2012  . Epigastric pain 06/18/2011  . HTN (hypertension) 06/18/2011  . DM (diabetes mellitus) ([place]) 06/18/2011  . Hyperlipidemia 06/18/2011    Past Surgical History:  Procedure Laterality Date  . CORONARY ANGIOPLASTY WITH STENT PLACEMENT  04/07/2012   70% prox LAD, 70-80% ostial diagonal, 70% mid-distal LAD, 80% prox OM1, mid LCx totally occluded just distal to OM1 s/p DES,  occluded RCA; severe inferior HK, LVEF 45%  . ESOPHAGOGASTRODUODENOSCOPY  06/18/2011   NL ESOPHAGUS/UlcerATED LESIONS/ SUPERFICIAL Ulcers  . LEFT HEART CATHETERIZATION WITH CORONARY ANGIOGRAM N/A 04/07/2012   Procedure: LEFT HEART CATHETERIZATION WITH CORONARY ANGIOGRAM;  Surgeon: Sherren Mocha, MD;  Location: [facility] CATH LAB;  Service: Cardiovascular;  Laterality: N/A;  . PERCUTANEOUS CORONARY STENT INTERVENTION (PCI-S)  04/07/2012   Procedure: PERCUTANEOUS CORONARY STENT INTERVENTION (PCI-S);  Surgeon: Sherren Mocha, MD;  Location: [facility] CATH LAB;  Service: Cardiovascular;;       Home Medications    Prior to Admission medications   Medication Sig Start Date End Date Taking? Authorizing Provider  aspirin EC 81 MG EC tablet Take 1 tablet (81 mg total) by mouth daily. 04/09/12   Roger A Arguello, PA-C  clopidogrel (PLAVIX) 75 MG tablet TAKE ONE TABLET BY MOUTH ONCE DAILY 02/02/16   Susy Frizzle, MD  diphenoxylate-atropine (LOMOTIL) 2.5-0.025 MG tablet Take 2 tablets by mouth 4 (four) times daily as needed for diarrhea or loose stools. Patient not taking: Reported on 03/07/2016 01/27/16   Susy Frizzle, MD  famotidine (PEPCID) 20 MG tablet TAKE ONE TABLET BY MOUTH TWICE DAILY 02/10/16   Liliane Shi, PA-C  gabapentin (NEURONTIN) 300 MG capsule Take 1 capsule (300 mg total) by mouth 3 (three) times daily. 03/06/16   Susy Frizzle, MD  glipiZIDE (GLUCOTROL) 10 MG tablet TAKE ONE TABLET BY MOUTH TWICE DAILY BEFORE A MEAL 02/02/16   Susy Frizzle, MD  lisinopril (PRINIVIL,ZESTRIL)  20 MG tablet Take 1 tablet (20 mg total) by mouth daily. 02/21/16   Susy Frizzle, MD  metFORMIN (GLUCOPHAGE) 1000 MG tablet Take 1 tablet (1,000 mg total) by mouth 2 (two) times daily. 02/02/16   Susy Frizzle, MD  metoprolol tartrate (LOPRESSOR) 25 MG tablet Take 0.5 tablets (12.5 mg total) by mouth 2 (two) times daily. 02/02/16   Susy Frizzle, MD  nitroGLYCERIN (NITROSTAT) 0.4 MG SL tablet Place 1 tablet (0.4 mg total)  under the tongue every 5 (five) minutes x 3 doses as needed for chest pain. Patient not taking: Reported on 03/07/2016 02/07/15   Liliane Shi, PA-C  oxyCODONE-acetaminophen (PERCOCET) 7.5-325 MG tablet Take 1 tablet by mouth every 4 (four) hours as needed for severe pain. 02/21/16   Susy Frizzle, MD  pravastatin (PRAVACHOL) 40 MG tablet Take 1 tablet (40 mg total) by mouth every evening. 02/02/16   Susy Frizzle, MD  pregabalin (LYRICA) 100 MG capsule Take 1 capsule (100 mg total) by mouth 3 (three) times daily. Patient not taking: Reported on 03/07/2016 02/21/16   Susy Frizzle, MD  promethazine (PHENERGAN) 12.5 MG tablet Take 1 tablet (12.5 mg total) by mouth every 6 (six) hours as needed for nausea or vomiting. Patient not taking: Reported on 03/07/2016 01/27/16   Susy Frizzle, MD    Family History Family History  Problem Relation Age of Onset  . Heart attack Father     MI x 2 in late 50's, currently 29's    Social History Social History  Substance Use Topics  . Smoking status: Former Research scientist (life sciences)  . Smokeless tobacco: Former Systems developer    Quit date: 01/01/1998  . Alcohol use No     Allergies   Patient has no known allergies.   Review of Systems Review of Systems ROS: Statement: All systems negative except as marked or noted in the HPI; Constitutional: Negative for fever and chills. ; ; Eyes: Negative for eye pain, redness and discharge. ; ; ENMT: Negative for ear pain, hoarseness, nasal congestion, sinus pressure and sore throat. ; ; Cardiovascular: +CP. Negative for palpitations, diaphoresis, dyspnea and peripheral edema. ; ; Respiratory: Negative for cough, wheezing and stridor. ; ; Gastrointestinal: Negative for nausea, vomiting, diarrhea, abdominal pain, blood in stool, hematemesis, jaundice and rectal bleeding. . ; ; Genitourinary: Negative for dysuria, flank pain and hematuria. ; ; Musculoskeletal: Negative for back pain and neck pain. Negative for swelling and trauma.; ; Skin:  Negative for pruritus, rash, abrasions, blisters, bruising and skin lesion.; ; Neuro: +lightheadedness, generalized weakness. Negative for headache and neck stiffness. Negative for altered level of consciousness, altered mental status, extremity weakness, paresthesias, involuntary movement, seizure and syncope.       Physical Exam Updated Vital Signs BP 112/71   Pulse 96   Temp 98.8 F (37.1 C)   Resp 18   Ht 5\' 8"  (1.727 m)   Wt 240 lb (108.9 kg)   SpO2 98%   BMI 36.49 kg/m   Physical Exam 1610: Physical examination:  Nursing notes reviewed; Vital signs and O2 SAT reviewed;  Constitutional: Well developed, Well nourished, Well hydrated, In no acute distress; Head:  Normocephalic, atraumatic; Eyes: EOMI, PERRL, No scleral icterus; ENMT: Mouth and pharynx normal, Mucous membranes moist; Neck: Supple, Full range of motion, No lymphadenopathy; Cardiovascular: Regular rate and rhythm, No gallop; Respiratory: Breath sounds clear & equal bilaterally, No wheezes.  Speaking full sentences with ease, Normal respiratory effort/excursion; Chest: Nontender, Movement normal; Abdomen:  Soft, Nontender, Nondistended, Normal bowel sounds; Genitourinary: No CVA tenderness; Extremities: Pulses normal, No tenderness, No edema, No calf edema or asymmetry.; Neuro: AA&Ox3, Major CN grossly intact.  Speech clear. No gross focal motor or sensory deficits in extremities.; Skin: Color normal, Warm, Dry.   ED Treatments / Results  Labs (all labs ordered are listed, but only abnormal results are displayed)   EKG  EKG Interpretation  Date/Time:  Sunday March 11 2016 15:14:26 EDT Ventricular Rate:  98 PR Interval:  180 QRS Duration: 104 QT Interval:  364 QTC Calculation: 464 R Axis:   -51 Text Interpretation:  Normal sinus rhythm Incomplete right bundle branch block Left anterior fascicular block Inferior infarct , age undetermined T wave abnormality Lateral leads When compared with ECG of 07/28/2015 T wave  abnormality Lateral leads is now Present Confirmed by [facility]  MD, Nunzio Cory [phone]) on 03/11/2016 4:15:46 PM       Radiology   Procedures Procedures (including critical care time)  Medications Ordered in ED Medications - No data to display   Initial Impression / Assessment and Plan / ED Course  I have reviewed the triage vital signs and the nursing notes.  Pertinent labs & imaging results that were available during my care of the patient were reviewed by me and considered in my medical decision making (see chart for details).  MDM Reviewed: previous chart, nursing note and vitals Reviewed previous: labs and ECG Interpretation: labs, ECG and x-ray   Results for orders placed or performed during the hospital encounter of 81/19/14  Basic metabolic panel  Result Value Ref Range   Sodium 137 135 - 145 mmol/L   Potassium 4.0 3.5 - 5.1 mmol/L   Chloride 104 101 - 111 mmol/L   CO2 26 22 - 32 mmol/L   Glucose, Bld 285 (H) 65 - 99 mg/dL   BUN 23 (H) 6 - 20 mg/dL   Creatinine, Ser 0.82 0.61 - 1.24 mg/dL   Calcium 9.2 8.9 - 10.3 mg/dL   GFR calc non Af Amer >60 >60 mL/min   GFR calc Af Amer >60 >60 mL/min   Anion gap 7 5 - 15  CBC  Result Value Ref Range   WBC 9.2 4.0 - 10.5 K/uL   RBC 4.64 4.22 - 5.81 MIL/uL   Hemoglobin 15.4 13.0 - 17.0 g/dL   HCT 42.4 39.0 - 52.0 %   MCV 91.4 78.0 - 100.0 fL   MCH 33.2 26.0 - 34.0 pg   MCHC 36.3 (H) 30.0 - 36.0 g/dL   RDW 13.6 11.5 - 15.5 %   Platelets 264 150 - 400 K/uL  Troponin I  Result Value Ref Range   Troponin I <0.03 <0.03 ng/mL   Dg Chest 2 View Result Date: 03/11/2016 CLINICAL DATA:  Chest pain EXAM: CHEST  2 VIEW COMPARISON:  07/28/2015 FINDINGS: The heart size and mediastinal contours are within normal limits. Both lungs are clear. The visualized skeletal structures are unremarkable. IMPRESSION: No active cardiopulmonary disease. Electronically Signed   By: Inez Catalina M.D.   On: 03/11/2016 15:34    1650:  Denies CP while  in the ED. Pt with multiple cardiac risk factors, will observation admit. Dx and testing d/w pt and family.  Questions answered.  Verb understanding, agreeable to admit.  T/C to [facility]. Lorin Mercy, case discussed, including:  HPI, pertinent PM/SHx, VS/PE, dx testing, ED course and treatment:  Agreeable to admit.  Final Clinical Impressions(s) / ED Diagnoses   Final diagnoses:  None  New Prescriptions New Prescriptions   No medications on file     Francine Graven, DO 03/14/16 1550

## 2016-03-11 NOTE — ED Notes (Signed)
Pt reports that the [employer] changed his lisinopril from 5 mg to 20 mg last week and since that time, he has had hypotension, dizziness and cp intermittently since then- today his bp was 90/47 and a relative who is a rn said to come to ED

## 2016-03-12 ENCOUNTER — Other Ambulatory Visit: Payer: Self-pay | Admitting: Adult Health

## 2016-03-12 DIAGNOSIS — I10 Essential (primary) hypertension: Secondary | ICD-10-CM | POA: Diagnosis not present

## 2016-03-12 DIAGNOSIS — I2510 Atherosclerotic heart disease of native coronary artery without angina pectoris: Secondary | ICD-10-CM | POA: Diagnosis not present

## 2016-03-12 DIAGNOSIS — E7800 Pure hypercholesterolemia, unspecified: Secondary | ICD-10-CM | POA: Diagnosis not present

## 2016-03-12 DIAGNOSIS — R079 Chest pain, unspecified: Secondary | ICD-10-CM

## 2016-03-12 DIAGNOSIS — Z955 Presence of coronary angioplasty implant and graft: Secondary | ICD-10-CM

## 2016-03-12 DIAGNOSIS — R0789 Other chest pain: Secondary | ICD-10-CM | POA: Diagnosis not present

## 2016-03-12 DIAGNOSIS — I252 Old myocardial infarction: Secondary | ICD-10-CM

## 2016-03-12 DIAGNOSIS — E119 Type 2 diabetes mellitus without complications: Secondary | ICD-10-CM | POA: Diagnosis not present

## 2016-03-12 LAB — GLUCOSE, CAPILLARY
GLUCOSE-CAPILLARY: 245 mg/dL — AB (ref 65–99)
Glucose-Capillary: 193 mg/dL — ABNORMAL HIGH (ref 65–99)

## 2016-03-12 LAB — TSH: TSH: 0.354 u[IU]/mL (ref 0.350–4.500)

## 2016-03-12 LAB — TROPONIN I

## 2016-03-12 MED ORDER — ISOSORBIDE MONONITRATE ER 30 MG PO TB24: 30.0000 mg | ORAL_TABLET | Freq: Every day | ORAL | 3 refills | Status: DC

## 2016-03-12 MED ORDER — LIVING WELL WITH DIABETES BOOK
Freq: Once | Status: AC
  Administered 2016-03-12: 10:00:00
  Filled 2016-03-12: qty 1

## 2016-03-12 MED ORDER — ISOSORBIDE MONONITRATE ER 60 MG PO TB24
30.0000 mg | ORAL_TABLET | Freq: Every day | ORAL | Status: DC
  Administered 2016-03-12: 30 mg via ORAL
  Filled 2016-03-12: qty 1

## 2016-03-12 MED ORDER — LISINOPRIL 10 MG PO TABS: 10.0000 mg | ORAL_TABLET | Freq: Every day | ORAL | 3 refills | Status: DC

## 2016-03-12 NOTE — Care Management Note (Signed)
Case Management Note  Patient Details  Name: Juan Ray MRN: [id number] Date of Birth: 18-Sep-1960  Subjective/Objective:   Chart reviewed for needs. Patient ind with ADL's. Has PCP,  Insurance with prescription coverage as of 01/02/2016. Had recently been without insurance and using $4 dollar list medications.            Action/Plan: Anticipate DC home with self care.    Expected Discharge Date:       03/12/2016           Expected Discharge Plan:     In-House Referral:     Discharge planning Services     Post Acute Care Choice:    Choice offered to:     DME Arranged:    DME Agency:     HH Arranged:    HH Agency:     Status of Service:     If discussed at [employer], dates discussed:    Additional Comments:  Pasha Broad, Chauncey [place], RN 03/12/2016, 11:53 AM

## 2016-03-12 NOTE — Consult Note (Signed)
CARDIOLOGY CONSULT NOTE   Patient ID: Juan Ray MRN: [id number] DOB/AGE: 1960-05-12 56 y.o.  Admit Date: 03/11/2016 Referring Physician: TRH-Hernandez Primary Physician: Odette Fraction, MD Consulting Cardiologist: Kate Sable MD Primary Cardiologist: Sherren Mocha MD Reason for Consultation: Chest Pain with known CAD  Clinical Summary Juan Ray is a 56 y.o.male with known history of CAD, DM2, HTN, HL, PUD, with NSTEMI in 2014, with DES to mCFx. He had residual disease which was treated medically. When last seen in the office in 02/2015 he had lost his insurance and had been off of his medications for 2 months. He was restarted on medications through [facility] pharmacy.   He is being followed by PCP and was seen last week. BP was elevated, lisinopril was increased from 5 mg to 20 mg. BG remained elevated and medications are being adjusted. He has felt weak and began having chest burning since going up on lisinopril dose. He states he has been compliant with his medications with the exception of PPI. Ran out of it and did not start taking again.   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 Lasted about 45 minutes, without radiations, or sequela of diaphoresis, dizziness, or dyspnea. Came to ER, but pain subsided by the time he got to ER. Pain is not similar to pain he felt prior to DES. States he had more severe chest pain and had radiation to his right arm.   He presented to ER with complaints of chest pain. BP 112/71, HR 96 bpm, O2 sat 98%. Pertinent labs: Troponin negative X 3. Glucose 285. EKG NSR with prior inferior infarct, unchanged. CXR negative for CHF or pneumonia. He was admitted to rule out ACS.   No Known Allergies  Medications Scheduled Medications: . aspirin EC  81 mg Oral Daily  .  atorvastatin  20 mg Oral q1800  . clopidogrel  75 mg Oral Daily  . enoxaparin (LOVENOX) injection  40 mg Subcutaneous Q24H  . famotidine  20 mg Oral BID  . gabapentin  600 mg Oral TID  . insulin aspart  0-15 Units Subcutaneous TID WC  . insulin aspart  0-5 Units Subcutaneous QHS  . lisinopril  10 mg Oral Daily  . metoprolol tartrate  12.5 mg Oral BID      PRN Medications:  acetaminophen, gi cocktail, morphine injection, ondansetron (ZOFRAN) IV, oxyCODONE-acetaminophen   Past Medical History:  Diagnosis Date  . CAD (coronary artery disease) 04/07/2012   Inferoposterior STEMI s/p DES-mid LCx  . Diabetes mellitus   . Diabetic neuropathy ([place])   . High cholesterol   . Hypertension   . Myocardial infarction   . Obesity   . Pancreatitis    a. mild by CT 06/2011  . Peptic ulcer disease    a. 06/2009 EGD: multiple gastric and duodenal ulcers.    Past Surgical History:  Procedure Laterality Date  . CORONARY ANGIOPLASTY WITH STENT PLACEMENT  04/07/2012   70% prox LAD, 70-80% ostial diagonal, 70% mid-distal LAD, 80% prox OM1, mid LCx totally occluded just distal to OM1 s/p DES, occluded RCA; severe inferior HK, LVEF 45%  . ESOPHAGOGASTRODUODENOSCOPY  06/18/2011   NL ESOPHAGUS/UlcerATED LESIONS/ SUPERFICIAL Ulcers  . LEFT HEART CATHETERIZATION WITH CORONARY ANGIOGRAM N/A 04/07/2012   Procedure: LEFT HEART CATHETERIZATION WITH CORONARY ANGIOGRAM;  Surgeon: Sherren Mocha, MD;  Location: [facility] C[facility] LAB;  Service: Cardiovascular;  Laterality: N/A;  . PERCUTANEOUS CORONARY STENT INTERVENTION (PCI-S)  04/07/2012   Procedure: PERCUTANEOUS CORONARY STENT INTERVENTION (PCI-S);  Surgeon: Sherren Mocha, MD;  Location: [facility] CATH LAB;  Service: Cardiovascular;;    Family History  Problem Relation Age of Onset  . Heart attack Father     MI x 2 in late 50's, currently 58's  . CVA Sister     late 40s  . CAD Maternal Uncle     MI at 82     Social History Juan Ray reports that he quit smoking about  18 years ago. He quit after 20.00 years of use. He quit smokeless tobacco use about 18 years ago. Juan Ray reports that he does not drink alcohol.  Review of Systems Complete review of systems are found to be negative unless outlined in H&P above.  Physical Examination Blood pressure (!) 141/91, pulse 72, temperature 98.1 F (36.7 C), temperature source Oral, resp. rate 18, height 5\' 8"  (1.727 m), weight 240 lb (108.9 kg), SpO2 99 %. No intake or output data in the 24 hours ending 03/12/16 0850  Telemetry: NSR no pauses or ectopy.   GEN: No acute distress.  HEENT: Conjunctiva and lids normal, oropharynx clear with moist mucosa. Neck: Supple, no elevated JVP or carotid bruits, no thyromegaly. Lungs: Clear to auscultation, nonlabored breathing at rest. Cardiac: Regular rate and rhythm, no S3 or significant systolic murmur, no pericardial rub. Abdomen: Soft, nontender, no hepatomegaly, bowel sounds present, no guarding or rebound. Obese. Extremities: No pitting edema, distal pulses 2+. Skin: Warm and dry. Musculoskeletal: No kyphosis. Neuropsychiatric: Alert and oriented x3, affect grossly appropriate.  Prior Cardiac Testing/Procedures  Cardiac Cath 04/07/2012 1. Acute inferoposterior myocardial infarction secondary to total occlusion left circumflex, treated successfully with primary PCI. 2. Three-vessel coronary artery disease with chronic total occlusion of the right coronary artery, acute occlusion of the left circumflex, and moderate to severe stenosis of the LAD 3. Moderate LV systolic dysfunction with an estimated left ventricular ejection fraction 45%   NM Stress Test 04/22/2012  Follow up myoview demonstrated inf-lat scar, no ischemia, EF 39%. Med Rx continued.  Echocardiogram 11/14/2012 Left ventricle: The cavity size was normal. Wall thickness was increased in a pattern of mild LVH. Systolic function was mildly to moderately reduced. The estimated  ejection fraction was in the range of 40% to 45%. There is akinesis of the inferior and posterior myocardium. Doppler parameters are consistent with abnormal left ventricular relaxation (grade 1 diastolic dysfunction). - Left atrium: The atrium was mildly dilated.  Lab Results  Basic Metabolic Panel:  Recent Labs Lab 03/11/16 1551  NA 137  K 4.0  CL 104  CO2 26  GLUCOSE 285*  BUN 23*  CREATININE 0.82  CALCIUM 9.2    CBC:  Recent Labs Lab 03/11/16 1551  WBC 9.2  HGB 15.4  HCT 42.4  MCV 91.4  PLT 264    Cardiac Enzymes:  Recent Labs Lab 03/11/16 1551 03/11/16 1939 03/12/16 0033 03/12/16 0623  TROPONINI <0.03 <0.03 <0.03 <0.03   Radiology: Dg Chest 2 View  Result Date: 03/11/2016 CLINICAL DATA:  Chest pain EXAM: CHEST  2 VIEW COMPARISON:  07/28/2015 FINDINGS: The heart size and mediastinal contours are within normal limits. Both lungs are clear. The visualized skeletal structures are unremarkable. IMPRESSION: No active cardiopulmonary disease. Electronically Signed   By: Inez Catalina M.D.   On: 03/11/2016 15:34     ECG: NSR with prior inferior infarct.  Impression and Recommendations  1. Recurrent chest pain: Features worrisome for unstable angina. 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 Described as burning, and not similar to prior MI pain. Pain subsided before he came to ER. He has had no recurrence of chest pain since admission. Troponin is negative X 3, without acute ST T wave changes, arguing against ACS.  Consider transfer to [facility] for diagnostic cath with hx of CAD and other CVRF, and recurrent pain.   2. CAD: Hx of DES to mid Cx in 2014. Residual disease per cardiac cath, treated medically at that time. Repeat NM study did not reveal areas of ischemia in 2014. Continue DAPT, with plavix and ASA. Lipitor but would increase the dose to 40 mg daily, continue ACE at 10 mg daily as he does not tolerate  higher dose of 20 mg daily with symptoms of weakness and hypotension.   3. Diabetes: Not well controlled on admission. Struggling with dietary restrictions. PCP managing.   4. PUD: Has been off PPI. Consider restarting.       Signed: Phill Myron. Lawrence NP Wright  03/12/2016, 8:50 AM Co-Sign MD  The patient was seen and examined, and I agree with the history, physical exam, assessment and plan as documented above, with modifications as noted below. 56 yr old male with aforementioned h/o STEMI and left circumflex stent admitted with dizziness, weakness, and chest pain. Lisinopril recently increased from 5 mg to 20 mg on 02/21/16 as BP was 154/98 at PCP's office (Dr. Dennard Schaumann). Since then, he has been feeling weak and dizzy. Has had intermittent restrosternal "chest burning" over the past month. Denies exertional chest tightness and dyspnea. Wife says he has felt more weak over past 1-2 months but patient gives a different story. Has not had to use nitroglycerin and denies any symptoms similar to 04/2012 when he had stent. ECG shows old inferior infarct. Troponins are normal.   Cath (04/07/12): Left mainstem: Widely patent without obstructive disease  Left anterior descending (LAD): The LAD is diffusely diseased. There is 70% stenosis of the proximal LAD. The diagonal branches arising from this segment are small with 70-80% ostial stenosis. The mid LAD is patent with minor nonobstructive disease. The junction of the mid and distal LAD there is another 70% stenosis present. The LAD wraps around the left ventricular apex.  Left circumflex (LCx): The left circumflex is large in caliber. The first obtuse marginal branch is medium in caliber. The first OM has an 80% proximal stenosis. The mid circumflex just beyond the OM is totally occluded. The second OM fills late.  Right coronary artery (RCA): The right coronary artery is occluded. The vessel is moderate in caliber. It occludes just after the  first RV branch. There is late antegrade filling as well as distal filling from left to right collaterals.   Plan: I had a lengthy discussion with the patient and his wife. He is not interested in coronary angiography and I am not certain it is necessarily warranted. Will decrease lisinopril to 10 mg and increase Lipitor to 40 mg. Continue ASA, Plavix, and metoprolol. I will arrange for outpatient nuclear stress testing (Lexiscan Myoview) to assess for hemodynamic significance of LAD lesions. I will also add long acting nitrates. Afterwards, I will arrange for close follow up with Dr. Burt Knack (his cardiologist) for further management (medical Rx v coronary angiography). The patient is agreeable with this plan.  Kate Sable, MD, [facility]  03/12/2016 9:56 AM

## 2016-03-12 NOTE — Discharge Summary (Signed)
Physician Discharge Summary  Juan Ray VOZ:[id number] DOB: 03/21/1960 DOA: 03/11/2016  PCP: Odette Fraction, MD  Admit date: 03/11/2016 Discharge date: 03/12/2016  Time spent: 45 minutes  Recommendations for Outpatient Follow-up:  -Will be discharged home today. -Cardiology will arrange for OP stress test and follow up.  Discharge Diagnoses:  Principal Problem:   Chest pain Active Problems:   HTN (hypertension)   DM (diabetes mellitus) ([place])   Hyperlipidemia   Discharge Condition: Stable and improved  Filed Weights   03/11/16 1512  Weight: 108.9 kg (240 lb)    History of present illness:  As per Dr. Lorin Mercy on 3/11: Juan Ray is a 56 y.o. male with medical history significant of DM, HTN, HLD, and h/o STEMI with stent placement in 4/14 presenting with chest pain this AM, acute onset about 11-12.  Was installing 2 lights in living room when it started, got worse after he finished.  Checked BP and it was 91/43, which is low for him.  Previously on 5 mg Lisinopril, but it was increased to 20 mg about 1-2 weeks ago.  Since then he has been having dizzy spells, feeling weak, little energy, wants to sleep all the time.  Intermittent chest pain since then too.    No associated symptoms.  Lasted 30-45 minutes.  Resolved spontaneously in route.Takes 81 mg ASA daily.  MI with stent in 2014, post-stress test 3 months later.   Hospital Course:   Chest Pain -Ruled out for ACS. -Seen by cardiology with plans for OP stress test.  Procedures:  None   Consultations:  Cardiology  Discharge Instructions  Discharge Instructions    Ambulatory referral to Nutrition and Diabetic Education    Complete by:  As directed    Lives in [place].Prefers [employer] location.   Diet - low sodium heart healthy    Complete by:  As directed    Increase activity slowly    Complete by:  As directed      Allergies as of 03/12/2016   No Known Allergies     Medication List      TAKE these medications   aspirin 81 MG EC tablet Take 1 tablet (81 mg total) by mouth daily.   clopidogrel 75 MG tablet Commonly known as:  PLAVIX TAKE ONE TABLET BY MOUTH ONCE DAILY   famotidine 20 MG tablet Commonly known as:  PEPCID TAKE ONE TABLET BY MOUTH TWICE DAILY   gabapentin 300 MG capsule Commonly known as:  NEURONTIN Take 1 capsule (300 mg total) by mouth 3 (three) times daily. What changed:  how much to take   glipiZIDE 10 MG tablet Commonly known as:  GLUCOTROL TAKE ONE TABLET BY MOUTH TWICE DAILY BEFORE A MEAL   isosorbide mononitrate 30 MG 24 hr tablet Commonly known as:  IMDUR Take 1 tablet (30 mg total) by mouth daily. Start taking on:  03/13/2016   lisinopril 10 MG tablet Commonly known as:  PRINIVIL,ZESTRIL Take 1 tablet (10 mg total) by mouth daily. Start taking on:  03/13/2016 What changed:  medication strength  how much to take   metFORMIN 1000 MG tablet Commonly known as:  GLUCOPHAGE Take 1 tablet (1,000 mg total) by mouth 2 (two) times daily.   metoprolol tartrate 25 MG tablet Commonly known as:  LOPRESSOR Take 0.5 tablets (12.5 mg total) by mouth 2 (two) times daily.   nitroGLYCERIN 0.4 MG SL tablet Commonly known as:  NITROSTAT Place 1 tablet (0.4 mg total) under the tongue every  5 (five) minutes x 3 doses as needed for chest pain.   oxyCODONE-acetaminophen 7.5-325 MG tablet Commonly known as:  PERCOCET Take 1 tablet by mouth every 4 (four) hours as needed for severe pain.   pravastatin 40 MG tablet Commonly known as:  PRAVACHOL Take 1 tablet (40 mg total) by mouth every evening.      No Known Allergies Follow-up Information    [facility] Follow up.   Why:  Report to radiology at 9:30 am. Nothing to eat or drink after midnight the night before stress test. Hold metoprolol that morning and the night before test.        Richardson Dopp, PA-C Follow up on 03/16/2016.   Specialties:  Cardiology, Physician Assistant Why:   9:30 am, with Advanced Practice Provider.  Contact information: [address] [place] [address] [phone]            The results of significant diagnostics from this hospitalization (including imaging, microbiology, ancillary and laboratory) are listed below for reference.    Significant Diagnostic Studies: Dg Chest 2 View  Result Date: 03/11/2016 CLINICAL DATA:  Chest pain EXAM: CHEST  2 VIEW COMPARISON:  07/28/2015 FINDINGS: The heart size and mediastinal contours are within normal limits. Both lungs are clear. The visualized skeletal structures are unremarkable. IMPRESSION: No active cardiopulmonary disease. Electronically Signed   By: Inez Catalina M.D.   On: 03/11/2016 15:34    Microbiology: No results found for this or any previous visit (from the past 240 hour(s)).   Labs: Basic Metabolic Panel:  Recent Labs Lab 03/11/16 1551  NA 137  K 4.0  CL 104  CO2 26  GLUCOSE 285*  BUN 23*  CREATININE 0.82  CALCIUM 9.2   Liver Function Tests: No results for input(s): AST, ALT, ALKPHOS, BILITOT, PROT, ALBUMIN in the last 168 hours. No results for input(s): LIPASE, AMYLASE in the last 168 hours. No results for input(s): AMMONIA in the last 168 hours. CBC:  Recent Labs Lab 03/11/16 1551  WBC 9.2  HGB 15.4  HCT 42.4  MCV 91.4  PLT 264   Cardiac Enzymes:  Recent Labs Lab 03/11/16 1551 03/11/16 1939 03/12/16 0033 03/12/16 0623  TROPONINI <0.03 <0.03 <0.03 <0.03   BNP: BNP (last 3 results) No results for input(s): BNP in the last 8760 hours.  ProBNP (last 3 results) No results for input(s): PROBNP in the last 8760 hours.  CBG:  Recent Labs Lab 03/11/16 2210 03/12/16 0726 03/12/16 1105  GLUCAP 264* 245* 193*       Signed:  HERNANDEZ ACOSTA,ESTELA  [facility] Pager: [phone] 03/12/2016, 1:10 PM

## 2016-03-12 NOTE — Progress Notes (Signed)
Discharge instructions given on medications,and follow up visits,patient verbalized understanding.Prescription sent to Pharmacy of choice documented in AVS. Vital signs stable. Staff accompanied patient to an awaiting vehicle.

## 2016-03-12 NOTE — Progress Notes (Signed)
Inpatient Diabetes Program Recommendations  AACE/ADA: New Consensus Statement on Inpatient Glycemic Control (2015)  Target Ranges:  Prepandial:   less than 140 mg/dL      Peak postprandial:   less than 180 mg/dL (1-2 hours)      Critically ill patients:  140 - 180 mg/dL   Lab Results  Component Value Date   GLUCAP 245 (H) 03/12/2016   HGBA1C 10.2 (H) 01/27/2016    Review of Glycemic Control Results for BRAUN, ROCCA (MRN [id number]) as of 03/12/2016 08:51  Ref. Range 03/11/2016 22:10 03/12/2016 07:26  Glucose-Capillary Latest Ref Range: 65 - 99 mg/dL 264 (H) 245 (H)   Diabetes history: DM2 Outpatient Diabetes medications: Glucotrol 10 mg bid + Metformin 1 gm bid Current orders for Inpatient glycemic control: Novolog correction 0-15 units tid + 0-5 units hs  Inpatient Diabetes Program Recommendations:  Spoke with patient by phone to discuss current DM management. Patient knew his A1c of 10.2 on 01/27/16 and working with PCP to decrease blood glucose. Patient states he has lost approx. 40 lbs over the last 6 months and has eliminated sugar from his drinks and decreased carbohydrates in his diet. Currently has insurance so is taking medications as prescribed. Willing to attend outpatient diabetes education classes in community. While oral medications held, please consider Lantus 20 units ( 0.2 units X 108.9 kg = 21.78 units).  Thank you, Nani Gasser. Kayla Weekes, RN, MSN, CDE Inpatient Glycemic Control Team Team Pager [phone] (8am-5pm) 03/12/2016 9:01 AM

## 2016-03-13 LAB — HIV ANTIBODY (ROUTINE TESTING W REFLEX): HIV SCREEN 4TH GENERATION: NONREACTIVE

## 2016-03-15 ENCOUNTER — Ambulatory Visit (INDEPENDENT_AMBULATORY_CARE_PROVIDER_SITE_OTHER): Payer: BLUE CROSS/BLUE SHIELD | Admitting: Family Medicine

## 2016-03-15 ENCOUNTER — Encounter: Payer: Self-pay | Admitting: Family Medicine

## 2016-03-15 ENCOUNTER — Other Ambulatory Visit: Payer: Self-pay | Admitting: *Deleted

## 2016-03-15 VITALS — BP 138/84 | HR 90 | Temp 98.6°F | Resp 14 | Ht 69.0 in | Wt 238.0 lb

## 2016-03-15 DIAGNOSIS — E1141 Type 2 diabetes mellitus with diabetic mononeuropathy: Secondary | ICD-10-CM | POA: Diagnosis not present

## 2016-03-15 DIAGNOSIS — I2510 Atherosclerotic heart disease of native coronary artery without angina pectoris: Secondary | ICD-10-CM | POA: Diagnosis not present

## 2016-03-15 DIAGNOSIS — E1100 Type 2 diabetes mellitus with hyperosmolarity without nonketotic hyperglycemic-hyperosmolar coma (NKHHC): Secondary | ICD-10-CM | POA: Diagnosis not present

## 2016-03-15 DIAGNOSIS — I10 Essential (primary) hypertension: Secondary | ICD-10-CM

## 2016-03-15 DIAGNOSIS — Z09 Encounter for follow-up examination after completed treatment for conditions other than malignant neoplasm: Secondary | ICD-10-CM | POA: Diagnosis not present

## 2016-03-15 DIAGNOSIS — F339 Major depressive disorder, recurrent, unspecified: Secondary | ICD-10-CM | POA: Diagnosis not present

## 2016-03-15 DIAGNOSIS — R072 Precordial pain: Secondary | ICD-10-CM

## 2016-03-15 NOTE — Progress Notes (Signed)
Subjective:    Patient ID: Juan Ray, male    DOB: Dec 20, 1960, 56 y.o.   MRN: [id number]  HPI   02/07/16 Patient has 2 problems.  First he reports a burning pins and needles pain in both feet. He states that his feet feel like they're on fire. He keeps him awake at night. The pain can be severe. He has a difficult time sleeping. He has a history of uncontrolled diabetes mellitus. Second issue is itching all over his body. He has been attributing this to the diabetes however his son recently came to visit him and exposed him to scabies. He has been treated once but he continues to itch on his torso and on his trunk. He is clawing at his skin with sticks and a back scratcher as it is itching so.  At that time, my plan was:  I'll treat his diabetic neuropathy in his feet and legs with gabapentin 300 mg by mouth every 8 hours when necessary pain. I cautioned the patient about sedation and dizziness. However I believe the itching in his trunk and on his back may be residual scabies infestation. I recommended trying a repeat course of treatment with Elimite cream. He is to apply this head to toe and rinse off after 8 hours and repeat in 1 week. Also recommended washing all his bed close, etc. to avoid reinfection.  02/21/16 This taking gabapentin 300 mg 3 times a day, the patient states that the pins and needles sensation in his feet is approximately 40% better. The pain in his legs has improved but is still keeps him awake at night and causes him pain throughout the day. He is interested in other options to help manage the pain. He also believes he may have broken a rib on the left side. He felt a pop while pressing against his chest at work. He is nontender to palpation over the rib just below his left nipple. His blood pressure today is also elevated. This is the second time his blood pressure is been elevated. He is on lisinopril 2.5 mg a day.  At that time, my plan was: Patient states that his blood  sugar now down to 150. I'm concerned by his elevated blood pressure. I will increase his lisinopril to 20 mg a day and recheck his blood pressure in one month. I gave the patient the option of increasing gabapentin to 600 mg 3 times a day versus switching gabapentin to Lyrica 100 mg by mouth 3 times a day. He would like to try the Lyrica first. Also gave him Percocet 7.5/325 but I recommended one half tablet every 6 hours as needed for rib pain. I also cautioned him against taking the pain medication with the Lyrica until he sees how his body adjusts to the Lyrica  03/15/16 Was recently admitted to hospital with Chest pain.  I have copied relevant portions of the discharge summary and included them below for my reference:  Admit date: 03/11/2016 Discharge date: 03/12/2016  Time spent: 45 minutes  Recommendations for Outpatient Follow-up:  -Will be discharged home today. -Cardiology will arrange for OP stress test and follow up.  Discharge Diagnoses:  Principal Problem:   Chest pain Active Problems:   HTN (hypertension)   DM (diabetes mellitus) ([place])   Hyperlipidemia  History of present illness:  As per Dr. Lorin Mercy on 3/11[name] a 56 y.o.malewith medical history significant of DM, HTN, HLD, and h/o STEMI with stent placement in 4/14 presenting with  chest pain this AM, acute onset about 11-12. Was installing 2 lights in living room when it started, got worse after he finished. Checked BP and it was 91/43, which is low for him. Previously on 5 mg Lisinopril, but it was increased to 20 mg about 1-2 weeks ago. Since then he has been having dizzy spells, feeling weak, little energy, wants to sleep all the time. Intermittent chest pain since then too.   No associated symptoms. Lasted 30-45 minutes. Resolved spontaneously in route.Takes 81 mg ASA daily. MI with stent in 2014, post-stress test 3 months later.   Hospital Course:   Chest Pain -Ruled out for ACS. -Seen  by cardiology with plans for OP stress test.------------------------- Medication List    TAKE these medications   aspirin 81 MG EC tablet Take 1 tablet (81 mg total) by mouth daily.   clopidogrel 75 MG tablet Commonly known as:  PLAVIX TAKE ONE TABLET BY MOUTH ONCE DAILY   famotidine 20 MG tablet Commonly known as:  PEPCID TAKE ONE TABLET BY MOUTH TWICE DAILY   gabapentin 300 MG capsule Commonly known as:  NEURONTIN Take 1 capsule (300 mg total) by mouth 3 (three) times daily. What changed:  how much to take   glipiZIDE 10 MG tablet Commonly known as:  GLUCOTROL TAKE ONE TABLET BY MOUTH TWICE DAILY BEFORE A MEAL   isosorbide mononitrate 30 MG 24 hr tablet Commonly known as:  IMDUR Take 1 tablet (30 mg total) by mouth daily. Start taking on:  03/13/2016   lisinopril 10 MG tablet Commonly known as:  PRINIVIL,ZESTRIL Take 1 tablet (10 mg total) by mouth daily. Start taking on:  03/13/2016 What changed:  medication strength  how much to take   metFORMIN 1000 MG tablet Commonly known as:  GLUCOPHAGE Take 1 tablet (1,000 mg total) by mouth 2 (two) times daily.   metoprolol tartrate 25 MG tablet Commonly known as:  LOPRESSOR Take 0.5 tablets (12.5 mg total) by mouth 2 (two) times daily.   nitroGLYCERIN 0.4 MG SL tablet Commonly known as:  NITROSTAT Place 1 tablet (0.4 mg total) under the tongue every 5 (five) minutes x 3 doses as needed for chest pain.   oxyCODONE-acetaminophen 7.5-325 MG tablet Commonly known as:  PERCOCET Take 1 tablet by mouth every 4 (four) hours as needed for severe pain.   pravastatin 40 MG tablet Commonly known as:  PRAVACHOL Take 1 tablet (40 mg total) by mouth every evening.    They decreased his lisinopril from 20 to 10 and he then decreased it independently to 5 mg a day since he has been home.  On the lower dose of lisinopril, his blood pressure is still averaging between 979 and 892 systolic. He has not been checking his  sugars. He is due for his next hemoglobin A1c after April 26. His sugars in the hospital elevated however he states that he had not taken any of his medication today. His biggest concern is pain all over his body. The neuropathic pain in his legs has improved dramatically on gabapentin which she is taking 600 mg 3 times a day. However he reports diffuse widespread muscle pains in his arms in his chest and even in his abdomen. He states that he feels sore all over to the point that he doesn't want to touch his skin. He has had a previous reaction to statins in the past similar to this. He also reports depression. Financially he is in bankruptcy. His wife and he had  temporarily separated. He is under tremendous stress. He reports mood swings, anger control issues, depression, social avoidance, and anhedonia Past Medical History:  Diagnosis Date  . CAD (coronary artery disease) 04/07/2012   Inferoposterior STEMI s/p DES-mid LCx  . Diabetes mellitus   . Diabetic neuropathy ([place])   . High cholesterol   . Hypertension   . Myocardial infarction   . Obesity   . Pancreatitis    a. mild by CT 06/2011  . Peptic ulcer disease    a. 06/2009 EGD: multiple gastric and duodenal ulcers.   Past Surgical History:  Procedure Laterality Date  . CORONARY ANGIOPLASTY WITH STENT PLACEMENT  04/07/2012   70% prox LAD, 70-80% ostial diagonal, 70% mid-distal LAD, 80% prox OM1, mid LCx totally occluded just distal to OM1 s/p DES, occluded RCA; severe inferior HK, LVEF 45%  . ESOPHAGOGASTRODUODENOSCOPY  06/18/2011   NL ESOPHAGUS/UlcerATED LESIONS/ SUPERFICIAL Ulcers  . LEFT HEART CATHETERIZATION WITH CORONARY ANGIOGRAM N/A 04/07/2012   Procedure: LEFT HEART CATHETERIZATION WITH CORONARY ANGIOGRAM;  Surgeon: Sherren Mocha, MD;  Location[facility] CATH LAB;  Service: Cardiovascular;  Laterality: N/A;  . PERCUTANEOUS CORONARY STENT INTERVENTION (PCI-S)  04/07/2012   Procedure: PERCUTANEOUS CORONARY STENT INTERVENTION (PCI-S);  Surgeon:  Sherren Mocha, MD;  Location: [facility] CATH LAB;  Service: Cardiovascular;;   Current Outpatient Prescriptions on File Prior to Visit  Medication Sig Dispense Refill  . aspirin EC 81 MG EC tablet Take 1 tablet (81 mg total) by mouth daily.    . clopidogrel (PLAVIX) 75 MG tablet TAKE ONE TABLET BY MOUTH ONCE DAILY 30 tablet 3  . famotidine (PEPCID) 20 MG tablet TAKE ONE TABLET BY MOUTH TWICE DAILY 60 tablet 0  . gabapentin (NEURONTIN) 300 MG capsule Take 1 capsule (300 mg total) by mouth 3 (three) times daily. (Patient taking differently: Take 600 mg by mouth 3 (three) times daily. ) 90 capsule 3  . glipiZIDE (GLUCOTROL) 10 MG tablet TAKE ONE TABLET BY MOUTH TWICE DAILY BEFORE A MEAL 60 tablet 3  . isosorbide mononitrate (IMDUR) 30 MG 24 hr tablet Take 1 tablet (30 mg total) by mouth daily. 30 tablet 3  . lisinopril (PRINIVIL,ZESTRIL) 10 MG tablet Take 1 tablet (10 mg total) by mouth daily. (Patient taking differently: Take 5 mg by mouth daily. ) 30 tablet 3  . metFORMIN (GLUCOPHAGE) 1000 MG tablet Take 1 tablet (1,000 mg total) by mouth 2 (two) times daily. 60 tablet 3  . metoprolol tartrate (LOPRESSOR) 25 MG tablet Take 0.5 tablets (12.5 mg total) by mouth 2 (two) times daily. 30 tablet 3  . oxyCODONE-acetaminophen (PERCOCET) 7.5-325 MG tablet Take 1 tablet by mouth every 4 (four) hours as needed for severe pain. 30 tablet 0  . pravastatin (PRAVACHOL) 40 MG tablet Take 1 tablet (40 mg total) by mouth every evening. 30 tablet 3  . nitroGLYCERIN (NITROSTAT) 0.4 MG SL tablet Place 1 tablet (0.4 mg total) under the tongue every 5 (five) minutes x 3 doses as needed for chest pain. (Patient not taking: Reported on 03/15/2016) 25 tablet 3   No current facility-administered medications on file prior to visit.    No Known Allergies Social History   Social History  . Marital status: Married    Spouse name: N/A  . Number of children: N/A  . Years of education: N/A   Occupational History  . truck driver     Social History Main Topics  . Smoking status: Former Smoker    Years: 20.00    Quit  date: 2000  . Smokeless tobacco: Former Systems developer    Quit date: 01/01/1998  . Alcohol use No  . Drug use: No  . Sexual activity: Yes   Other Topics Concern  . Not on file   Social History Narrative   Lives in [place], with his wife and 81 yr old son.      Review of Systems  All other systems reviewed and are negative.      Objective:   Physical Exam  Constitutional: He appears well-developed and well-nourished.  Neck: Neck supple.  Cardiovascular: Normal rate, regular rhythm, normal heart sounds and intact distal pulses.   Pulmonary/Chest: Effort normal and breath sounds normal. No respiratory distress. He has no wheezes. He has no rales. He exhibits tenderness.  Abdominal: Soft. Bowel sounds are normal. He exhibits no distension. There is no tenderness. There is no rebound.  Musculoskeletal: He exhibits tenderness.       Right shoulder: He exhibits tenderness.       Left shoulder: He exhibits tenderness.       Right elbow: Tenderness found.       Left elbow: Tenderness found.       Right upper arm: He exhibits tenderness.       Left upper arm: He exhibits tenderness.  Lymphadenopathy:    He has no cervical adenopathy.  Skin: No rash noted. No erythema.  Vitals reviewed.         Assessment & Plan:  Uncontrolled type 2 diabetes mellitus with hyperosmolarity without coma, without long-term current use of insulin [facility])  Hospital discharge follow-up  Essential hypertension  ASCVD (arteriosclerotic cardiovascular disease) Blood pressure is acceptable even on the lower dose of lisinopril. I will start the patient on Lexapro 10 mg a day for depression and mood swings and reassess in 4 weeks. Continue gabapentin for neuropathy. However I believe the pain that he is experiencing the muscles all over his body is likely not neuropathy is much as it could be statin induced myopathy. I will  have the patient temporarily discontinue pravastatin and call me back the first of next week and let me know if the pain is improving. If it is improving, we may need to consider praluent/zetia/livalo.  However with each of these options cost would be a certain concern

## 2016-03-16 ENCOUNTER — Inpatient Hospital Stay (HOSPITAL_COMMUNITY): Admit: 2016-03-16 | Payer: BLUE CROSS/BLUE SHIELD

## 2016-03-16 ENCOUNTER — Encounter (HOSPITAL_COMMUNITY)
Admission: RE | Admit: 2016-03-16 | Discharge: 2016-03-16 | Disposition: A | Discharge status: Home / Self Care | Payer: BLUE CROSS/BLUE SHIELD | Source: Ambulatory Visit | Attending: Cardiovascular Disease | Admitting: Cardiovascular Disease

## 2016-03-16 ENCOUNTER — Encounter (HOSPITAL_COMMUNITY): Payer: Self-pay

## 2016-03-16 DIAGNOSIS — R072 Precordial pain: Secondary | ICD-10-CM | POA: Diagnosis not present

## 2016-03-16 LAB — NM MYOCAR MULTI W/SPECT W/WALL MOTION / EF
CHL CUP NUCLEAR SDS: 0
CHL CUP RESTING HR STRESS: 83 {beats}/min
LHR: 0.38
LV dias vol: 138 mL (ref 62–150)
LVSYSVOL: 106 mL
NUC STRESS TID: 0.83
Peak HR: 112 {beats}/min
SRS: 21
SSS: 21

## 2016-03-16 MED ORDER — REGADENOSON 0.4 MG/5ML IV SOLN
INTRAVENOUS | Status: AC
  Administered 2016-03-16: 0.4 mg via INTRAVENOUS
  Filled 2016-03-16: qty 5

## 2016-03-16 MED ORDER — TECHNETIUM TC 99M TETROFOSMIN IV KIT
10.0000 | PACK | Freq: Once | INTRAVENOUS | Status: AC | PRN
  Administered 2016-03-16: 10 via INTRAVENOUS

## 2016-03-16 MED ORDER — SODIUM CHLORIDE 0.9% FLUSH
INTRAVENOUS | Status: AC
  Administered 2016-03-16: 10 mL via INTRAVENOUS
  Filled 2016-03-16: qty 10

## 2016-03-16 MED ORDER — TECHNETIUM TC 99M TETROFOSMIN IV KIT
30.0000 | PACK | Freq: Once | INTRAVENOUS | Status: AC | PRN
  Administered 2016-03-16: 30 via INTRAVENOUS

## 2016-03-19 NOTE — Telephone Encounter (Signed)
I triaged pt on 03/07/2016 bu the he has since been seen at the ED for Chest pain. I had tentatively put him on schedule for 03/29/2016 but am taking off til he calls to schedule OV appt first after being cleared from cardiology. I tried to call and could not leave a message. Letter mailed.

## 2016-03-20 ENCOUNTER — Encounter: Payer: Self-pay | Admitting: Physician Assistant

## 2016-03-26 ENCOUNTER — Telehealth: Payer: Self-pay | Admitting: Family Medicine

## 2016-03-26 MED ORDER — BLOOD GLUCOSE MONITOR KIT: PACK | 0 refills | Status: DC

## 2016-03-26 NOTE — Progress Notes (Deleted)
Cardiology Office Note    Date:  03/26/2016   ID:  Juan Ray, DOB [birth year], MRN [id number]  PCP:  Odette Fraction, MD  Cardiologist: Dr. Burt Knack  Chief Complaint: Hospital follow up for chest pain and abnormal stress test  History of Present Illness:   Juan Ray is a 56 y.o. male CAD, DM2, HTN, HL, PUD presents to discuss abnormal myoview.   He suffered an inf-post STEMI 04/2012 treated with Promus DES to [facility]. Residual disease treated medically. Last seen by Dr. Burt Knack 11/2012. When seen in office 02/2015 he had lost his insurance and had been off of his medications for 2 months. He was restarted on medications through [facility] $4 pharmacy.   Admitted 3/12-3/13 for recurrent chest pain.  Features worrisome for unstable angina. 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 Chest pain resolved before came to ER. Marland KitchenTroponin is negative X 3, without acute ST T wave changes, arguing against ACS. Recommended outpatient stress test which showed:    There was no ST segment deviation noted during stress.  Findings consistent with prior large inferior and inferolateral myocardial infarction. There is a small anteriorinfarct with mild peri-infarct ischemia.  This is a high risk study. High risk based on large scar and decreased LVEF. Fairly mild area of myocardium currently at jeopardy in the anterior wall.  The left ventricular ejection fraction is severely decreased (<30%).  Here today for further discussion.    Cath and echo - discussed with Dr. Burt Knack Past Medical History:  Diagnosis Date  . CAD (coronary artery disease) 04/07/2012   Inferoposterior STEMI s/p DES-mid LCx  . Diabetes mellitus   . Diabetic neuropathy ([place])   . High cholesterol   . Hypertension   . Myocardial infarction   . Obesity   . Pancreatitis    a. mild by CT 06/2011  . Peptic ulcer disease    a. 06/2009 EGD: multiple gastric and duodenal ulcers.    Past  Surgical History:  Procedure Laterality Date  . CORONARY ANGIOPLASTY WITH STENT PLACEMENT  04/07/2012   70% prox LAD, 70-80% ostial diagonal, 70% mid-distal LAD, 80% prox OM1, mid LCx totally occluded just distal to OM1 s/p DES, occluded RCA; severe inferior HK, LVEF 45%  . ESOPHAGOGASTRODUODENOSCOPY  06/18/2011   NL ESOPHAGUS/UlcerATED LESIONS/ SUPERFICIAL Ulcers  . LEFT HEART CATHETERIZATION WITH CORONARY ANGIOGRAM N/A 04/07/2012   Procedure: LEFT HEART CATHETERIZATION WITH CORONARY ANGIOGRAM;  Surgeon: Sherren Mocha, MD;  Location: [facility] CATH LAB;  Service: Cardiovascular;  Laterality: N/A;  . PERCUTANEOUS CORONARY STENT INTERVENTION (PCI-S)  04/07/2012   Procedure: PERCUTANEOUS CORONARY STENT INTERVENTION (PCI-S);  Surgeon: Sherren Mocha, MD;  Location: [facility] CATH LAB;  Service: Cardiovascular;;    Current Medications: Prior to Admission medications   Medication Sig Start Date End Date Taking? Authorizing Provider  aspirin EC 81 MG EC tablet Take 1 tablet (81 mg total) by mouth daily. 04/09/12   Roger A Arguello, PA-C  clopidogrel (PLAVIX) 75 MG tablet TAKE ONE TABLET BY MOUTH ONCE DAILY 02/02/16   Susy Frizzle, MD  famotidine (PEPCID) 20 MG tablet TAKE ONE TABLET BY MOUTH TWICE DAILY 02/10/16   Liliane Shi, PA-C  gabapentin (NEURONTIN) 300 MG capsule Take 1 capsule (300 mg total) by mouth 3 (three) times daily. Patient taking differently: Take 600 mg by mouth 3 (three) times daily.  03/06/16   Susy Frizzle, MD  glipiZIDE (GLUCOTROL) 10 MG tablet TAKE ONE TABLET BY MOUTH TWICE DAILY  BEFORE A MEAL 02/02/16   Susy Frizzle, MD  isosorbide mononitrate (IMDUR) 30 MG 24 hr tablet Take 1 tablet (30 mg total) by mouth daily. 03/13/16   Erline Hau, MD  lisinopril (PRINIVIL,ZESTRIL) 10 MG tablet Take 1 tablet (10 mg total) by mouth daily. Patient taking differently: Take 5 mg by mouth daily.  03/13/16   Erline Hau, MD  metFORMIN (GLUCOPHAGE) 1000 MG tablet Take 1 tablet (1,000  mg total) by mouth 2 (two) times daily. 02/02/16   Susy Frizzle, MD  metoprolol tartrate (LOPRESSOR) 25 MG tablet Take 0.5 tablets (12.5 mg total) by mouth 2 (two) times daily. 02/02/16   Susy Frizzle, MD  nitroGLYCERIN (NITROSTAT) 0.4 MG SL tablet Place 1 tablet (0.4 mg total) under the tongue every 5 (five) minutes x 3 doses as needed for chest pain. Patient not taking: Reported on 03/15/2016 02/07/15   Liliane Shi, PA-C  oxyCODONE-acetaminophen (PERCOCET) 7.5-325 MG tablet Take 1 tablet by mouth every 4 (four) hours as needed for severe pain. 02/21/16   Susy Frizzle, MD  pravastatin (PRAVACHOL) 40 MG tablet Take 1 tablet (40 mg total) by mouth every evening. 02/02/16   Susy Frizzle, MD    Allergies:   Patient has no known allergies.   Social History   Social History  . Marital status: Married    Spouse name: N/A  . Number of children: N/A  . Years of education: N/A   Occupational History  . truck driver    Social History Main Topics  . Smoking status: Former Smoker    Years: 20.00    Quit date: 2000  . Smokeless tobacco: Former Systems developer    Quit date: 01/01/1998  . Alcohol use No  . Drug use: No  . Sexual activity: Yes   Other Topics Concern  . Not on file   Social History Narrative   Lives in [place], with his wife and 26 yr old son.     Family History:  The patient's family history includes CAD in his maternal uncle; CVA in his sister; Heart attack in his father. ***  ROS:   Please see the history of present illness.    ROS All other systems reviewed and are negative.   PHYSICAL EXAM:   VS:  There were no vitals taken for this visit.   GEN: Well nourished, well developed, in no acute distress  HEENT: normal  Neck: no JVD, carotid bruits, or masses Cardiac: ***RRR; no murmurs, rubs, or gallops,no edema  Respiratory:  clear to auscultation bilaterally, normal work of breathing GI: soft, nontender, nondistended, + BS MS: no deformity or atrophy  Skin: warm  and dry, no rash Neuro:  Alert and Oriented x 3, Strength and sensation are intact Psych: euthymic mood, full affect  Wt Readings from Last 3 Encounters:  03/15/16 238 lb (108 kg)  03/11/16 240 lb (108.9 kg)  02/21/16 240 lb (108.9 kg)      Studies/Labs Reviewed:   EKG:  EKG is ordered today.  The ekg ordered today demonstrates ***  Recent Labs: 01/27/2016: ALT 30 03/11/2016: BUN 23; Creatinine, Ser 0.82; Hemoglobin 15.4; Platelets 264; Potassium 4.0; Sodium 137; TSH 0.354   Lipid Panel    Component Value Date/Time   CHOL 139 01/27/2016 1211   TRIG 182 (H) 01/27/2016 1211   HDL 25 (L) 01/27/2016 1211   CHOLHDL 5.6 (H) 01/27/2016 1211   VLDL 36 (H) 01/27/2016 1211   LDLCALC 78  01/27/2016 1211    Additional studies/ records that were reviewed today include:   Echocardiogram: 11/2012 Study Conclusions  - Left ventricle: The cavity size was normal. Wall thickness was increased in a pattern of mild LVH. Systolic function was mildly to moderately reduced. The estimated ejection fraction was in the range of 40% to 45%. There is akinesis of the inferior and posterior myocardium. Doppler parameters are consistent with abnormal left ventricular relaxation (grade 1 diastolic dysfunction). - Left atrium: The atrium was mildly dilated.  Cardiac Catheterization:  04/2012 Coronary angiography: Coronary dominance: right  Left mainstem: Widely patent without obstructive disease  Left anterior descending (LAD): The LAD is diffusely diseased. There is 70% stenosis of the proximal LAD. The diagonal branches arising from this segment are small with 70-80% ostial stenosis. The mid LAD is patent with minor nonobstructive disease. The junction of the mid and distal LAD there is another 70% stenosis present. The LAD wraps around the left ventricular apex.  Left circumflex (LCx): The left circumflex is large in caliber. The first obtuse marginal branch is medium in caliber. The  first OM has an 80% proximal stenosis. The mid circumflex just beyond the OM is totally occluded. The second OM fills late.  Right coronary artery (RCA): The right coronary artery is occluded. The vessel is moderate in caliber. It occludes just after the first RV branch. There is late antegrade filling as well as distal filling from left to right collaterals.  Left ventriculography: There is severe hypokinesis of the inferior wall. The left ventricular ejection fraction is estimated at 45%. There is no significant mitral regurgitation.  PCI Note:  Following the diagnostic procedure, the decision was made to proceed with PCI. The patient was loaded with brilinta 180 mg. He had already received unfractionated heparin. Weight-based bivalirudin was given for anticoagulation. Once a therapeutic ACT was achieved, a 6 [place] XB LAD guide catheter was inserted.  A prolonged coronary guidewire was used to cross the lesion.  The lesion was predilated with a 2.5 x 12 mm balloon.  The lesion was then stented with a 4.0 x 24 mm proneness elements drug-eluting stent.  The stent was postdilated with a 4.0 x 20 mm noncompliant balloon.  Following PCI, there was 0% residual stenosis and TIMI-3 flow. Final angiography confirmed an excellent result. The patient tolerated the procedure well. There were no immediate procedural complications. A TR band was used for radial hemostasis. The patient was transferred to the post catheterization recovery area for further monitoring.  PCI Data: Vessel - left circumflex/Segment - mid Percent Stenosis (pre)  100 TIMI-flow 0 Stent 4.0 x 24 mm drug-eluting Percent Stenosis (post) 0 TIMI-flow (post) 3  Final Conclusions:   1. Acute inferoposterior myocardial infarction secondary to total occlusion left circumflex, treated successfully with primary PCI. 2. Three-vessel coronary artery disease with chronic total occlusion of the right coronary artery, acute occlusion of the left  circumflex, and moderate to severe stenosis of the LAD 3. Moderate LV systolic dysfunction with an estimated left ventricular ejection fraction 45%   Recommendations:  Aggressive medical therapy and risk reduction. Depending on the patient's post MI symptoms, consider outpatient stress testing once he has recovered from his MI for further risk stratification. He will need dual antiplatelet therapy with aspirin and brilinta for at least 12 months if tolerated.   ASSESSMENT & PLAN:    1. Unstable angina - Myoview  is a high risk study. High risk based on large scar and decreased LVEF. Fairly  mild area of myocardium currently at jeopardy in the anterior wall.  2. CAD s/p DES to [place] in 2014 - Residual disease (as noted above) treated medically.    Medication Adjustments/Labs and Tests Ordered: Current medicines are reviewed at length with the patient today.  Concerns regarding medicines are outlined above.  Medication changes, Labs and Tests ordered today are listed in the Patient Instructions below. There are no Patient Instructions on file for this visit.   Jarrett Soho, [place]  03/26/2016 2:56 PM    [facility] [address], [place], [place]  [address] Phone: [phone]; Fax: [phone]

## 2016-03-26 NOTE — Telephone Encounter (Signed)
Patient would like a new glucometer  along with test strips called into [facility] in [place].  CB# [phone]

## 2016-03-26 NOTE — Telephone Encounter (Signed)
Rx Faxed to [facility]

## 2016-03-29 ENCOUNTER — Ambulatory Visit: Payer: BLUE CROSS/BLUE SHIELD | Admitting: Physician Assistant

## 2016-04-06 NOTE — Progress Notes (Signed)
Cardiology Office Note    Date:  04/09/2016   ID:  Juan Ray, DOB 1960/05/13, MRN [id number]  PCP:  Odette Fraction, MD  Cardiologist:  Dr. Burt Knack  Chief Complaint: Hospital follow up for chest pain and abnormal stress test  History of Present Illness:   Juan Ray is a 56 y.o. male CAD, DM2, HTN, HL, PUD presents to discuss abnormal myoview.   He suffered an inf-post STEMI 04/2012 treated with Promus DES to [facility]. Residual disease treated medically. Last seen by Dr. Burt Knack 11/2012. When seen in office 02/2015 he had lost his insurance and had been off of his medications for 2 months. He was restarted on medications through [facility] $4 pharmacy.   Admitted 3/12-3/13 for recurrent chest pain. Features worrisome for unstable angina. 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 Chest pain resolved before came to ER. Marland KitchenTroponin is negative X 3, without acute ST T wave changes, arguing against ACS. Recommended outpatient stress test which showed:    There was no ST segment deviation noted during stress.  Findings consistent with prior large inferior and inferolateral myocardial infarction. There is a small anteriorinfarct with mild peri-infarct ischemia.  This is a high risk study. High risk based on large scar and decreased LVEF. Fairly mild area of myocardium currently at jeopardy in the anterior wall.  The left ventricular ejection fraction is severely decreased (<30%).  Here today for further discussion. He continues to have "constant dull achy chest pain" all across his chest. Intermittent dyspnea and LE edema. Denies orthopnea, PND, syncope. BP runs in 150-170-80-100s at home. Hx of gastric ulcer. Does not take any medications for this. His symptoms has been stable for the past one months. Stopped Imdur due to hypotension and dizziness prior to last admission.    Past Medical History:  Diagnosis Date  . CAD (coronary artery  disease) 04/07/2012   Inferoposterior STEMI s/p DES-mid LCx  . Diabetes mellitus   . Diabetic neuropathy ([place])   . High cholesterol   . Hypertension   . Myocardial infarction   . Obesity   . Pancreatitis    a. mild by CT 06/2011  . Peptic ulcer disease    a. 06/2009 EGD: multiple gastric and duodenal ulcers.    Past Surgical History:  Procedure Laterality Date  . CORONARY ANGIOPLASTY WITH STENT PLACEMENT  04/07/2012   70% prox LAD, 70-80% ostial diagonal, 70% mid-distal LAD, 80% prox OM1, mid LCx totally occluded just distal to OM1 s/p DES, occluded RCA; severe inferior HK, LVEF 45%  . ESOPHAGOGASTRODUODENOSCOPY  06/18/2011   NL ESOPHAGUS/UlcerATED LESIONS/ SUPERFICIAL Ulcers  . LEFT HEART CATHETERIZATION WITH CORONARY ANGIOGRAM N/A 04/07/2012   Procedure: LEFT HEART CATHETERIZATION WITH CORONARY ANGIOGRAM;  Surgeon: Sherren Mocha, MD;  Location: [facility] C[facility] LAB;  Service: Cardiovascular;  Laterality: N/A;  . PERCUTANEOUS CORONARY STENT INTERVENTION (PCI-S)  04/07/2012   Procedure: PERCUTANEOUS CORONARY STENT INTERVENTION (PCI-S);  Surgeon: Sherren Mocha, MD;  Location: [facility] CATH LAB;  Service: Cardiovascular;;    Current Medications: Prior to Admission medications   Medication Sig Start Date End Date Taking? Authorizing Provider  aspirin EC 81 MG EC tablet Take 1 tablet (81 mg total) by mouth daily. 04/09/12   Roger A Arguello, PA-C  blood glucose meter kit and supplies KIT Dispense based on patient and insurance preference. Checks BS BID. (FOR ICD-10 E11.9). Strips : #100/5 refills - Lancets #100/5 refills. 03/26/16   Susy Frizzle, MD  clopidogrel (PLAVIX)  75 MG tablet TAKE ONE TABLET BY MOUTH ONCE DAILY 02/02/16   Susy Frizzle, MD  famotidine (PEPCID) 20 MG tablet TAKE ONE TABLET BY MOUTH TWICE DAILY 02/10/16   Liliane Shi, PA-C  gabapentin (NEURONTIN) 300 MG capsule Take 1 capsule (300 mg total) by mouth 3 (three) times daily. Patient taking differently: Take 600 mg by mouth 3 (three)  times daily.  03/06/16   Susy Frizzle, MD  glipiZIDE (GLUCOTROL) 10 MG tablet TAKE ONE TABLET BY MOUTH TWICE DAILY BEFORE A MEAL 02/02/16   Susy Frizzle, MD  isosorbide mononitrate (IMDUR) 30 MG 24 hr tablet Take 1 tablet (30 mg total) by mouth daily. 03/13/16   Erline Hau, MD  lisinopril (PRINIVIL,ZESTRIL) 10 MG tablet Take 1 tablet (10 mg total) by mouth daily. Patient taking differently: Take 5 mg by mouth daily.  03/13/16   Erline Hau, MD  metFORMIN (GLUCOPHAGE) 1000 MG tablet Take 1 tablet (1,000 mg total) by mouth 2 (two) times daily. 02/02/16   Susy Frizzle, MD  metoprolol tartrate (LOPRESSOR) 25 MG tablet Take 0.5 tablets (12.5 mg total) by mouth 2 (two) times daily. 02/02/16   Susy Frizzle, MD  nitroGLYCERIN (NITROSTAT) 0.4 MG SL tablet Place 1 tablet (0.4 mg total) under the tongue every 5 (five) minutes x 3 doses as needed for chest pain. Patient not taking: Reported on 03/15/2016 02/07/15   Liliane Shi, PA-C  oxyCODONE-acetaminophen (PERCOCET) 7.5-325 MG tablet Take 1 tablet by mouth every 4 (four) hours as needed for severe pain. 02/21/16   Susy Frizzle, MD  pravastatin (PRAVACHOL) 40 MG tablet Take 1 tablet (40 mg total) by mouth every evening. 02/02/16   Susy Frizzle, MD    Allergies:   Patient has no known allergies.   Social History   Social History  . Marital status: Married    Spouse name: N/A  . Number of children: N/A  . Years of education: N/A   Occupational History  . truck driver    Social History Main Topics  . Smoking status: Former Smoker    Years: 20.00    Quit date: 2000  . Smokeless tobacco: Former Systems developer    Quit date: 01/01/1998  . Alcohol use No  . Drug use: No  . Sexual activity: Yes   Other Topics Concern  . None   Social History Narrative   Lives in [place], with his wife and 25 yr old son.     Family History:  The patient's family history includes CAD in his maternal uncle; CVA in his sister; Heart  attack in his father.   ROS:   Please see the history of present illness.    ROS All other systems reviewed and are negative.   PHYSICAL EXAM:   VS:  BP (!) 160/78   Pulse 80   Ht '5\' 9"'$  (1.753 m)   Wt 241 lb 6.4 oz (109.5 kg)   BMI 35.65 kg/m    GEN: Well nourished, well developed, in no acute distress  HEENT: normal  Neck: no JVD, carotid bruits, or masses Cardiac: RRR; no murmurs, rubs, or gallops, Trace edema  Respiratory:  clear to auscultation bilaterally, normal work of breathing GI: soft, nontender, nondistended, + BS MS: no deformity or atrophy  Skin: warm and dry, no rash Neuro:  Alert and Oriented x 3, Strength and sensation are intact Psych: euthymic mood, full affect  Wt Readings from Last 3 Encounters:  04/09/16  241 lb 6.4 oz (109.5 kg)  03/15/16 238 lb (108 kg)  03/11/16 240 lb (108.9 kg)      Studies/Labs Reviewed:   EKG:  EKG is not ordered today.    Recent Labs: 01/27/2016: ALT 30 03/11/2016: BUN 23; Creatinine, Ser 0.82; Hemoglobin 15.4; Platelets 264; Potassium 4.0; Sodium 137; TSH 0.354   Lipid Panel    Component Value Date/Time   CHOL 139 01/27/2016 1211   TRIG 182 (H) 01/27/2016 1211   HDL 25 (L) 01/27/2016 1211   CHOLHDL 5.6 (H) 01/27/2016 1211   VLDL 36 (H) 01/27/2016 1211   LDLCALC 78 01/27/2016 1211    Additional studies/ records that were reviewed today include:   Myoview 03/16/16 Study Result    There was no ST segment deviation noted during stress.  Findings consistent with prior large inferior and inferolateral myocardial infarction. There is a small anteriorinfarct with mild peri-infarct ischemia.  This is a high risk study. High risk based on large scar and decreased LVEF. Fairly mild area of myocardium currently at jeopardy in the anterior wall.  The left ventricular ejection fraction is severely decreased (<30%).    Echocardiogram: 11/2012 Study Conclusions  - Left ventricle: The cavity size was normal. Wall  thickness was increased in a pattern of mild LVH. Systolic function was mildly to moderately reduced. The estimated ejection fraction was in the range of 40% to 45%. There is akinesis of the inferior and posterior myocardium. Doppler parameters are consistent with abnormal left ventricular relaxation (grade 1 diastolic dysfunction). - Left atrium: The atrium was mildly dilated.  Cardiac Catheterization:  04/2012 Coronary angiography: Coronary dominance: right  Left mainstem: Widely patent without obstructive disease  Left anterior descending (LAD): The LAD is diffusely diseased. There is 70% stenosis of the proximal LAD. The diagonal branches arising from this segment are small with 70-80% ostial stenosis. The mid LAD is patent with minor nonobstructive disease. The junction of the mid and distal LAD there is another 70% stenosis present. The LAD wraps around the left ventricular apex.  Left circumflex (LCx): The left circumflex is large in caliber. The first obtuse marginal branch is medium in caliber. The first OM has an 80% proximal stenosis. The mid circumflex just beyond the OM is totally occluded. The second OM fills late.  Right coronary artery (RCA): The right coronary artery is occluded. The vessel is moderate in caliber. It occludes just after the first RV branch. There is late antegrade filling as well as distal filling from left to right collaterals.  Left ventriculography: There is severe hypokinesis of the inferior wall. The left ventricular ejection fraction is estimated at 45%. There is no significant mitral regurgitation.  PCI Note: Following the diagnostic procedure, the decision was made to proceed with PCI. The patient was loaded with brilinta 180 mg. He had already received unfractionated heparin. Weight-based bivalirudin was given for anticoagulation. Once a therapeutic ACT was achieved, a 6 [place] XB LAD guide catheter was inserted. A prolonged  coronary guidewire was used to cross the lesion. The lesion was predilated with a 2.5 x 12 mm balloon. The lesion was then stented with a 4.0 x 24 mm proneness elements drug-eluting stent. The stent was postdilated with a 4.0 x 20 mm noncompliant balloon. Following PCI, there was 0% residual stenosis and TIMI-3 flow. Final angiography confirmed an excellent result. The patient tolerated the procedure well. There were no immediate procedural complications. A TR band was used for radial hemostasis. The patient was transferred to  the post catheterization recovery area for further monitoring.  PCI Data: Vessel - left circumflex/Segment - mid Percent Stenosis (pre) 100 TIMI-flow 0 Stent 4.0 x 24 mm drug-eluting Percent Stenosis (post) 0 TIMI-flow (post) 3  Final Conclusions:  1. Acute inferoposterior myocardial infarction secondary to total occlusion left circumflex, treated successfully with primary PCI. 2. Three-vessel coronary artery disease with chronic total occlusion of the right coronary artery, acute occlusion of the left circumflex, and moderate to severe stenosis of the LAD 3. Moderate LV systolic dysfunction with an estimated left ventricular ejection fraction 45%   Recommendations:  Aggressive medical therapy and risk reduction. Depending on the patient's post MI symptoms, consider outpatient stress testing once he has recovered from his MI for further risk stratification. He will need dual antiplatelet therapy with aspirin and brilinta for at least 12 months if tolerated.    ASSESSMENT & PLAN:    1. Unstable angina - Myoview is a high risk study. High risk based on large scar and decreased LVEF. Fairly mild area of myocardium currently at jeopardy in the anterior wall. Has symptoms is could be angina equivalent or GI in etiology., trial of PPI. Discussed with Dr. Burt Knack will get cath and echo. He drives truck. Discussed use of Nitro. He will go to ER or call us if worsening  symptoms.   The patient understands that risks include but are not limited to stroke (1 in 1000), death (1 in 41), kidney failure [usually temporary] (1 in 500), bleeding (1 in 200), allergic reaction [possibly serious] (1 in 200), and agrees to proceed.   2. CAD s/p DES to [place] in 2014 - Residual disease (as noted above) treated medically.  3. Chronic systolic and diastolic CHF/ICM - EF was 40-45% with grade 1DD on last echo in 2014. Stress test showed EF of <30%. Will update echo. Trace edema. Elevate legs. Try compression stocking.   4. HTN - Elevated Increase metoprolol to '25mg'$  BID. Continue lisinopril '10mg'$ . Keep log.   5. HLD - 01/27/2016: Cholesterol 139; HDL 25; LDL Cholesterol 78; Triglycerides 182; VLDL 36  - Continue Pravastatin 40. No improvement of chest muscle achyness while off statin for few days.   Medication Adjustments/Labs and Tests Ordered: Current medicines are reviewed at length with the patient today.  Concerns regarding medicines are outlined above.  Medication changes, Labs and Tests ordered today are listed in the Patient Instructions below. Patient Instructions  Medication Instructions:  Your physician has recommended you make the following change in your medication:  1.  START Protonix 40 mg taking 1 tablet daily 2.  INCREASE the Metoprolol to 25 mg taking 1 tablet daily  Labwork: TODAY:  CBC, BMET, & PT/INR  Testing/Procedures: Your physician has requested that you have a cardiac catheterization. Cardiac catheterization is used to diagnose and/or treat various heart conditions. Doctors may recommend this procedure for a number of different reasons. The most common reason is to evaluate chest pain. Chest pain can be a symptom of coronary artery disease (CAD), and cardiac catheterization can show whether plaque is narrowing or blocking your heart's arteries. This procedure is also used to evaluate the valves, as well as measure the blood flow and oxygen  levels in different parts of your heart. For further information please visit [email or web address]. Please follow instruction sheet, as given.  Your physician has requested that you have an echocardiogram. Echocardiography is a painless test that uses sound waves to create images of your heart. It provides your doctor with information about  the size and shape of your heart and how well your heart's chambers and valves are working. This procedure takes approximately one hour. There are no restrictions for this procedure.  INSTRUCTIONS FOR THE CATH:    [facility] [address] [place] [address] [place] [address] Dept: [phone] Loc: [place]  04/09/2016  You are scheduled for a Cardiac Catheterization on Friday, April 13 with Dr. Harrell Gave End.  1. Please arrive at the [facility] (Main Entrance A) at [facility]: [place], [place] [address] at 8:30 AM (two hours before your procedure to ensure your preparation). Free valet parking service is available.   Special note: Every effort is made to have your procedure done on time. Please understand that emergencies sometimes delay scheduled procedures.  2. Diet: Do not eat or drink anything after midnight prior to your procedure except sips of water to take medications.  3. Labs: You will need to have blood drawn on Monday, April 9 at [facility] at [facility]. [address]  Open: 7:30am - 5pm    Phone: [phone]. You do not need to be fasting.  4. Medication instructions in preparation for your procedure:  DO NOT TAKE METFORMIN OR GLIPIZIDE ON THE MORNING OF YOUR CATH    On the morning of your procedure, take your Plavix/Clopidogrel, Aspirin, and any morning medicines NOT listed above.  You may use sips of water.  5. Plan for one night stay--bring personal belongings. 6. Bring a  current list of your medications and current insurance cards. 7. You MUST have a responsible person to drive you home. 8. Someone MUST be with you the first 24 hours after you arrive home or your discharge will be delayed. 9. Please wear clothes that are easy to get on and off and wear slip-on shoes.  Thank you for allowing [place] to care for you!   -- [facility]  Follow-Up: Your physician recommends that you schedule a follow-up appointment in: WILL BE SET UP AT DISCHARGE   Any Other Special Instructions Will Be Listed Below (If Applicable).  Coronary Angiogram With Stent Coronary angiogram with stent placement is a procedure to widen or open a narrow blood vessel of the heart (coronary artery). Arteries may become blocked by cholesterol buildup (plaques) in the lining or wall. When a coronary artery becomes partially blocked, blood flow to that area decreases. This may lead to chest pain or a heart attack (myocardial infarction). A stent is a small piece of metal that looks like mesh or a spring. Stent placement may be done as treatment for a heart attack or right after a coronary angiogram in which a blocked artery is found. Let your health care provider know about:  Any allergies you have.  All medicines you are taking, including vitamins, herbs, eye drops, creams, and over-the-counter medicines.  Any problems you or family members have had with anesthetic medicines.  Any blood disorders you have.  Any surgeries you have had.  Any medical conditions you have.  Whether you are pregnant or may be pregnant. What are the risks? Generally, this is a safe procedure. However, problems may occur, including:  Damage to the heart or its blood vessels.  A return of blockage.  Bleeding, infection, or bruising at the insertion site.  A collection of blood under the skin (hematoma) at the insertion site.  A blood clot in another part of the  body.  Kidney injury.  Allergic reaction to the dye or contrast that is used.  Bleeding into the abdomen (retroperitoneal bleeding). What happens before the procedure? Staying hydrated  Follow instructions from your health care provider about hydration, which may include:  Up to 2 hours before the procedure - you may continue to drink clear liquids, such as water, clear fruit juice, black coffee, and plain tea. Eating and drinking restrictions  Follow instructions from your health care provider about eating and drinking, which may include:  8 hours before the procedure - stop eating heavy meals or foods such as meat, fried foods, or fatty foods.  6 hours before the procedure - stop eating light meals or foods, such as toast or cereal.  2 hours before the procedure - stop drinking clear liquids. Ask your health care provider about:  Changing or stopping your regular medicines. This is especially important if you are taking diabetes medicines or blood thinners.  Taking medicines such as ibuprofen. These medicines can thin your blood. Do not take these medicines before your procedure if your health care provider instructs you not to. Generally, aspirin is recommended before a procedure of passing a small, thin tube (catheter) through a blood vessel and into the heart (cardiac catheterization). What happens during the procedure?  An IV tube will be inserted into one of your veins.  You will be given one or more of the following:  A medicine to help you relax (sedative).  A medicine to numb the area where the catheter will be inserted into an artery (local anesthetic).  To reduce your risk of infection:  Your health care team will wash or sanitize their hands.  Your skin will be washed with soap.  Hair may be removed from the area where the catheter will be inserted.  Using a guide wire, the catheter will be inserted into an artery. The location may be in your groin, in your  wrist, or in the fold of your arm (near your elbow).  A type of X-ray (fluoroscopy) will be used to help guide the catheter to the opening of the arteries in the heart.  A dye will be injected into the catheter, and X-rays will be taken. The dye will help to show where any narrowing or blockages are located in the arteries.  A tiny wire will be guided to the blocked spot, and a balloon will be inflated to make the artery wider.  The stent will be expanded and will crush the plaques into the wall of the vessel. The stent will hold the area open and improve the blood flow. Most stents have a drug coating to reduce the risk of the stent narrowing over time.  The artery may be made wider using a drill, laser, or other tools to remove plaques.  When the blood flow is better, the catheter will be removed. The lining of the artery will grow over the stent, which stays where it was placed. This procedure may vary among health care providers and hospitals. What happens after the procedure?  If the procedure is done through the leg, you will be kept in bed lying flat for about 6 hours. You will be instructed to not bend and not cross your legs.  The insertion site will be checked frequently.  The pulse in your foot or wrist will be checked frequently.  You may have additional blood tests, X-rays, and a test that records  the electrical activity of your heart (electrocardiogram, or ECG). This information is not intended to replace advice given to you by your health care provider. Make sure you discuss any questions you have with your health care provider. Document Released: 06/24/2002 Document Revised: 08/18/2015 Document Reviewed: 07/24/2015 Elsevier Interactive Patient Education  2017 [place].  Echocardiogram An echocardiogram, or echocardiography, uses sound waves (ultrasound) to produce an image of your heart. The echocardiogram is simple, painless, obtained within a short period of time,  and offers valuable information to your health care provider. The images from an echocardiogram can provide information such as:  Evidence of coronary artery disease (CAD).  Heart size.  Heart muscle function.  Heart valve function.  Aneurysm detection.  Evidence of a past heart attack.  Fluid buildup around the heart.  Heart muscle thickening.  Assess heart valve function. Tell a health care provider about:  Any allergies you have.  All medicines you are taking, including vitamins, herbs, eye drops, creams, and over-the-counter medicines.  Any problems you or family members have had with anesthetic medicines.  Any blood disorders you have.  Any surgeries you have had.  Any medical conditions you have.  Whether you are pregnant or may be pregnant. What happens before the procedure? No special preparation is needed. Eat and drink normally. What happens during the procedure?  In order to produce an image of your heart, gel will be applied to your chest and a wand-like tool (transducer) will be moved over your chest. The gel will help transmit the sound waves from the transducer. The sound waves will harmlessly bounce off your heart to allow the heart images to be captured in real-time motion. These images will then be recorded.  You may need an IV to receive a medicine that improves the quality of the pictures. What happens after the procedure? You may return to your normal schedule including diet, activities, and medicines, unless your health care provider tells you otherwise. This information is not intended to replace advice given to you by your health care provider. Make sure you discuss any questions you have with your health care provider. Document Released: 12/16/1999 Document Revised: 08/06/2015 Document Reviewed: 08/25/2012 Elsevier Interactive Patient Education  2017 [employer].   If you need a refill on your cardiac medications before your next appointment,  please call your pharmacy.      Jarrett Soho, [place]  04/09/2016 1:06 PM    [facility], [place], [place]  [address] Phone: [phone]; Fax: [phone]

## 2016-04-09 ENCOUNTER — Encounter: Payer: Self-pay | Admitting: Physician Assistant

## 2016-04-09 ENCOUNTER — Ambulatory Visit (INDEPENDENT_AMBULATORY_CARE_PROVIDER_SITE_OTHER): Payer: BLUE CROSS/BLUE SHIELD | Admitting: Physician Assistant

## 2016-04-09 VITALS — BP 160/78 | HR 80 | Ht 69.0 in | Wt 241.4 lb

## 2016-04-09 DIAGNOSIS — I200 Unstable angina: Secondary | ICD-10-CM | POA: Diagnosis not present

## 2016-04-09 DIAGNOSIS — I10 Essential (primary) hypertension: Secondary | ICD-10-CM

## 2016-04-09 DIAGNOSIS — I25119 Atherosclerotic heart disease of native coronary artery with unspecified angina pectoris: Secondary | ICD-10-CM | POA: Diagnosis not present

## 2016-04-09 DIAGNOSIS — E785 Hyperlipidemia, unspecified: Secondary | ICD-10-CM

## 2016-04-09 DIAGNOSIS — I255 Ischemic cardiomyopathy: Secondary | ICD-10-CM

## 2016-04-09 MED ORDER — PANTOPRAZOLE SODIUM 40 MG PO TBEC: 40.0000 mg | DELAYED_RELEASE_TABLET | Freq: Every day | ORAL | 11 refills | Status: DC

## 2016-04-09 MED ORDER — NITROGLYCERIN 0.4 MG SL SUBL: 0.4000 mg | SUBLINGUAL_TABLET | SUBLINGUAL | 1 refills | Status: DC | PRN

## 2016-04-09 MED ORDER — METOPROLOL TARTRATE 25 MG PO TABS: 25.0000 mg | ORAL_TABLET | Freq: Two times a day (BID) | ORAL | 1 refills | Status: DC

## 2016-04-09 NOTE — Patient Instructions (Addendum)
Medication Instructions:  Your physician has recommended you make the following change in your medication:  1.  START Protonix 40 mg taking 1 tablet daily 2.  INCREASE the Metoprolol to 25 mg taking 1 tablet daily  Labwork: TODAY:  CBC, BMET, & PT/INR  Testing/Procedures: Your physician has requested that you have a cardiac catheterization. Cardiac catheterization is used to diagnose and/or treat various heart conditions. Doctors may recommend this procedure for a number of different reasons. The most common reason is to evaluate chest pain. Chest pain can be a symptom of coronary artery disease (CAD), and cardiac catheterization can show whether plaque is narrowing or blocking your heart's arteries. This procedure is also used to evaluate the valves, as well as measure the blood flow and oxygen levels in different parts of your heart. For further information please visit [email or web address]. Please follow instruction sheet, as given.  Your physician has requested that you have an echocardiogram. Echocardiography is a painless test that uses sound waves to create images of your heart. It provides your doctor with information about the size and shape of your heart and how well your heart's chambers and valves are working. This procedure takes approximately one hour. There are no restrictions for this procedure.  INSTRUCTIONS FOR THE CATH:    [place] OFFICE [address] [place] [address] [place] [address] Dept: [phone] Loc: [place]  04/09/2016  You are scheduled for a Cardiac Catheterization on Friday, April 13 with Dr. Harrell Gave End.  1. Please arrive at the [facility] (Main Entrance A) at [facility]: [place], [place] [address] at 8:30 AM (two hours before your procedure to ensure your preparation). Free valet parking service is available.   Special note:  Every effort is made to have your procedure done on time. Please understand that emergencies sometimes delay scheduled procedures.  2. Diet: Do not eat or drink anything after midnight prior to your procedure except sips of water to take medications.  3. Labs: You will need to have blood drawn on Monday, April 9 at [facility]. [address]  Open: 7:30am - 5pm    Phone: [phone]. You do not need to be fasting.  4. Medication instructions in preparation for your procedure:  DO NOT TAKE METFORMIN OR GLIPIZIDE ON THE MORNING OF YOUR CATH    On the morning of your procedure, take your Plavix/Clopidogrel, Aspirin, and any morning medicines NOT listed above.  You may use sips of water.  5. Plan for one night stay--bring personal belongings. 6. Bring a current list of your medications and current insurance cards. 7. You MUST have a responsible person to drive you home. 8. Someone MUST be with you the first 24 hours after you arrive home or your discharge will be delayed. 9. Please wear clothes that are easy to get on and off and wear slip-on shoes.  Thank you for allowing [place] to care for you!   -- [facility]  Follow-Up: Your physician recommends that you schedule a follow-up appointment in: WILL BE SET UP AT DISCHARGE   Any Other Special Instructions Will Be Listed Below (If Applicable).  Coronary Angiogram With Stent Coronary angiogram with stent placement is a procedure to widen or open a narrow blood vessel of the heart (coronary artery). Arteries may become blocked by cholesterol buildup (plaques) in the lining or  wall. When a coronary artery becomes partially blocked, blood flow to that area decreases. This may lead to chest pain or a heart attack (myocardial infarction). A stent is a small piece of metal that looks like mesh or a spring. Stent placement may be done as treatment for a heart attack or right  after a coronary angiogram in which a blocked artery is found. Let your health care provider know about:  Any allergies you have.  All medicines you are taking, including vitamins, herbs, eye drops, creams, and over-the-counter medicines.  Any problems you or family members have had with anesthetic medicines.  Any blood disorders you have.  Any surgeries you have had.  Any medical conditions you have.  Whether you are pregnant or may be pregnant. What are the risks? Generally, this is a safe procedure. However, problems may occur, including:  Damage to the heart or its blood vessels.  A return of blockage.  Bleeding, infection, or bruising at the insertion site.  A collection of blood under the skin (hematoma) at the insertion site.  A blood clot in another part of the body.  Kidney injury.  Allergic reaction to the dye or contrast that is used.  Bleeding into the abdomen (retroperitoneal bleeding). What happens before the procedure? Staying hydrated  Follow instructions from your health care provider about hydration, which may include:  Up to 2 hours before the procedure - you may continue to drink clear liquids, such as water, clear fruit juice, black coffee, and plain tea. Eating and drinking restrictions  Follow instructions from your health care provider about eating and drinking, which may include:  8 hours before the procedure - stop eating heavy meals or foods such as meat, fried foods, or fatty foods.  6 hours before the procedure - stop eating light meals or foods, such as toast or cereal.  2 hours before the procedure - stop drinking clear liquids. Ask your health care provider about:  Changing or stopping your regular medicines. This is especially important if you are taking diabetes medicines or blood thinners.  Taking medicines such as ibuprofen. These medicines can thin your blood. Do not take these medicines before your procedure if your health care  provider instructs you not to. Generally, aspirin is recommended before a procedure of passing a small, thin tube (catheter) through a blood vessel and into the heart (cardiac catheterization). What happens during the procedure?  An IV tube will be inserted into one of your veins.  You will be given one or more of the following:  A medicine to help you relax (sedative).  A medicine to numb the area where the catheter will be inserted into an artery (local anesthetic).  To reduce your risk of infection:  Your health care team will wash or sanitize their hands.  Your skin will be washed with soap.  Hair may be removed from the area where the catheter will be inserted.  Using a guide wire, the catheter will be inserted into an artery. The location may be in your groin, in your wrist, or in the fold of your arm (near your elbow).  A type of X-ray (fluoroscopy) will be used to help guide the catheter to the opening of the arteries in the heart.  A dye will be injected into the catheter, and X-rays will be taken. The dye will help to show where any narrowing or blockages are located in the arteries.  A tiny wire will be guided to the blocked  spot, and a balloon will be inflated to make the artery wider.  The stent will be expanded and will crush the plaques into the wall of the vessel. The stent will hold the area open and improve the blood flow. Most stents have a drug coating to reduce the risk of the stent narrowing over time.  The artery may be made wider using a drill, laser, or other tools to remove plaques.  When the blood flow is better, the catheter will be removed. The lining of the artery will grow over the stent, which stays where it was placed. This procedure may vary among health care providers and hospitals. What happens after the procedure?  If the procedure is done through the leg, you will be kept in bed lying flat for about 6 hours. You will be instructed to not bend  and not cross your legs.  The insertion site will be checked frequently.  The pulse in your foot or wrist will be checked frequently.  You may have additional blood tests, X-rays, and a test that records the electrical activity of your heart (electrocardiogram, or ECG). This information is not intended to replace advice given to you by your health care provider. Make sure you discuss any questions you have with your health care provider. Document Released: 06/24/2002 Document Revised: 08/18/2015 Document Reviewed: 07/24/2015 Elsevier Interactive Patient Education  2017 [employer].  Echocardiogram An echocardiogram, or echocardiography, uses sound waves (ultrasound) to produce an image of your heart. The echocardiogram is simple, painless, obtained within a short period of time, and offers valuable information to your health care provider. The images from an echocardiogram can provide information such as:  Evidence of coronary artery disease (CAD).  Heart size.  Heart muscle function.  Heart valve function.  Aneurysm detection.  Evidence of a past heart attack.  Fluid buildup around the heart.  Heart muscle thickening.  Assess heart valve function. Tell a health care provider about:  Any allergies you have.  All medicines you are taking, including vitamins, herbs, eye drops, creams, and over-the-counter medicines.  Any problems you or family members have had with anesthetic medicines.  Any blood disorders you have.  Any surgeries you have had.  Any medical conditions you have.  Whether you are pregnant or may be pregnant. What happens before the procedure? No special preparation is needed. Eat and drink normally. What happens during the procedure?  In order to produce an image of your heart, gel will be applied to your chest and a wand-like tool (transducer) will be moved over your chest. The gel will help transmit the sound waves from the transducer. The sound  waves will harmlessly bounce off your heart to allow the heart images to be captured in real-time motion. These images will then be recorded.  You may need an IV to receive a medicine that improves the quality of the pictures. What happens after the procedure? You may return to your normal schedule including diet, activities, and medicines, unless your health care provider tells you otherwise. This information is not intended to replace advice given to you by your health care provider. Make sure you discuss any questions you have with your health care provider. Document Released: 12/16/1999 Document Revised: 08/06/2015 Document Reviewed: 08/25/2012 Elsevier Interactive Patient Education  2017 [employer].   If you need a refill on your cardiac medications before your next appointment, please call your pharmacy.

## 2016-04-10 LAB — CBC
HEMATOCRIT: 43.6 % (ref 37.5–51.0)
HEMOGLOBIN: 14.9 g/dL (ref 13.0–17.7)
MCH: 31.5 pg (ref 26.6–33.0)
MCHC: 34.2 g/dL (ref 31.5–35.7)
MCV: 92 fL (ref 79–97)
Platelets: 245 10*3/uL (ref 150–379)
RBC: 4.73 x10E6/uL (ref 4.14–5.80)
RDW: 13.8 % (ref 12.3–15.4)
WBC: 8.8 10*3/uL (ref 3.4–10.8)

## 2016-04-10 LAB — BASIC METABOLIC PANEL
BUN/Creatinine Ratio: 24 — ABNORMAL HIGH (ref 9–20)
BUN: 17 mg/dL (ref 6–24)
CHLORIDE: 101 mmol/L (ref 96–106)
CO2: 26 mmol/L (ref 18–29)
CREATININE: 0.72 mg/dL — AB (ref 0.76–1.27)
Calcium: 9.7 mg/dL (ref 8.7–10.2)
GFR calc non Af Amer: 104 mL/min/{1.73_m2} (ref >59)
GFR, EST AFRICAN AMERICAN: 120 mL/min/{1.73_m2} (ref >59)
Glucose: 160 mg/dL — ABNORMAL HIGH (ref 65–99)
POTASSIUM: 4 mmol/L (ref 3.5–5.2)
Sodium: 142 mmol/L (ref 134–144)

## 2016-04-10 LAB — PROTIME-INR
INR: 1 (ref 0.8–1.2)
Prothrombin Time: 10.4 s (ref 9.1–12.0)

## 2016-04-13 ENCOUNTER — Encounter (HOSPITAL_COMMUNITY)
Admission: RE | Disposition: A | Discharge status: Home / Self Care | Payer: Self-pay | Source: Ambulatory Visit | Attending: Internal Medicine

## 2016-04-13 ENCOUNTER — Ambulatory Visit (HOSPITAL_COMMUNITY)
Admission: RE | Admit: 2016-04-13 | Discharge: 2016-04-13 | Disposition: A | Discharge status: Home / Self Care | Payer: BLUE CROSS/BLUE SHIELD | Source: Ambulatory Visit | Attending: Internal Medicine | Admitting: Internal Medicine

## 2016-04-13 DIAGNOSIS — T82855A Stenosis of coronary artery stent, initial encounter: Secondary | ICD-10-CM | POA: Insufficient documentation

## 2016-04-13 DIAGNOSIS — I255 Ischemic cardiomyopathy: Secondary | ICD-10-CM | POA: Insufficient documentation

## 2016-04-13 DIAGNOSIS — Z6835 Body mass index (BMI) 35.0-35.9, adult: Secondary | ICD-10-CM | POA: Diagnosis not present

## 2016-04-13 DIAGNOSIS — I25110 Atherosclerotic heart disease of native coronary artery with unstable angina pectoris: Secondary | ICD-10-CM | POA: Diagnosis not present

## 2016-04-13 DIAGNOSIS — Y831 Surgical operation with implant of artificial internal device as the cause of abnormal reaction of the patient, or of later complication, without mention of misadventure at the time of the procedure: Secondary | ICD-10-CM | POA: Diagnosis not present

## 2016-04-13 DIAGNOSIS — E1140 Type 2 diabetes mellitus with diabetic neuropathy, unspecified: Secondary | ICD-10-CM | POA: Insufficient documentation

## 2016-04-13 DIAGNOSIS — Z87891 Personal history of nicotine dependence: Secondary | ICD-10-CM | POA: Diagnosis not present

## 2016-04-13 DIAGNOSIS — I110 Hypertensive heart disease with heart failure: Secondary | ICD-10-CM | POA: Diagnosis not present

## 2016-04-13 DIAGNOSIS — R079 Chest pain, unspecified: Secondary | ICD-10-CM | POA: Diagnosis not present

## 2016-04-13 DIAGNOSIS — R9439 Abnormal result of other cardiovascular function study: Secondary | ICD-10-CM | POA: Diagnosis not present

## 2016-04-13 DIAGNOSIS — Z7902 Long term (current) use of antithrombotics/antiplatelets: Secondary | ICD-10-CM | POA: Diagnosis not present

## 2016-04-13 DIAGNOSIS — I252 Old myocardial infarction: Secondary | ICD-10-CM | POA: Diagnosis not present

## 2016-04-13 DIAGNOSIS — I2584 Coronary atherosclerosis due to calcified coronary lesion: Secondary | ICD-10-CM | POA: Diagnosis not present

## 2016-04-13 DIAGNOSIS — I5042 Chronic combined systolic (congestive) and diastolic (congestive) heart failure: Secondary | ICD-10-CM | POA: Diagnosis not present

## 2016-04-13 DIAGNOSIS — E7800 Pure hypercholesterolemia, unspecified: Secondary | ICD-10-CM | POA: Insufficient documentation

## 2016-04-13 DIAGNOSIS — I2582 Chronic total occlusion of coronary artery: Secondary | ICD-10-CM | POA: Insufficient documentation

## 2016-04-13 DIAGNOSIS — Z7984 Long term (current) use of oral hypoglycemic drugs: Secondary | ICD-10-CM | POA: Insufficient documentation

## 2016-04-13 DIAGNOSIS — Z8711 Personal history of peptic ulcer disease: Secondary | ICD-10-CM | POA: Diagnosis not present

## 2016-04-13 DIAGNOSIS — E669 Obesity, unspecified: Secondary | ICD-10-CM | POA: Insufficient documentation

## 2016-04-13 DIAGNOSIS — Z7982 Long term (current) use of aspirin: Secondary | ICD-10-CM | POA: Diagnosis not present

## 2016-04-13 DIAGNOSIS — Z8249 Family history of ischemic heart disease and other diseases of the circulatory system: Secondary | ICD-10-CM | POA: Insufficient documentation

## 2016-04-13 HISTORY — PX: LEFT HEART CATH AND CORONARY ANGIOGRAPHY: CATH118249

## 2016-04-13 HISTORY — PX: INTRAVASCULAR PRESSURE WIRE/FFR STUDY: CATH118243

## 2016-04-13 LAB — GLUCOSE, CAPILLARY
GLUCOSE-CAPILLARY: 162 mg/dL — AB (ref 65–99)
Glucose-Capillary: 131 mg/dL — ABNORMAL HIGH (ref 65–99)

## 2016-04-13 LAB — POCT ACTIVATED CLOTTING TIME: Activated Clotting Time: 246 seconds

## 2016-04-13 SURGERY — LEFT HEART CATH AND CORONARY ANGIOGRAPHY
Anesthesia: LOCAL

## 2016-04-13 MED ORDER — NITROGLYCERIN 0.4 MG SL SUBL: 0.4000 mg | SUBLINGUAL_TABLET | SUBLINGUAL | 99 refills | Status: DC | PRN

## 2016-04-13 MED ORDER — MIDAZOLAM HCL 2 MG/2ML IJ SOLN
INTRAMUSCULAR | Status: AC
  Filled 2016-04-13: qty 2

## 2016-04-13 MED ORDER — HEPARIN SODIUM (PORCINE) 1000 UNIT/ML IJ SOLN
INTRAMUSCULAR | Status: DC | PRN
  Administered 2016-04-13: 5000 [IU] via INTRAVENOUS
  Administered 2016-04-13: 2000 [IU] via INTRAVENOUS
  Administered 2016-04-13: 5000 [IU] via INTRAVENOUS

## 2016-04-13 MED ORDER — ASPIRIN 81 MG PO CHEW: 81.0000 mg | CHEWABLE_TABLET | ORAL | Status: DC

## 2016-04-13 MED ORDER — SODIUM CHLORIDE 0.9% FLUSH: 3.0000 mL | INTRAVENOUS | Status: DC | PRN

## 2016-04-13 MED ORDER — IOPAMIDOL (ISOVUE-370) INJECTION 76%
INTRAVENOUS | Status: AC
  Filled 2016-04-13: qty 100

## 2016-04-13 MED ORDER — LIDOCAINE HCL (PF) 1 % IJ SOLN
INTRAMUSCULAR | Status: DC | PRN
  Administered 2016-04-13: 2 mL

## 2016-04-13 MED ORDER — ISOSORBIDE MONONITRATE ER 30 MG PO TB24: 30.0000 mg | ORAL_TABLET | Freq: Every day | ORAL | 5 refills | Status: DC

## 2016-04-13 MED ORDER — FENTANYL CITRATE (PF) 100 MCG/2ML IJ SOLN
INTRAMUSCULAR | Status: AC
  Filled 2016-04-13: qty 2

## 2016-04-13 MED ORDER — IOPAMIDOL (ISOVUE-370) INJECTION 76%
INTRAVENOUS | Status: DC | PRN
  Administered 2016-04-13: 100 mL via INTRA_ARTERIAL

## 2016-04-13 MED ORDER — SODIUM CHLORIDE 0.9 % IV SOLN: 250.0000 mL | INTRAVENOUS | Status: DC | PRN

## 2016-04-13 MED ORDER — SODIUM CHLORIDE 0.9 % IV SOLN
INTRAVENOUS | Status: DC
  Administered 2016-04-13: 09:00:00 via INTRAVENOUS

## 2016-04-13 MED ORDER — VERAPAMIL HCL 2.5 MG/ML IV SOLN
INTRAVENOUS | Status: AC
  Filled 2016-04-13: qty 2

## 2016-04-13 MED ORDER — SODIUM CHLORIDE 0.9% FLUSH: 3.0000 mL | Freq: Two times a day (BID) | INTRAVENOUS | Status: DC

## 2016-04-13 MED ORDER — ADENOSINE 12 MG/4ML IV SOLN
INTRAVENOUS | Status: AC
  Filled 2016-04-13: qty 16

## 2016-04-13 MED ORDER — HEPARIN (PORCINE) IN NACL 2-0.9 UNIT/ML-% IJ SOLN
INTRAMUSCULAR | Status: AC
  Filled 2016-04-13: qty 1000

## 2016-04-13 MED ORDER — ATORVASTATIN CALCIUM 40 MG PO TABS: 40.0000 mg | ORAL_TABLET | Freq: Every day | ORAL | 5 refills | Status: DC

## 2016-04-13 MED ORDER — SODIUM CHLORIDE 0.9 % IV SOLN: INTRAVENOUS | Status: DC

## 2016-04-13 MED ORDER — HEPARIN (PORCINE) IN NACL 2-0.9 UNIT/ML-% IJ SOLN
INTRAMUSCULAR | Status: DC | PRN
  Administered 2016-04-13: 1000 mL

## 2016-04-13 MED ORDER — FENTANYL CITRATE (PF) 100 MCG/2ML IJ SOLN
INTRAMUSCULAR | Status: DC | PRN
  Administered 2016-04-13: 50 ug via INTRAVENOUS

## 2016-04-13 MED ORDER — IOPAMIDOL (ISOVUE-370) INJECTION 76%
INTRAVENOUS | Status: AC
  Filled 2016-04-13: qty 50

## 2016-04-13 MED ORDER — HEPARIN SODIUM (PORCINE) 1000 UNIT/ML IJ SOLN
INTRAMUSCULAR | Status: AC
  Filled 2016-04-13: qty 1

## 2016-04-13 MED ORDER — MIDAZOLAM HCL 2 MG/2ML IJ SOLN
INTRAMUSCULAR | Status: DC | PRN
  Administered 2016-04-13: 1 mg via INTRAVENOUS

## 2016-04-13 MED ORDER — LIDOCAINE HCL (PF) 1 % IJ SOLN
INTRAMUSCULAR | Status: AC
  Filled 2016-04-13: qty 30

## 2016-04-13 MED ORDER — VERAPAMIL HCL 2.5 MG/ML IV SOLN
INTRAVENOUS | Status: DC | PRN
  Administered 2016-04-13: 10 mL via INTRA_ARTERIAL

## 2016-04-13 SURGICAL SUPPLY — 14 items
CATH 5FR JL3.5 JR4 ANG PIG MP (CATHETERS) ×2 IMPLANT
CATH LAUNCHER 5F EBU3.0 (CATHETERS) ×1 IMPLANT
CATHETER LAUNCHER 5F EBU3.0 (CATHETERS) ×2
DEVICE RAD COMP TR BAND LRG (VASCULAR PRODUCTS) ×2 IMPLANT
GLIDESHEATH SLEND SS 6F .021 (SHEATH) ×2 IMPLANT
GUIDEWIRE INQWIRE 1.5J.035X260 (WIRE) ×1 IMPLANT
GUIDEWIRE PRESSURE COMET II (WIRE) ×2 IMPLANT
INQWIRE 1.5J .035X260CM (WIRE) ×2
KIT ESSENTIALS PG (KITS) ×2 IMPLANT
KIT HEART LEFT (KITS) ×2 IMPLANT
PACK CARDIAC CATHETERIZATION (CUSTOM PROCEDURE TRAY) ×2 IMPLANT
SYR MEDRAD MARK V 150ML (SYRINGE) ×2 IMPLANT
TRANSDUCER W/STOPCOCK (MISCELLANEOUS) ×2 IMPLANT
TUBING CIL FLEX 10 FLL-RA (TUBING) ×2 IMPLANT

## 2016-04-13 NOTE — Interval H&P Note (Signed)
History and Physical Interval Note:  04/13/2016 10:19 AM  Juan Ray  has presented today for surgery, with the diagnosis of unstable angina. The various methods of treatment have been discussed with the patient and family. After consideration of risks, benefits and other options for treatment, the patient has consented to  Procedure(s): Left Heart Cath and Coronary Angiography (N/A) as a surgical intervention .  The patient's history has been reviewed, patient examined, no change in status, stable for surgery.  I have reviewed the patient's chart and labs.  Questions were answered to the patient's satisfaction.    Cath Lab Visit (complete for each Cath Lab visit)  Clinical Evaluation Leading to the Procedure:   ACS: No.  Non-ACS:    Anginal Classification: CCS IV  Anti-ischemic medical therapy: Minimal Therapy (1 class of medications)  Non-Invasive Test Results: High-risk stress test findings: cardiac mortality >3%/year  Prior CABG: No previous CABG  Zebedee Segundo

## 2016-04-13 NOTE — Discharge Instructions (Signed)
Radial Site Care °Refer to this sheet in the next few weeks. These instructions provide you with information about caring for yourself after your procedure. Your health care provider may also give you more specific instructions. Your treatment has been planned according to current medical practices, but problems sometimes occur. Call your health care provider if you have any problems or questions after your procedure. °What can I expect after the procedure? °After your procedure, it is typical to have the following: °· Bruising at the radial site that usually fades within 1-2 weeks. °· Blood collecting in the tissue (hematoma) that may be painful to the touch. It should usually decrease in size and tenderness within 1-2 weeks. °Follow these instructions at home: °· Take medicines only as directed by your health care provider. °· You may shower 24-48 hours after the procedure or as directed by your health care provider. Remove the bandage (dressing) and gently wash the site with plain soap and water. Pat the area dry with a clean towel. Do not rub the site, because this may cause bleeding. °· Do not take baths, swim, or use a hot tub until your health care provider approves. °· Check your insertion site every day for redness, swelling, or drainage. °· Do not apply powder or lotion to the site. °· Do not flex or bend the affected arm for 24 hours or as directed by your health care provider. °· Do not push or pull heavy objects with the affected arm for 24 hours or as directed by your health care provider. °· Do not lift over 10 lb (4.5 kg) for 5 days after your procedure or as directed by your health care provider. °· Ask your health care provider when it is okay to: °¨ Return to work or school. °¨ Resume usual physical activities or sports. °¨ Resume sexual activity. °· Do not drive home if you are discharged the same day as the procedure. Have someone else drive you. °· You may drive 24 hours after the procedure  unless otherwise instructed by your health care provider. °· Do not operate machinery or power tools for 24 hours after the procedure. °· If your procedure was done as an outpatient procedure, which means that you went home the same day as your procedure, a responsible adult should be with you for the first 24 hours after you arrive home. °· Keep all follow-up visits as directed by your health care provider. This is important. °Contact a health care provider if: °· You have a fever. °· You have chills. °· You have increased bleeding from the radial site. Hold pressure on the site. °Get help right away if: °· You have unusual pain at the radial site. °· You have redness, warmth, or swelling at the radial site. °· You have drainage (other than a small amount of blood on the dressing) from the radial site. °· The radial site is bleeding, and the bleeding does not stop after 30 minutes of holding steady pressure on the site. °· Your arm or hand becomes pale, cool, tingly, or numb. °This information is not intended to replace advice given to you by your health care provider. Make sure you discuss any questions you have with your health care provider. °Document Released: 01/20/2010 Document Revised: 05/26/2015 Document Reviewed: 07/06/2013 °Elsevier Interactive Patient Education © 2017 [employer] ° °

## 2016-04-13 NOTE — Brief Op Note (Signed)
Brief Cardiac Catheterization Note (Full Report to Follow)  Date: 04/13/2016 Time: 11:14 AM  PATIENT:  Juan Ray  56 y.o. male  PRE-OPERATIVE DIAGNOSIS:  Chest pain  POST-OPERATIVE DIAGNOSIS:  Multivessel coronary artery disease  PROCEDURE:  Procedure(s): Left Heart Cath and Coronary Angiography (N/A)  SURGEON:  Surgeon(s) and Role:    * Nelva Bush, MD - Primary  FINDINGS: 1.  Significant 3-vessel coronary artery disease, including sequential 70% proximal and mid LAD stenoses (resting Pd/Pa 0.72), 50% in-stent restenosis in LCx with 90% lesions in distal LCx and OM2, and CTO of mid RCA with bridging and left-to-right collaterals filling the distal vessel. 2.  Moderately reduced LVEF with inferior akinesis. 3.  Normal left ventricular filling pressure.  RECOMMENDATIONS: 1.  Cardiac surgery consultation for CABG, given 3-vessel CAD, DM, and reduced LVEF. 2.  Optimize medical therapy, including high-intensity statin therapy and long-acting nitrate.  Nelva Bush, MD [facility] Pager: [phone]

## 2016-04-13 NOTE — H&P (View-Only) (Signed)
Cardiology Office Note    Date:  04/09/2016   ID:  Alim Cattell, DOB July 18, 1960, MRN [id number]  PCP:  Odette Fraction, MD  Cardiologist:  Dr. Burt Knack  Chief Complaint: Hospital follow up for chest pain and abnormal stress test  History of Present Illness:   Juan Ray is a 56 y.o. male CAD, DM2, HTN, HL, PUD presents to discuss abnormal myoview.   He suffered an inf-post STEMI 04/2012 treated with Promus DES to [facility]. Residual disease treated medically. Last seen by Dr. Burt Knack 11/2012. When seen in office 02/2015 he had lost his insurance and had been off of his medications for 2 months. He was restarted on medications through [facility] $4 pharmacy.   Admitted 3/12-3/13 for recurrent chest pain. Features worrisome for unstable angina. 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 Chest pain resolved before came to ER. Marland KitchenTroponin is negative X 3, without acute ST T wave changes, arguing against ACS. Recommended outpatient stress test which showed:    There was no ST segment deviation noted during stress.  Findings consistent with prior large inferior and inferolateral myocardial infarction. There is a small anteriorinfarct with mild peri-infarct ischemia.  This is a high risk study. High risk based on large scar and decreased LVEF. Fairly mild area of myocardium currently at jeopardy in the anterior wall.  The left ventricular ejection fraction is severely decreased (<30%).  Here today for further discussion. He continues to have "constant dull achy chest pain" all across his chest. Intermittent dyspnea and LE edema. Denies orthopnea, PND, syncope. BP runs in 150-170-80-100s at home. Hx of gastric ulcer. Does not take any medications for this. His symptoms has been stable for the past one months. Stopped Imdur due to hypotension and dizziness prior to last admission.    Past Medical History:  Diagnosis Date  . CAD (coronary artery  disease) 04/07/2012   Inferoposterior STEMI s/p DES-mid LCx  . Diabetes mellitus   . Diabetic neuropathy ([place])   . High cholesterol   . Hypertension   . Myocardial infarction   . Obesity   . Pancreatitis    a. mild by CT 06/2011  . Peptic ulcer disease    a. 06/2009 EGD: multiple gastric and duodenal ulcers.    Past Surgical History:  Procedure Laterality Date  . CORONARY ANGIOPLASTY WITH STENT PLACEMENT  04/07/2012   70% prox LAD, 70-80% ostial diagonal, 70% mid-distal LAD, 80% prox OM1, mid LCx totally occluded just distal to OM1 s/p DES, occluded RCA; severe inferior HK, LVEF 45%  . ESOPHAGOGASTRODUODENOSCOPY  06/18/2011   NL ESOPHAGUS/UlcerATED LESIONS/ SUPERFICIAL Ulcers  . LEFT HEART CATHETERIZATION WITH CORONARY ANGIOGRAM N/A 04/07/2012   Procedure: LEFT HEART CATHETERIZATION WITH CORONARY ANGIOGRAM;  Surgeon: Sherren Mocha, MD;  Location: [facility] CATH LAB;  Service: Cardiovascular;  Laterality: N/A;  . PERCUTANEOUS CORONARY STENT INTERVENTION (PCI-S)  04/07/2012   Procedure: PERCUTANEOUS CORONARY STENT INTERVENTION (PCI-S);  Surgeon: Sherren Mocha, MD;  Location: [facility] CATH LAB;  Service: Cardiovascular;;    Current Medications: Prior to Admission medications   Medication Sig Start Date End Date Taking? Authorizing Provider  aspirin EC 81 MG EC tablet Take 1 tablet (81 mg total) by mouth daily. 04/09/12   Roger A Arguello, PA-C  blood glucose meter kit and supplies KIT Dispense based on patient and insurance preference. Checks BS BID. (FOR ICD-10 E11.9). Strips : #100/5 refills - Lancets #100/5 refills. 03/26/16   Susy Frizzle, MD  clopidogrel (PLAVIX)  75 MG tablet TAKE ONE TABLET BY MOUTH ONCE DAILY 02/02/16   Susy Frizzle, MD  famotidine (PEPCID) 20 MG tablet TAKE ONE TABLET BY MOUTH TWICE DAILY 02/10/16   Liliane Shi, PA-C  gabapentin (NEURONTIN) 300 MG capsule Take 1 capsule (300 mg total) by mouth 3 (three) times daily. Patient taking differently: Take 600 mg by mouth 3 (three)  times daily.  03/06/16   Susy Frizzle, MD  glipiZIDE (GLUCOTROL) 10 MG tablet TAKE ONE TABLET BY MOUTH TWICE DAILY BEFORE A MEAL 02/02/16   Susy Frizzle, MD  isosorbide mononitrate (IMDUR) 30 MG 24 hr tablet Take 1 tablet (30 mg total) by mouth daily. 03/13/16   Erline Hau, MD  lisinopril (PRINIVIL,ZESTRIL) 10 MG tablet Take 1 tablet (10 mg total) by mouth daily. Patient taking differently: Take 5 mg by mouth daily.  03/13/16   Erline Hau, MD  metFORMIN (GLUCOPHAGE) 1000 MG tablet Take 1 tablet (1,000 mg total) by mouth 2 (two) times daily. 02/02/16   Susy Frizzle, MD  metoprolol tartrate (LOPRESSOR) 25 MG tablet Take 0.5 tablets (12.5 mg total) by mouth 2 (two) times daily. 02/02/16   Susy Frizzle, MD  nitroGLYCERIN (NITROSTAT) 0.4 MG SL tablet Place 1 tablet (0.4 mg total) under the tongue every 5 (five) minutes x 3 doses as needed for chest pain. Patient not taking: Reported on 03/15/2016 02/07/15   Liliane Shi, PA-C  oxyCODONE-acetaminophen (PERCOCET) 7.5-325 MG tablet Take 1 tablet by mouth every 4 (four) hours as needed for severe pain. 02/21/16   Susy Frizzle, MD  pravastatin (PRAVACHOL) 40 MG tablet Take 1 tablet (40 mg total) by mouth every evening. 02/02/16   Susy Frizzle, MD    Allergies:   Patient has no known allergies.   Social History   Social History  . Marital status: Married    Spouse name: N/A  . Number of children: N/A  . Years of education: N/A   Occupational History  . truck driver    Social History Main Topics  . Smoking status: Former Smoker    Years: 20.00    Quit date: 2000  . Smokeless tobacco: Former Systems developer    Quit date: 01/01/1998  . Alcohol use No  . Drug use: No  . Sexual activity: Yes   Other Topics Concern  . None   Social History Narrative   Lives in [place], with his wife and 20 yr old son.     Family History:  The patient's family history includes CAD in his maternal uncle; CVA in his sister; Heart  attack in his father.   ROS:   Please see the history of present illness.    ROS All other systems reviewed and are negative.   PHYSICAL EXAM:   VS:  BP (!) 160/78   Pulse 80   Ht '5\' 9"'$  (1.753 m)   Wt 241 lb 6.4 oz (109.5 kg)   BMI 35.65 kg/m    GEN: Well nourished, well developed, in no acute distress  HEENT: normal  Neck: no JVD, carotid bruits, or masses Cardiac: RRR; no murmurs, rubs, or gallops, Trace edema  Respiratory:  clear to auscultation bilaterally, normal work of breathing GI: soft, nontender, nondistended, + BS MS: no deformity or atrophy  Skin: warm and dry, no rash Neuro:  Alert and Oriented x 3, Strength and sensation are intact Psych: euthymic mood, full affect  Wt Readings from Last 3 Encounters:  04/09/16  241 lb 6.4 oz (109.5 kg)  03/15/16 238 lb (108 kg)  03/11/16 240 lb (108.9 kg)      Studies/Labs Reviewed:   EKG:  EKG is not ordered today.    Recent Labs: 01/27/2016: ALT 30 03/11/2016: BUN 23; Creatinine, Ser 0.82; Hemoglobin 15.4; Platelets 264; Potassium 4.0; Sodium 137; TSH 0.354   Lipid Panel    Component Value Date/Time   CHOL 139 01/27/2016 1211   TRIG 182 (H) 01/27/2016 1211   HDL 25 (L) 01/27/2016 1211   CHOLHDL 5.6 (H) 01/27/2016 1211   VLDL 36 (H) 01/27/2016 1211   LDLCALC 78 01/27/2016 1211    Additional studies/ records that were reviewed today include:   Myoview 03/16/16 Study Result    There was no ST segment deviation noted during stress.  Findings consistent with prior large inferior and inferolateral myocardial infarction. There is a small anteriorinfarct with mild peri-infarct ischemia.  This is a high risk study. High risk based on large scar and decreased LVEF. Fairly mild area of myocardium currently at jeopardy in the anterior wall.  The left ventricular ejection fraction is severely decreased (<30%).    Echocardiogram: 11/2012 Study Conclusions  - Left ventricle: The cavity size was normal. Wall  thickness was increased in a pattern of mild LVH. Systolic function was mildly to moderately reduced. The estimated ejection fraction was in the range of 40% to 45%. There is akinesis of the inferior and posterior myocardium. Doppler parameters are consistent with abnormal left ventricular relaxation (grade 1 diastolic dysfunction). - Left atrium: The atrium was mildly dilated.  Cardiac Catheterization:  04/2012 Coronary angiography: Coronary dominance: right  Left mainstem: Widely patent without obstructive disease  Left anterior descending (LAD): The LAD is diffusely diseased. There is 70% stenosis of the proximal LAD. The diagonal branches arising from this segment are small with 70-80% ostial stenosis. The mid LAD is patent with minor nonobstructive disease. The junction of the mid and distal LAD there is another 70% stenosis present. The LAD wraps around the left ventricular apex.  Left circumflex (LCx): The left circumflex is large in caliber. The first obtuse marginal branch is medium in caliber. The first OM has an 80% proximal stenosis. The mid circumflex just beyond the OM is totally occluded. The second OM fills late.  Right coronary artery (RCA): The right coronary artery is occluded. The vessel is moderate in caliber. It occludes just after the first RV branch. There is late antegrade filling as well as distal filling from left to right collaterals.  Left ventriculography: There is severe hypokinesis of the inferior wall. The left ventricular ejection fraction is estimated at 45%. There is no significant mitral regurgitation.  PCI Note: Following the diagnostic procedure, the decision was made to proceed with PCI. The patient was loaded with brilinta 180 mg. He had already received unfractionated heparin. Weight-based bivalirudin was given for anticoagulation. Once a therapeutic ACT was achieved, a 6 [place] XB LAD guide catheter was inserted. A prolonged  coronary guidewire was used to cross the lesion. The lesion was predilated with a 2.5 x 12 mm balloon. The lesion was then stented with a 4.0 x 24 mm proneness elements drug-eluting stent. The stent was postdilated with a 4.0 x 20 mm noncompliant balloon. Following PCI, there was 0% residual stenosis and TIMI-3 flow. Final angiography confirmed an excellent result. The patient tolerated the procedure well. There were no immediate procedural complications. A TR band was used for radial hemostasis. The patient was transferred to  the post catheterization recovery area for further monitoring.  PCI Data: Vessel - left circumflex/Segment - mid Percent Stenosis (pre) 100 TIMI-flow 0 Stent 4.0 x 24 mm drug-eluting Percent Stenosis (post) 0 TIMI-flow (post) 3  Final Conclusions:  1. Acute inferoposterior myocardial infarction secondary to total occlusion left circumflex, treated successfully with primary PCI. 2. Three-vessel coronary artery disease with chronic total occlusion of the right coronary artery, acute occlusion of the left circumflex, and moderate to severe stenosis of the LAD 3. Moderate LV systolic dysfunction with an estimated left ventricular ejection fraction 45%   Recommendations:  Aggressive medical therapy and risk reduction. Depending on the patient's post MI symptoms, consider outpatient stress testing once he has recovered from his MI for further risk stratification. He will need dual antiplatelet therapy with aspirin and brilinta for at least 12 months if tolerated.    ASSESSMENT & PLAN:    1. Unstable angina - Myoview is a high risk study. High risk based on large scar and decreased LVEF. Fairly mild area of myocardium currently at jeopardy in the anterior wall. Has symptoms is could be angina equivalent or GI in etiology., trial of PPI. Discussed with Dr. Burt Knack will get cath and echo. He drives truck. Discussed use of Nitro. He will go to ER or call us if worsening  symptoms.   The patient understands that risks include but are not limited to stroke (1 in 1000), death (1 in 3), kidney failure [usually temporary] (1 in 500), bleeding (1 in 200), allergic reaction [possibly serious] (1 in 200), and agrees to proceed.   2. CAD s/p DES to [place] in 2014 - Residual disease (as noted above) treated medically.  3. Chronic systolic and diastolic CHF/ICM - EF was 40-45% with grade 1DD on last echo in 2014. Stress test showed EF of <30%. Will update echo. Trace edema. Elevate legs. Try compression stocking.   4. HTN - Elevated Increase metoprolol to '25mg'$  BID. Continue lisinopril '10mg'$ . Keep log.   5. HLD - 01/27/2016: Cholesterol 139; HDL 25; LDL Cholesterol 78; Triglycerides 182; VLDL 36  - Continue Pravastatin 40. No improvement of chest muscle achyness while off statin for few days.   Medication Adjustments/Labs and Tests Ordered: Current medicines are reviewed at length with the patient today.  Concerns regarding medicines are outlined above.  Medication changes, Labs and Tests ordered today are listed in the Patient Instructions below. Patient Instructions  Medication Instructions:  Your physician has recommended you make the following change in your medication:  1.  START Protonix 40 mg taking 1 tablet daily 2.  INCREASE the Metoprolol to 25 mg taking 1 tablet daily  Labwork: TODAY:  CBC, BMET, & PT/INR  Testing/Procedures: Your physician has requested that you have a cardiac catheterization. Cardiac catheterization is used to diagnose and/or treat various heart conditions. Doctors may recommend this procedure for a number of different reasons. The most common reason is to evaluate chest pain. Chest pain can be a symptom of coronary artery disease (CAD), and cardiac catheterization can show whether plaque is narrowing or blocking your heart's arteries. This procedure is also used to evaluate the valves, as well as measure the blood flow and oxygen  levels in different parts of your heart. For further information please visit [email or web address]tn. Please follow instruction sheet, as given.  Your physician has requested that you have an echocardiogram. Echocardiography is a painless test that uses sound waves to create images of your heart. It provides your doctor with information about  the size and shape of your heart and how well your heart's chambers and valves are working. This procedure takes approximately one hour. There are no restrictions for this procedure.  INSTRUCTIONS FOR THE CATH:    [facility] [address] [place] [address] [place] [address] Dept: [phone] Loc: [place]  04/09/2016  You are scheduled for a Cardiac Catheterization on Friday, April 13 with Dr. Harrell Gave End.  1. Please arrive at the [address] [facility] (Main Entrance A) at [facility]: [place], [place] [address] at 8:30 AM (two hours before your procedure to ensure your preparation). Free valet parking service is available.   Special note: Every effort is made to have your procedure done on time. Please understand that emergencies sometimes delay scheduled procedures.  2. Diet: Do not eat or drink anything after midnight prior to your procedure except sips of water to take medications.  3. Labs: You will need to have blood drawn on Monday, April 9 at [facility] at [facility]. [address]  Open: 7:30am - 5pm    Phone: [phone]. You do not need to be fasting.  4. Medication instructions in preparation for your procedure:  DO NOT TAKE METFORMIN OR GLIPIZIDE ON THE MORNING OF YOUR CATH    On the morning of your procedure, take your Plavix/Clopidogrel, Aspirin, and any morning medicines NOT listed above.  You may use sips of water.  5. Plan for one night stay--bring personal belongings. 6. Bring a  current list of your medications and current insurance cards. 7. You MUST have a responsible person to drive you home. 8. Someone MUST be with you the first 24 hours after you arrive home or your discharge will be delayed. 9. Please wear clothes that are easy to get on and off and wear slip-on shoes.  Thank you for allowing [place] to care for you!   -- [facility]  Follow-Up: Your physician recommends that you schedule a follow-up appointment in: WILL BE SET UP AT DISCHARGE   Any Other Special Instructions Will Be Listed Below (If Applicable).  Coronary Angiogram With Stent Coronary angiogram with stent placement is a procedure to widen or open a narrow blood vessel of the heart (coronary artery). Arteries may become blocked by cholesterol buildup (plaques) in the lining or wall. When a coronary artery becomes partially blocked, blood flow to that area decreases. This may lead to chest pain or a heart attack (myocardial infarction). A stent is a small piece of metal that looks like mesh or a spring. Stent placement may be done as treatment for a heart attack or right after a coronary angiogram in which a blocked artery is found. Let your health care provider know about:  Any allergies you have.  All medicines you are taking, including vitamins, herbs, eye drops, creams, and over-the-counter medicines.  Any problems you or family members have had with anesthetic medicines.  Any blood disorders you have.  Any surgeries you have had.  Any medical conditions you have.  Whether you are pregnant or may be pregnant. What are the risks? Generally, this is a safe procedure. However, problems may occur, including:  Damage to the heart or its blood vessels.  A return of blockage.  Bleeding, infection, or bruising at the insertion site.  A collection of blood under the skin (hematoma) at the insertion site.  A blood clot in another part of the  body.  Kidney injury.  Allergic reaction to the dye or contrast that is used.  Bleeding into the abdomen (retroperitoneal bleeding). What happens before the procedure? Staying hydrated  Follow instructions from your health care provider about hydration, which may include:  Up to 2 hours before the procedure - you may continue to drink clear liquids, such as water, clear fruit juice, black coffee, and plain tea. Eating and drinking restrictions  Follow instructions from your health care provider about eating and drinking, which may include:  8 hours before the procedure - stop eating heavy meals or foods such as meat, fried foods, or fatty foods.  6 hours before the procedure - stop eating light meals or foods, such as toast or cereal.  2 hours before the procedure - stop drinking clear liquids. Ask your health care provider about:  Changing or stopping your regular medicines. This is especially important if you are taking diabetes medicines or blood thinners.  Taking medicines such as ibuprofen. These medicines can thin your blood. Do not take these medicines before your procedure if your health care provider instructs you not to. Generally, aspirin is recommended before a procedure of passing a small, thin tube (catheter) through a blood vessel and into the heart (cardiac catheterization). What happens during the procedure?  An IV tube will be inserted into one of your veins.  You will be given one or more of the following:  A medicine to help you relax (sedative).  A medicine to numb the area where the catheter will be inserted into an artery (local anesthetic).  To reduce your risk of infection:  Your health care team will wash or sanitize their hands.  Your skin will be washed with soap.  Hair may be removed from the area where the catheter will be inserted.  Using a guide wire, the catheter will be inserted into an artery. The location may be in your groin, in your  wrist, or in the fold of your arm (near your elbow).  A type of X-ray (fluoroscopy) will be used to help guide the catheter to the opening of the arteries in the heart.  A dye will be injected into the catheter, and X-rays will be taken. The dye will help to show where any narrowing or blockages are located in the arteries.  A tiny wire will be guided to the blocked spot, and a balloon will be inflated to make the artery wider.  The stent will be expanded and will crush the plaques into the wall of the vessel. The stent will hold the area open and improve the blood flow. Most stents have a drug coating to reduce the risk of the stent narrowing over time.  The artery may be made wider using a drill, laser, or other tools to remove plaques.  When the blood flow is better, the catheter will be removed. The lining of the artery will grow over the stent, which stays where it was placed. This procedure may vary among health care providers and hospitals. What happens after the procedure?  If the procedure is done through the leg, you will be kept in bed lying flat for about 6 hours. You will be instructed to not bend and not cross your legs.  The insertion site will be checked frequently.  The pulse in your foot or wrist will be checked frequently.  You may have additional blood tests, X-rays, and a test that records  the electrical activity of your heart (electrocardiogram, or ECG). This information is not intended to replace advice given to you by your health care provider. Make sure you discuss any questions you have with your health care provider. Document Released: 06/24/2002 Document Revised: 08/18/2015 Document Reviewed: 07/24/2015 Elsevier Interactive Patient Education  2017 [place].  Echocardiogram An echocardiogram, or echocardiography, uses sound waves (ultrasound) to produce an image of your heart. The echocardiogram is simple, painless, obtained within a short period of time,  and offers valuable information to your health care provider. The images from an echocardiogram can provide information such as:  Evidence of coronary artery disease (CAD).  Heart size.  Heart muscle function.  Heart valve function.  Aneurysm detection.  Evidence of a past heart attack.  Fluid buildup around the heart.  Heart muscle thickening.  Assess heart valve function. Tell a health care provider about:  Any allergies you have.  All medicines you are taking, including vitamins, herbs, eye drops, creams, and over-the-counter medicines.  Any problems you or family members have had with anesthetic medicines.  Any blood disorders you have.  Any surgeries you have had.  Any medical conditions you have.  Whether you are pregnant or may be pregnant. What happens before the procedure? No special preparation is needed. Eat and drink normally. What happens during the procedure?  In order to produce an image of your heart, gel will be applied to your chest and a wand-like tool (transducer) will be moved over your chest. The gel will help transmit the sound waves from the transducer. The sound waves will harmlessly bounce off your heart to allow the heart images to be captured in real-time motion. These images will then be recorded.  You may need an IV to receive a medicine that improves the quality of the pictures. What happens after the procedure? You may return to your normal schedule including diet, activities, and medicines, unless your health care provider tells you otherwise. This information is not intended to replace advice given to you by your health care provider. Make sure you discuss any questions you have with your health care provider. Document Released: 12/16/1999 Document Revised: 08/06/2015 Document Reviewed: 08/25/2012 Elsevier Interactive Patient Education  2017 [employer].   If you need a refill on your cardiac medications before your next appointment,  please call your pharmacy.      Jarrett Soho, [place]  04/09/2016 1:06 PM    [facility], [place], [place]  [address] Phone: [phone]; Fax: [phone]

## 2016-04-16 ENCOUNTER — Encounter (HOSPITAL_COMMUNITY): Payer: Self-pay | Admitting: Internal Medicine

## 2016-04-16 ENCOUNTER — Telehealth: Payer: Self-pay | Admitting: Internal Medicine

## 2016-04-16 MED FILL — Adenosine IV Soln 12 MG/4ML: INTRAVENOUS | Qty: 4 | Status: AC

## 2016-04-16 NOTE — Telephone Encounter (Signed)
Pt's wife advised pt should have echocardiogram done, see cath report 04/13/16.

## 2016-04-16 NOTE — Telephone Encounter (Signed)
New Message   Pt wife calling regarding pt echo appt on 4/19. Per wife it was scheduled as a precautionary appt, and she just wants to know if it is still needed. Requesting call back

## 2016-04-16 NOTE — Telephone Encounter (Signed)
LMTCB

## 2016-04-16 NOTE — Telephone Encounter (Signed)
Patient wife returning your call, thanks.

## 2016-04-18 ENCOUNTER — Institutional Professional Consult (permissible substitution) (INDEPENDENT_AMBULATORY_CARE_PROVIDER_SITE_OTHER): Payer: BLUE CROSS/BLUE SHIELD | Admitting: Surgery

## 2016-04-18 ENCOUNTER — Other Ambulatory Visit: Payer: Self-pay | Admitting: *Deleted

## 2016-04-18 ENCOUNTER — Encounter: Payer: Self-pay | Admitting: Surgery

## 2016-04-18 VITALS — BP 105/69 | HR 85 | Resp 20 | Ht 69.0 in | Wt 240.0 lb

## 2016-04-18 DIAGNOSIS — I25110 Atherosclerotic heart disease of native coronary artery with unstable angina pectoris: Secondary | ICD-10-CM

## 2016-04-18 DIAGNOSIS — I2510 Atherosclerotic heart disease of native coronary artery without angina pectoris: Secondary | ICD-10-CM

## 2016-04-19 ENCOUNTER — Encounter (HOSPITAL_COMMUNITY): Payer: Self-pay

## 2016-04-19 ENCOUNTER — Ambulatory Visit (HOSPITAL_COMMUNITY)
Admission: RE | Admit: 2016-04-19 | Discharge: 2016-04-19 | Disposition: A | Discharge status: Home / Self Care | Payer: BLUE CROSS/BLUE SHIELD | Source: Ambulatory Visit | Attending: Surgery | Admitting: Surgery

## 2016-04-19 ENCOUNTER — Ambulatory Visit (HOSPITAL_BASED_OUTPATIENT_CLINIC_OR_DEPARTMENT_OTHER)
Admission: RE | Admit: 2016-04-19 | Discharge: 2016-04-19 | Disposition: A | Discharge status: Home / Self Care | Payer: BLUE CROSS/BLUE SHIELD | Source: Ambulatory Visit | Attending: Surgery | Admitting: Surgery

## 2016-04-19 ENCOUNTER — Encounter: Payer: Self-pay | Admitting: Surgery

## 2016-04-19 ENCOUNTER — Other Ambulatory Visit: Payer: Self-pay

## 2016-04-19 ENCOUNTER — Ambulatory Visit (HOSPITAL_BASED_OUTPATIENT_CLINIC_OR_DEPARTMENT_OTHER): Payer: BLUE CROSS/BLUE SHIELD

## 2016-04-19 ENCOUNTER — Encounter (HOSPITAL_COMMUNITY)
Admission: RE | Admit: 2016-04-19 | Discharge: 2016-04-19 | Disposition: A | Discharge status: Home / Self Care | Payer: BLUE CROSS/BLUE SHIELD | Source: Ambulatory Visit | Attending: Surgery | Admitting: Surgery

## 2016-04-19 DIAGNOSIS — I10 Essential (primary) hypertension: Secondary | ICD-10-CM | POA: Diagnosis not present

## 2016-04-19 DIAGNOSIS — I252 Old myocardial infarction: Secondary | ICD-10-CM | POA: Insufficient documentation

## 2016-04-19 DIAGNOSIS — R0989 Other specified symptoms and signs involving the circulatory and respiratory systems: Secondary | ICD-10-CM | POA: Diagnosis not present

## 2016-04-19 DIAGNOSIS — Z01812 Encounter for preprocedural laboratory examination: Secondary | ICD-10-CM | POA: Diagnosis not present

## 2016-04-19 DIAGNOSIS — I2510 Atherosclerotic heart disease of native coronary artery without angina pectoris: Secondary | ICD-10-CM

## 2016-04-19 DIAGNOSIS — I25110 Atherosclerotic heart disease of native coronary artery with unstable angina pectoris: Secondary | ICD-10-CM | POA: Diagnosis not present

## 2016-04-19 DIAGNOSIS — I071 Rheumatic tricuspid insufficiency: Secondary | ICD-10-CM | POA: Diagnosis not present

## 2016-04-19 DIAGNOSIS — Z01818 Encounter for other preprocedural examination: Secondary | ICD-10-CM | POA: Diagnosis not present

## 2016-04-19 DIAGNOSIS — Z01810 Encounter for preprocedural cardiovascular examination: Secondary | ICD-10-CM | POA: Diagnosis not present

## 2016-04-19 DIAGNOSIS — Z8249 Family history of ischemic heart disease and other diseases of the circulatory system: Secondary | ICD-10-CM | POA: Diagnosis not present

## 2016-04-19 DIAGNOSIS — E119 Type 2 diabetes mellitus without complications: Secondary | ICD-10-CM | POA: Diagnosis not present

## 2016-04-19 DIAGNOSIS — I371 Nonrheumatic pulmonary valve insufficiency: Secondary | ICD-10-CM | POA: Diagnosis not present

## 2016-04-19 DIAGNOSIS — Z87891 Personal history of nicotine dependence: Secondary | ICD-10-CM | POA: Diagnosis not present

## 2016-04-19 DIAGNOSIS — Z6835 Body mass index (BMI) 35.0-35.9, adult: Secondary | ICD-10-CM | POA: Insufficient documentation

## 2016-04-19 DIAGNOSIS — E669 Obesity, unspecified: Secondary | ICD-10-CM | POA: Insufficient documentation

## 2016-04-19 DIAGNOSIS — I200 Unstable angina: Secondary | ICD-10-CM

## 2016-04-19 DIAGNOSIS — I6523 Occlusion and stenosis of bilateral carotid arteries: Secondary | ICD-10-CM | POA: Insufficient documentation

## 2016-04-19 HISTORY — DX: Unspecified osteoarthritis, unspecified site: M19.90

## 2016-04-19 HISTORY — DX: Anxiety disorder, unspecified: F41.9

## 2016-04-19 HISTORY — DX: Gastro-esophageal reflux disease without esophagitis: K21.9

## 2016-04-19 LAB — COMPREHENSIVE METABOLIC PANEL
ALBUMIN: 4 g/dL (ref 3.5–5.0)
ALT: 30 U/L (ref 17–63)
ANION GAP: 14 (ref 5–15)
AST: 25 U/L (ref 15–41)
Alkaline Phosphatase: 69 U/L (ref 38–126)
BUN: 22 mg/dL — ABNORMAL HIGH (ref 6–20)
CHLORIDE: 103 mmol/L (ref 101–111)
CO2: 21 mmol/L — AB (ref 22–32)
Calcium: 9.1 mg/dL (ref 8.9–10.3)
Creatinine, Ser: 0.85 mg/dL (ref 0.61–1.24)
GFR calc Af Amer: >60 mL/min (ref >60)
GFR calc non Af Amer: >60 mL/min (ref >60)
GLUCOSE: 179 mg/dL — AB (ref 65–99)
POTASSIUM: 4 mmol/L (ref 3.5–5.1)
SODIUM: 138 mmol/L (ref 135–145)
Total Bilirubin: 0.9 mg/dL (ref 0.3–1.2)
Total Protein: 6.8 g/dL (ref 6.5–8.1)

## 2016-04-19 LAB — CBC
HCT: 41.3 % (ref 39.0–52.0)
Hemoglobin: 14.8 g/dL (ref 13.0–17.0)
MCH: 32 pg (ref 26.0–34.0)
MCHC: 35.8 g/dL (ref 30.0–36.0)
MCV: 89.2 fL (ref 78.0–100.0)
Platelets: 239 10*3/uL (ref 150–400)
RBC: 4.63 MIL/uL (ref 4.22–5.81)
RDW: 12.8 % (ref 11.5–15.5)
WBC: 10 10*3/uL (ref 4.0–10.5)

## 2016-04-19 LAB — VAS US DOPPLER PRE CABG
LCCAPSYS: 88 cm/s
LEFT ECA DIAS: -13 cm/s
LEFT VERTEBRAL DIAS: 14 cm/s
LICADDIAS: -10 cm/s
LICAPDIAS: -36 cm/s
LICAPSYS: -159 cm/s
Left CCA dist dias: -13 cm/s
Left CCA dist sys: -98 cm/s
Left CCA prox dias: 11 cm/s
Left ICA dist sys: -36 cm/s
RIGHT ECA DIAS: -7 cm/s
RIGHT VERTEBRAL DIAS: -6 cm/s
Right CCA prox dias: 13 cm/s
Right CCA prox sys: 78 cm/s
Right cca dist sys: -74 cm/s

## 2016-04-19 LAB — ECHOCARDIOGRAM COMPLETE
HEIGHTINCHES: 69 in
WEIGHTICAEL: 3848 [oz_av]

## 2016-04-19 LAB — TYPE AND SCREEN
ABO/RH(D): A NEG
ANTIBODY SCREEN: NEGATIVE

## 2016-04-19 LAB — PULMONARY FUNCTION TEST
DL/VA % PRED: 106 %
DL/VA: 4.85 ml/min/mmHg/L
DLCO cor % pred: 86 %
DLCO cor: 26.86 ml/min/mmHg
DLCO unc % pred: 87 %
DLCO unc: 27.01 ml/min/mmHg
FEF 25-75 Post: 3.02 L/sec
FEF 25-75 Pre: 4.02 L/sec
FEF2575-%CHANGE-POST: -24 %
FEF2575-%Pred-Post: 100 %
FEF2575-%Pred-Pre: 133 %
FEV1-%Change-Post: -4 %
FEV1-%PRED-PRE: 92 %
FEV1-%Pred-Post: 88 %
FEV1-PRE: 3.3 L
FEV1-Post: 3.15 L
FEV1FVC-%CHANGE-POST: -1 %
FEV1FVC-%Pred-Pre: 111 %
FEV6-%Change-Post: -3 %
FEV6-%PRED-POST: 84 %
FEV6-%Pred-Pre: 86 %
FEV6-PRE: 3.9 L
FEV6-Post: 3.77 L
FEV6FVC-%PRED-PRE: 104 %
FEV6FVC-%Pred-Post: 104 %
FVC-%Change-Post: -3 %
FVC-%PRED-POST: 80 %
FVC-%Pred-Pre: 83 %
FVC-Post: 3.77 L
FVC-Pre: 3.9 L
POST FEV1/FVC RATIO: 84 %
PRE FEV6/FVC RATIO: 100 %
Post FEV6/FVC ratio: 100 %
Pre FEV1/FVC ratio: 85 %
RV % PRED: 55 %
RV: 1.19 L
TLC % pred: 78 %
TLC: 5.33 L

## 2016-04-19 LAB — BLOOD GAS, ARTERIAL
ACID-BASE DEFICIT: 0.8 mmol/L (ref 0.0–2.0)
BICARBONATE: 23.2 mmol/L (ref 20.0–28.0)
DRAWN BY: 421801
FIO2: 21
O2 Saturation: 97.7 %
PATIENT TEMPERATURE: 98.6
pCO2 arterial: 37.4 mmHg (ref 32.0–48.0)
pH, Arterial: 7.409 (ref 7.350–7.450)
pO2, Arterial: 103 mmHg (ref 83.0–108.0)

## 2016-04-19 LAB — URINALYSIS, ROUTINE W REFLEX MICROSCOPIC
Bilirubin Urine: NEGATIVE
Glucose, UA: 50 mg/dL — AB
HGB URINE DIPSTICK: NEGATIVE
Ketones, ur: NEGATIVE mg/dL
Leukocytes, UA: NEGATIVE
Nitrite: NEGATIVE
Protein, ur: NEGATIVE mg/dL
SPECIFIC GRAVITY, URINE: 1.027 (ref 1.005–1.030)
pH: 5 (ref 5.0–8.0)

## 2016-04-19 LAB — PROTIME-INR
INR: 1
Prothrombin Time: 13.2 seconds (ref 11.4–15.2)

## 2016-04-19 LAB — ABO/RH: ABO/RH(D): A NEG

## 2016-04-19 LAB — SURGICAL PCR SCREEN
MRSA, PCR: POSITIVE — AB
Staphylococcus aureus: POSITIVE — AB

## 2016-04-19 LAB — APTT: APTT: 24 s (ref 24–36)

## 2016-04-19 LAB — GLUCOSE, CAPILLARY: Glucose-Capillary: 160 mg/dL — ABNORMAL HIGH (ref 65–99)

## 2016-04-19 MED ORDER — ALBUTEROL SULFATE (2.5 MG/3ML) 0.083% IN NEBU
2.5000 mg | INHALATION_SOLUTION | Freq: Once | RESPIRATORY_TRACT | Status: AC
  Administered 2016-04-19: 2.5 mg via RESPIRATORY_TRACT

## 2016-04-19 NOTE — Progress Notes (Signed)
Pre-op Cardiac Surgery  Carotid Findings:  Right - 1% to 39% ICA stenosis. Left - 40% to 59% ICA stenosis. Bilateral  Upper Extremity Right Left  Brachial Pressures 99 Triphasic 99 Triphasic  Radial Waveforms Triphasic Triphasic  Ulnar Waveforms Triphasic Triphasic  Palmar Arch (Allen's Test) Normal Normal   Findings:  Doppler waveforms remained normal bilaterally with both radial and ulnar compressions.    Lower  Extremity Right Left  Dorsalis Pedis 126 Triphasic 113 Triphasic  Posterior Tibial 114 Triphasic 130 Triphasic  Ankle/Brachial Indices 1.3 1.3    Findings:  ABIs and Doppler waveforms indicate normal arterial flow bilaterally at rest.

## 2016-04-19 NOTE — Pre-Procedure Instructions (Signed)
    Mort Bruns  04/19/2016      [place] [address] [place] [address] [place] [address] Phone: [phone] Fax: [phone]    Your procedure is scheduled on 04/23/16.  Report to [facility] Admitting at 530 A.M.  Call this number if you have problems the morning of surgery:  [phone]   Remember:  Do not eat food or drink liquids after midnight.  Take these medicines the morning of surgery with A SIP OF WATER ---aspirin,lexapro,neurontin,imdur,metoprolol,protonix   Do not wear jewelry, make-up or nail polish.  Do not wear lotions, powders, or perfumes, or deoderant.  Do not shave 48 hours prior to surgery.  Men may shave face and neck.  Do not bring valuables to the hospital.  [facility] is not responsible for any belongings or valuables.  Contacts, dentures or bridgework may not be worn into surgery.  Leave your suitcase in the car.  After surgery it may be brought to your room.  For patients admitted to the hospital, discharge time will be determined by your treatment team.  Patients discharged the day of surgery will not be allowed to drive home.   Name and phone number of your driver:    Special instructions:  Do not take anti inflammatories 5 days before surgery  Please read over the following fact sheets that you were given. MRSA Information

## 2016-04-19 NOTE — Progress Notes (Signed)
PCP is Odette Fraction, MD Referring Provider is End, Harrell Gave, MD  Chief Complaint  Patient presents with  . Coronary Artery Disease    Surgical eval, Cardiac Cath 04/13/16, ECHO 4/9/208     HPI:  The patient is a 56 year old gentleman with poorly controlled DM with neuropathy, HTN, hyperlipidemia, family history of premature CAD who suffered an inferior-posterior STEMI in 04/2012 treated with a DES to the mid LCX. He had some residual disease treated medically. He has not returned for medical follow up until recently and was off of his medications for a while due to losing his insurance and not being able to afford them. 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 He ruled out for MI and had an outpt stress test that was a high risk study with an EF of < 30%. He underwent cath on 04/13/2016 showing severe 3-vessel CAD with an LVEF of 35-40%. He continues to have intermittent deep sternal pain that feels like a knife going through his chest. It occurs with exertion and at rest. He has some chronic sensitivity of the skin on his anterior chest that sounds like neuropathy but the deep pain is different and recent. He denies shortness of breath or fatigue although his wife says that he is very tired at the end of the day and sleeps a lot.    Past Medical History:  Diagnosis Date  . CAD (coronary artery disease) 04/07/2012   Inferoposterior STEMI s/p DES-mid LCx  . Diabetes mellitus   . Diabetic neuropathy ([place])   . High cholesterol   . Hypertension   . Myocardial infarction ([place])   . Obesity   . Pancreatitis    a. mild by CT 06/2011  . Peptic ulcer disease    a. 06/2009 EGD: multiple gastric and duodenal ulcers.    Past Surgical History:  Procedure Laterality Date  . CORONARY ANGIOPLASTY WITH STENT PLACEMENT  04/07/2012   70% prox LAD, 70-80% ostial diagonal, 70% mid-distal LAD, 80% prox OM1, mid LCx totally  occluded just distal to OM1 s/p DES, occluded RCA; severe inferior HK, LVEF 45%  . ESOPHAGOGASTRODUODENOSCOPY  06/18/2011   NL ESOPHAGUS/UlcerATED LESIONS/ SUPERFICIAL Ulcers  . INTRAVASCULAR PRESSURE WIRE/FFR STUDY N/A 04/13/2016   Procedure: Intravascular Pressure Wire/FFR Study;  Surgeon: Nelva Bush, MD;  Location: [place] [facility];  Service: Cardiovascular;  Laterality: N/A;  . LEFT HEART CATH AND CORONARY ANGIOGRAPHY N/A 04/13/2016   Procedure: Left Heart Cath and Coronary Angiography;  Surgeon: Nelva Bush, MD;  Location: [place] [facility];  Service: Cardiovascular;  Laterality: N/A;  . LEFT HEART CATHETERIZATION WITH CORONARY ANGIOGRAM N/A 04/07/2012   Procedure: LEFT HEART CATHETERIZATION WITH CORONARY ANGIOGRAM;  Surgeon: Sherren Mocha, MD;  Location: [facility] CATH LAB;  Service: Cardiovascular;  Laterality: N/A;  . PERCUTANEOUS CORONARY STENT INTERVENTION (PCI-S)  04/07/2012   Procedure: PERCUTANEOUS CORONARY STENT INTERVENTION (PCI-S);  Surgeon: Sherren Mocha, MD;  Location: [facility] CATH LAB;  Service: Cardiovascular;;    Family History  Problem Relation Age of Onset  . Heart attack Father     MI x 2 in late 50's, currently 44's  . CVA Sister     late 52s  . CAD Maternal Uncle     MI at 37    Social History Social History  Substance Use Topics  . Smoking status: Former Smoker    Years: 20.00    Quit date: 2000  .  Smokeless tobacco: Former Systems developer    Quit date: 01/01/1998  . Alcohol use No  Married and lives with his wife and two children. 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  Current Outpatient Prescriptions  Medication Sig Dispense Refill  . aspirin EC 81 MG EC tablet Take 1 tablet (81 mg total) by mouth daily.    Marland Kitchen atorvastatin (LIPITOR) 40 MG tablet Take 1 tablet (40 mg total) by mouth daily. 30 tablet 5  . docusate sodium (DULCOLAX PINK STOOL SOFTENER) 100 MG capsule Take 200 mg by mouth at bedtime.     Marland Kitchen escitalopram (LEXAPRO) 10 MG tablet Take 10  mg by mouth at bedtime.   0  . gabapentin (NEURONTIN) 300 MG capsule Take 300 mg by mouth 3 (three) times daily.   1  . glipiZIDE (GLUCOTROL) 10 MG tablet TAKE ONE TABLET BY MOUTH TWICE DAILY BEFORE A MEAL 60 tablet 3  . isosorbide mononitrate (IMDUR) 30 MG 24 hr tablet Take 1 tablet (30 mg total) by mouth daily. (Patient taking differently: Take 30 mg by mouth daily. Taking 1/2 tablet (15 mg) daily) 30 tablet 5  . lisinopril (PRINIVIL,ZESTRIL) 10 MG tablet Take 1 tablet by mouth daily.  0  . metFORMIN (GLUCOPHAGE) 1000 MG tablet Take 1 tablet (1,000 mg total) by mouth 2 (two) times daily. 60 tablet 3  . metoprolol tartrate (LOPRESSOR) 25 MG tablet Take 1 tablet (25 mg total) by mouth 2 (two) times daily. 60 tablet 1  . nitroGLYCERIN (NITROSTAT) 0.4 MG SL tablet Place 1 tablet (0.4 mg total) under the tongue every 5 (five) minutes as needed for chest pain. X 3 doses 25 tablet prn  . oxyCODONE-acetaminophen (PERCOCET) 7.5-325 MG tablet Take 1 tablet by mouth every 4 (four) hours as needed for severe pain. 30 tablet 0  . pantoprazole (PROTONIX) 40 MG tablet Take 1 tablet (40 mg total) by mouth daily. 30 tablet 11   No current facility-administered medications for this visit.     No Known Allergies  Review of Systems  Constitutional: Positive for fatigue.  HENT: Negative.        Dentures  Eyes: Negative.   Respiratory: Positive for chest tightness. Negative for cough and shortness of breath.   Cardiovascular: Positive for chest pain and leg swelling. Negative for palpitations.  Gastrointestinal: Positive for abdominal pain and constipation.  Endocrine: Negative.   Genitourinary:       Kidney stones  Musculoskeletal: Positive for arthralgias.  Skin: Negative.   Neurological:       Neuropathy in feet  Hematological: Negative.   Psychiatric/Behavioral:       Recent stress with his business    BP 105/69   Pulse 85   Resp 20   Ht 5\' 9"  (1.753 m)   Wt 240 lb (108.9 kg)   SpO2 98%  Comment: RA  BMI 35.44 kg/m  Physical Exam  Constitutional: He is oriented to person, place, and time. No distress.  Obese  HENT:  Head: Normocephalic and atraumatic.  Mouth/Throat: Oropharynx is clear and moist.  Eyes: EOM are normal. Pupils are equal, round, and reactive to light.  Neck: Normal range of motion. Neck supple. No JVD present. No thyromegaly present.  Cardiovascular: Normal rate, regular rhythm, normal heart sounds and intact distal pulses.   No murmur heard. Pulmonary/Chest: Effort normal and breath sounds normal. No respiratory distress.  Abdominal: Soft. Bowel sounds are normal. There is no tenderness.  obese  Musculoskeletal: Normal range  of motion. He exhibits no edema.  Lymphadenopathy:    He has no cervical adenopathy.  Neurological: He is alert and oriented to person, place, and time. He has normal strength. No cranial nerve deficit or sensory deficit.  Skin: Skin is warm and dry.  Psychiatric: He has a normal mood and affect.     Diagnostic Tests:  Lemond Griffee  Cardiac catheterization  Order# [id number]  Reading physician: Nelva Bush, MD Ordering physician: Nelva Bush, MD Study date: 04/13/16  Physicians   Panel Physicians Referring Physician Case Authorizing Physician  Nelva Bush, MD (Primary)    Procedures   Intravascular Pressure Wire/FFR Study  Left Heart Cath and Coronary Angiography  Conclusion   Conclusions: 1. Significant 3-vessel coronary artery disease, including sequential 50-70% ostial, proximal, and mid LAD stenoses, which are hemodynamically significant (resting Pd/Pa 0.72), 50% in-stent restenosis in mid LCx as well as 80% 90% stenoses involving OM2 and distal LCx, and chronic total occlusion of mid RCA with distal vessel filling via bridging and left-to-right collaterals. 2. Upper normal left ventricular filling pressure. 3. Moderately reduced left ventricular contraction with inferior akinesis (LVEF  35-40%).  Recommendations: 1. Cardiac surgery consultation for CABG, given significant 3-vessel coronary artery disease, reduced LV function, and diabetes mellitus. 2. Aggressive secondary prevention, including escalation of statin therapy. 3. Start isosorbide mononitrate 30 mg daily; if patient experiences side effects, he can decrease the dose to 15 mg daily. 4. Proceed with transthoracic echocardiogram, as previously ordered.  Nelva Bush, MD [facility] Pager: [phone]   Indications   Atypical chest pain [R07.89 (ICD-10-CM)]  Abnormal stress test [R94.39 (ICD-10-CM)]  Procedural Details/Technique   Technical Details Indication: 56 y.o. year-old man with history of CAD s/p inferolateral STEMI in 2014 s/p DES to the mid LCx (also with moderate to severe LAD disease and chronic total occlusion of RCA at that time), ischemic cardiomyopathy, diabetes mellitus, hypertension, hyperlipidemia, and peptic ulcer disease, who presents for evaluation of atypical chest pain. He has noted soreness of the chest wall (particularly with palpation) as well as occasional stabbing chest pains over the last month. He presented to the ED last month a rules out for acute MI. Subsequent outpatient myocardial perfusion stress test was high risk with large inferolateral infarct, as wall as anterior ischemia/scar. He has therefore been referred for [facility] with possible PCI.  GFR: 104 ml/min  Procedure: The risks, benefits, complications, treatment options, and expected outcomes were discussed with the patient. The patient and/or family concurred with the proposed plan, giving informed consent. The patient was brought to the cath lab after IV hydration was begun and oral premedication was given. The patient was further sedated with Versed and Fentanyl. The right wrist was assessed with a modified Allens test which was normal. The right wrist was prepped and draped in a sterile fashion. 1% lidocaine was  used for local anesthesia. Using the modified Seldinger access technique, a 30F slender Glidesheath was placed in the right radial artery. 3 mg Verapamil was given through the sheath. Heparin 5,000 units were administered.  Selective coronary angiography was performed using 33F JL3.5 and JR4 catheters to engage the left and right coronary arteries, respectively. Left heart catheterization was performed using a 33F pigtail catheter. Left ventriculogram was performed with a power injection of contrast.  FFR of LAD: Heparin was used for anticoagulation. The left coronary artery was engaged with a 33F EBU3 guide catheter. A Comet pressure wire was advanced into the distal LAD, where resting Pd/Pa was  obtained (Pd/Pa 0.72). Given markedly positive Pd/Pa, adenosine was not administered. Final angiogram demonstrates stable appearance of the left coronary artery.  At the end of the procedure, the radial artery sheath was removed and a TR band applied to achieve patent hemostasis. There were no immediate complications. The patient was taken to the recovery area in stable condition.  Contrast used: 100 mL Isovue Fluoroscopy time: 7.1 min Radiation dose: 780 mGy   Estimated blood loss <50 mL.  During this procedure the patient was administered the following to achieve and maintain moderate conscious sedation: Versed 1 mg, Fentanyl 50 mcg, while the patient's heart rate, blood pressure, and oxygen saturation were continuously monitored. The period of conscious sedation was 51 minutes, of which I was present face-to-face 100% of this time.    Complications   Complications documented before study signed (04/13/2016 6:39 PM EDT)    No complications were associated with this study.  Documented by Nelva Bush, MD - 04/13/2016 6:32 PM EDT    Coronary Findings   Dominance: Right  Left Main  Vessel is large.  Left Anterior Descending  Vessel is large.  Ost LAD lesion, 50% stenosed. The lesion is  eccentric. The lesion is moderately calcified.  Prox LAD lesion, 70% stenosed. The lesion is irregular.  Mid LAD lesion, 70% stenosed. The lesion is focal.  Dist LAD lesion, 30% stenosed.  First Diagonal Branch  Vessel is small in size. There is mild disease in the vessel.  Second Diagonal Branch  Vessel is small in size. There is mild disease in the vessel.  Third Diagonal Branch  Vessel is small in size.  Ramus Intermedius  Vessel is small.  Left Circumflex  Vessel is large.  Prox Cx lesion, 30% stenosed. The lesion is eccentric.  Mid Cx lesion, 50% stenosed. The lesion was previously treated using a drug eluting stent over 2 years ago. Previously placed stent displays restenosis.  Dist Cx lesion, 80% stenosed.  First Obtuse Marginal Branch  Vessel is moderate in size.  Second Obtuse Marginal Branch  Vessel is moderate in size.  2nd Mrg lesion, 90% stenosed.  Third Obtuse Marginal Branch  Vessel is small in size.  Right Coronary Artery  Vessel is moderate in size. Mid RCA filled by collaterals from Prox RCA.  Mid RCA lesion, 100% stenosed. The lesion is chronically occluded with bridging and left-to-right collateral flow.  Right Posterior Descending Artery  Vessel is small in size.  Right Posterior Atrioventricular Branch  Vessel is moderate in size.  First Right Posterolateral  1st RPLB filled by collaterals from Dist Cx.  Wall Motion              Left Heart   Left Ventricle There is moderate left ventricular systolic dysfunction. LVEF 35-40%. Left ventricular filling pressure is upper normal; LVEDP 15-18 mmHg.    Aortic Valve There is no aortic valve stenosis.    Coronary Diagrams   Diagnostic Diagram       Implants     No implant documentation for this case.  PACS Images   Show images for Cardiac catheterization   Link to Procedure Log   Procedure Log    Hemo Data    Most Recent Value  AO Systolic Pressure 027 mmHg  AO Diastolic Pressure 64 mmHg   AO Mean 90 mmHg  LV Systolic Pressure 253 mmHg  LV Diastolic Pressure 7 mmHg  LV EDP 15 mmHg  Arterial Occlusion Pressure Extended Systolic Pressure 664 mmHg  Arterial Occlusion  Pressure Extended Diastolic Pressure 71 mmHg  Arterial Occlusion Pressure Extended Mean Pressure 95 mmHg  Left Ventricular Apex Extended Systolic Pressure 915 mmHg  Left Ventricular Apex Extended Diastolic Pressure 9 mmHg  Left Ventricular Apex Extended EDP Pressure 16 mmHg    Impression:  This 56 year old gentleman has severe 3-vessel CAD with moderate LV dysfunction with prior inferior-posterior MI in 2014 treated with a DES. He now has recurrent chest pain with exertion and at rest and fatigue and I agree that the best treatment is CABG. I discussed the operative procedure with the patient and his wife including alternatives, benefits and risks; including but not limited to bleeding, blood transfusion, infection, stroke, myocardial infarction, graft failure, heart block requiring a permanent pacemaker, organ dysfunction, and death.  Shamon Teti understands and agrees to proceed.  We will schedule surgery for Monday 04/23/2016. He has multiple cardiac risk factors that will require attention postop including poorly controlled DM, hyperlipidemia with elevated triglycerides and low HDL, and obesity. 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   Plan:  CABG on 04/23/2016   I spent 60 minutes performing this consultation and > 50% of this time was spent face to face counseling and coordinating the care of this patient's severe multi-vessel coronary artery disease.   Gaye Pollack, MD [facility] [phone]

## 2016-04-20 LAB — HEMOGLOBIN A1C
HEMOGLOBIN A1C: 7.8 % — AB (ref 4.8–5.6)
MEAN PLASMA GLUCOSE: 177 mg/dL

## 2016-04-22 MED ORDER — TRANEXAMIC ACID 1000 MG/10ML IV SOLN
1.5000 mg/kg/h | INTRAVENOUS | Status: AC
  Administered 2016-04-23: 1.5 mg/kg/h via INTRAVENOUS
  Filled 2016-04-22: qty 25

## 2016-04-22 MED ORDER — POTASSIUM CHLORIDE 2 MEQ/ML IV SOLN
80.0000 meq | INTRAVENOUS | Status: DC
  Filled 2016-04-22: qty 40

## 2016-04-22 MED ORDER — PLASMA-LYTE 148 IV SOLN
INTRAVENOUS | Status: AC
  Administered 2016-04-23: 500 mL
  Filled 2016-04-22: qty 2.5

## 2016-04-22 MED ORDER — DOPAMINE-DEXTROSE 3.2-5 MG/ML-% IV SOLN
0.0000 ug/kg/min | INTRAVENOUS | Status: DC
  Filled 2016-04-22: qty 250

## 2016-04-22 MED ORDER — DEXMEDETOMIDINE HCL IN NACL 400 MCG/100ML IV SOLN
0.1000 ug/kg/h | INTRAVENOUS | Status: DC
  Filled 2016-04-22: qty 100

## 2016-04-22 MED ORDER — TRANEXAMIC ACID (OHS) BOLUS VIA INFUSION
15.0000 mg/kg | INTRAVENOUS | Status: AC
  Administered 2016-04-23: 1636.5 mg via INTRAVENOUS
  Filled 2016-04-22: qty 1637

## 2016-04-22 MED ORDER — PHENYLEPHRINE HCL 10 MG/ML IJ SOLN
30.0000 ug/min | INTRAMUSCULAR | Status: DC
  Filled 2016-04-22: qty 2

## 2016-04-22 MED ORDER — TRANEXAMIC ACID (OHS) PUMP PRIME SOLUTION
2.0000 mg/kg | INTRAVENOUS | Status: DC
  Filled 2016-04-22: qty 2.18

## 2016-04-22 MED ORDER — DEXTROSE 5 % IV SOLN
750.0000 mg | INTRAVENOUS | Status: DC
  Filled 2016-04-22: qty 750

## 2016-04-22 MED ORDER — MAGNESIUM SULFATE 50 % IJ SOLN
40.0000 meq | INTRAMUSCULAR | Status: DC
  Filled 2016-04-22: qty 10

## 2016-04-22 MED ORDER — VANCOMYCIN HCL 10 G IV SOLR
1500.0000 mg | INTRAVENOUS | Status: AC
  Administered 2016-04-23: 1500 mg via INTRAVENOUS
  Filled 2016-04-22: qty 1500

## 2016-04-22 MED ORDER — NITROGLYCERIN IN D5W 200-5 MCG/ML-% IV SOLN
2.0000 ug/min | INTRAVENOUS | Status: DC
Start: 2016-04-23 — End: 2016-04-23
  Filled 2016-04-22: qty 250

## 2016-04-22 MED ORDER — EPINEPHRINE PF 1 MG/ML IJ SOLN
0.0000 ug/min | INTRAVENOUS | Status: DC
  Filled 2016-04-22: qty 4

## 2016-04-22 MED ORDER — DEXTROSE 5 % IV SOLN
1.5000 g | INTRAVENOUS | Status: AC
  Administered 2016-04-23: 1.5 g via INTRAVENOUS
  Administered 2016-04-23: .75 g via INTRAVENOUS
  Filled 2016-04-22: qty 1.5

## 2016-04-22 MED ORDER — HEPARIN SODIUM (PORCINE) 1000 UNIT/ML IJ SOLN
INTRAMUSCULAR | Status: DC
  Filled 2016-04-22: qty 30

## 2016-04-22 MED ORDER — METOPROLOL TARTRATE 12.5 MG HALF TABLET: 12.5000 mg | ORAL_TABLET | Freq: Once | ORAL | Status: DC

## 2016-04-22 MED ORDER — SODIUM CHLORIDE 0.9 % IV SOLN
INTRAVENOUS | Status: DC
  Filled 2016-04-22: qty 2.5

## 2016-04-23 ENCOUNTER — Inpatient Hospital Stay (HOSPITAL_COMMUNITY)
Admission: RE | Disposition: A | Discharge status: Home / Self Care | Payer: Self-pay | Source: Ambulatory Visit | Attending: Surgery

## 2016-04-23 ENCOUNTER — Inpatient Hospital Stay (HOSPITAL_COMMUNITY): Payer: BLUE CROSS/BLUE SHIELD | Admitting: Certified Registered Nurse Anesthetist

## 2016-04-23 ENCOUNTER — Inpatient Hospital Stay (HOSPITAL_COMMUNITY): Payer: BLUE CROSS/BLUE SHIELD

## 2016-04-23 ENCOUNTER — Inpatient Hospital Stay (HOSPITAL_COMMUNITY)
Admission: RE | Admit: 2016-04-23 | Discharge: 2016-04-27 | DRG: 236 | Disposition: A | Discharge status: Home / Self Care | Payer: BLUE CROSS/BLUE SHIELD | Source: Ambulatory Visit | Attending: Surgery | Admitting: Surgery

## 2016-04-23 ENCOUNTER — Encounter (HOSPITAL_COMMUNITY): Payer: Self-pay | Admitting: Certified Registered Nurse Anesthetist

## 2016-04-23 DIAGNOSIS — E7800 Pure hypercholesterolemia, unspecified: Secondary | ICD-10-CM | POA: Diagnosis not present

## 2016-04-23 DIAGNOSIS — E8770 Fluid overload, unspecified: Secondary | ICD-10-CM | POA: Diagnosis not present

## 2016-04-23 DIAGNOSIS — Z8249 Family history of ischemic heart disease and other diseases of the circulatory system: Secondary | ICD-10-CM

## 2016-04-23 DIAGNOSIS — Z79899 Other long term (current) drug therapy: Secondary | ICD-10-CM | POA: Diagnosis not present

## 2016-04-23 DIAGNOSIS — I2582 Chronic total occlusion of coronary artery: Secondary | ICD-10-CM | POA: Diagnosis present

## 2016-04-23 DIAGNOSIS — Z8711 Personal history of peptic ulcer disease: Secondary | ICD-10-CM

## 2016-04-23 DIAGNOSIS — Z6835 Body mass index (BMI) 35.0-35.9, adult: Secondary | ICD-10-CM | POA: Diagnosis not present

## 2016-04-23 DIAGNOSIS — Z951 Presence of aortocoronary bypass graft: Secondary | ICD-10-CM

## 2016-04-23 DIAGNOSIS — Z7984 Long term (current) use of oral hypoglycemic drugs: Secondary | ICD-10-CM

## 2016-04-23 DIAGNOSIS — Z7982 Long term (current) use of aspirin: Secondary | ICD-10-CM

## 2016-04-23 DIAGNOSIS — I252 Old myocardial infarction: Secondary | ICD-10-CM

## 2016-04-23 DIAGNOSIS — E669 Obesity, unspecified: Secondary | ICD-10-CM | POA: Diagnosis present

## 2016-04-23 DIAGNOSIS — E785 Hyperlipidemia, unspecified: Secondary | ICD-10-CM | POA: Diagnosis not present

## 2016-04-23 DIAGNOSIS — I10 Essential (primary) hypertension: Secondary | ICD-10-CM | POA: Diagnosis present

## 2016-04-23 DIAGNOSIS — J9811 Atelectasis: Secondary | ICD-10-CM | POA: Diagnosis not present

## 2016-04-23 DIAGNOSIS — I2510 Atherosclerotic heart disease of native coronary artery without angina pectoris: Secondary | ICD-10-CM | POA: Diagnosis not present

## 2016-04-23 DIAGNOSIS — E1140 Type 2 diabetes mellitus with diabetic neuropathy, unspecified: Secondary | ICD-10-CM | POA: Diagnosis not present

## 2016-04-23 DIAGNOSIS — Z87891 Personal history of nicotine dependence: Secondary | ICD-10-CM

## 2016-04-23 DIAGNOSIS — K219 Gastro-esophageal reflux disease without esophagitis: Secondary | ICD-10-CM | POA: Diagnosis present

## 2016-04-23 DIAGNOSIS — K5900 Constipation, unspecified: Secondary | ICD-10-CM | POA: Diagnosis not present

## 2016-04-23 DIAGNOSIS — Z955 Presence of coronary angioplasty implant and graft: Secondary | ICD-10-CM | POA: Diagnosis not present

## 2016-04-23 DIAGNOSIS — J90 Pleural effusion, not elsewhere classified: Secondary | ICD-10-CM | POA: Diagnosis not present

## 2016-04-23 DIAGNOSIS — E1165 Type 2 diabetes mellitus with hyperglycemia: Secondary | ICD-10-CM | POA: Diagnosis not present

## 2016-04-23 DIAGNOSIS — I081 Rheumatic disorders of both mitral and tricuspid valves: Secondary | ICD-10-CM | POA: Diagnosis not present

## 2016-04-23 DIAGNOSIS — I255 Ischemic cardiomyopathy: Secondary | ICD-10-CM | POA: Diagnosis not present

## 2016-04-23 HISTORY — PX: ENDOVEIN HARVEST OF GREATER SAPHENOUS VEIN: SHX5059

## 2016-04-23 HISTORY — PX: TEE WITHOUT CARDIOVERSION: SHX5443

## 2016-04-23 HISTORY — PX: CORONARY ARTERY BYPASS GRAFT: SHX141

## 2016-04-23 LAB — PROTIME-INR
INR: 1.26
Prothrombin Time: 15.8 seconds — ABNORMAL HIGH (ref 11.4–15.2)

## 2016-04-23 LAB — CBC
HEMATOCRIT: 33.9 % — AB (ref 39.0–52.0)
HEMATOCRIT: 36.1 % — AB (ref 39.0–52.0)
HEMOGLOBIN: 12.2 g/dL — AB (ref 13.0–17.0)
HEMOGLOBIN: 12.8 g/dL — AB (ref 13.0–17.0)
MCH: 32 pg (ref 26.0–34.0)
MCH: 31.8 pg (ref 26.0–34.0)
MCHC: 36 g/dL (ref 30.0–36.0)
MCHC: 35.5 g/dL (ref 30.0–36.0)
MCV: 89 fL (ref 78.0–100.0)
MCV: 89.6 fL (ref 78.0–100.0)
Platelets: 196 10*3/uL (ref 150–400)
Platelets: 208 10*3/uL (ref 150–400)
RBC: 3.81 MIL/uL — ABNORMAL LOW (ref 4.22–5.81)
RBC: 4.03 MIL/uL — AB (ref 4.22–5.81)
RDW: 12.9 % (ref 11.5–15.5)
RDW: 12.9 % (ref 11.5–15.5)
WBC: 19.1 10*3/uL — AB (ref 4.0–10.5)
WBC: 20.8 10*3/uL — ABNORMAL HIGH (ref 4.0–10.5)

## 2016-04-23 LAB — POCT I-STAT, CHEM 8
BUN: 14 mg/dL (ref 6–20)
BUN: 13 mg/dL (ref 6–20)
BUN: 15 mg/dL (ref 6–20)
BUN: 13 mg/dL (ref 6–20)
BUN: 13 mg/dL (ref 6–20)
CHLORIDE: 100 mmol/L — AB (ref 101–111)
CHLORIDE: 105 mmol/L (ref 101–111)
CREATININE: 0.7 mg/dL (ref 0.61–1.24)
CREATININE: 0.6 mg/dL — AB (ref 0.61–1.24)
Calcium, Ion: 1.24 mmol/L (ref 1.15–1.40)
Calcium, Ion: 1.03 mmol/L — ABNORMAL LOW (ref 1.15–1.40)
Calcium, Ion: 1.21 mmol/L (ref 1.15–1.40)
Calcium, Ion: 0.94 mmol/L — ABNORMAL LOW (ref 1.15–1.40)
Calcium, Ion: 1.18 mmol/L (ref 1.15–1.40)
Chloride: 102 mmol/L (ref 101–111)
Chloride: 101 mmol/L (ref 101–111)
Chloride: 103 mmol/L (ref 101–111)
Creatinine, Ser: 0.6 mg/dL — ABNORMAL LOW (ref 0.61–1.24)
Creatinine, Ser: 0.8 mg/dL (ref 0.61–1.24)
Creatinine, Ser: 0.7 mg/dL (ref 0.61–1.24)
GLUCOSE: 146 mg/dL — AB (ref 65–99)
GLUCOSE: 128 mg/dL — AB (ref 65–99)
Glucose, Bld: 193 mg/dL — ABNORMAL HIGH (ref 65–99)
Glucose, Bld: 151 mg/dL — ABNORMAL HIGH (ref 65–99)
Glucose, Bld: 175 mg/dL — ABNORMAL HIGH (ref 65–99)
HCT: 35 % — ABNORMAL LOW (ref 39.0–52.0)
HEMATOCRIT: 34 % — AB (ref 39.0–52.0)
HEMATOCRIT: 29 % — AB (ref 39.0–52.0)
HEMATOCRIT: 34 % — AB (ref 39.0–52.0)
HEMATOCRIT: 27 % — AB (ref 39.0–52.0)
HEMOGLOBIN: 11.6 g/dL — AB (ref 13.0–17.0)
HEMOGLOBIN: 9.9 g/dL — AB (ref 13.0–17.0)
HEMOGLOBIN: 9.2 g/dL — AB (ref 13.0–17.0)
HEMOGLOBIN: 11.9 g/dL — AB (ref 13.0–17.0)
Hemoglobin: 11.6 g/dL — ABNORMAL LOW (ref 13.0–17.0)
POTASSIUM: 3.8 mmol/L (ref 3.5–5.1)
POTASSIUM: 4.2 mmol/L (ref 3.5–5.1)
POTASSIUM: 4.3 mmol/L (ref 3.5–5.1)
POTASSIUM: 4.3 mmol/L (ref 3.5–5.1)
POTASSIUM: 4.1 mmol/L (ref 3.5–5.1)
SODIUM: 139 mmol/L (ref 135–145)
SODIUM: 140 mmol/L (ref 135–145)
Sodium: 139 mmol/L (ref 135–145)
Sodium: 139 mmol/L (ref 135–145)
Sodium: 142 mmol/L (ref 135–145)
TCO2: 30 mmol/L (ref 0–100)
TCO2: 30 mmol/L (ref 0–100)
TCO2: 33 mmol/L (ref 0–100)
TCO2: 39 mmol/L (ref 0–100)
TCO2: 26 mmol/L (ref 0–100)

## 2016-04-23 LAB — GLUCOSE, CAPILLARY
GLUCOSE-CAPILLARY: 215 mg/dL — AB (ref 65–99)
GLUCOSE-CAPILLARY: 141 mg/dL — AB (ref 65–99)
GLUCOSE-CAPILLARY: 142 mg/dL — AB (ref 65–99)
GLUCOSE-CAPILLARY: 110 mg/dL — AB (ref 65–99)
GLUCOSE-CAPILLARY: 157 mg/dL — AB (ref 65–99)
GLUCOSE-CAPILLARY: 125 mg/dL — AB (ref 65–99)
Glucose-Capillary: 124 mg/dL — ABNORMAL HIGH (ref 65–99)
Glucose-Capillary: 120 mg/dL — ABNORMAL HIGH (ref 65–99)
Glucose-Capillary: 97 mg/dL (ref 65–99)
Glucose-Capillary: 148 mg/dL — ABNORMAL HIGH (ref 65–99)

## 2016-04-23 LAB — POCT I-STAT 3, ART BLOOD GAS (G3+)
Acid-Base Excess: 5 mmol/L — ABNORMAL HIGH (ref 0.0–2.0)
BICARBONATE: 25.7 mmol/L (ref 20.0–28.0)
Bicarbonate: 30.4 mmol/L — ABNORMAL HIGH (ref 20.0–28.0)
Bicarbonate: 25.1 mmol/L (ref 20.0–28.0)
Bicarbonate: 26.5 mmol/L (ref 20.0–28.0)
O2 SAT: 99 %
O2 SAT: 99 %
O2 Saturation: 100 %
O2 Saturation: 99 %
PCO2 ART: 40.7 mmHg (ref 32.0–48.0)
PH ART: 7.416 (ref 7.350–7.450)
PH ART: 7.369 (ref 7.350–7.450)
PO2 ART: 342 mmHg — AB (ref 83.0–108.0)
PO2 ART: 146 mmHg — AB (ref 83.0–108.0)
Patient temperature: 36.5
TCO2: 32 mmol/L (ref 0–100)
TCO2: 26 mmol/L (ref 0–100)
TCO2: 28 mmol/L (ref 0–100)
TCO2: 27 mmol/L (ref 0–100)
pCO2 arterial: 47.4 mmHg (ref 32.0–48.0)
pCO2 arterial: 50.1 mmHg — ABNORMAL HIGH (ref 32.0–48.0)
pCO2 arterial: 44.7 mmHg (ref 32.0–48.0)
pH, Arterial: 7.397 (ref 7.350–7.450)
pH, Arterial: 7.333 — ABNORMAL LOW (ref 7.350–7.450)
pO2, Arterial: 123 mmHg — ABNORMAL HIGH (ref 83.0–108.0)
pO2, Arterial: 136 mmHg — ABNORMAL HIGH (ref 83.0–108.0)

## 2016-04-23 LAB — POCT I-STAT 4, (NA,K, GLUC, HGB,HCT)
Glucose, Bld: 126 mg/dL — ABNORMAL HIGH (ref 65–99)
HEMATOCRIT: 33 % — AB (ref 39.0–52.0)
Hemoglobin: 11.2 g/dL — ABNORMAL LOW (ref 13.0–17.0)
Potassium: 3.7 mmol/L (ref 3.5–5.1)
SODIUM: 142 mmol/L (ref 135–145)

## 2016-04-23 LAB — HEMOGLOBIN AND HEMATOCRIT, BLOOD
HCT: 30.1 % — ABNORMAL LOW (ref 39.0–52.0)
Hemoglobin: 10.7 g/dL — ABNORMAL LOW (ref 13.0–17.0)

## 2016-04-23 LAB — CK: Total CK: 322 U/L (ref 49–397)

## 2016-04-23 LAB — APTT: APTT: 31 s (ref 24–36)

## 2016-04-23 LAB — PLATELET COUNT: PLATELETS: 184 10*3/uL (ref 150–400)

## 2016-04-23 LAB — MAGNESIUM: MAGNESIUM: 2.4 mg/dL (ref 1.7–2.4)

## 2016-04-23 SURGERY — CORONARY ARTERY BYPASS GRAFTING (CABG)
Anesthesia: General | Site: Leg Upper | Laterality: Right

## 2016-04-23 MED ORDER — MUPIROCIN 2 % EX OINT
TOPICAL_OINTMENT | CUTANEOUS | Status: AC
  Filled 2016-04-23: qty 22

## 2016-04-23 MED ORDER — BISACODYL 10 MG RE SUPP: 10.0000 mg | Freq: Every day | RECTAL | Status: DC

## 2016-04-23 MED ORDER — DEXTROSE 5 % IV SOLN
1.5000 g | Freq: Two times a day (BID) | INTRAVENOUS | Status: AC
  Administered 2016-04-23 – 2016-04-25 (×4): 1.5 g via INTRAVENOUS
  Filled 2016-04-23 (×4): qty 1.5

## 2016-04-23 MED ORDER — ORAL CARE MOUTH RINSE
15.0000 mL | Freq: Four times a day (QID) | OROMUCOSAL | Status: DC
  Administered 2016-04-23 – 2016-04-24 (×3): 15 mL via OROMUCOSAL

## 2016-04-23 MED ORDER — FENTANYL CITRATE (PF) 250 MCG/5ML IJ SOLN
INTRAMUSCULAR | Status: AC
  Filled 2016-04-23: qty 5

## 2016-04-23 MED ORDER — ROCURONIUM BROMIDE 10 MG/ML (PF) SYRINGE
PREFILLED_SYRINGE | INTRAVENOUS | Status: AC
  Filled 2016-04-23: qty 15

## 2016-04-23 MED ORDER — LACTATED RINGERS IV SOLN
INTRAVENOUS | Status: DC | PRN
  Administered 2016-04-23: 10:00:00 via INTRAVENOUS

## 2016-04-23 MED ORDER — CHLORHEXIDINE GLUCONATE CLOTH 2 % EX PADS
6.0000 | MEDICATED_PAD | Freq: Every day | CUTANEOUS | Status: DC
  Administered 2016-04-24 – 2016-04-27 (×4): 6 via TOPICAL

## 2016-04-23 MED ORDER — ACETAMINOPHEN 160 MG/5ML PO SOLN: 1000.0000 mg | Freq: Four times a day (QID) | ORAL | Status: DC

## 2016-04-23 MED ORDER — SODIUM CHLORIDE 0.9 % IV SOLN
30.0000 meq | Freq: Once | INTRAVENOUS | Status: AC
  Administered 2016-04-23: 30 meq via INTRAVENOUS
  Filled 2016-04-23: qty 15

## 2016-04-23 MED ORDER — THROMBIN 20000 UNITS EX SOLR: CUTANEOUS | Status: DC | PRN

## 2016-04-23 MED ORDER — METOPROLOL TARTRATE 5 MG/5ML IV SOLN: 2.5000 mg | INTRAVENOUS | Status: DC | PRN

## 2016-04-23 MED ORDER — SODIUM CHLORIDE 0.45 % IV SOLN: INTRAVENOUS | Status: DC | PRN

## 2016-04-23 MED ORDER — THROMBIN 20000 UNITS EX SOLR
CUTANEOUS | Status: AC
Start: 2016-04-23 — End: 2016-04-23
  Filled 2016-04-23: qty 20000

## 2016-04-23 MED ORDER — PROTAMINE SULFATE 10 MG/ML IV SOLN
INTRAVENOUS | Status: DC | PRN
  Administered 2016-04-23: 100 mg via INTRAVENOUS
  Administered 2016-04-23: 30 mg via INTRAVENOUS
  Administered 2016-04-23 (×2): 40 mg via INTRAVENOUS
  Administered 2016-04-23: 100 mg via INTRAVENOUS
  Administered 2016-04-23: 40 mg via INTRAVENOUS

## 2016-04-23 MED ORDER — LACTATED RINGERS IV SOLN
INTRAVENOUS | Status: DC | PRN
  Administered 2016-04-23: 07:00:00 via INTRAVENOUS

## 2016-04-23 MED ORDER — ACETAMINOPHEN 160 MG/5ML PO SOLN: 650.0000 mg | Freq: Once | ORAL | Status: AC

## 2016-04-23 MED ORDER — SODIUM CHLORIDE 0.9% FLUSH
3.0000 mL | Freq: Two times a day (BID) | INTRAVENOUS | Status: DC
Start: 2016-04-24 — End: 2016-04-25
  Administered 2016-04-24 – 2016-04-25 (×3): 3 mL via INTRAVENOUS

## 2016-04-23 MED ORDER — MUPIROCIN 2 % EX OINT
1.0000 "application " | TOPICAL_OINTMENT | Freq: Two times a day (BID) | CUTANEOUS | Status: DC
  Administered 2016-04-23 – 2016-04-26 (×8): 1 via NASAL
  Filled 2016-04-23 (×2): qty 22

## 2016-04-23 MED ORDER — METOPROLOL TARTRATE 25 MG/10 ML ORAL SUSPENSION: 12.5000 mg | Freq: Two times a day (BID) | ORAL | Status: DC

## 2016-04-23 MED ORDER — DOCUSATE SODIUM 100 MG PO CAPS
200.0000 mg | ORAL_CAPSULE | Freq: Every day | ORAL | Status: DC
Start: 2016-04-24 — End: 2016-04-25
  Administered 2016-04-24 – 2016-04-25 (×2): 200 mg via ORAL
  Filled 2016-04-23 (×2): qty 2

## 2016-04-23 MED ORDER — ONDANSETRON HCL 4 MG/2ML IJ SOLN
4.0000 mg | Freq: Four times a day (QID) | INTRAMUSCULAR | Status: DC | PRN
  Administered 2016-04-24: 4 mg via INTRAVENOUS
  Filled 2016-04-23: qty 2

## 2016-04-23 MED ORDER — SODIUM CHLORIDE 0.9 % IV SOLN: INTRAVENOUS | Status: DC

## 2016-04-23 MED ORDER — NITROGLYCERIN IN D5W 200-5 MCG/ML-% IV SOLN
INTRAVENOUS | Status: DC | PRN
  Administered 2016-04-23: 5 ug/min via INTRAVENOUS

## 2016-04-23 MED ORDER — LACTATED RINGERS IV SOLN: INTRAVENOUS | Status: DC

## 2016-04-23 MED ORDER — SODIUM CHLORIDE 0.9 % IJ SOLN
INTRAMUSCULAR | Status: AC
Start: 2016-04-23 — End: 2016-04-23
  Filled 2016-04-23: qty 10

## 2016-04-23 MED ORDER — SODIUM CHLORIDE 0.9 % IV SOLN
INTRAVENOUS | Status: DC
  Administered 2016-04-24: 3.4 [IU]/h via INTRAVENOUS
  Filled 2016-04-23: qty 2.5

## 2016-04-23 MED ORDER — CHLORHEXIDINE GLUCONATE 0.12% ORAL RINSE (MEDLINE KIT)
15.0000 mL | Freq: Two times a day (BID) | OROMUCOSAL | Status: DC
  Administered 2016-04-23 – 2016-04-24 (×2): 15 mL via OROMUCOSAL

## 2016-04-23 MED ORDER — PHENYLEPHRINE 40 MCG/ML (10ML) SYRINGE FOR IV PUSH (FOR BLOOD PRESSURE SUPPORT)
PREFILLED_SYRINGE | INTRAVENOUS | Status: AC
  Filled 2016-04-23: qty 10

## 2016-04-23 MED ORDER — MORPHINE SULFATE (PF) 2 MG/ML IV SOLN: 2.0000 mg | INTRAVENOUS | Status: DC | PRN

## 2016-04-23 MED ORDER — PANTOPRAZOLE SODIUM 40 MG PO TBEC: 40.0000 mg | DELAYED_RELEASE_TABLET | Freq: Every day | ORAL | Status: DC

## 2016-04-23 MED ORDER — GABAPENTIN 300 MG PO CAPS
300.0000 mg | ORAL_CAPSULE | Freq: Three times a day (TID) | ORAL | Status: DC
  Administered 2016-04-23 – 2016-04-27 (×11): 300 mg via ORAL
  Filled 2016-04-23 (×11): qty 1

## 2016-04-23 MED ORDER — PHENYLEPHRINE HCL 10 MG/ML IJ SOLN
INTRAMUSCULAR | Status: DC | PRN
  Administered 2016-04-23: 80 ug via INTRAVENOUS

## 2016-04-23 MED ORDER — ACETAMINOPHEN 500 MG PO TABS
1000.0000 mg | ORAL_TABLET | Freq: Four times a day (QID) | ORAL | Status: DC
  Administered 2016-04-24 – 2016-04-25 (×7): 1000 mg via ORAL
  Filled 2016-04-23 (×7): qty 2

## 2016-04-23 MED ORDER — PHENYLEPHRINE HCL 10 MG/ML IJ SOLN
INTRAVENOUS | Status: DC | PRN
  Administered 2016-04-23: 30 ug/min via INTRAVENOUS
  Administered 2016-04-23: 40 ug/min via INTRAVENOUS

## 2016-04-23 MED ORDER — CHLORHEXIDINE GLUCONATE 4 % EX LIQD: 30.0000 mL | CUTANEOUS | Status: DC

## 2016-04-23 MED ORDER — CHLORHEXIDINE GLUCONATE 0.12 % MT SOLN
15.0000 mL | OROMUCOSAL | Status: AC
  Administered 2016-04-23: 15 mL via OROMUCOSAL

## 2016-04-23 MED ORDER — NITROGLYCERIN IN D5W 200-5 MCG/ML-% IV SOLN: 0.0000 ug/min | INTRAVENOUS | Status: DC

## 2016-04-23 MED ORDER — MIDAZOLAM HCL 2 MG/2ML IJ SOLN: 2.0000 mg | INTRAMUSCULAR | Status: DC | PRN

## 2016-04-23 MED ORDER — OXYCODONE HCL 5 MG PO TABS
5.0000 mg | ORAL_TABLET | ORAL | Status: DC | PRN
  Administered 2016-04-23 – 2016-04-24 (×3): 10 mg via ORAL
  Administered 2016-04-24 (×2): 5 mg via ORAL
  Administered 2016-04-25 (×2): 10 mg via ORAL
  Filled 2016-04-23: qty 2
  Filled 2016-04-23: qty 1
  Filled 2016-04-23 (×4): qty 2
  Filled 2016-04-23: qty 1
  Filled 2016-04-23: qty 2

## 2016-04-23 MED ORDER — FENTANYL CITRATE (PF) 250 MCG/5ML IJ SOLN
INTRAMUSCULAR | Status: AC
  Filled 2016-04-23: qty 20

## 2016-04-23 MED ORDER — MORPHINE SULFATE (PF) 4 MG/ML IV SOLN
2.0000 mg | INTRAVENOUS | Status: DC | PRN
  Administered 2016-04-24 (×2): 2 mg via INTRAVENOUS
  Filled 2016-04-23 (×2): qty 1

## 2016-04-23 MED ORDER — ALBUMIN HUMAN 5 % IV SOLN
INTRAVENOUS | Status: DC | PRN
  Administered 2016-04-23 (×2): via INTRAVENOUS

## 2016-04-23 MED ORDER — HEMOSTATIC AGENTS (NO CHARGE) OPTIME
TOPICAL | Status: DC | PRN
  Administered 2016-04-23: 1 via TOPICAL

## 2016-04-23 MED ORDER — SODIUM CHLORIDE 0.9% FLUSH: 3.0000 mL | INTRAVENOUS | Status: DC | PRN

## 2016-04-23 MED ORDER — ESCITALOPRAM OXALATE 10 MG PO TABS
10.0000 mg | ORAL_TABLET | Freq: Every day | ORAL | Status: DC
  Administered 2016-04-23 – 2016-04-26 (×4): 10 mg via ORAL
  Filled 2016-04-23 (×4): qty 1

## 2016-04-23 MED ORDER — INSULIN REGULAR BOLUS VIA INFUSION
0.0000 [IU] | Freq: Three times a day (TID) | INTRAVENOUS | Status: DC
  Filled 2016-04-23: qty 10

## 2016-04-23 MED ORDER — HEPARIN SODIUM (PORCINE) 1000 UNIT/ML IJ SOLN
INTRAMUSCULAR | Status: DC | PRN
  Administered 2016-04-23: 45 mL via INTRAVENOUS

## 2016-04-23 MED ORDER — ROCURONIUM BROMIDE 100 MG/10ML IV SOLN
INTRAVENOUS | Status: DC | PRN
  Administered 2016-04-23 (×3): 100 mg via INTRAVENOUS

## 2016-04-23 MED ORDER — ATORVASTATIN CALCIUM 40 MG PO TABS
40.0000 mg | ORAL_TABLET | Freq: Every day | ORAL | Status: DC
  Administered 2016-04-23 – 2016-04-27 (×5): 40 mg via ORAL
  Filled 2016-04-23 (×5): qty 1

## 2016-04-23 MED ORDER — LIDOCAINE HCL (CARDIAC) 20 MG/ML IV SOLN
INTRAVENOUS | Status: DC | PRN
  Administered 2016-04-23: 60 mg via INTRAVENOUS

## 2016-04-23 MED ORDER — TRAMADOL HCL 50 MG PO TABS: 50.0000 mg | ORAL_TABLET | ORAL | Status: DC | PRN

## 2016-04-23 MED ORDER — HEPARIN SODIUM (PORCINE) 1000 UNIT/ML IJ SOLN
INTRAMUSCULAR | Status: AC
Start: 2016-04-23 — End: 2016-04-23
  Filled 2016-04-23: qty 2

## 2016-04-23 MED ORDER — MORPHINE SULFATE (PF) 2 MG/ML IV SOLN: 1.0000 mg | INTRAVENOUS | Status: DC | PRN

## 2016-04-23 MED ORDER — MIDAZOLAM HCL 10 MG/2ML IJ SOLN
INTRAMUSCULAR | Status: AC
  Filled 2016-04-23: qty 2

## 2016-04-23 MED ORDER — MIDAZOLAM HCL 5 MG/5ML IJ SOLN
INTRAMUSCULAR | Status: DC | PRN
  Administered 2016-04-23: 5 mg via INTRAVENOUS
  Administered 2016-04-23 (×2): 2 mg via INTRAVENOUS
  Administered 2016-04-23: 1 mg via INTRAVENOUS

## 2016-04-23 MED ORDER — ACETAMINOPHEN 650 MG RE SUPP
650.0000 mg | Freq: Once | RECTAL | Status: AC
  Administered 2016-04-23: 650 mg via RECTAL

## 2016-04-23 MED ORDER — SODIUM CHLORIDE 0.9 % IV SOLN
0.0000 ug/min | INTRAVENOUS | Status: DC
  Administered 2016-04-24: 20 ug/min via INTRAVENOUS
  Filled 2016-04-23 (×2): qty 2

## 2016-04-23 MED ORDER — LACTATED RINGERS IV SOLN: 500.0000 mL | Freq: Once | INTRAVENOUS | Status: DC | PRN

## 2016-04-23 MED ORDER — CHLORHEXIDINE GLUCONATE 0.12 % MT SOLN
15.0000 mL | Freq: Once | OROMUCOSAL | Status: AC
  Administered 2016-04-23: 15 mL via OROMUCOSAL
  Filled 2016-04-23: qty 15

## 2016-04-23 MED ORDER — MORPHINE SULFATE (PF) 4 MG/ML IV SOLN
1.0000 mg | INTRAVENOUS | Status: AC | PRN
  Administered 2016-04-23 (×2): 2 mg via INTRAVENOUS
  Filled 2016-04-23 (×2): qty 1

## 2016-04-23 MED ORDER — ALBUMIN HUMAN 5 % IV SOLN: 250.0000 mL | INTRAVENOUS | Status: AC | PRN

## 2016-04-23 MED ORDER — PANTOPRAZOLE SODIUM 40 MG PO TBEC
40.0000 mg | DELAYED_RELEASE_TABLET | Freq: Every day | ORAL | Status: DC
  Administered 2016-04-24 – 2016-04-25 (×2): 40 mg via ORAL
  Filled 2016-04-23 (×2): qty 1

## 2016-04-23 MED ORDER — MAGNESIUM SULFATE 4 GM/100ML IV SOLN
INTRAVENOUS | Status: AC
  Filled 2016-04-23: qty 100

## 2016-04-23 MED ORDER — LACTATED RINGERS IV SOLN
INTRAVENOUS | Status: DC | PRN
  Administered 2016-04-23 (×2): via INTRAVENOUS

## 2016-04-23 MED ORDER — GLYCOPYRROLATE 0.2 MG/ML IJ SOLN
INTRAMUSCULAR | Status: DC | PRN
  Administered 2016-04-23: 0.2 mg via INTRAVENOUS

## 2016-04-23 MED ORDER — 0.9 % SODIUM CHLORIDE (POUR BTL) OPTIME
TOPICAL | Status: DC | PRN
  Administered 2016-04-23: 5000 mL

## 2016-04-23 MED ORDER — SODIUM CHLORIDE 0.9 % IV SOLN
INTRAVENOUS | Status: DC | PRN
  Administered 2016-04-23: 0.2 ug/kg/h via INTRAVENOUS

## 2016-04-23 MED ORDER — ASPIRIN 81 MG PO CHEW: 324.0000 mg | CHEWABLE_TABLET | Freq: Every day | ORAL | Status: DC

## 2016-04-23 MED ORDER — PROPOFOL 10 MG/ML IV BOLUS
INTRAVENOUS | Status: DC | PRN
  Administered 2016-04-23: 50 mg via INTRAVENOUS

## 2016-04-23 MED ORDER — SODIUM CHLORIDE 0.9 % IV SOLN
INTRAVENOUS | Status: DC | PRN
  Administered 2016-04-23: 1 [IU]/h via INTRAVENOUS

## 2016-04-23 MED ORDER — BISACODYL 5 MG PO TBEC
10.0000 mg | DELAYED_RELEASE_TABLET | Freq: Every day | ORAL | Status: DC
Start: 2016-04-24 — End: 2016-04-25
  Administered 2016-04-24 – 2016-04-25 (×2): 10 mg via ORAL
  Filled 2016-04-23 (×2): qty 2

## 2016-04-23 MED ORDER — VANCOMYCIN HCL IN DEXTROSE 1-5 GM/200ML-% IV SOLN
1000.0000 mg | Freq: Once | INTRAVENOUS | Status: AC
  Administered 2016-04-23: 1000 mg via INTRAVENOUS
  Filled 2016-04-23: qty 200

## 2016-04-23 MED ORDER — LIDOCAINE 2% (20 MG/ML) 5 ML SYRINGE
INTRAMUSCULAR | Status: AC
  Filled 2016-04-23: qty 5

## 2016-04-23 MED ORDER — MAGNESIUM SULFATE 4 GM/100ML IV SOLN
4.0000 g | Freq: Once | INTRAVENOUS | Status: AC
  Administered 2016-04-23: 4 g via INTRAVENOUS

## 2016-04-23 MED ORDER — FENTANYL CITRATE (PF) 250 MCG/5ML IJ SOLN
INTRAMUSCULAR | Status: DC | PRN
  Administered 2016-04-23: 150 ug via INTRAVENOUS
  Administered 2016-04-23 (×3): 250 ug via INTRAVENOUS
  Administered 2016-04-23 (×2): 50 ug via INTRAVENOUS
  Administered 2016-04-23: 250 ug via INTRAVENOUS

## 2016-04-23 MED ORDER — EPHEDRINE SULFATE 50 MG/ML IJ SOLN
INTRAMUSCULAR | Status: DC | PRN
  Administered 2016-04-23: 5 mg via INTRAVENOUS

## 2016-04-23 MED ORDER — THROMBIN 20000 UNITS EX SOLR
OROMUCOSAL | Status: DC | PRN
  Administered 2016-04-23: 12 mL via TOPICAL

## 2016-04-23 MED ORDER — SODIUM CHLORIDE 0.9 % IV SOLN: 250.0000 mL | INTRAVENOUS | Status: DC

## 2016-04-23 MED ORDER — FAMOTIDINE IN NACL 20-0.9 MG/50ML-% IV SOLN
20.0000 mg | Freq: Two times a day (BID) | INTRAVENOUS | Status: AC
  Administered 2016-04-23 (×2): 20 mg via INTRAVENOUS
  Filled 2016-04-23: qty 50

## 2016-04-23 MED ORDER — METOPROLOL TARTRATE 12.5 MG HALF TABLET: 12.5000 mg | ORAL_TABLET | Freq: Two times a day (BID) | ORAL | Status: DC

## 2016-04-23 MED ORDER — ASPIRIN EC 325 MG PO TBEC
325.0000 mg | DELAYED_RELEASE_TABLET | Freq: Every day | ORAL | Status: DC
  Administered 2016-04-24 – 2016-04-25 (×2): 325 mg via ORAL
  Filled 2016-04-23 (×2): qty 1

## 2016-04-23 MED ORDER — DEXMEDETOMIDINE HCL 200 MCG/2ML IV SOLN
0.0000 ug/kg/h | INTRAVENOUS | Status: DC
  Filled 2016-04-23: qty 2

## 2016-04-23 MED ORDER — PROPOFOL 10 MG/ML IV BOLUS
INTRAVENOUS | Status: AC
  Filled 2016-04-23: qty 40

## 2016-04-23 MED FILL — Heparin Sodium (Porcine) Inj 1000 Unit/ML: INTRAMUSCULAR | Qty: 30 | Status: AC

## 2016-04-23 MED FILL — Potassium Chloride Inj 2 mEq/ML: INTRAVENOUS | Qty: 40 | Status: AC

## 2016-04-23 MED FILL — Magnesium Sulfate Inj 50%: INTRAMUSCULAR | Qty: 10 | Status: AC

## 2016-04-23 SURGICAL SUPPLY — 103 items
BAG DECANTER FOR FLEXI CONT (MISCELLANEOUS) ×4 IMPLANT
BANDAGE ACE 4X5 VEL STRL LF (GAUZE/BANDAGES/DRESSINGS) ×4 IMPLANT
BANDAGE ACE 6X5 VEL STRL LF (GAUZE/BANDAGES/DRESSINGS) ×4 IMPLANT
BASKET HEART (ORDER IN 25'S) (MISCELLANEOUS) ×1
BASKET HEART (ORDER IN 25S) (MISCELLANEOUS) ×3 IMPLANT
BLADE STERNUM SYSTEM 6 (BLADE) ×8 IMPLANT
BLADE SURG 11 STRL SS (BLADE) ×4 IMPLANT
BNDG GAUZE ELAST 4 BULKY (GAUZE/BANDAGES/DRESSINGS) ×4 IMPLANT
CANISTER SUCT 3000ML PPV (MISCELLANEOUS) ×4 IMPLANT
CATH ROBINSON RED A/P 18FR (CATHETERS) ×8 IMPLANT
CATH THORACIC 28FR (CATHETERS) ×4 IMPLANT
CATH THORACIC 36FR (CATHETERS) ×4 IMPLANT
CATH THORACIC 36FR RT ANG (CATHETERS) ×4 IMPLANT
CLIP TI MEDIUM 24 (CLIP) IMPLANT
CLIP TI WIDE RED SMALL 24 (CLIP) ×8 IMPLANT
CRADLE DONUT ADULT HEAD (MISCELLANEOUS) ×4 IMPLANT
DERMABOND ADVANCED (GAUZE/BANDAGES/DRESSINGS) ×2
DERMABOND ADVANCED .7 DNX12 (GAUZE/BANDAGES/DRESSINGS) ×6 IMPLANT
DRAPE CARDIOVASCULAR INCISE (DRAPES) ×1
DRAPE SLUSH/WARMER DISC (DRAPES) ×4 IMPLANT
DRAPE SRG 135X102X78XABS (DRAPES) ×3 IMPLANT
DRSG COVADERM 4X14 (GAUZE/BANDAGES/DRESSINGS) ×4 IMPLANT
ELECT CAUTERY BLADE 6.4 (BLADE) ×4 IMPLANT
ELECT REM PT RETURN 9FT ADLT (ELECTROSURGICAL) ×8
ELECTRODE REM PT RTRN 9FT ADLT (ELECTROSURGICAL) ×6 IMPLANT
FELT TEFLON 1X6 (MISCELLANEOUS) ×8 IMPLANT
GAUZE SPONGE 4X4 12PLY STRL (GAUZE/BANDAGES/DRESSINGS) ×8 IMPLANT
GAUZE SPONGE 4X4 12PLY STRL LF (GAUZE/BANDAGES/DRESSINGS) ×8 IMPLANT
GLOVE BIO SURGEON STRL SZ 6 (GLOVE) IMPLANT
GLOVE BIO SURGEON STRL SZ 6.5 (GLOVE) ×20 IMPLANT
GLOVE BIO SURGEON STRL SZ7 (GLOVE) IMPLANT
GLOVE BIO SURGEON STRL SZ7.5 (GLOVE) IMPLANT
GLOVE BIOGEL PI IND STRL 6 (GLOVE) IMPLANT
GLOVE BIOGEL PI IND STRL 6.5 (GLOVE) ×6 IMPLANT
GLOVE BIOGEL PI IND STRL 7.0 (GLOVE) IMPLANT
GLOVE BIOGEL PI INDICATOR 6 (GLOVE)
GLOVE BIOGEL PI INDICATOR 6.5 (GLOVE) ×2
GLOVE BIOGEL PI INDICATOR 7.0 (GLOVE)
GLOVE EUDERMIC 7 POWDERFREE (GLOVE) ×8 IMPLANT
GLOVE ORTHO TXT STRL SZ7.5 (GLOVE) IMPLANT
GOWN STRL REUS W/ TWL LRG LVL3 (GOWN DISPOSABLE) ×15 IMPLANT
GOWN STRL REUS W/ TWL XL LVL3 (GOWN DISPOSABLE) ×3 IMPLANT
GOWN STRL REUS W/TWL LRG LVL3 (GOWN DISPOSABLE) ×5
GOWN STRL REUS W/TWL XL LVL3 (GOWN DISPOSABLE) ×1
HEMOSTAT POWDER SURGIFOAM 1G (HEMOSTASIS) ×12 IMPLANT
HEMOSTAT SURGICEL 2X14 (HEMOSTASIS) ×4 IMPLANT
INSERT FOGARTY 61MM (MISCELLANEOUS) IMPLANT
INSERT FOGARTY XLG (MISCELLANEOUS) IMPLANT
KIT BASIN OR (CUSTOM PROCEDURE TRAY) ×4 IMPLANT
KIT CATH CPB BARTLE (MISCELLANEOUS) ×4 IMPLANT
KIT ROOM TURNOVER OR (KITS) ×4 IMPLANT
KIT SUCTION CATH 14FR (SUCTIONS) ×4 IMPLANT
KIT VASOVIEW HEMOPRO VH 3000 (KITS) ×4 IMPLANT
NS IRRIG 1000ML POUR BTL (IV SOLUTION) ×20 IMPLANT
PACK OPEN HEART (CUSTOM PROCEDURE TRAY) ×4 IMPLANT
PAD ARMBOARD 7.5X6 YLW CONV (MISCELLANEOUS) ×8 IMPLANT
PAD ELECT DEFIB RADIOL ZOLL (MISCELLANEOUS) ×4 IMPLANT
PENCIL BUTTON HOLSTER BLD 10FT (ELECTRODE) ×4 IMPLANT
PUNCH AORTIC ROTATE 4.0MM (MISCELLANEOUS) IMPLANT
PUNCH AORTIC ROTATE 4.5MM 8IN (MISCELLANEOUS) ×4 IMPLANT
PUNCH AORTIC ROTATE 5MM 8IN (MISCELLANEOUS) IMPLANT
SET CARDIOPLEGIA MPS 5001102 (MISCELLANEOUS) ×4 IMPLANT
SOLUTION ANTI FOG 6CC (MISCELLANEOUS) ×4 IMPLANT
SPONGE INTESTINAL PEANUT (DISPOSABLE) IMPLANT
SPONGE LAP 18X18 X RAY DECT (DISPOSABLE) ×4 IMPLANT
SPONGE LAP 4X18 X RAY DECT (DISPOSABLE) ×4 IMPLANT
SUT BONE WAX W31G (SUTURE) ×4 IMPLANT
SUT MNCRL AB 4-0 PS2 18 (SUTURE) ×4 IMPLANT
SUT PROLENE 3 0 SH DA (SUTURE) IMPLANT
SUT PROLENE 3 0 SH1 36 (SUTURE) ×4 IMPLANT
SUT PROLENE 4 0 RB 1 (SUTURE)
SUT PROLENE 4 0 SH DA (SUTURE) IMPLANT
SUT PROLENE 4-0 RB1 .5 CRCL 36 (SUTURE) IMPLANT
SUT PROLENE 5 0 C 1 36 (SUTURE) IMPLANT
SUT PROLENE 6 0 C 1 30 (SUTURE) IMPLANT
SUT PROLENE 7 0 BV 1 (SUTURE) IMPLANT
SUT PROLENE 7 0 BV1 MDA (SUTURE) ×8 IMPLANT
SUT PROLENE 8 0 BV175 6 (SUTURE) IMPLANT
SUT SILK  1 MH (SUTURE)
SUT SILK 1 MH (SUTURE) IMPLANT
SUT STEEL STERNAL CCS#1 18IN (SUTURE) IMPLANT
SUT STEEL SZ 6 DBL 3X14 BALL (SUTURE) ×12 IMPLANT
SUT VIC AB 1 CTX 36 (SUTURE) ×2
SUT VIC AB 1 CTX36XBRD ANBCTR (SUTURE) ×6 IMPLANT
SUT VIC AB 2-0 CT1 27 (SUTURE) ×1
SUT VIC AB 2-0 CT1 TAPERPNT 27 (SUTURE) ×3 IMPLANT
SUT VIC AB 2-0 CTX 27 (SUTURE) IMPLANT
SUT VIC AB 3-0 SH 27 (SUTURE)
SUT VIC AB 3-0 SH 27X BRD (SUTURE) IMPLANT
SUT VIC AB 3-0 X1 27 (SUTURE) IMPLANT
SUT VICRYL 4-0 PS2 18IN ABS (SUTURE) IMPLANT
SUTURE E-PAK OPEN HEART (SUTURE) ×4 IMPLANT
SYSTEM SAHARA CHEST DRAIN ATS (WOUND CARE) ×4 IMPLANT
TAPE CLOTH SURG 4X10 WHT LF (GAUZE/BANDAGES/DRESSINGS) ×8 IMPLANT
TAPE PAPER 3X10 WHT MICROPORE (GAUZE/BANDAGES/DRESSINGS) ×4 IMPLANT
TOWEL GREEN STERILE (TOWEL DISPOSABLE) ×4 IMPLANT
TOWEL GREEN STERILE FF (TOWEL DISPOSABLE) IMPLANT
TOWEL OR 17X24 6PK STRL BLUE (TOWEL DISPOSABLE) ×4 IMPLANT
TOWEL OR 17X26 10 PK STRL BLUE (TOWEL DISPOSABLE) ×4 IMPLANT
TRAY FOLEY SILVER 16FR TEMP (SET/KITS/TRAYS/PACK) ×4 IMPLANT
TUBING INSUFFLATION (TUBING) ×4 IMPLANT
UNDERPAD 30X30 (UNDERPADS AND DIAPERS) ×4 IMPLANT
WATER STERILE IRR 1000ML POUR (IV SOLUTION) ×8 IMPLANT

## 2016-04-23 NOTE — Anesthesia Procedure Notes (Signed)
Central Venous Catheter Insertion Performed by: Suzette Battiest, anesthesiologist Start/End4/23/2018 6:50 AM, 04/23/2016 7:00 AM Patient location: Pre-op. Preanesthetic checklist: patient identified, IV checked, site marked, risks and benefits discussed, surgical consent, monitors and equipment checked, pre-op evaluation, timeout performed and anesthesia consent Lidocaine 1% used for infiltration and patient sedated Hand hygiene performed  and maximum sterile barriers used  Catheter size: 8.5 Fr PA cath was placed.Sheath introducer Procedure performed using ultrasound guided technique. Ultrasound Notes:anatomy identified, needle tip was noted to be adjacent to the nerve/plexus identified, no ultrasound evidence of intravascular and/or intraneural injection and image(s) printed for medical record Attempts: 1 Following insertion, line sutured and dressing applied. Post procedure assessment: blood return through all ports, free fluid flow and no air  Patient tolerated the procedure well with no immediate complications.

## 2016-04-23 NOTE — Progress Notes (Signed)
Patient ID: Juan Ray, male   DOB: 1960/05/15, 56 y.o.   MRN: [id number]   SICU Evening Rounds:   Hemodynamically stable  CI = 2.5  Extubated and alert  Urine output good  CT output low  CBC    Component Value Date/Time   WBC 19.1 (H) 04/23/2016 1338   RBC 3.81 (L) 04/23/2016 1338   HGB 12.2 (L) 04/23/2016 1338   HCT 33.9 (L) 04/23/2016 1338   HCT 43.6 04/09/2016 1247   PLT 196 04/23/2016 1338   PLT 245 04/09/2016 1247   MCV 89.0 04/23/2016 1338   MCV 92 04/09/2016 1247   MCH 32.0 04/23/2016 1338   MCHC 36.0 04/23/2016 1338   RDW 12.9 04/23/2016 1338   RDW 13.8 04/09/2016 1247   LYMPHSABS 1,260 01/27/2016 1211   MONOABS 1,400 (H) 01/27/2016 1211   EOSABS 70 01/27/2016 1211   BASOSABS 0 01/27/2016 1211     BMET    Component Value Date/Time   NA 142 04/23/2016 1327   NA 142 04/09/2016 1247   K 3.7 04/23/2016 1327   CL 100 (L) 04/23/2016 1114   CO2 21 (L) 04/19/2016 1109   GLUCOSE 126 (H) 04/23/2016 1327   BUN 13 04/23/2016 1114   BUN 17 04/09/2016 1247   CREATININE 0.80 04/23/2016 1114   CREATININE 0.82 01/27/2016 1211   CALCIUM 9.1 04/19/2016 1109   GFRNONAA >60 04/19/2016 1109   GFRNONAA >89 01/27/2016 1211   GFRAA >60 04/19/2016 1109   GFRAA >89 01/27/2016 1211     A/P:  Stable postop course. Continue current plans

## 2016-04-23 NOTE — Anesthesia Procedure Notes (Signed)
Procedure Name: Intubation Date/Time: 04/23/2016 8:00 AM Performed by: Ollen Bowl Pre-anesthesia Checklist: Patient identified, Emergency Drugs available, Suction available, Patient being monitored and Timeout performed Patient Re-evaluated:Patient Re-evaluated prior to inductionOxygen Delivery Method: Circle system utilized and Simple face mask Preoxygenation: Pre-oxygenation with 100% oxygen Intubation Type: IV induction Ventilation: Mask ventilation without difficulty and Oral airway inserted - appropriate to patient size Laryngoscope Size: Miller and 3 Grade View: Grade II Tube type: Subglottic suction tube Tube size: 8.0 mm Number of attempts: 1 Airway Equipment and Method: Patient positioned with wedge pillow and Stylet Placement Confirmation: ETT inserted through vocal cords under direct vision,  positive ETCO2 and breath sounds checked- equal and bilateral Secured at: 22 cm Tube secured with: Tape Dental Injury: Teeth and Oropharynx as per pre-operative assessment

## 2016-04-23 NOTE — H&P (Signed)
[address]       [place] [address]             [phone]      Cardiothoracic Surgery History and Physical   PCP is Odette Fraction, MD Referring Provider is End, Harrell Gave, MD      Chief Complaint  Patient presents with  . Coronary Artery Disease        HPI:  The patient is a 56 year old gentleman with poorly controlled DM with neuropathy, HTN, hyperlipidemia, family history of premature CAD who suffered an inferior-posterior STEMI in 04/2012 treated with a DES to the mid LCX. He had some residual disease treated medically. He has not returned for medical follow up until recently and was off of his medications for a while due to losing his insurance and not being able to afford them. 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 He ruled out for MI and had an outpt stress test that was a high risk study with an EF of < 30%. He underwent cath on 04/13/2016 showing severe 3-vessel CAD with an LVEF of 35-40%. He continues to have intermittent deep sternal pain that feels like a knife going through his chest. It occurs with exertion and at rest. He has some chronic sensitivity of the skin on his anterior chest that sounds like neuropathy but the deep pain is different and recent. He denies shortness of breath or fatigue although his wife says that he is very tired at the end of the day and sleeps a lot.        Past Medical History:  Diagnosis Date  . CAD (coronary artery disease) 04/07/2012   Inferoposterior STEMI s/p DES-mid LCx  . Diabetes mellitus   . Diabetic neuropathy ([place])   . High cholesterol   . Hypertension   . Myocardial infarction ([place])   . Obesity   . Pancreatitis    a. mild by CT 06/2011  . Peptic ulcer disease    a. 06/2009 EGD: multiple gastric and duodenal ulcers.         Past Surgical History:  Procedure Laterality Date  . CORONARY ANGIOPLASTY  WITH STENT PLACEMENT  04/07/2012   70% prox LAD, 70-80% ostial diagonal, 70% mid-distal LAD, 80% prox OM1, mid LCx totally occluded just distal to OM1 s/p DES, occluded RCA; severe inferior HK, LVEF 45%  . ESOPHAGOGASTRODUODENOSCOPY  06/18/2011   NL ESOPHAGUS/UlcerATED LESIONS/ SUPERFICIAL Ulcers  . INTRAVASCULAR PRESSURE WIRE/FFR STUDY N/A 04/13/2016   Procedure: Intravascular Pressure Wire/FFR Study;  Surgeon: Nelva Bush, MD;  Location: [facility];  Service: Cardiovascular;  Laterality: N/A;  . LEFT HEART CATH AND CORONARY ANGIOGRAPHY N/A 04/13/2016   Procedure: Left Heart Cath and Coronary Angiography;  Surgeon: Nelva Bush, MD;  Location: [facility];  Service: Cardiovascular;  Laterality: N/A;  . LEFT HEART CATHETERIZATION WITH CORONARY ANGIOGRAM N/A 04/07/2012   Procedure: LEFT HEART CATHETERIZATION WITH CORONARY ANGIOGRAM;  Surgeon: Sherren Mocha, MD;  Location: [facility] CATH [facility];  Service: Cardiovascular;  Laterality: N/A;  . PERCUTANEOUS CORONARY STENT INTERVENTION (PCI-S)  04/07/2012   Procedure: PERCUTANEOUS CORONARY STENT INTERVENTION (PCI-S);  Surgeon: Sherren Mocha, MD;  Location: [facility] CATH LAB;  Service: Cardiovascular;;          Family History  Problem Relation Age of Onset  . Heart attack Father     MI x 2 in late 50's, currently 76's  .  CVA Sister     late 4s  . CAD Maternal Uncle     MI at 50    Social History      Social History  Substance Use Topics  . Smoking status: Former Smoker    Years: 20.00    Quit date: 2000  . Smokeless tobacco: Former Systems developer    Quit date: 01/01/1998  . Alcohol use No  Married and lives with his wife and two children. 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        Current Outpatient Prescriptions  Medication Sig Dispense Refill  . aspirin EC 81 MG EC tablet Take 1 tablet (81 mg total) by mouth daily.    Marland Kitchen atorvastatin (LIPITOR) 40 MG tablet Take 1 tablet (40 mg total) by  mouth daily. 30 tablet 5  . docusate sodium (DULCOLAX PINK STOOL SOFTENER) 100 MG capsule Take 200 mg by mouth at bedtime.     Marland Kitchen escitalopram (LEXAPRO) 10 MG tablet Take 10 mg by mouth at bedtime.   0  . gabapentin (NEURONTIN) 300 MG capsule Take 300 mg by mouth 3 (three) times daily.   1  . glipiZIDE (GLUCOTROL) 10 MG tablet TAKE ONE TABLET BY MOUTH TWICE DAILY BEFORE A MEAL 60 tablet 3  . isosorbide mononitrate (IMDUR) 30 MG 24 hr tablet Take 1 tablet (30 mg total) by mouth daily. (Patient taking differently: Take 30 mg by mouth daily. Taking 1/2 tablet (15 mg) daily) 30 tablet 5  . lisinopril (PRINIVIL,ZESTRIL) 10 MG tablet Take 1 tablet by mouth daily.  0  . metFORMIN (GLUCOPHAGE) 1000 MG tablet Take 1 tablet (1,000 mg total) by mouth 2 (two) times daily. 60 tablet 3  . metoprolol tartrate (LOPRESSOR) 25 MG tablet Take 1 tablet (25 mg total) by mouth 2 (two) times daily. 60 tablet 1  . nitroGLYCERIN (NITROSTAT) 0.4 MG SL tablet Place 1 tablet (0.4 mg total) under the tongue every 5 (five) minutes as needed for chest pain. X 3 doses 25 tablet prn  . oxyCODONE-acetaminophen (PERCOCET) 7.5-325 MG tablet Take 1 tablet by mouth every 4 (four) hours as needed for severe pain. 30 tablet 0  . pantoprazole (PROTONIX) 40 MG tablet Take 1 tablet (40 mg total) by mouth daily. 30 tablet 11   No current facility-administered medications for this visit.     No Known Allergies  Review of Systems  Constitutional: Positive for fatigue.  HENT: Negative.        Dentures  Eyes: Negative.   Respiratory: Positive for chest tightness. Negative for cough and shortness of breath.   Cardiovascular: Positive for chest pain and leg swelling. Negative for palpitations.  Gastrointestinal: Positive for abdominal pain and constipation.  Endocrine: Negative.   Genitourinary:       Kidney stones  Musculoskeletal: Positive for arthralgias.  Skin: Negative.   Neurological:       Neuropathy in feet    Hematological: Negative.   Psychiatric/Behavioral:       Recent stress with his business    BP 105/69   Pulse 85   Resp 20   Ht 5\' 9"  (1.753 m)   Wt 240 lb (108.9 kg)   SpO2 98% Comment: RA  BMI 35.44 kg/m  Physical Exam  Constitutional: He is oriented to person, place, and time. No distress.  Obese  HENT:  Head: Normocephalic and atraumatic.  Mouth/Throat: Oropharynx is clear and moist.  Eyes: EOM are normal. Pupils are equal, round, and  reactive to light.  Neck: Normal range of motion. Neck supple. No JVD present. No thyromegaly present.  Cardiovascular: Normal rate, regular rhythm, normal heart sounds and intact distal pulses.   No murmur heard. Pulmonary/Chest: Effort normal and breath sounds normal. No respiratory distress.  Abdominal: Soft. Bowel sounds are normal. There is no tenderness.  obese  Musculoskeletal: Normal range of motion. He exhibits no edema.  Lymphadenopathy:    He has no cervical adenopathy.  Neurological: He is alert and oriented to person, place, and time. He has normal strength. No cranial nerve deficit or sensory deficit.  Skin: Skin is warm and dry.  Psychiatric: He has a normal mood and affect.     Diagnostic Tests:  Johncharles Fusselman  Cardiac catheterization  Order# [id number]  Reading physician: Nelva Bush, MD Ordering physician: Nelva Bush, MD Study date: 04/13/16  Physicians   Panel Physicians Referring Physician Case Authorizing Physician  Nelva Bush, MD (Primary)    Procedures   Intravascular Pressure Wire/FFR Study  Left Heart Cath and Coronary Angiography  Conclusion   Conclusions: 1. Significant 3-vessel coronary artery disease, including sequential 50-70% ostial, proximal, and mid LAD stenoses, which are hemodynamically significant (resting Pd/Pa 0.72), 50% in-stent restenosis in mid LCx as well as 80% 90% stenoses involving OM2 and distal LCx, and chronic total occlusion of mid RCA with distal vessel  filling via bridging and left-to-right collaterals. 2. Upper normal left ventricular filling pressure. 3. Moderately reduced left ventricular contraction with inferior akinesis (LVEF 35-40%).  Recommendations: 1. Cardiac surgery consultation for CABG, given significant 3-vessel coronary artery disease, reduced LV function, and diabetes mellitus. 2. Aggressive secondary prevention, including escalation of statin therapy. 3. Start isosorbide mononitrate 30 mg daily; if patient experiences side effects, he can decrease the dose to 15 mg daily. 4. Proceed with transthoracic echocardiogram, as previously ordered.  Nelva Bush, MD [facility] Pager: [phone]   Indications   Atypical chest pain [R07.89 (ICD-10-CM)]  Abnormal stress test [R94.39 (ICD-10-CM)]  Procedural Details/Technique   Technical Details Indication: 56 y.o. year-old man with history of CAD s/p inferolateral STEMI in 2014 s/p DES to the mid LCx (also with moderate to severe LAD disease and chronic total occlusion of RCA at that time), ischemic cardiomyopathy, diabetes mellitus, hypertension, hyperlipidemia, and peptic ulcer disease, who presents for evaluation of atypical chest pain. He has noted soreness of the chest wall (particularly with palpation) as well as occasional stabbing chest pains over the last month. He presented to the ED last month a rules out for acute MI. Subsequent outpatient myocardial perfusion stress test was high risk with large inferolateral infarct, as wall as anterior ischemia/scar. He has therefore been referred for [facility] with possible PCI.  GFR: 104 ml/min  Procedure: The risks, benefits, complications, treatment options, and expected outcomes were discussed with the patient. The patient and/or family concurred with the proposed plan, giving informed consent. The patient was brought to the cath lab after IV hydration was begun and oral premedication was given. The patient was further  sedated with Versed and Fentanyl. The right wrist was assessed with a modified Allens test which was normal. The right wrist was prepped and draped in a sterile fashion. 1% lidocaine was used for local anesthesia. Using the modified Seldinger access technique, a 475F slender Glidesheath was placed in the right radial artery. 3 mg Verapamil was given through the sheath. Heparin 5,000 units were administered.  Selective coronary angiography was performed using 75F JL3.5 and JR4 catheters to engage  the left and right coronary arteries, respectively. Left heart catheterization was performed using a 56F pigtail catheter. Left ventriculogram was performed with a power injection of contrast.  FFR of LAD: Heparin was used for anticoagulation. The left coronary artery was engaged with a 56F EBU3 guide catheter. A Comet pressure wire was advanced into the distal LAD, where resting Pd/Pa was obtained (Pd/Pa 0.72). Given markedly positive Pd/Pa, adenosine was not administered. Final angiogram demonstrates stable appearance of the left coronary artery.  At the end of the procedure, the radial artery sheath was removed and a TR band applied to achieve patent hemostasis. There were no immediate complications. The patient was taken to the recovery area in stable condition.  Contrast used: 100 mL Isovue Fluoroscopy time: 7.1 min Radiation dose: 780 mGy   Estimated blood loss <50 mL.  During this procedure the patient was administered the following to achieve and maintain moderate conscious sedation: Versed 1 mg, Fentanyl 50 mcg, while the patient's heart rate, blood pressure, and oxygen saturation were continuously monitored. The period of conscious sedation was 51 minutes, of which I was present face-to-face 100% of this time.    Complications   Complications documented before study signed (04/13/2016 6:39 PM EDT)    No complications were associated with this study.  Documented by Nelva Bush, MD - 04/13/2016  6:32 PM EDT    Coronary Findings   Dominance: Right  Left Main  Vessel is large.  Left Anterior Descending  Vessel is large.  Ost LAD lesion, 50% stenosed. The lesion is eccentric. The lesion is moderately calcified.  Prox LAD lesion, 70% stenosed. The lesion is irregular.  Mid LAD lesion, 70% stenosed. The lesion is focal.  Dist LAD lesion, 30% stenosed.  First Diagonal Branch  Vessel is small in size. There is mild disease in the vessel.  Second Diagonal Branch  Vessel is small in size. There is mild disease in the vessel.  Third Diagonal Branch  Vessel is small in size.  Ramus Intermedius  Vessel is small.  Left Circumflex  Vessel is large.  Prox Cx lesion, 30% stenosed. The lesion is eccentric.  Mid Cx lesion, 50% stenosed. The lesion was previously treated using a drug eluting stent over 2 years ago. Previously placed stent displays restenosis.  Dist Cx lesion, 80% stenosed.  First Obtuse Marginal Branch  Vessel is moderate in size.  Second Obtuse Marginal Branch  Vessel is moderate in size.  2nd Mrg lesion, 90% stenosed.  Third Obtuse Marginal Branch  Vessel is small in size.  Right Coronary Artery  Vessel is moderate in size. Mid RCA filled by collaterals from Prox RCA.  Mid RCA lesion, 100% stenosed. The lesion is chronically occluded with bridging and left-to-right collateral flow.  Right Posterior Descending Artery  Vessel is small in size.  Right Posterior Atrioventricular Branch  Vessel is moderate in size.  First Right Posterolateral  1st RPLB filled by collaterals from Dist Cx.  Wall Motion              Left Heart   Left Ventricle There is moderate left ventricular systolic dysfunction. LVEF 35-40%. Left ventricular filling pressure is upper normal; LVEDP 15-18 mmHg.    Aortic Valve There is no aortic valve stenosis.    Coronary Diagrams   Diagnostic Diagram       Implants        No implant documentation for this case.  PACS  Images   Show images for Cardiac catheterization  Link to Procedure Log   Procedure Log    Hemo Data    Most Recent Value  AO Systolic Pressure 937 mmHg  AO Diastolic Pressure 64 mmHg  AO Mean 90 mmHg  LV Systolic Pressure 169 mmHg  LV Diastolic Pressure 7 mmHg  LV EDP 15 mmHg  Arterial Occlusion Pressure Extended Systolic Pressure 678 mmHg  Arterial Occlusion Pressure Extended Diastolic Pressure 71 mmHg  Arterial Occlusion Pressure Extended Mean Pressure 95 mmHg  Left Ventricular Apex Extended Systolic Pressure 938 mmHg  Left Ventricular Apex Extended Diastolic Pressure 9 mmHg  Left Ventricular Apex Extended EDP Pressure 16 mmHg    Impression:  This 56 year old gentleman has severe 3-vessel CAD with moderate LV dysfunction with prior inferior-posterior MI in 2014 treated with a DES. He now has recurrent chest pain with exertion and at rest and fatigue and I agree that the best treatment is CABG. I discussed the operative procedure with the patient and his wife including alternatives, benefits and risks; including but not limited to bleeding, blood transfusion, infection, stroke, myocardial infarction, graft failure, heart block requiring a permanent pacemaker, organ dysfunction, and death.  Savas Armor understands and agrees to proceed.     Plan:  CABG    Gaye Pollack, MD [facility] [phone]

## 2016-04-23 NOTE — Op Note (Signed)
CARDIOVASCULAR SURGERY OPERATIVE NOTE  04/23/2016  Surgeon:  Gaye Pollack, MD  First Assistant: Ellwood Handler,  PA-C   Preoperative Diagnosis:  Severe multi-vessel coronary artery disease   Postoperative Diagnosis:  Same   Procedure:  1. Median Sternotomy 2. Extracorporeal circulation 3.   Coronary artery bypass grafting x 4   Left internal mammary graft to the LAD  Sequential SVG to OM1 and OM2  SVG to distal RCA  4.   Endoscopic vein harvest from the right leg   Anesthesia:  General Endotracheal   Clinical History/Surgical Indication:  The patient is a 56 year old gentleman with poorly controlled DM with neuropathy, HTN, hyperlipidemia, family history of premature CAD who suffered an inferior-posterior STEMI in 04/2012 treated with a DES to the mid LCX. He had some residual disease treated medically. He has not returned for medical follow up until recently and was off of his medications for a while due to losing his insurance and not being able to afford them. 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 He ruled out for MI and had an outpt stress test that was a high risk study with an EF of <30%. He underwent cath on 04/13/2016 showing severe 3-vessel CAD with an LVEF of 35-40%. He continues to have intermittent deep sternal pain that feels like a knife going through his chest. It occurs with exertion and at rest. It is felt that CABG is the best treatment for him. I discussed the operative procedure with the patient and his wifeincluding alternatives, benefits and risks; including but not limited to bleeding, blood transfusion, infection, stroke, myocardial infarction, graft failure, heart block requiring a permanent pacemaker, organ dysfunction, and death. Lendon Chrismonunderstands and agrees to proceed.     Preparation:  The patient was seen in the preoperative holding area and the correct patient, correct operation were confirmed with the patient after reviewing the medical record and catheterization. The consent was signed by me. Preoperative antibiotics were given. A pulmonary arterial line and radial arterial line were placed by the anesthesia team. The patient was taken back to the operating room and positioned supine on the operating room table. After being placed under general endotracheal anesthesia by the anesthesia team a foley catheter was placed. The neck, chest, abdomen, and both legs were prepped with betadine soap and solution and draped in the usual sterile manner. A surgical time-out was taken and the correct patient and operative procedure were confirmed with the nursing and anesthesia staff.   Cardiopulmonary Bypass:  A median sternotomy was performed. The pericardium was opened in the midline. Right ventricular function appeared normal. The ascending aorta was of normal size and had no palpable plaque. There were no contraindications to aortic cannulation or cross-clamping. The patient was fully systemically heparinized and the ACT was maintained > 400 sec. The proximal aortic arch was cannulated with a 20 F aortic cannula for arterial inflow. Venous cannulation was performed via the right atrial appendage using a two-staged venous cannula. An antegrade cardioplegia/vent cannula was inserted into the mid-ascending aorta. Aortic occlusion was performed with a single cross-clamp. Systemic cooling to 32 degrees Centigrade and topical cooling of the heart with iced saline were used. Hyperkalemic antegrade cold blood cardioplegia was used to induce diastolic arrest and was then given at about 20 minute intervals throughout the period of arrest to maintain myocardial temperature at or below 10 degrees centigrade. A temperature probe was inserted into the  interventricular septum and an  insulating pad was placed in the pericardium.   Left internal mammary harvest:  The left side of the sternum was retracted using the Rultract retractor. The left internal mammary artery was harvested as a pedicle graft. All side branches were clipped. It was a medium-sized vessel of good quality with excellent blood flow. It was ligated distally and divided. It was sprayed with topical papaverine solution to prevent vasospasm.   Endoscopic vein harvest:  The right greater saphenous vein was harvested endoscopically through a 2 cm incision medial to the right knee. It was harvested from the upper thigh to below the knee. It was a medium-sized vein of good quality. The side branches were all ligated with 4-0 silk ties.    Coronary arteries:  The coronary arteries were examined.   LAD:  Large vessel with diffuse proximal disease, segmental mid vessel disease and no distal disease.  LCX:  OM1 and OM2 both medium sized vessels with proximal plaque but no distal disease.  RCA:  Distal vessel proximal to PDA soft with minimal disease. The PDA and PL branches were small. Old infero-posterior MI.   Grafts:  1. LIMA to the LAD: 2.0 mm. It was sewn end to side using 8-0 prolene continuous suture. 2. SVG to distal RCA:  2.5 mm. It was sewn end to side using 7-0 prolene continuous suture. 3. Sequential SVG to OM1:  1.6 mm. It was sewn sequential side to side using 7-0 prolene continuous suture. 4. Sequential SVG to OM2:  1.6 mm. It was sewn sequential end to side using 7-0 prolene continuous suture.  The proximal vein graft anastomoses were performed to the mid-ascending aorta using continuous 6-0 prolene suture. Graft markers were placed around the proximal anastomoses.   Completion:  The patient was rewarmed to 37 degrees Centigrade. The clamp was removed from the LIMA pedicle and there was rapid warming of the septum and return of ventricular fibrillation. The crossclamp was removed with a  time of 81 minutes. There was spontaneous return of sinus rhythm. The distal and proximal anastomoses were checked for hemostasis. The position of the grafts was satisfactory. Two temporary epicardial pacing wires were placed on the right atrium and two on the right ventricle. The patient was weaned from CPB without difficulty on no inotropes. CPB time was 102 minutes. Cardiac output was 5.5 LPM. Heparin was fully reversed with protamine and the aortic and venous cannulas removed. Hemostasis was achieved. Mediastinal and left pleural drainage tubes were placed. The sternum was closed with double #6 stainless steel wires. The fascia was closed with continuous # 1 vicryl suture. The subcutaneous tissue was closed with 2-0 vicryl continuous suture. The skin was closed with 3-0 vicryl subcuticular suture. All sponge, needle, and instrument counts were reported correct at the end of the case. Dry sterile dressings were placed over the incisions and around the chest tubes which were connected to pleurevac suction. The patient was then transported to the surgical intensive care unit in critical but stable condition.

## 2016-04-23 NOTE — Brief Op Note (Signed)
04/23/2016  11:21 AM  PATIENT:  Juan Ray  56 y.o. male  PRE-OPERATIVE DIAGNOSIS:  CAD  POST-OPERATIVE DIAGNOSIS:  CAD  PROCEDURE:  Procedure(s):  CORONARY ARTERY BYPASS GRAFTING (CABG) x 4  -LIMA to LAD -SEQ SVG to OM1 and OM2 -SVG to DISTAL RCA  ENDOSCOPIC HARVEST GREATER SAPHENOUS VEIN -Right Leg  TRANSESOPHAGEAL ECHOCARDIOGRAM (TEE) (N/A)   SURGEON:  Surgeon(s) and Role:    * Gaye Pollack, MD - Primary  PHYSICIAN ASSISTANT: Ellwood Handler PA-C  ANESTHESIA:   general  EBL:  Total I/O In: -  Out: 150 [Urine:150]  BLOOD ADMINISTERED: CELLSAVER  DRAINS: Left Pleural Chest Tubes, Mediastinal Chest Drains   LOCAL MEDICATIONS USED:  NONE  SPECIMEN:  No Specimen  DISPOSITION OF SPECIMEN:  N/A  COUNTS:  YES  TOURNIQUET:  * No tourniquets in log *  DICTATION: .Dragon Dictation  PLAN OF CARE: Admit to inpatient   PATIENT DISPOSITION:  ICU - intubated and hemodynamically stable.   Delay start of Pharmacological VTE agent (>24hrs) due to surgical blood loss or risk of bleeding: yes

## 2016-04-23 NOTE — Anesthesia Procedure Notes (Addendum)
Central Venous Catheter Insertion Performed by: Suzette Battiest, anesthesiologist Start/End4/23/2018 6:50 AM, 04/23/2016 7:00 AM Patient location: Pre-op. Preanesthetic checklist: patient identified, IV checked, site marked, risks and benefits discussed, surgical consent, monitors and equipment checked, pre-op evaluation, timeout performed and anesthesia consent Hand hygiene performed  and maximum sterile barriers used  PA cath was placed.Swan type:thermodilution Procedure performed using ultrasound guided technique. Ultrasound Notes:anatomy identified, needle tip was noted to be adjacent to the nerve/plexus identified, no ultrasound evidence of intravascular and/or intraneural injection and image(s) printed for medical record Attempts: 1 Patient tolerated the procedure well with no immediate complications.

## 2016-04-23 NOTE — Progress Notes (Signed)
  Echocardiogram Echocardiogram Transesophageal has been performed.  Dosia Yodice L Androw 04/23/2016, 11:56 AM

## 2016-04-23 NOTE — Interval H&P Note (Signed)
History and Physical Interval Note:  04/23/2016 5:44 AM  Juan Ray  has presented today for surgery, with the diagnosis of CAD  The various methods of treatment have been discussed with the patient and family. After consideration of risks, benefits and other options for treatment, the patient has consented to  Procedure(s): CORONARY ARTERY BYPASS GRAFTING (CABG) (N/A) TRANSESOPHAGEAL ECHOCARDIOGRAM (TEE) (N/A) as a surgical intervention .  The patient's history has been reviewed, patient examined, no change in status, stable for surgery.  I have reviewed the patient's chart and labs.  Questions were answered to the patient's satisfaction.     Gaye Pollack

## 2016-04-23 NOTE — Transfer of Care (Signed)
Immediate Anesthesia Transfer of Care Note  Patient: Juan Ray  Procedure(s) Performed: Procedure(s): CORONARY ARTERY BYPASS GRAFTING (CABG) x 4 (N/A) TRANSESOPHAGEAL ECHOCARDIOGRAM (TEE) (N/A) ENDOVEIN HARVEST OF GREATER SAPHENOUS VEIN (Right)  Patient Location: SICU  Anesthesia Type:General  Level of Consciousness: Patient remains intubated per anesthesia plan  Airway & Oxygen Therapy: Patient remains intubated per anesthesia plan and Patient placed on Ventilator (see vital sign flow sheet for setting)  Post-op Assessment: Report given to RN and Post -op Vital signs reviewed and stable  Post vital signs: Reviewed and stable  Last Vitals:  Vitals:   04/23/16 0552  BP: 126/69  Pulse: 65  Resp: 20  Temp: 36.7 C    Last Pain:  Vitals:   04/23/16 0552  TempSrc: Oral      Patients Stated Pain Goal: 3 (88/50/27 7412)  Complications: No apparent anesthesia complications

## 2016-04-23 NOTE — Progress Notes (Signed)
Pt dangled and stood at beside. BP decreased to MAP 65. Pt asymptomatic. Pt tolerated well. RN will continue to monitor.

## 2016-04-23 NOTE — Procedures (Signed)
Extubation Procedure Note  Patient Details:   Name: Nashon Erbes DOB: 05/29/1960 MRN: [id number]   Airway Documentation:     Evaluation  O2 sats: stable throughout Complications: No apparent complications Patient did tolerate procedure well. Bilateral breath sounds: Diminished    Patient extubated to 4 L [place]. No distress noted. Will continue to monitor.    Mali M Phenix Vandermeulen 04/23/2016, 4:27 PM

## 2016-04-23 NOTE — Anesthesia Preprocedure Evaluation (Addendum)
Anesthesia Evaluation  Patient identified by MRN, date of birth, ID band Patient awake    Reviewed: Allergy & Precautions, NPO status , Patient's Chart, lab work & pertinent test results, reviewed documented beta blocker date and time   History of Anesthesia Complications Negative for: history of anesthetic complications  Airway Mallampati: III  TM Distance: >3 FB Neck ROM: Full    Dental  (+) Edentulous Upper, Edentulous Lower, Dental Advisory Given   Pulmonary former smoker,    breath sounds clear to auscultation       Cardiovascular hypertension, Pt. on medications and Pt. on home beta blockers + CAD and + Past MI   Rhythm:Regular     Neuro/Psych Anxiety negative neurological ROS     GI/Hepatic PUD, GERD  ,  Endo/Other  diabetes, Type 2  Renal/GU      Musculoskeletal   Abdominal   Peds  Hematology   Anesthesia Other Findings   Reproductive/Obstetrics                            Anesthesia Physical Anesthesia Plan  ASA: IV  Anesthesia Plan: General   Post-op Pain Management:    Induction: Intravenous  Airway Management Planned: Oral ETT  Additional Equipment: Arterial line, TEE, CVP, PA Cath and Ultrasound Guidance Line Placement  Intra-op Plan:   Post-operative Plan: Post-operative intubation/ventilation  Informed Consent: I have reviewed the patients History and Physical, chart, labs and discussed the procedure including the risks, benefits and alternatives for the proposed anesthesia with the patient or authorized representative who has indicated his/her understanding and acceptance.   Dental advisory given  Plan Discussed with: CRNA and Surgeon  Anesthesia Plan Comments:         Anesthesia Quick Evaluation

## 2016-04-24 ENCOUNTER — Encounter: Payer: Self-pay | Admitting: Family Medicine

## 2016-04-24 ENCOUNTER — Inpatient Hospital Stay (HOSPITAL_COMMUNITY): Payer: BLUE CROSS/BLUE SHIELD

## 2016-04-24 ENCOUNTER — Encounter (HOSPITAL_COMMUNITY): Payer: Self-pay | Admitting: Surgery

## 2016-04-24 LAB — ECHO TEE
AO mean calculated velocity dopler: 83.8 cm/s
AV Area VTI index: 1.08 cm2/m2
AV Area VTI: 2.86 cm2
AV Area mean vel: 2.54 cm2
AV Mean grad: 3 mmHg
AV Peak grad: 5 mmHg
AV VEL mean LVOT/AV: 0.61
AV area mean vel ind: 1.14 cm2/m2
AV peak Index: 1.28
AV pk vel: 108 cm/s
AV vel: 2.41
Ao pk vel: 0.69 m/s
LVOT MV VTI INDEX: 1.56 cm2/m2
LVOT MV VTI: 3.48
LVOT SV: 58 mL
LVOT VTI: 14 cm
LVOT area: 4.15 cm2
LVOT diameter: 23 mm
LVOT peak VTI: 0.58 cm
LVOT peak vel: 74.4 cm/s
MV Annulus VTI: 16.7 cm
MV M vel: 28.6
Mean grad: 0 mmHg
VTI: 24.1 cm
Valve area index: 1.08
Valve area: 2.41 cm2

## 2016-04-24 LAB — GLUCOSE, CAPILLARY
GLUCOSE-CAPILLARY: 107 mg/dL — AB (ref 65–99)
GLUCOSE-CAPILLARY: 109 mg/dL — AB (ref 65–99)
GLUCOSE-CAPILLARY: 110 mg/dL — AB (ref 65–99)
GLUCOSE-CAPILLARY: 111 mg/dL — AB (ref 65–99)
GLUCOSE-CAPILLARY: 122 mg/dL — AB (ref 65–99)
GLUCOSE-CAPILLARY: 150 mg/dL — AB (ref 65–99)
GLUCOSE-CAPILLARY: 150 mg/dL — AB (ref 65–99)
GLUCOSE-CAPILLARY: 95 mg/dL (ref 65–99)
Glucose-Capillary: 126 mg/dL — ABNORMAL HIGH (ref 65–99)
Glucose-Capillary: 112 mg/dL — ABNORMAL HIGH (ref 65–99)
Glucose-Capillary: 119 mg/dL — ABNORMAL HIGH (ref 65–99)
Glucose-Capillary: 109 mg/dL — ABNORMAL HIGH (ref 65–99)
Glucose-Capillary: 105 mg/dL — ABNORMAL HIGH (ref 65–99)
Glucose-Capillary: 135 mg/dL — ABNORMAL HIGH (ref 65–99)
Glucose-Capillary: 132 mg/dL — ABNORMAL HIGH (ref 65–99)
Glucose-Capillary: 163 mg/dL — ABNORMAL HIGH (ref 65–99)

## 2016-04-24 LAB — CBC
HCT: 34.6 % — ABNORMAL LOW (ref 39.0–52.0)
HEMATOCRIT: 31 % — AB (ref 39.0–52.0)
HEMATOCRIT: 33.6 % — AB (ref 39.0–52.0)
HEMOGLOBIN: 11 g/dL — AB (ref 13.0–17.0)
Hemoglobin: 12.1 g/dL — ABNORMAL LOW (ref 13.0–17.0)
Hemoglobin: 11.5 g/dL — ABNORMAL LOW (ref 13.0–17.0)
MCH: 31.7 pg (ref 26.0–34.0)
MCH: 32.4 pg (ref 26.0–34.0)
MCH: 31.6 pg (ref 26.0–34.0)
MCHC: 35 g/dL (ref 30.0–36.0)
MCHC: 35.5 g/dL (ref 30.0–36.0)
MCHC: 34.2 g/dL (ref 30.0–36.0)
MCV: 90.6 fL (ref 78.0–100.0)
MCV: 91.2 fL (ref 78.0–100.0)
MCV: 92.3 fL (ref 78.0–100.0)
PLATELETS: 208 10*3/uL (ref 150–400)
PLATELETS: 183 10*3/uL (ref 150–400)
Platelets: 192 10*3/uL (ref 150–400)
RBC: 3.82 MIL/uL — ABNORMAL LOW (ref 4.22–5.81)
RBC: 3.4 MIL/uL — ABNORMAL LOW (ref 4.22–5.81)
RBC: 3.64 MIL/uL — AB (ref 4.22–5.81)
RDW: 13.1 % (ref 11.5–15.5)
RDW: 13.5 % (ref 11.5–15.5)
RDW: 13.3 % (ref 11.5–15.5)
WBC: 20.2 10*3/uL — ABNORMAL HIGH (ref 4.0–10.5)
WBC: 17.2 10*3/uL — ABNORMAL HIGH (ref 4.0–10.5)
WBC: 18.9 10*3/uL — AB (ref 4.0–10.5)

## 2016-04-24 LAB — POCT I-STAT, CHEM 8
BUN: 18 mg/dL (ref 6–20)
CALCIUM ION: 1.21 mmol/L (ref 1.15–1.40)
CREATININE: 0.8 mg/dL (ref 0.61–1.24)
Chloride: 99 mmol/L — ABNORMAL LOW (ref 101–111)
Glucose, Bld: 160 mg/dL — ABNORMAL HIGH (ref 65–99)
HEMATOCRIT: 31 % — AB (ref 39.0–52.0)
HEMOGLOBIN: 10.5 g/dL — AB (ref 13.0–17.0)
Potassium: 4.1 mmol/L (ref 3.5–5.1)
Sodium: 137 mmol/L (ref 135–145)
TCO2: 26 mmol/L (ref 0–100)

## 2016-04-24 LAB — BASIC METABOLIC PANEL
Anion gap: 6 (ref 5–15)
BUN: 13 mg/dL (ref 6–20)
CO2: 25 mmol/L (ref 22–32)
CREATININE: 0.69 mg/dL (ref 0.61–1.24)
Calcium: 8 mg/dL — ABNORMAL LOW (ref 8.9–10.3)
Chloride: 105 mmol/L (ref 101–111)
GFR calc Af Amer: >60 mL/min (ref >60)
GFR calc non Af Amer: >60 mL/min (ref >60)
Glucose, Bld: 112 mg/dL — ABNORMAL HIGH (ref 65–99)
Potassium: 3.8 mmol/L (ref 3.5–5.1)
SODIUM: 136 mmol/L (ref 135–145)

## 2016-04-24 LAB — CREATININE, SERUM
Creatinine, Ser: 0.68 mg/dL (ref 0.61–1.24)
Creatinine, Ser: 0.8 mg/dL (ref 0.61–1.24)

## 2016-04-24 LAB — MAGNESIUM
MAGNESIUM: 2.1 mg/dL (ref 1.7–2.4)
MAGNESIUM: 1.9 mg/dL (ref 1.7–2.4)

## 2016-04-24 MED ORDER — PNEUMOCOCCAL VAC POLYVALENT 25 MCG/0.5ML IJ INJ
0.5000 mL | INJECTION | INTRAMUSCULAR | Status: AC
  Administered 2016-04-26: 0.5 mL via INTRAMUSCULAR

## 2016-04-24 MED ORDER — INSULIN ASPART 100 UNIT/ML ~~LOC~~ SOLN
0.0000 [IU] | SUBCUTANEOUS | Status: DC
  Administered 2016-04-24: 2 [IU] via SUBCUTANEOUS
  Administered 2016-04-24: 4 [IU] via SUBCUTANEOUS
  Administered 2016-04-25: 2 [IU] via SUBCUTANEOUS
  Administered 2016-04-25 (×2): 4 [IU] via SUBCUTANEOUS
  Administered 2016-04-25: 8 [IU] via SUBCUTANEOUS

## 2016-04-24 MED ORDER — ENOXAPARIN SODIUM 40 MG/0.4ML ~~LOC~~ SOLN
40.0000 mg | Freq: Every day | SUBCUTANEOUS | Status: DC
Start: 2016-04-24 — End: 2016-04-27
  Administered 2016-04-24 – 2016-04-26 (×3): 40 mg via SUBCUTANEOUS
  Filled 2016-04-24 (×3): qty 0.4

## 2016-04-24 MED ORDER — INSULIN DETEMIR 100 UNIT/ML ~~LOC~~ SOLN
20.0000 [IU] | Freq: Once | SUBCUTANEOUS | Status: AC
  Administered 2016-04-24: 20 [IU] via SUBCUTANEOUS
  Filled 2016-04-24: qty 0.2

## 2016-04-24 MED ORDER — INSULIN DETEMIR 100 UNIT/ML ~~LOC~~ SOLN
20.0000 [IU] | Freq: Every day | SUBCUTANEOUS | Status: DC
  Administered 2016-04-25: 20 [IU] via SUBCUTANEOUS
  Filled 2016-04-24 (×2): qty 0.2

## 2016-04-24 MED FILL — Mannitol IV Soln 20%: INTRAVENOUS | Qty: 500 | Status: AC

## 2016-04-24 MED FILL — Sodium Bicarbonate IV Soln 8.4%: INTRAVENOUS | Qty: 50 | Status: AC

## 2016-04-24 MED FILL — Lidocaine HCl IV Inj 20 MG/ML: INTRAVENOUS | Qty: 5 | Status: AC

## 2016-04-24 MED FILL — Heparin Sodium (Porcine) Inj 1000 Unit/ML: INTRAMUSCULAR | Qty: 10 | Status: AC

## 2016-04-24 MED FILL — Electrolyte-R (PH 7.4) Solution: INTRAVENOUS | Qty: 4000 | Status: AC

## 2016-04-24 MED FILL — Sodium Chloride IV Soln 0.9%: INTRAVENOUS | Qty: 2000 | Status: AC

## 2016-04-24 NOTE — Care Management Note (Signed)
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 Anticipate return home- CM to follow for pt progression and d/c needs   Expected Discharge Date:                  Expected Discharge Plan:  Home/Self Care  In-House Referral:     Discharge planning Services  CM Consult  Post Acute Care Choice:    Choice offered to:     DME Arranged:    DME Agency:     HH Arranged:    HH Agency:     Status of Service:  In process, will continue to follow  If discussed at Long Length of Stay Meetings, dates discussed:    Discharge Disposition:   Additional Comments:  Dawayne Patricia, RN 04/24/2016, 12:38 PM

## 2016-04-24 NOTE — Anesthesia Postprocedure Evaluation (Addendum)
Anesthesia Post Note  Patient: Juan Ray  Procedure(s) Performed: Procedure(s) (LRB): CORONARY ARTERY BYPASS GRAFTING (CABG) x 4 (N/A) TRANSESOPHAGEAL ECHOCARDIOGRAM (TEE) (N/A) ENDOVEIN HARVEST OF GREATER SAPHENOUS VEIN (Right)  Patient location during evaluation: ICU Anesthesia Type: General Level of consciousness: sedated Pain management: pain level controlled Vital Signs Assessment: post-procedure vital signs reviewed and stable Respiratory status: respiratory function stable and patient remains intubated per anesthesia plan Cardiovascular status: stable Anesthetic complications: no       Last Vitals:  Vitals:   04/24/16 2000 04/24/16 2100  BP: 107/66 98/69  Pulse: 90 90  Resp: 11 (!) 21  Temp:      Last Pain:  Vitals:   04/24/16 2050  TempSrc:   PainSc: 6                  Garnette Greb

## 2016-04-24 NOTE — Progress Notes (Signed)
1 Day Post-Op Procedure(s) (LRB): CORONARY ARTERY BYPASS GRAFTING (CABG) x 4 (N/A) TRANSESOPHAGEAL ECHOCARDIOGRAM (TEE) (N/A) ENDOVEIN HARVEST OF GREATER SAPHENOUS VEIN (Right) Subjective:  Chest sore but otherwise ok  Objective: Vital signs in last 24 hours: Temp:  [97.7 F (36.5 C)-100.2 F (37.9 C)] 98.8 F (37.1 C) (04/24 0700) Pulse Rate:  [90-108] 91 (04/24 0700) Cardiac Rhythm: Heart block (04/24 0400) Resp:  [0-27] 14 (04/24 0700) BP: (86-119)/(59-84) 96/65 (04/24 0700) SpO2:  [94 %-100 %] 99 % (04/24 0700) Arterial Line BP: (70-273)/(32-267) 70/64 (04/24 0700) FiO2 (%):  [40 %-50 %] 40 % (04/23 1524) Weight:  [108.9 kg (240 lb 1.3 oz)-112.3 kg (247 lb 9.6 oz)] 112.3 kg (247 lb 9.6 oz) (04/24 0500)  Hemodynamic parameters for last 24 hours: PAP: (18-31)/(10-21) 29/19 CO:  [2.2 L/min-6.9 L/min] 5.8 L/min CI:  [2.5 L/min/m2-3.1 L/min/m2] 2.6 L/min/m2  Intake/Output from previous day: 04/23 0701 - 04/24 0700 In: 4731.4 [P.O.:160; I.V.:3466.4; Blood:375; NG/GT:30; IV Piggyback:700] Out: 9675 [Urine:1770; Blood:900; Chest Tube:460] Intake/Output this shift: No intake/output data recorded.  General appearance: alert and cooperative Neurologic: intact Heart: regular rate and rhythm, S1, S2 normal, no murmur, click, rub or gallop Lungs: clear to auscultation bilaterally Extremities: edema mile Wound: dressing dry  Lab Results:  Recent Labs  04/23/16 1903 04/23/16 1913 04/24/16 0350  WBC 20.8*  --  20.2*  HGB 12.8* 11.9* 12.1*  HCT 36.1* 35.0* 34.6*  PLT 208  --  208   BMET:  Recent Labs  04/23/16 1913 04/24/16 0350  NA 140 136  K 4.1 3.8  CL 105 105  CO2  --  25  GLUCOSE 128* 112*  BUN 13 13  CREATININE 0.70 0.69  CALCIUM  --  8.0*    PT/INR:  Recent Labs  04/23/16 1338  LABPROT 15.8*  INR 1.26   ABG    Component Value Date/Time   PHART 7.369 04/23/2016 1737   HCO3 25.7 04/23/2016 1737   TCO2 26 04/23/2016 1913   ACIDBASEDEF 0.8  04/19/2016 1109   O2SAT 99.0 04/23/2016 1737   CBG (last 3)   Recent Labs  04/24/16 0459 04/24/16 0558 04/24/16 0658  GLUCAP 95 135* 110*   CXR: clear  ECG: NSR, old IMI  Assessment/Plan: S/P Procedure(s) (LRB): CORONARY ARTERY BYPASS GRAFTING (CABG) x 4 (N/A) TRANSESOPHAGEAL ECHOCARDIOGRAM (TEE) (N/A) ENDOVEIN HARVEST OF GREATER SAPHENOUS VEIN (Right)  He is hemodynamically stable on neo. Wean as tolerated. Will hold off on beta blocker until stable off neo. Mobilize Diuresis Diabetes control: start Levemir and SSI, DC drip d/c tubes/lines Continue foley due to patient in ICU and urinary output monitoring See progression orders   LOS: 1 day    Gaye Pollack 04/24/2016

## 2016-04-24 NOTE — Progress Notes (Signed)
Patient ID: Juan Ray, male   DOB: [birth year], 58 y.o.   MRN: [id number] EVENING ROUNDS NOTE :     [address]       [place] [address]             [phone]                 1 Day Post-Op Procedure(s) (LRB): CORONARY ARTERY BYPASS GRAFTING (CABG) x 4 (N/A) TRANSESOPHAGEAL ECHOCARDIOGRAM (TEE) (N/A) ENDOVEIN HARVEST OF GREATER SAPHENOUS VEIN (Right)  Total Length of Stay:  LOS: 1 day  BP 115/65   Pulse 90   Temp 98.3 F (36.8 C)   Resp 17   Ht 5\' 9"  (1.753 m)   Wt 247 lb 9.6 oz (112.3 kg)   SpO2 96%   BMI 36.56 kg/m   .Intake/Output      04/23 0701 - 04/24 0700 04/24 0701 - 04/25 0700   P.O. 160 720   I.V. (mL/kg) 3466.4 (30.9) 488.1 (4.3)   Blood 375    NG/GT 30    IV Piggyback 700    Total Intake(mL/kg) 4731.4 (42.1) 1208.1 (10.8)   Urine (mL/kg/hr) 1770 (0.7) 225 (0.2)   Blood 900 (0.3)    Chest Tube 460 (0.2) 0 (0)   Total Output 3130 225   Net +1601.4 +983.1          . sodium chloride Stopped (04/24/16 1400)  . sodium chloride    . sodium chloride    . cefUROXime (ZINACEF)  IV Stopped (04/24/16 1700)  . lactated ringers Stopped (04/23/16 1500)  . lactated ringers    . lactated ringers 20 mL/hr at 04/24/16 0800  . nitroGLYCERIN Stopped (04/23/16 1330)  . phenylephrine (NEO-SYNEPHRINE) Adult infusion Stopped (04/24/16 1700)     Lab Results  Component Value Date   WBC 18.9 (H) 04/24/2016   HGB 10.5 (L) 04/24/2016   HCT 31.0 (L) 04/24/2016   PLT 183 04/24/2016   GLUCOSE 160 (H) 04/24/2016   CHOL 139 01/27/2016   TRIG 182 (H) 01/27/2016   HDL 25 (L) 01/27/2016   LDLCALC 78 01/27/2016   ALT 30 04/19/2016   AST 25 04/19/2016   NA 137 04/24/2016   K 4.1 04/24/2016   CL 99 (L) 04/24/2016   CREATININE 0.80 04/24/2016   BUN 18 04/24/2016   CO2 25 04/24/2016   TSH 0.354 03/11/2016   INR 1.26 04/23/2016   HGBA1C 7.8 (H) 04/19/2016   MICROALBUR 6.3 01/27/2016   Stable day, off all  drips   Grace Isaac MD  Beeper  [phone] Office [phone] 04/24/2016 6:41 PM

## 2016-04-25 ENCOUNTER — Inpatient Hospital Stay (HOSPITAL_COMMUNITY): Payer: BLUE CROSS/BLUE SHIELD

## 2016-04-25 LAB — BASIC METABOLIC PANEL
Anion gap: 6 (ref 5–15)
BUN: 15 mg/dL (ref 6–20)
CALCIUM: 8 mg/dL — AB (ref 8.9–10.3)
CHLORIDE: 101 mmol/L (ref 101–111)
CO2: 26 mmol/L (ref 22–32)
Creatinine, Ser: 0.71 mg/dL (ref 0.61–1.24)
GFR calc non Af Amer: >60 mL/min (ref >60)
Glucose, Bld: 155 mg/dL — ABNORMAL HIGH (ref 65–99)
Potassium: 4 mmol/L (ref 3.5–5.1)
Sodium: 133 mmol/L — ABNORMAL LOW (ref 135–145)

## 2016-04-25 LAB — CBC
HCT: 32.2 % — ABNORMAL LOW (ref 39.0–52.0)
Hemoglobin: 11 g/dL — ABNORMAL LOW (ref 13.0–17.0)
MCH: 31.4 pg (ref 26.0–34.0)
MCHC: 34.2 g/dL (ref 30.0–36.0)
MCV: 92 fL (ref 78.0–100.0)
PLATELETS: 158 10*3/uL (ref 150–400)
RBC: 3.5 MIL/uL — AB (ref 4.22–5.81)
RDW: 13.3 % (ref 11.5–15.5)
WBC: 17.7 10*3/uL — ABNORMAL HIGH (ref 4.0–10.5)

## 2016-04-25 LAB — GLUCOSE, CAPILLARY
GLUCOSE-CAPILLARY: 216 mg/dL — AB (ref 65–99)
Glucose-Capillary: 146 mg/dL — ABNORMAL HIGH (ref 65–99)
Glucose-Capillary: 147 mg/dL — ABNORMAL HIGH (ref 65–99)
Glucose-Capillary: 165 mg/dL — ABNORMAL HIGH (ref 65–99)
Glucose-Capillary: 172 mg/dL — ABNORMAL HIGH (ref 65–99)
Glucose-Capillary: 225 mg/dL — ABNORMAL HIGH (ref 65–99)

## 2016-04-25 MED ORDER — INSULIN ASPART 100 UNIT/ML ~~LOC~~ SOLN
0.0000 [IU] | Freq: Three times a day (TID) | SUBCUTANEOUS | Status: DC
  Administered 2016-04-25 – 2016-04-26 (×2): 8 [IU] via SUBCUTANEOUS
  Administered 2016-04-26: 2 [IU] via SUBCUTANEOUS
  Administered 2016-04-26: 4 [IU] via SUBCUTANEOUS

## 2016-04-25 MED ORDER — SODIUM CHLORIDE 0.9 % IV SOLN: 250.0000 mL | INTRAVENOUS | Status: DC | PRN

## 2016-04-25 MED ORDER — FUROSEMIDE 10 MG/ML IJ SOLN
40.0000 mg | Freq: Once | INTRAMUSCULAR | Status: AC
  Administered 2016-04-25: 40 mg via INTRAVENOUS
  Filled 2016-04-25: qty 4

## 2016-04-25 MED ORDER — POTASSIUM CHLORIDE CRYS ER 20 MEQ PO TBCR
20.0000 meq | EXTENDED_RELEASE_TABLET | Freq: Two times a day (BID) | ORAL | Status: DC
  Administered 2016-04-25 – 2016-04-27 (×4): 20 meq via ORAL
  Filled 2016-04-25 (×4): qty 1

## 2016-04-25 MED ORDER — POTASSIUM CHLORIDE CRYS ER 20 MEQ PO TBCR
40.0000 meq | EXTENDED_RELEASE_TABLET | Freq: Once | ORAL | Status: AC
  Administered 2016-04-25: 40 meq via ORAL
  Filled 2016-04-25: qty 2

## 2016-04-25 MED ORDER — ONDANSETRON HCL 4 MG PO TABS
4.0000 mg | ORAL_TABLET | Freq: Four times a day (QID) | ORAL | Status: DC | PRN
Start: 2016-04-25 — End: 2016-04-27

## 2016-04-25 MED ORDER — ONDANSETRON HCL 4 MG/2ML IJ SOLN
4.0000 mg | Freq: Four times a day (QID) | INTRAMUSCULAR | Status: DC | PRN
Start: 2016-04-25 — End: 2016-04-27
  Administered 2016-04-26: 4 mg via INTRAVENOUS
  Filled 2016-04-25: qty 2

## 2016-04-25 MED ORDER — SODIUM CHLORIDE 0.9% FLUSH: 3.0000 mL | INTRAVENOUS | Status: DC | PRN

## 2016-04-25 MED ORDER — BISACODYL 10 MG RE SUPP
10.0000 mg | Freq: Every day | RECTAL | Status: DC | PRN
Start: 2016-04-25 — End: 2016-04-27

## 2016-04-25 MED ORDER — OXYCODONE HCL 5 MG PO TABS
5.0000 mg | ORAL_TABLET | ORAL | Status: DC | PRN
  Administered 2016-04-25: 5 mg via ORAL
  Administered 2016-04-26 (×3): 10 mg via ORAL
  Administered 2016-04-26: 5 mg via ORAL
  Administered 2016-04-27: 10 mg via ORAL
  Filled 2016-04-25 (×3): qty 2
  Filled 2016-04-25: qty 1
  Filled 2016-04-25 (×2): qty 2
  Filled 2016-04-25: qty 1

## 2016-04-25 MED ORDER — PANTOPRAZOLE SODIUM 40 MG PO TBEC
40.0000 mg | DELAYED_RELEASE_TABLET | Freq: Every day | ORAL | Status: DC
  Administered 2016-04-26 – 2016-04-27 (×2): 40 mg via ORAL
  Filled 2016-04-25 (×2): qty 1

## 2016-04-25 MED ORDER — METOPROLOL TARTRATE 12.5 MG HALF TABLET
12.5000 mg | ORAL_TABLET | Freq: Two times a day (BID) | ORAL | Status: DC
  Administered 2016-04-25: 12.5 mg via ORAL
  Filled 2016-04-25 (×2): qty 1

## 2016-04-25 MED ORDER — ACETAMINOPHEN 325 MG PO TABS: 650.0000 mg | ORAL_TABLET | Freq: Four times a day (QID) | ORAL | Status: DC | PRN

## 2016-04-25 MED ORDER — TRAMADOL HCL 50 MG PO TABS
50.0000 mg | ORAL_TABLET | ORAL | Status: DC | PRN
  Administered 2016-04-25: 100 mg via ORAL
  Filled 2016-04-25: qty 2

## 2016-04-25 MED ORDER — MOVING RIGHT ALONG BOOK
Freq: Once | Status: AC
  Administered 2016-04-25: 16:00:00
  Filled 2016-04-25: qty 1

## 2016-04-25 MED ORDER — SODIUM CHLORIDE 0.9% FLUSH
3.0000 mL | Freq: Two times a day (BID) | INTRAVENOUS | Status: DC
  Administered 2016-04-25 – 2016-04-26 (×3): 3 mL via INTRAVENOUS

## 2016-04-25 MED ORDER — METFORMIN HCL 500 MG PO TABS
1000.0000 mg | ORAL_TABLET | Freq: Two times a day (BID) | ORAL | Status: DC
Start: 2016-04-25 — End: 2016-04-27
  Administered 2016-04-25 – 2016-04-27 (×4): 1000 mg via ORAL
  Filled 2016-04-25 (×4): qty 2

## 2016-04-25 MED ORDER — BISACODYL 5 MG PO TBEC: 10.0000 mg | DELAYED_RELEASE_TABLET | Freq: Every day | ORAL | Status: DC | PRN

## 2016-04-25 MED ORDER — DOCUSATE SODIUM 100 MG PO CAPS
200.0000 mg | ORAL_CAPSULE | Freq: Every day | ORAL | Status: DC
Start: 2016-04-25 — End: 2016-04-27
  Administered 2016-04-26 – 2016-04-27 (×2): 200 mg via ORAL
  Filled 2016-04-25 (×2): qty 2

## 2016-04-25 MED ORDER — ASPIRIN EC 325 MG PO TBEC
325.0000 mg | DELAYED_RELEASE_TABLET | Freq: Every day | ORAL | Status: DC
  Administered 2016-04-26 – 2016-04-27 (×2): 325 mg via ORAL
  Filled 2016-04-25 (×2): qty 1

## 2016-04-25 NOTE — Progress Notes (Signed)
Pt transferred from 2Heart to 2w29. Tele monitor applied. VSS. Call bell and phone within reach. Wife at bedside. Will continue to monitor.

## 2016-04-25 NOTE — Progress Notes (Signed)
2 Days Post-Op Procedure(s) (LRB): CORONARY ARTERY BYPASS GRAFTING (CABG) x 4 (N/A) TRANSESOPHAGEAL ECHOCARDIOGRAM (TEE) (N/A) ENDOVEIN HARVEST OF GREATER SAPHENOUS VEIN (Right) Subjective:  No complaints. Ambulated 3 laps this am  Objective: Vital signs in last 24 hours: Temp:  [97.6 F (36.4 C)-99.4 F (37.4 C)] 98.8 F (37.1 C) (04/25 0300) Pulse Rate:  [85-95] 95 (04/25 0700) Cardiac Rhythm: Normal sinus rhythm (04/25 0727) Resp:  [0-28] 0 (04/25 0700) BP: (82-125)/(59-83) 109/71 (04/25 0700) SpO2:  [87 %-98 %] 96 % (04/25 0700) Weight:  [113.7 kg (250 lb 10.6 oz)] 113.7 kg (250 lb 10.6 oz) (04/25 0500)  Hemodynamic parameters for last 24 hours:    Intake/Output from previous day: 04/24 0701 - 04/25 0700 In: 1648.1 [P.O.:870; I.V.:728.1; IV Piggyback:50] Out: 1157 [Urine:1420] Intake/Output this shift: No intake/output data recorded.  General appearance: alert and cooperative Neurologic: intact Heart: regular rate and rhythm, S1, S2 normal, no murmur, click, rub or gallop Lungs: clear to auscultation bilaterally Extremities: edema mild Wound: dressings dry  Lab Results:  Recent Labs  04/24/16 1627 04/24/16 1643 04/25/16 0408  WBC 18.9*  --  17.7*  HGB 11.5* 10.5* 11.0*  HCT 33.6* 31.0* 32.2*  PLT 183  --  158   BMET:  Recent Labs  04/24/16 0350  04/24/16 1643 04/25/16 0408  NA 136  --  137 133*  K 3.8  --  4.1 4.0  CL 105  --  99* 101  CO2 25  --   --  26  GLUCOSE 112*  --  160* 155*  BUN 13  --  18 15  CREATININE 0.69  < > 0.80 0.71  CALCIUM 8.0*  --   --  8.0*  < > = values in this interval not displayed.  PT/INR:  Recent Labs  04/23/16 1338  LABPROT 15.8*  INR 1.26   ABG    Component Value Date/Time   PHART 7.369 04/23/2016 1737   HCO3 25.7 04/23/2016 1737   TCO2 26 04/24/2016 1643   ACIDBASEDEF 0.8 04/19/2016 1109   O2SAT 99.0 04/23/2016 1737   CBG (last 3)   Recent Labs  04/24/16 2029 04/25/16 0021 04/25/16 0357  GLUCAP  163* 165* 146*   CXR: mild left base atelectasis  Assessment/Plan: S/P Procedure(s) (LRB): CORONARY ARTERY BYPASS GRAFTING (CABG) x 4 (N/A) TRANSESOPHAGEAL ECHOCARDIOGRAM (TEE) (N/A) ENDOVEIN HARVEST OF GREATER SAPHENOUS VEIN (Right)  He is hemodynamically stable in sinus rhythm. Resume Lopressor today.  Mild volume excess: wt up 10 lbs from preop. Start diuresis and KCL.  DM: glucose under reasonable control. Will resume metformin and continue Levemir and SSI. Resume Glucotrol tomorrow if eating ok.  Transfer to 2W and continue IS, ambulation.   LOS: 2 days    Juan Ray 04/25/2016

## 2016-04-25 NOTE — Progress Notes (Signed)
Patient ambulated to new room 2W29, patient tolerated walk well, patient placed on on tele, pt in bed, call bell in reach, receiving RN at bedside, belongings at bedside, wife at bedside, no questions from patient or receiving RN.  Rowe Pavy, RN

## 2016-04-25 NOTE — Progress Notes (Signed)
DC right introducer per MD order and protocol, MSI dressing removed-painted with betadine and covered with gauze, right leg incision dressing removed and painted with betadine, call bell in reach, wife at bedside, will continue to monitor.  Rowe Pavy, RN

## 2016-04-25 NOTE — Progress Notes (Signed)
Patient ambulated in hallway 750ft standby assist, no assistive device used, patient tolerated walk well, patient up to chair, call bell in reach, wife at bedside, will continue to monitor.  Rowe Pavy, RN

## 2016-04-25 NOTE — Progress Notes (Signed)
Patient ambulated in hallway with standby assist 740 ft, patient sitting in chair, wife at bedside, call bell in reach. Will continue to monitor.  Rowe Pavy, RN

## 2016-04-26 LAB — GLUCOSE, CAPILLARY
GLUCOSE-CAPILLARY: 122 mg/dL — AB (ref 65–99)
GLUCOSE-CAPILLARY: 144 mg/dL — AB (ref 65–99)
GLUCOSE-CAPILLARY: 164 mg/dL — AB (ref 65–99)
GLUCOSE-CAPILLARY: 211 mg/dL — AB (ref 65–99)

## 2016-04-26 LAB — BASIC METABOLIC PANEL
ANION GAP: 7 (ref 5–15)
BUN: 16 mg/dL (ref 6–20)
CHLORIDE: 99 mmol/L — AB (ref 101–111)
CO2: 29 mmol/L (ref 22–32)
Calcium: 8.4 mg/dL — ABNORMAL LOW (ref 8.9–10.3)
Creatinine, Ser: 0.84 mg/dL (ref 0.61–1.24)
GFR calc Af Amer: >60 mL/min (ref >60)
GFR calc non Af Amer: >60 mL/min (ref >60)
Glucose, Bld: 154 mg/dL — ABNORMAL HIGH (ref 65–99)
POTASSIUM: 4.1 mmol/L (ref 3.5–5.1)
Sodium: 135 mmol/L (ref 135–145)

## 2016-04-26 LAB — CBC
HEMATOCRIT: 32.5 % — AB (ref 39.0–52.0)
HEMOGLOBIN: 11.2 g/dL — AB (ref 13.0–17.0)
MCH: 31.5 pg (ref 26.0–34.0)
MCHC: 34.5 g/dL (ref 30.0–36.0)
MCV: 91.5 fL (ref 78.0–100.0)
Platelets: 199 10*3/uL (ref 150–400)
RBC: 3.55 MIL/uL — AB (ref 4.22–5.81)
RDW: 13 % (ref 11.5–15.5)
WBC: 14.5 10*3/uL — AB (ref 4.0–10.5)

## 2016-04-26 MED ORDER — METOPROLOL TARTRATE 25 MG PO TABS
25.0000 mg | ORAL_TABLET | Freq: Two times a day (BID) | ORAL | Status: DC
  Administered 2016-04-26 – 2016-04-27 (×3): 25 mg via ORAL
  Filled 2016-04-26 (×3): qty 1

## 2016-04-26 MED ORDER — LACTULOSE 10 GM/15ML PO SOLN
10.0000 g | Freq: Once | ORAL | Status: AC
  Administered 2016-04-26: 10 g via ORAL
  Filled 2016-04-26: qty 15

## 2016-04-26 MED ORDER — METOPROLOL TARTRATE 25 MG PO TABS: 25.0000 mg | ORAL_TABLET | Freq: Two times a day (BID) | ORAL | Status: DC

## 2016-04-26 MED ORDER — LISINOPRIL 5 MG PO TABS
5.0000 mg | ORAL_TABLET | Freq: Every day | ORAL | Status: DC
  Administered 2016-04-26 – 2016-04-27 (×2): 5 mg via ORAL
  Filled 2016-04-26 (×2): qty 1

## 2016-04-26 MED ORDER — LACTULOSE 10 GM/15ML PO SOLN: 20.0000 g | Freq: Every day | ORAL | Status: DC | PRN

## 2016-04-26 MED ORDER — FUROSEMIDE 40 MG PO TABS
40.0000 mg | ORAL_TABLET | Freq: Every day | ORAL | Status: DC
  Administered 2016-04-26 – 2016-04-27 (×2): 40 mg via ORAL
  Filled 2016-04-26 (×2): qty 1

## 2016-04-26 MED ORDER — GLIPIZIDE 10 MG PO TABS
10.0000 mg | ORAL_TABLET | Freq: Two times a day (BID) | ORAL | Status: DC
  Administered 2016-04-26 – 2016-04-27 (×2): 10 mg via ORAL
  Filled 2016-04-26 (×2): qty 1

## 2016-04-26 MED FILL — Dexmedetomidine HCl in NaCl 0.9% IV Soln 400 MCG/100ML: INTRAVENOUS | Qty: 100 | Status: AC

## 2016-04-26 NOTE — Discharge Instructions (Signed)
Coronary Artery Bypass Grafting, Care After ° °This sheet gives you information about how to care for yourself after your procedure. Your health care provider may also give you more specific instructions. If you have problems or questions, contact your health care provider. °What can I expect after the procedure? °After the procedure, it is common to have: °· Nausea and a lack of appetite. °· Constipation. °· Weakness and fatigue. °· Depression or irritability. °· Pain or discomfort in your incision areas. °Follow these instructions at home: °Medicines  °· Take over-the-counter and prescription medicines only as told by your health care provider. Do not stop taking medicines or start any new medicines without approval from your health care provider. °· If you were prescribed an antibiotic medicine, take it as told by your health care provider. Do not stop taking the antibiotic even if you start to feel better. °· Do not drive or use heavy machinery while taking prescription pain medicine. °Incision care  °· Follow instructions from your health care provider about how to take care of your incisions. Make sure you: °¨ Wash your hands with soap and water before you change your bandage (dressing). If soap and water are not available, use hand sanitizer. °¨ Change your dressing as told by your health care provider. °¨ Leave stitches (sutures), skin glue, or adhesive strips in place. These skin closures may need to stay in place for 2 weeks or longer. If adhesive strip edges start to loosen and curl up, you may trim the loose edges. Do not remove adhesive strips completely unless your health care provider tells you to do that. °· Keep incision areas clean, dry, and protected. °· Check your incision areas every day for signs of infection. Check for: °¨ More redness, swelling, or pain. °¨ More fluid or blood. °¨ Warmth. °¨ Pus or a bad smell. °· If incisions were made in your legs: °¨ Avoid crossing your legs. °¨ Avoid  sitting for long periods of time. Change positions every 30 minutes. °¨ Raise (elevate) your legs when you are sitting. °Bathing  °· Do not take baths, swim, or use a hot tub until your health care provider approves. °· Only take sponge baths. Pat the incisions dry. Do not rub incisions with a washcloth or towel. °· Ask your health care provider when you can shower. °Eating and drinking  °· Eat foods that are high in fiber, such as raw fruits and vegetables, whole grains, beans, and nuts. Meats should be lean cut. Avoid canned, processed, and fried foods. This can help prevent constipation and is a recommended part of a heart-healthy diet. °· Drink enough fluid to keep your urine clear or pale yellow. °· Limit alcohol intake to no more than 1 drink a day for nonpregnant women and 2 drinks a day for men. One drink equals 12 oz of beer, 5 oz of wine, or 1½ oz of hard liquor. °Activity  °· Rest and limit your activity as told by your health care provider. You may be instructed to: °¨ Stop any activity right away if you have chest pain, shortness of breath, irregular heartbeats, or dizziness. Get help right away if you have any of these symptoms. °¨ Move around frequently for short periods or take short walks as directed by your health care provider. Gradually increase your activities. You may need physical therapy or cardiac rehabilitation to help strengthen your muscles and build your endurance. °¨ Avoid lifting, pushing, or pulling anything that is heavier than 10   4.5 kg) for at least 6 weeks or as told by your health care provider.  Do not drive until your health care provider approves.  Ask your health care provider when you may return to work.  Ask your health care provider when you may resume sexual activity. General instructions   Do not use any products that contain nicotine or tobacco, such as cigarettes and e-cigarettes. If you need help quitting, ask your health care provider.  Take 2-3 deep  breaths every few hours during the day, while you recover. This helps expand your lungs and prevent complications like pneumonia after surgery.  If you were given a device called an incentive spirometer, use it several times a day to practice deep breathing. Support your chest with a pillow or your arms when you take deep breaths or cough.  Wear compression stockings as told by your health care provider. These stockings help to prevent blood clots and reduce swelling in your legs.  Weigh yourself every day. This helps identify if your body is holding (retaining) fluid that may make your heart and lungs work harder.  Keep all follow-up visits as told by your health care provider. This is important. Contact a health care provider if:  You have more redness, swelling, or pain around any incision.  You have more fluid or blood coming from any incision.  Any incision feels warm to the touch.  You have pus or a bad smell coming from any incision  You have a fever.  You have swelling in your ankles or legs.  You have pain in your legs.  You gain 2 lb (0.9 kg) or more a day.  You are nauseous or you vomit.  You have diarrhea. Get help right away if:  You have chest pain that spreads to your jaw or arms.  You are short of breath.  You have a fast or irregular heartbeat.  You notice a "clicking" in your breastbone (sternum) when you move.  You have numbness or weakness in your arms or legs.  You feel dizzy or light-headed. Summary  After the procedure, it is common to have pain or discomfort in the incision areas.  Do not take baths, swim, or use a hot tub until your health care provider approves.  Gradually increase your activities. You may need physical therapy or cardiac rehabilitation to help strengthen your muscles and build your endurance.  Weigh yourself every day. This helps identify if your body is holding (retaining) fluid that may make your heart and lungs work  harder. This information is not intended to replace advice given to you by your health care provider. Make sure you discuss any questions you have with your health care provider. Document Released: 07/07/2004 Document Revised: 11/07/2015 Document Reviewed: 11/07/2015 Elsevier Interactive Patient Education  2017 [employer].   Endoscopic Saphenous Vein Harvesting, Care After Refer to this sheet in the next few weeks. These instructions provide you with information about caring for yourself after your procedure. Your health care provider may also give you more specific instructions. Your treatment has been planned according to current medical practices, but problems sometimes occur. Call your health care provider if you have any problems or questions after your procedure. What can I expect after the procedure? After the procedure, it is common to have:  Pain.  Bruising.  Swelling.  Numbness. Follow these instructions at home: Medicine   Take over-the-counter and prescription medicines only as told by your health care provider.  Do not  drive or operate heavy machinery while taking prescription pain medicine. Incision care    Follow instructions from your health care provider about how to take care of the cut made during surgery (incision). Make sure you:  Wash your hands with soap and water before you change your bandage (dressing). If soap and water are not available, use hand sanitizer.  Change your dressing as told by your health care provider.  Leave stitches (sutures), skin glue, or adhesive strips in place. These skin closures may need to be in place for 2 weeks or longer. If adhesive strip edges start to loosen and curl up, you may trim the loose edges. Do not remove adhesive strips completely unless your health care provider tells you to do that.  Check your incision area every day for signs of infection. Check for:  More redness, swelling, or pain.  More fluid or  blood.  Warmth.  Pus or a bad smell. General instructions   Raise (elevate) your legs above the level of your heart while you are sitting or lying down.  Do any exercises your health care providers have given you. These may include deep breathing, coughing, and walking exercises.  Do not shower, take baths, swim, or use a hot tub unless told by your health care provider.  Wear your elastic stocking if told by your health care provider.  Keep all follow-up visits as told by your health care provider. This is important. Contact a health care provider if:  Medicine does not help your pain.  Your pain gets worse.  You have new leg bruises or your leg bruises get bigger.  You have a fever.  Your leg feels numb.  You have more redness, swelling, or pain around your incision.  You have more fluid or blood coming from your incision.  Your incision feels warm to the touch.  You have pus or a bad smell coming from your incision. Get help right away if:  Your pain is severe.  You develop pain, tenderness, warmth, redness, or swelling in any part of your leg.  You have chest pain.  You have trouble breathing. This information is not intended to replace advice given to you by your health care provider. Make sure you discuss any questions you have with your health care provider. Document Released: 08/30/2010 Document Revised: 05/26/2015 Document Reviewed: 11/01/2014 Elsevier Interactive Patient Education  2017 [employer].

## 2016-04-26 NOTE — Progress Notes (Signed)
CARDIAC REHAB PHASE I    MODE:  Ambulation: 850 ft   POST:  Rate/Rhythm: 115 ST  BP:  Sitting: 148/77         SaO2: 96 RA  Second attempt to ambulate with pt, pt has been walking independently, pt now up in hallway, agreeable to walk with cardiac rehab (unable to obtain pre-walk vitals). Pt ambulated 850 ft on RA, independent, steady gait, tolerated well with no complaints other than some intermittent leg cramps. Pt to edge of bed after walk, call bell within reach. Will follow.     [phone] Lenna Sciara, RN, BSN 04/26/2016 11:20 AM

## 2016-04-26 NOTE — Progress Notes (Signed)
Pt ambulated in the hallway with no issues.

## 2016-04-26 NOTE — Discharge Summary (Signed)
Physician Discharge Summary  Patient ID: Juan Ray MRN: [id number] DOB/AGE: 1960/11/27 56 y.o.  Admit date: 04/23/2016 Discharge date: 04/27/2016  Admission Diagnoses:  Patient Active Problem List   Diagnosis Date Noted  . Abnormal stress test 04/13/2016  . Chest pain 03/11/2016  . Ischemic cardiomyopathy 07/02/2013  . Midsternal chest pain 07/11/2012  . ST elevation myocardial infarction (STEMI) of inferoposterior wall ([place]) 04/09/2012  . CAD (coronary artery disease), native coronary artery 04/09/2012  . PUD (peptic ulcer disease) 04/09/2012  . Epigastric pain 06/18/2011  . HTN (hypertension) 06/18/2011  . DM (diabetes mellitus) ([place]) 06/18/2011  . Hyperlipidemia 06/18/2011   Discharge Diagnoses:   Patient Active Problem List   Diagnosis Date Noted  . S/P CABG x 4 04/23/2016  . Abnormal stress test 04/13/2016  . Chest pain 03/11/2016  . Ischemic cardiomyopathy 07/02/2013  . Midsternal chest pain 07/11/2012  . ST elevation myocardial infarction (STEMI) of inferoposterior wall ([place]) 04/09/2012  . CAD (coronary artery disease), native coronary artery 04/09/2012  . PUD (peptic ulcer disease) 04/09/2012  . Epigastric pain 06/18/2011  . HTN (hypertension) 06/18/2011  . DM (diabetes mellitus) ([place]) 06/18/2011  . Hyperlipidemia 06/18/2011   Discharged Condition: good  History of Present Illness:  Juan Ray is a 56 yo white male with poorly controlled DM with neuropathy, HTN, hyperlipidemia, and known CAD.  This originally developed in 2014 with the patient suffered an inferior/posterior STEMI with subsequent stent placement to the Left Circumflex artery.  He did not follow up after his stent placement, and he due to loss of insurance he stopped taking his medications for a period of time.  He presented in March of this year with complaints of chest pain that developed while the patient was driving.  He was ruled in for MI at that time.  Outpatient stress test was  performed and showed a reduced EF of <30%.  Cardiac catheterization was performed on 04/13/2016 which showed severe 3 V CAD. It was felt coronary bypass grafting would be indicated and the patient was referred to [facility] for consultation.  He was evaluated by Dr. Cyndia Bent at which time the patient admitted to continued intermittent deep sternal pain, that he described as a knife going through his chest.  This occurs with both exertion and rest.  Dr. Cyndia Bent was in agreement the coronary bypass grafting would be his best treatment option.  The risks and benefits of the procedure were explained to the patient and he was agreeable to proceed.    Hospital Course:   Juan Ray presented to [facility] on 04[facility]23[facility]2016.  He was taken to the operating room and underwent CABG x 4 utilizing LIMA to LAD, SVG to Distal RCA, and a Sequential SVG to OM1 and OM2.  He also underwent endoscopic harvest of greater saphenous vein from right leg.  He tolerated the procedure without difficulty and was taken to the SICU in stable condition.  He was extubated the evening of surgery.  During his stay in the SICU the patient was weaned off Neo Synephrine as tolerated.  His chest tubes and arterial lines were removed on POD #1.  The patient was ambulating independently in the SICU.  He was started on diureses for mild volume excess on POD #2.  He was maintaining NSR and felt to be medically stable for transfer to telemetry unit in stable condition.  The patient continues to progress.  He continues to maintain NSR.  His pacing wires were removed without difficulty.  He was  mildly hypertensive and was restarted on his home Lisinopril at a reduced dose.  His sugars have been well controlled.  He is tolerating a diabetic diet and has been restarted on his home regimen of Metformin.  He remains medically stable.  He continues to ambulate independently and is ready for discharge home today.          Significant Diagnostic Studies:  angiography:  1. Significant 3-vessel coronary artery disease, including sequential 50-70% ostial, proximal, and mid LAD stenoses, which are hemodynamically significant (resting Pd/Pa 0.72), 50% in-stent restenosis in mid LCx as well as 80% 90% stenoses involving OM2 and distal LCx, and chronic total occlusion of mid RCA with distal vessel filling via bridging and left-to-right collaterals. 2. Upper normal left ventricular filling pressure. 3. Moderately reduced left ventricular contraction with inferior akinesis (LVEF 35-40%).  Treatments: surgery:   1. Median Sternotomy 2. Extracorporeal circulation 3.   Coronary artery bypass grafting x 4   Left internal mammary graft to the LAD  Sequential SVG to OM1 and OM2  SVG to distal RCA  4.   Endoscopic vein harvest from the right leg  Disposition: 01-Home or Self Care   Discharge Medications:  The patient has been discharged on:   1.Beta Blocker:  Yes [ x  ]                              No   [   ]                              If No, reason:  2.Ace Inhibitor/ARB: Yes [ x  ]                                     No  [    ]                                     If No, reason:  3.Statin:   Yes [ x  ]                  No  [   ]                  If No, reason:  4.Ecasa:  Yes  [ x  ]                  No   [   ]                  If No, reason:      Allergies as of 04/27/2016      Reactions   No Known Allergies       Medication List    STOP taking these medications   isosorbide mononitrate 30 MG 24 hr tablet Commonly known as:  IMDUR     TAKE these medications   acetaminophen 325 MG tablet Commonly known as:  TYLENOL Take 2 tablets (650 mg total) by mouth every 6 (six) hours as needed for mild pain.   aspirin 81 MG EC tablet Take 1 tablet (81 mg total) by mouth daily.   atorvastatin 40 MG tablet Commonly known as:  LIPITOR Take 1 tablet (40  mg total) by mouth daily. What changed:  when to take this   docusate  sodium 100 MG capsule Commonly known as:  COLACE Take 200 mg by mouth at bedtime.   escitalopram 10 MG tablet Commonly known as:  LEXAPRO Take 10 mg by mouth at bedtime.   furosemide 40 MG tablet Commonly known as:  LASIX Take 1 tablet (40 mg total) by mouth daily. For 5 days   gabapentin 300 MG capsule Commonly known as:  NEURONTIN Take 300 mg by mouth 3 (three) times daily.   glipiZIDE 10 MG tablet Commonly known as:  GLUCOTROL TAKE ONE TABLET BY MOUTH TWICE DAILY BEFORE A MEAL   lisinopril 5 MG tablet Commonly known as:  PRINIVIL,ZESTRIL Take 1 tablet (5 mg total) by mouth daily. What changed:  medication strength  how much to take   metFORMIN 1000 MG tablet Commonly known as:  GLUCOPHAGE Take 1 tablet (1,000 mg total) by mouth 2 (two) times daily.   metoprolol tartrate 25 MG tablet Commonly known as:  LOPRESSOR Take 1 tablet (25 mg total) by mouth 2 (two) times daily.   nitroGLYCERIN 0.4 MG SL tablet Commonly known as:  NITROSTAT Place 1 tablet (0.4 mg total) under the tongue every 5 (five) minutes as needed for chest pain. X 3 doses   oxyCODONE 5 MG immediate release tablet Commonly known as:  Oxy IR/ROXICODONE Take 1-2 tablets (5-10 mg total) by mouth every 3 (three) hours as needed for severe pain.   pantoprazole 40 MG tablet Commonly known as:  PROTONIX Take 1 tablet (40 mg total) by mouth daily.   potassium chloride SA 20 MEQ tablet Commonly known as:  K-DUR,KLOR-CON Take 1 tablet (20 mEq total) by mouth daily. For 5 days      Follow-up Information    Gaye Pollack, MD Follow up on 05/30/2016.   Specialty:  Cardiothoracic Surgery Why:  Appointment is at 9:30, please get CXR at 9:00 at [facility] located on first floor of our office building Contact information: [place] [address] [place], [place] Follow up.   Specialty:  Family Medicine Why:  Keep appointment on 5/1, may have  sutures remove at that visit. Contact information: [place] [address] [phone]        Bhagat,Bhavinkumar, [place] Follow up on 05/14/2016.   Specialty:  Cardiology Why:  Appointment is at 1:30 Contact information: [place] [address] [phone]           Signed: Ellwood Handler 04/27/2016, 8:15 AM

## 2016-04-26 NOTE — Progress Notes (Addendum)
      [address]       [place] [address]             [phone]      3 Days Post-Op Procedure(s) (LRB): CORONARY ARTERY BYPASS GRAFTING (CABG) x 4 (N/A) TRANSESOPHAGEAL ECHOCARDIOGRAM (TEE) (N/A) ENDOVEIN HARVEST OF GREATER SAPHENOUS VEIN (Right)   Subjective:  Mr. Noren is doing okay.  He states he needs to move his bowels.  He denies chest pain, shortness of breath, and N/V.  + Flatus  Objective: Vital signs in last 24 hours: Temp:  [98.6 F (37 C)-100.3 F (37.9 C)] 98.8 F (37.1 C) (04/26 0438) Pulse Rate:  [90-105] 96 (04/26 0438) Cardiac Rhythm: Normal sinus rhythm (04/26 0700) Resp:  [0-18] 18 (04/26 0438) BP: (97-143)/(64-83) 143/83 (04/26 0438) SpO2:  [90 %-96 %] 93 % (04/26 0438) Weight:  [249 lb 12.8 oz (113.3 kg)] 249 lb 12.8 oz (113.3 kg) (04/26 0438)  Intake/Output from previous day: 04/25 0701 - 04/26 0700 In: -  Out: 1500 [Urine:1500]  General appearance: alert, cooperative and no distress Heart: regular rate and rhythm Lungs: clear to auscultation bilaterally Abdomen: soft, non-tender; bowel sounds normal; no masses,  no organomegaly Extremities: edema trace,  mild pitting R > L Wound: clean and dry  Lab Results:  Recent Labs  04/25/16 0408 04/26/16 0348  WBC 17.7* 14.5*  HGB 11.0* 11.2*  HCT 32.2* 32.5*  PLT 158 199   BMET:  Recent Labs  04/25/16 0408 04/26/16 0348  NA 133* 135  K 4.0 4.1  CL 101 99*  CO2 26 29  GLUCOSE 155* 154*  BUN 15 16  CREATININE 0.71 0.84  CALCIUM 8.0* 8.4*    PT/INR:  Recent Labs  04/23/16 1338  LABPROT 15.8*  INR 1.26   ABG    Component Value Date/Time   PHART 7.369 04/23/2016 1737   HCO3 25.7 04/23/2016 1737   TCO2 26 04/24/2016 1643   ACIDBASEDEF 0.8 04/19/2016 1109   O2SAT 99.0 04/23/2016 1737   CBG (last 3)   Recent Labs  04/25/16 2122 04/26/16 0022 04/26/16 0638  GLUCAP 172* 164* 144*    Assessment/Plan: S/P Procedure(s) (LRB): CORONARY ARTERY BYPASS  GRAFTING (CABG) x 4 (N/A) TRANSESOPHAGEAL ECHOCARDIOGRAM (TEE) (N/A) ENDOVEIN HARVEST OF GREATER SAPHENOUS VEIN (Right)  1. CV- Sinus Tach, mild HTN- will increase Lopressor to home dose of 25 mg BID, will restart home Lisinopril at 5 mg daily 2. Pulm- no acute issues, continue IS 3. Renal-creatinine WNL, mild hypervolemia, continue Lasix, potassium supplement 4. GI- constipation, will add lactulose prn 5. Dispo- patient stable, add lisinopril for mild HTN, d/c EPW today, possibly ready for d/c home in the next 24-48 hours   LOS: 3 days    Ahmed Prima, Junie Panning 04/26/2016

## 2016-04-27 LAB — GLUCOSE, CAPILLARY: Glucose-Capillary: 137 mg/dL — ABNORMAL HIGH (ref 65–99)

## 2016-04-27 MED ORDER — OXYCODONE HCL 5 MG PO TABS: 5.0000 mg | ORAL_TABLET | ORAL | 0 refills | Status: DC | PRN

## 2016-04-27 MED ORDER — LISINOPRIL 5 MG PO TABS: 5.0000 mg | ORAL_TABLET | Freq: Every day | ORAL | 3 refills | Status: DC

## 2016-04-27 MED ORDER — ACETAMINOPHEN 325 MG PO TABS: 650.0000 mg | ORAL_TABLET | Freq: Four times a day (QID) | ORAL | Status: DC | PRN

## 2016-04-27 MED ORDER — POTASSIUM CHLORIDE CRYS ER 20 MEQ PO TBCR: 20.0000 meq | EXTENDED_RELEASE_TABLET | Freq: Every day | ORAL | Status: DC

## 2016-04-27 MED ORDER — MOVING RIGHT ALONG BOOK
Freq: Once | Status: DC
  Filled 2016-04-27 (×2): qty 1

## 2016-04-27 MED ORDER — FUROSEMIDE 40 MG PO TABS: 40.0000 mg | ORAL_TABLET | Freq: Every day | ORAL | 0 refills | Status: DC

## 2016-04-27 NOTE — Progress Notes (Signed)
Pt DC per MD order. Iv removed, telemetry d/c, CCMD notified. AVS went over with patient and spouse including follow ups and medications. Prescriptions signed and given to patient. Shower and incision care explained with teach back. No further questions at this time. Patient to home with wife.  Cyndia Bent RN

## 2016-04-27 NOTE — Care Management Note (Signed)
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 Anticipate return home- CM to follow for pt progression and d/c needs   Expected Discharge Date:  04/27/16               Expected Discharge Plan:  Home/Self Care  In-House Referral:     Discharge planning Services  CM Consult  Post Acute Care Choice:  NA Choice offered to:  NA  DME Arranged:    DME Agency:     HH Arranged:    [place] Agency:     Status of Service:  Completed, signed off  If discussed at [employer] of Stay Meetings, dates discussed:    Discharge Disposition: home/self care   Additional Comments:  04/27/16- 0945- Marvetta Gibbons RN, CM- pt for d/c home today- no dc needs noted.   Dawayne Patricia, RN 04/27/2016, 9:48 AM

## 2016-04-27 NOTE — Progress Notes (Addendum)
      [address]       [place] [address]             [phone]      4 Days Post-Op Procedure(s) (LRB): CORONARY ARTERY BYPASS GRAFTING (CABG) x 4 (N/A) TRANSESOPHAGEAL ECHOCARDIOGRAM (TEE) (N/A) ENDOVEIN HARVEST OF GREATER SAPHENOUS VEIN (Right)   Subjective:  No complaints.  Was able to move bowels without difficulty.  Wants to go home.  Objective: Vital signs in last 24 hours: Temp:  [99.1 F (37.3 C)] 99.1 F (37.3 C) (04/27 0510) Pulse Rate:  [88-97] 88 (04/27 0510) Cardiac Rhythm: Normal sinus rhythm (04/27 0700) Resp:  [18] 18 (04/27 0510) BP: (125-139)/(73-76) 125/76 (04/27 0510) SpO2:  [92 %] 92 % (04/27 0510) Weight:  [246 lb 8 oz (111.8 kg)] 246 lb 8 oz (111.8 kg) (04/27 0510)  Intake/Output from previous day: 04/26 0701 - 04/27 0700 In: 960 [P.O.:960] Out: 850 [Urine:850]  General appearance: alert, cooperative and no distress Heart: regular rate and rhythm Lungs: clear to auscultation bilaterally Abdomen: soft, non-tender; bowel sounds normal; no masses,  no organomegaly Extremities: edema trace Wound: clean and dry  Lab Results:  Recent Labs  04/25/16 0408 04/26/16 0348  WBC 17.7* 14.5*  HGB 11.0* 11.2*  HCT 32.2* 32.5*  PLT 158 199   BMET:  Recent Labs  04/25/16 0408 04/26/16 0348  NA 133* 135  K 4.0 4.1  CL 101 99*  CO2 26 29  GLUCOSE 155* 154*  BUN 15 16  CREATININE 0.71 0.84  CALCIUM 8.0* 8.4*    PT/INR: No results for input(s): LABPROT, INR in the last 72 hours. ABG    Component Value Date/Time   PHART 7.369 04/23/2016 1737   HCO3 25.7 04/23/2016 1737   TCO2 26 04/24/2016 1643   ACIDBASEDEF 0.8 04/19/2016 1109   O2SAT 99.0 04/23/2016 1737   CBG (last 3)   Recent Labs  04/26/16 1829 04/26/16 2108 04/27/16 0609  GLUCAP 211* 122* 137*    Assessment/Plan: S/P Procedure(s) (LRB): CORONARY ARTERY BYPASS GRAFTING (CABG) x 4 (N/A) TRANSESOPHAGEAL ECHOCARDIOGRAM (TEE) (N/A) ENDOVEIN HARVEST OF  GREATER SAPHENOUS VEIN (Right)  1. CV- NSR, HR and BP improved- continue Lopressor, Lisinopril 2. Pulm- no acute issues, continue IS 3. Renal- weight is trending down, continue lasix for a few more days, 4. GI-constipation resolved 5. Dispo- patient stable, will d/c home today   LOS: 4 days    Ahmed Prima, ERIN 04/27/2016   Chart reviewed, patient examined, agree with above. He has had a very smooth course with no arrhythmias. He is ambulating well. Plan home today.

## 2016-04-27 NOTE — Progress Notes (Signed)
CARDIAC REHAB PHASE I   Pt currently on bed rest, awaiting discharge. Cardiac surgery discharge education completed with pt and wife at bedside. Reviewed IS, sternal precautions, activity progression, exercise,  heart healthy diet, carb counting, sodium restrictions, daily weights, CHF booklet, and phase 2 cardiac rehab. Pt verbalized understanding. Pt agrees to phase 2 cardiac rehab referral, will send to [facility] per pt request. Pt in bed, call bell within reach.   [phone] Lenna Sciara, RN, BSN 04/27/2016 10:20 AM

## 2016-04-27 NOTE — Progress Notes (Signed)
Removed pacing wires per Md order. 2 sutures removed, wires pulled, intact. Cleansed and steri strips placed. Bedrest for one hour. Vitals WNL. No complaints at this time. Will continue to monitor.  Cyndia Bent RN

## 2016-05-01 ENCOUNTER — Ambulatory Visit (INDEPENDENT_AMBULATORY_CARE_PROVIDER_SITE_OTHER): Payer: BLUE CROSS/BLUE SHIELD | Admitting: Family Medicine

## 2016-05-01 ENCOUNTER — Encounter: Payer: Self-pay | Admitting: Family Medicine

## 2016-05-01 VITALS — BP 98/60 | HR 86 | Temp 98.6°F | Resp 18 | Ht 69.0 in | Wt 231.0 lb

## 2016-05-01 DIAGNOSIS — Z0000 Encounter for general adult medical examination without abnormal findings: Secondary | ICD-10-CM

## 2016-05-01 DIAGNOSIS — Z09 Encounter for follow-up examination after completed treatment for conditions other than malignant neoplasm: Secondary | ICD-10-CM

## 2016-05-01 DIAGNOSIS — E1100 Type 2 diabetes mellitus with hyperosmolarity without nonketotic hyperglycemic-hyperosmolar coma (NKHHC): Secondary | ICD-10-CM

## 2016-05-01 DIAGNOSIS — I2510 Atherosclerotic heart disease of native coronary artery without angina pectoris: Secondary | ICD-10-CM | POA: Diagnosis not present

## 2016-05-01 DIAGNOSIS — I10 Essential (primary) hypertension: Secondary | ICD-10-CM

## 2016-05-01 LAB — LIPID PANEL
CHOL/HDL RATIO: 4.4 ratio (ref <5.0)
CHOLESTEROL: 96 mg/dL (ref <200)
HDL: 22 mg/dL — AB (ref >40)
LDL Cholesterol: 45 mg/dL (ref <100)
TRIGLYCERIDES: 144 mg/dL (ref <150)
VLDL: 29 mg/dL (ref <30)

## 2016-05-01 LAB — COMPLETE METABOLIC PANEL WITH GFR
ALBUMIN: 3.4 g/dL — AB (ref 3.6–5.1)
ALT: 50 U/L — ABNORMAL HIGH (ref 9–46)
AST: 30 U/L (ref 10–35)
Alkaline Phosphatase: 122 U/L — ABNORMAL HIGH (ref 40–115)
BUN: 16 mg/dL (ref 7–25)
CALCIUM: 9.1 mg/dL (ref 8.6–10.3)
CO2: 25 mmol/L (ref 20–31)
Chloride: 98 mmol/L (ref 98–110)
Creat: 0.98 mg/dL (ref 0.70–1.33)
GFR, EST NON AFRICAN AMERICAN: 85 mL/min (ref >=60)
Glucose, Bld: 244 mg/dL — ABNORMAL HIGH (ref 70–99)
POTASSIUM: 4.9 mmol/L (ref 3.5–5.3)
Sodium: 135 mmol/L (ref 135–146)
Total Bilirubin: 0.6 mg/dL (ref 0.2–1.2)
Total Protein: 6.7 g/dL (ref 6.1–8.1)

## 2016-05-01 LAB — CBC WITH DIFFERENTIAL/PLATELET
BASOS ABS: 151 {cells}/uL (ref 0–200)
Basophils Relative: 1 %
Eosinophils Absolute: 453 cells/uL (ref 15–500)
Eosinophils Relative: 3 %
HEMATOCRIT: 41.8 % (ref 38.5–50.0)
HEMOGLOBIN: 14.2 g/dL (ref 13.0–17.0)
LYMPHS PCT: 20 %
Lymphs Abs: 3020 cells/uL (ref 850–3900)
MCH: 32.6 pg (ref 27.0–33.0)
MCHC: 34 g/dL (ref 32.0–36.0)
MCV: 96.1 fL (ref 80.0–100.0)
MONO ABS: 1812 {cells}/uL — AB (ref 200–950)
MPV: 9.4 fL (ref 7.5–12.5)
Monocytes Relative: 12 %
NEUTROS PCT: 64 %
Neutro Abs: 9664 cells/uL — ABNORMAL HIGH (ref 1500–7800)
Platelets: 489 10*3/uL — ABNORMAL HIGH (ref 140–400)
RBC: 4.35 MIL/uL (ref 4.20–5.80)
RDW: 13.5 % (ref 11.0–15.0)
WBC: 15.1 10*3/uL — AB (ref 3.8–10.8)

## 2016-05-01 MED ORDER — EMPAGLIFLOZIN 25 MG PO TABS: 25.0000 mg | ORAL_TABLET | Freq: Every day | ORAL | 11 refills | Status: DC

## 2016-05-01 NOTE — Progress Notes (Signed)
Subjective:    Patient ID: Juan Ray, male    DOB: [birth year], 58 y.o.   MRN: [id number]  HPI Patient is a very pleasant 56 year old white male here today to discuss his medical problems. Although very nice he is extremely noncompliant and fails to follow-up as scheduled. He is scheduled for complete physical exam. However he was just discharged from the hospital after undergoing CABG x 4 by Dr. Cyndia Bent due to refractory coronary artery disease. Patient has done well since leaving the hospital. He denies any residual chest pain or shortness of breath. He does report fatigue. He also reports extreme dizziness due to low blood pressure. He is just finished 5 days of Lasix because of edema after open heart surgery. This could be contributing some to his dizziness and dehydration. In the hospital, the patient's hemoglobin A1c was 7.8 which is dramatically improved from his hemoglobin A1c that was greater than 10 when I checked it in January. However is still not ideal. He is due for colonoscopy. Obviously this is been placed on the back burner given his recent open heart surgery. His blood pressure today is 98/60. The patient is symptomatic and reports orthostatic dizziness. He denies any visible blood loss, melena.  He is also requesting that we removed 3 sutures from his abdomen. He appears to have what is 3 simple interrupted 2-0 Ethilon sutures in his abdomen below the xiphoid process. The wounds are well-healed. The 3 sutures are removed without difficulty and covered with Steri-Strips Past Medical History:  Diagnosis Date  . Anxiety   . Arthritis   . CAD (coronary artery disease) 04/07/2012   Inferoposterior STEMI s/p DES-mid LCx  . Diabetes mellitus   . Diabetic neuropathy ([place])   . GERD (gastroesophageal reflux disease)   . High cholesterol   . Hypertension   . Myocardial infarction ([place])   . Obesity   . Pancreatitis    a. mild by CT 06/2011  . Peptic ulcer disease    a. 06/2009 EGD:  multiple gastric and duodenal ulcers.   Past Surgical History:  Procedure Laterality Date  . CORONARY ANGIOPLASTY WITH STENT PLACEMENT  04/07/2012   70% prox LAD, 70-80% ostial diagonal, 70% mid-distal LAD, 80% prox OM1, mid LCx totally occluded just distal to OM1 s/p DES, occluded RCA; severe inferior HK, LVEF 45%  . CORONARY ARTERY BYPASS GRAFT N/A 04/23/2016   Procedure: CORONARY ARTERY BYPASS GRAFTING (CABG) x 4;  Surgeon: Gaye Pollack, MD;  Location: [facility];  Service: Open Heart Surgery;  Laterality: N/A;  . ENDOVEIN HARVEST OF GREATER SAPHENOUS VEIN Right 04/23/2016   Procedure: ENDOVEIN HARVEST OF GREATER SAPHENOUS VEIN;  Surgeon: Gaye Pollack, MD;  Location: [facility];  Service: Open Heart Surgery;  Laterality: Right;  . ESOPHAGOGASTRODUODENOSCOPY  06/18/2011   NL ESOPHAGUS/UlcerATED LESIONS/ SUPERFICIAL Ulcers  . INTRAVASCULAR PRESSURE WIRE/FFR STUDY N/A 04/13/2016   Procedure: Intravascular Pressure Wire/FFR Study;  Surgeon: Nelva Bush, MD;  Location: [facility];  Service: Cardiovascular;  Laterality: N/A;  . LEFT HEART CATH AND CORONARY ANGIOGRAPHY N/A 04/13/2016   Procedure: Left Heart Cath and Coronary Angiography;  Surgeon: Nelva Bush, MD;  Location: [place];  Service: Cardiovascular;  Laterality: N/A;  . LEFT HEART CATHETERIZATION WITH CORONARY ANGIOGRAM N/A 04/07/2012   Procedure: LEFT HEART CATHETERIZATION WITH CORONARY ANGIOGRAM;  Surgeon: Sherren Mocha, MD;  Location: [facility] CATH LAB;  Service: Cardiovascular;  Laterality: N/A;  . PERCUTANEOUS CORONARY STENT INTERVENTION (PCI-S)  04/07/2012   Procedure:  PERCUTANEOUS CORONARY STENT INTERVENTION (PCI-S);  Surgeon: Sherren Mocha, MD;  Location: [facility] C[facility];  Service: Cardiovascular;;  . TEE WITHOUT CARDIOVERSION N/A 04/23/2016   Procedure: TRANSESOPHAGEAL ECHOCARDIOGRAM (TEE);  Surgeon: Gaye Pollack, MD;  Location: [place];  Service: Open Heart Surgery;  Laterality: N/A;   Current Outpatient Prescriptions on File  Prior to Visit  Medication Sig Dispense Refill  . acetaminophen (TYLENOL) 325 MG tablet Take 2 tablets (650 mg total) by mouth every 6 (six) hours as needed for mild pain.    Marland Kitchen aspirin EC 81 MG EC tablet Take 1 tablet (81 mg total) by mouth daily.    Marland Kitchen atorvastatin (LIPITOR) 40 MG tablet Take 1 tablet (40 mg total) by mouth daily. (Patient taking differently: Take 40 mg by mouth at bedtime. ) 30 tablet 5  . docusate sodium (COLACE) 100 MG capsule Take 200 mg by mouth at bedtime.    Marland Kitchen escitalopram (LEXAPRO) 10 MG tablet Take 10 mg by mouth at bedtime.   0  . furosemide (LASIX) 40 MG tablet Take 1 tablet (40 mg total) by mouth daily. For 5 days 5 tablet 0  . gabapentin (NEURONTIN) 300 MG capsule Take 300 mg by mouth 3 (three) times daily.   1  . glipiZIDE (GLUCOTROL) 10 MG tablet TAKE ONE TABLET BY MOUTH TWICE DAILY BEFORE A MEAL 60 tablet 3  . lisinopril (PRINIVIL,ZESTRIL) 5 MG tablet Take 1 tablet (5 mg total) by mouth daily. 30 tablet 3  . metFORMIN (GLUCOPHAGE) 1000 MG tablet Take 1 tablet (1,000 mg total) by mouth 2 (two) times daily. 60 tablet 3  . metoprolol tartrate (LOPRESSOR) 25 MG tablet Take 1 tablet (25 mg total) by mouth 2 (two) times daily. 60 tablet 1  . nitroGLYCERIN (NITROSTAT) 0.4 MG SL tablet Place 1 tablet (0.4 mg total) under the tongue every 5 (five) minutes as needed for chest pain. X 3 doses 25 tablet prn  . oxyCODONE (OXY IR/ROXICODONE) 5 MG immediate release tablet Take 1-2 tablets (5-10 mg total) by mouth every 3 (three) hours as needed for severe pain. 30 tablet 0  . pantoprazole (PROTONIX) 40 MG tablet Take 1 tablet (40 mg total) by mouth daily. 30 tablet 11  . potassium chloride SA (K-DUR,KLOR-CON) 20 MEQ tablet Take 1 tablet (20 mEq total) by mouth daily. For 5 days     No current facility-administered medications on file prior to visit.    Allergies  Allergen Reactions  . No Known Allergies    Social History   Social History  . Marital status: Married     Spouse name: N/A  . Number of children: N/A  . Years of education: N/A   Occupational History  . truck driver    Social History Main Topics  . Smoking status: Former Smoker    Years: 20.00    Quit date: 2000  . Smokeless tobacco: Former Systems developer    Quit date: 01/01/1998  . Alcohol use No  . Drug use: No  . Sexual activity: Yes   Other Topics Concern  . Not on file   Social History Narrative   Lives in [place], with his wife and 67 yr old son.      Review of Systems  All other systems reviewed and are negative.      Objective:   Physical Exam  Constitutional: He is oriented to person, place, and time. He appears well-developed and well-nourished. He appears distressed.  HENT:  Head: Normocephalic and atraumatic.  Right Ear:  External ear normal.  Left Ear: External ear normal.  Nose: Nose normal.  Mouth/Throat: Oropharynx is clear and moist. No oropharyngeal exudate.  Eyes: Conjunctivae and EOM are normal. Pupils are equal, round, and reactive to light. Right eye exhibits no discharge. Left eye exhibits no discharge. No scleral icterus.  Neck: Neck supple. No JVD present. No tracheal deviation present. No thyromegaly present.  Cardiovascular: Regular rhythm, normal heart sounds and intact distal pulses.  Tachycardia present.   No murmur heard. Pulmonary/Chest: Effort normal and breath sounds normal. No stridor. No respiratory distress. He has no wheezes. He has no rales. He exhibits tenderness.  Abdominal: Soft. Bowel sounds are normal. He exhibits no distension and no mass. There is no tenderness. There is no rebound and no guarding.  Musculoskeletal: He exhibits no edema.  Lymphadenopathy:    He has no cervical adenopathy.  Neurological: He is alert and oriented to person, place, and time. He has normal reflexes. He displays normal reflexes. No cranial nerve deficit. He exhibits normal muscle tone. Coordination normal.  Skin: Skin is warm. No rash noted. He is not  diaphoretic. No erythema. No pallor.  Psychiatric: He has a normal mood and affect. His behavior is normal. Judgment and thought content normal.  Vitals reviewed.         Assessment & Plan:  General medical exam - Plan: CBC with Differential/Platelet, COMPLETE METABOLIC PANEL WITH GFR, Lipid panel, PSA, Microalbumin, urine  Uncontrolled type 2 diabetes mellitus with hyperosmolarity without coma, without long-term current use of insulin [facility])  Hospital discharge follow-up  Essential hypertension  ASCVD (arteriosclerotic cardiovascular disease)  Benign essential HTN  Patient finally seems motivated to manage his risk factors. His blood pressure is slightly low however I would like to try to keep the patient on metoprolol and lisinopril as prescribed. I've asked the patient to discontinue his Lasix and to call me back later this week. He continues to report orthostatic dizziness, I would decrease his metoprolol to 50%. I will check a fasting lipid panel. His goal LDL cholesterol is less than 70. He is not due to recheck a hemoglobin A1c because it was just checked in the hospital and was 7.8. I will add Jardiance 25 mg by mouth daily tell manage his blood sugar and for cardiovascular benefit. I encouraged diet exercise and weight loss as his body recovers from his surgery.  I will screen the patient for prostate cancer with a PSA. He is due for colonoscopy. However we will put this on the back burner until he is recovered from his open heart surgery. He will call me back later this summer after he feels better we can reschedule that

## 2016-05-02 ENCOUNTER — Telehealth: Payer: Self-pay

## 2016-05-02 ENCOUNTER — Encounter (HOSPITAL_COMMUNITY): Payer: Self-pay | Admitting: *Deleted

## 2016-05-02 ENCOUNTER — Emergency Department (HOSPITAL_BASED_OUTPATIENT_CLINIC_OR_DEPARTMENT_OTHER)
Admit: 2016-05-02 | Discharge: 2016-05-02 | Disposition: A | Discharge status: Home / Self Care | Payer: BLUE CROSS/BLUE SHIELD | Attending: Physician Assistant | Admitting: Physician Assistant

## 2016-05-02 ENCOUNTER — Emergency Department (HOSPITAL_COMMUNITY)
Admission: EM | Admit: 2016-05-02 | Discharge: 2016-05-03 | Disposition: A | Discharge status: Home / Self Care | Payer: BLUE CROSS/BLUE SHIELD | Attending: Emergency Medicine | Admitting: Emergency Medicine

## 2016-05-02 DIAGNOSIS — M79605 Pain in left leg: Secondary | ICD-10-CM | POA: Diagnosis not present

## 2016-05-02 DIAGNOSIS — I10 Essential (primary) hypertension: Secondary | ICD-10-CM | POA: Diagnosis not present

## 2016-05-02 DIAGNOSIS — Z87891 Personal history of nicotine dependence: Secondary | ICD-10-CM | POA: Insufficient documentation

## 2016-05-02 DIAGNOSIS — Z951 Presence of aortocoronary bypass graft: Secondary | ICD-10-CM | POA: Insufficient documentation

## 2016-05-02 DIAGNOSIS — Z955 Presence of coronary angioplasty implant and graft: Secondary | ICD-10-CM | POA: Insufficient documentation

## 2016-05-02 DIAGNOSIS — I252 Old myocardial infarction: Secondary | ICD-10-CM | POA: Diagnosis not present

## 2016-05-02 DIAGNOSIS — M79609 Pain in unspecified limb: Secondary | ICD-10-CM

## 2016-05-02 DIAGNOSIS — Z7982 Long term (current) use of aspirin: Secondary | ICD-10-CM | POA: Insufficient documentation

## 2016-05-02 DIAGNOSIS — Z7984 Long term (current) use of oral hypoglycemic drugs: Secondary | ICD-10-CM | POA: Diagnosis not present

## 2016-05-02 DIAGNOSIS — M79652 Pain in left thigh: Secondary | ICD-10-CM | POA: Diagnosis not present

## 2016-05-02 DIAGNOSIS — E1140 Type 2 diabetes mellitus with diabetic neuropathy, unspecified: Secondary | ICD-10-CM | POA: Insufficient documentation

## 2016-05-02 DIAGNOSIS — I2510 Atherosclerotic heart disease of native coronary artery without angina pectoris: Secondary | ICD-10-CM | POA: Insufficient documentation

## 2016-05-02 LAB — MICROALBUMIN, URINE: Microalb, Ur: 1.4 mg/dL

## 2016-05-02 LAB — PSA: PSA: 0.5 ng/mL (ref <=4.0)

## 2016-05-02 MED ORDER — METHOCARBAMOL 500 MG PO TABS
750.0000 mg | ORAL_TABLET | Freq: Once | ORAL | Status: AC
  Administered 2016-05-03: 750 mg via ORAL
  Filled 2016-05-02: qty 2

## 2016-05-02 MED ORDER — ACETAMINOPHEN 325 MG PO TABS
650.0000 mg | ORAL_TABLET | Freq: Once | ORAL | Status: AC
  Administered 2016-05-03: 650 mg via ORAL
  Filled 2016-05-02: qty 2

## 2016-05-02 MED ORDER — METHOCARBAMOL 500 MG PO TABS: 500.0000 mg | ORAL_TABLET | Freq: Two times a day (BID) | ORAL | 0 refills | Status: DC

## 2016-05-02 NOTE — ED Notes (Signed)
Pt presents with intermittent sharp stabbing pain to L thigh. Pt also reports intermittent tingling to L thigh. Pain to L thigh worse with movement and deep touch.  Pt is 9 days CABG post op. Pt denies shob, denies CP.  Pt sent to ED for vascular study

## 2016-05-02 NOTE — Discharge Instructions (Signed)
Please read and follow all provided instructions.  Your diagnoses today include:  1. Left thigh pain     Tests performed today include: Vital signs. See below for your results today.   Medications prescribed:  Take as prescribed   Home care instructions:  Follow any educational materials contained in this packet.  Follow-up instructions: Please follow-up with your primary care provider for further evaluation of symptoms and treatment   Return instructions:  Please return to the Emergency Department if you do not get better, if you get worse, or new symptoms OR  - Fever (temperature greater than 101.62F)  - Bleeding that does not stop with holding pressure to the area    -Severe pain (please note that you may be more sore the day after your accident)  - Chest Pain  - Difficulty breathing  - Severe nausea or vomiting  - Inability to tolerate food and liquids  - Passing out  - Skin becoming red around your wounds  - Change in mental status (confusion or lethargy)  - New numbness or weakness    Please return if you have any other emergent concerns.  Additional Information:  Your vital signs today were: BP 114/68 (BP Location: Right Arm)    Pulse 96    Temp 99.1 F (37.3 C) (Oral)    Resp 16    SpO2 100%  If your blood pressure (BP) was elevated above 135/85 this visit, please have this repeated by your doctor within one month. ---------------

## 2016-05-02 NOTE — Progress Notes (Signed)
*  Preliminary Results* Left lower extremity venous duplex completed. Left lower extremity is negative for deep vein thrombosis. There is no evidence of left Baker's cyst.  05/02/2016 6:36 PM  Maudry Mayhew, BS, RVT, RDCS, RDMS

## 2016-05-02 NOTE — Telephone Encounter (Signed)
Juan Ray called C/O extreme pain in his left leg with numbness. NO swelling. He is S/P CABG  with  Endoscopic vein harvest from the right leg.  04/23/16   He did contact his PCP but the MD was not available today to see him. I strongly recommended that he go to the ED @ [facility] for an ultrasound of the left leg to R/O DVT. [name] understood and agreed.

## 2016-05-02 NOTE — ED Triage Notes (Signed)
Patient moved into PEDS Triage room for Vascular study

## 2016-05-02 NOTE — ED Provider Notes (Signed)
[facility] DEPT Provider Note   CSN: [id number] Arrival date & time: 05/02/16  1719     History   Chief Complaint Chief Complaint  Patient presents with  . Leg Pain    HPI Juan Ray is a 56 y.o. male.  HPI  56 y.o. male with a hx of CAD, DM, HTN, Obesity, presents to the Emergency Department today complaining of left anterior thigh pain since recent CABG x 9 days ago. Pt states that the pain feels like a burning/cramp/sharp sensation isolated to thigh. No radiation into lower leg. Notes that movement seems to alleviate the symptoms, but rest will make it worse. Notes the pain is intermittent as the patient is not experiencing pain currently. Pt was told by friend to go to ED to r/o blood clot as he had recent operation. Of note, pt had central line placed in right thigh and not left during surgery. Denies CP/SOB/ABD pain. No fevers. Pt took hydrocodone with minimal relief. Pt states the pain is so unbearable at times that he cannot sleep. No other symptoms noted.    Past Medical History:  Diagnosis Date  . Anxiety   . Arthritis   . CAD (coronary artery disease) 04/07/2012   Inferoposterior STEMI s/p DES-mid LCx  . Diabetes mellitus   . Diabetic neuropathy ([place])   . GERD (gastroesophageal reflux disease)   . High cholesterol   . Hypertension   . Myocardial infarction ([place])   . Obesity   . Pancreatitis    a. mild by CT 06/2011  . Peptic ulcer disease    a. 06/2009 EGD: multiple gastric and duodenal ulcers.    Patient Active Problem List   Diagnosis Date Noted  . S/P CABG x 4 04/23/2016  . Abnormal stress test 04/13/2016  . Chest pain 03/11/2016  . Ischemic cardiomyopathy 07/02/2013  . Midsternal chest pain 07/11/2012  . ST elevation myocardial infarction (STEMI) of inferoposterior wall ([place]) 04/09/2012  . CAD (coronary artery disease), native coronary artery 04/09/2012  . PUD (peptic ulcer disease) 04/09/2012  . Epigastric pain 06/18/2011  . HTN (hypertension)  06/18/2011  . DM (diabetes mellitus) ([place]) 06/18/2011  . Hyperlipidemia 06/18/2011    Past Surgical History:  Procedure Laterality Date  . CORONARY ANGIOPLASTY WITH STENT PLACEMENT  04/07/2012   70% prox LAD, 70-80% ostial diagonal, 70% mid-distal LAD, 80% prox OM1, mid LCx totally occluded just distal to OM1 s/p DES, occluded RCA; severe inferior HK, LVEF 45%  . CORONARY ARTERY BYPASS GRAFT N/A 04/23/2016   Procedure: CORONARY ARTERY BYPASS GRAFTING (CABG) x 4;  Surgeon: Gaye Pollack, MD;  Location: [facility] OR;  Service: Open Heart Surgery;  Laterality: N/A;  . ENDOVEIN HARVEST OF GREATER SAPHENOUS VEIN Right 04/23/2016   Procedure: ENDOVEIN HARVEST OF GREATER SAPHENOUS VEIN;  Surgeon: Gaye Pollack, MD;  Location: [facility];  Service: Open Heart Surgery;  Laterality: Right;  . ESOPHAGOGASTRODUODENOSCOPY  06/18/2011   NL ESOPHAGUS/UlcerATED LESIONS/ SUPERFICIAL Ulcers  . INTRAVASCULAR PRESSURE WIRE/FFR STUDY N/A 04/13/2016   Procedure: Intravascular Pressure Wire/FFR Study;  Surgeon: Nelva Bush, MD;  Location: [facility];  Service: Cardiovascular;  Laterality: N/A;  . LEFT HEART CATH AND CORONARY ANGIOGRAPHY N/A 04/13/2016   Procedure: Left Heart Cath and Coronary Angiography;  Surgeon: Nelva Bush, MD;  Location: [facility];  Service: Cardiovascular;  Laterality: N/A;  . LEFT HEART CATHETERIZATION WITH CORONARY ANGIOGRAM N/A 04/07/2012   Procedure: LEFT HEART CATHETERIZATION WITH CORONARY ANGIOGRAM;  Surgeon: Sherren Mocha, MD;  Location:  [facility] CATH LAB;  Service: Cardiovascular;  Laterality: N/A;  . PERCUTANEOUS CORONARY STENT INTERVENTION (PCI-S)  04/07/2012   Procedure: PERCUTANEOUS CORONARY STENT INTERVENTION (PCI-S);  Surgeon: Sherren Mocha, MD;  Location: [facility] CATH LAB;  Service: Cardiovascular;;  . TEE WITHOUT CARDIOVERSION N/A 04/23/2016   Procedure: TRANSESOPHAGEAL ECHOCARDIOGRAM (TEE);  Surgeon: Gaye Pollack, MD;  Location: [facility];  Service: Open Heart Surgery;  Laterality:  N/A;       Home Medications    Prior to Admission medications   Medication Sig Start Date End Date Taking? Authorizing Provider  acetaminophen (TYLENOL) 325 MG tablet Take 2 tablets (650 mg total) by mouth every 6 (six) hours as needed for mild pain. 04/27/16   Erin R Barrett, PA-C  aspirin EC 81 MG EC tablet Take 1 tablet (81 mg total) by mouth daily. 04/09/12   Roger A Arguello, PA-C  atorvastatin (LIPITOR) 40 MG tablet Take 1 tablet (40 mg total) by mouth daily. Patient taking differently: Take 40 mg by mouth at bedtime.  04/13/16   Nelva Bush, MD  docusate sodium (COLACE) 100 MG capsule Take 200 mg by mouth at bedtime.    Historical Provider, MD  empagliflozin (JARDIANCE) 25 MG TABS tablet Take 25 mg by mouth daily. 05/01/16   Susy Frizzle, MD  escitalopram (LEXAPRO) 10 MG tablet Take 10 mg by mouth at bedtime.  03/15/16   Historical Provider, MD  furosemide (LASIX) 40 MG tablet Take 1 tablet (40 mg total) by mouth daily. For 5 days 04/27/16   Erin R Barrett, PA-C  gabapentin (NEURONTIN) 300 MG capsule Take 300 mg by mouth 3 (three) times daily.  03/29/16   Historical Provider, MD  glipiZIDE (GLUCOTROL) 10 MG tablet TAKE ONE TABLET BY MOUTH TWICE DAILY BEFORE A MEAL 02/02/16   Susy Frizzle, MD  lisinopril (PRINIVIL,ZESTRIL) 5 MG tablet Take 1 tablet (5 mg total) by mouth daily. 04/27/16   Erin R Barrett, PA-C  metFORMIN (GLUCOPHAGE) 1000 MG tablet Take 1 tablet (1,000 mg total) by mouth 2 (two) times daily. 02/02/16   Susy Frizzle, MD  metoprolol tartrate (LOPRESSOR) 25 MG tablet Take 1 tablet (25 mg total) by mouth 2 (two) times daily. 04/09/16   Bhavinkumar Bhagat, PA  nitroGLYCERIN (NITROSTAT) 0.4 MG SL tablet Place 1 tablet (0.4 mg total) under the tongue every 5 (five) minutes as needed for chest pain. X 3 doses 04/13/16   Nelva Bush, MD  oxyCODONE (OXY IR/ROXICODONE) 5 MG immediate release tablet Take 1-2 tablets (5-10 mg total) by mouth every 3 (three) hours as needed for  severe pain. 04/27/16   Erin R Barrett, PA-C  pantoprazole (PROTONIX) 40 MG tablet Take 1 tablet (40 mg total) by mouth daily. 04/09/16   Bhavinkumar Bhagat, PA  potassium chloride SA (K-DUR,KLOR-CON) 20 MEQ tablet Take 1 tablet (20 mEq total) by mouth daily. For 5 days 04/27/16   Freddrick March, PA-C    Family History Family History  Problem Relation Age of Onset  . Heart attack Father     MI x 2 in late 50's, currently 32's  . CVA Sister     late 23s  . CAD Maternal Uncle     MI at 85    Social History Social History  Substance Use Topics  . Smoking status: Former Smoker    Years: 20.00    Quit date: 2000  . Smokeless tobacco: Former Systems developer    Quit date: 01/01/1998  . Alcohol use No  Allergies   No known allergies   Review of Systems Review of Systems ROS reviewed and all are negative for acute change except as noted in the HPI.  Physical Exam Updated Vital Signs BP 114/68 (BP Location: Right Arm)   Pulse 96   Temp 99.1 F (37.3 C) (Oral)   Resp 16   SpO2 100%   Physical Exam  Constitutional: He is oriented to person, place, and time. Vital signs are normal. He appears well-developed and well-nourished.  HENT:  Head: Normocephalic and atraumatic.  Right Ear: Hearing normal.  Left Ear: Hearing normal.  Eyes: Conjunctivae and EOM are normal. Pupils are equal, round, and reactive to light.  Neck: Normal range of motion. Neck supple.  Cardiovascular: Normal rate, regular rhythm, normal heart sounds and intact distal pulses.   Pulmonary/Chest: Effort normal and breath sounds normal.  Abdominal: Soft. There is no tenderness.  Musculoskeletal: Normal range of motion.  Left thigh without obvious visual or palpable deformities. Non TTP. Noted pain along sartorius muscle. Strong distal pulses. Cap refill <2 sec. Strong femoral pulse   Neurological: He is alert and oriented to person, place, and time.  Skin: Skin is warm and dry.  Psychiatric: He has a normal mood and  affect. His speech is normal and behavior is normal. Thought content normal.  Nursing note and vitals reviewed.  ED Treatments / Results  Labs (all labs ordered are listed, but only abnormal results are displayed) Labs Reviewed - No data to display  EKG  EKG Interpretation None       Radiology No results found.  Procedures Procedures (including critical care time)  Medications Ordered in ED Medications - No data to display   Initial Impression / Assessment and Plan / ED Course  I have reviewed the triage vital signs and the nursing notes.  Pertinent labs & imaging results that were available during my care of the patient were reviewed by me and considered in my medical decision making (see chart for details).  Final Clinical Impressions(s) / ED Diagnoses   {I have reviewed and evaluated the relevant imaging studies.  {I have reviewed the relevant previous healthcare records.  {I obtained HPI from historian. {Patient discussed with supervising physician.  ED Course:  Assessment: Pt is a 56 y.o. male with hx  CAD, DM, HTN, Obesity, who presents with left thigh pain s/p CABG done x 9 days ago. Notes intermittent pain that feels like burning sensation. Improved with ambulation. No radiation into lower leg. No fevers. No CP/SOB. Of noted central line was placed in right thigh and not left during surgery. On exam, pt in NAD. Nontoxic/nonseptic appearing. VSS. Afebrile. Lungs CTA. Heart RRR. Abdomen nontender soft. Area of concern without obvious visible or palpable deformities. Strong distal pulses as well as femoral pulse. No signs of infection. No swelling. DVT US negative. Discussed with attending physician. Possible musculoskeletal etiology vs nerve issue. No acute abnormalities identified.  Plan is to DC home with follow up to PCP. At time of discharge, Patient is in no acute distress. Vital Signs are stable. Patient is able to ambulate. Patient able to tolerate PO.    Disposition/Plan:  DC Home Additional Verbal discharge instructions given and discussed with patient.  Pt Instructed to f/u with PCP in the next week for evaluation and treatment of symptoms. Return precautions given Pt acknowledges and agrees with plan  Supervising Physician Daleen Bo, MD  Final diagnoses:  Left thigh pain    New Prescriptions New Prescriptions  No medications on file     Shary Decamp, PA-C 05/02/16 [place], MD 05/03/16 [phone]

## 2016-05-02 NOTE — ED Triage Notes (Signed)
Pt reports recent hx of cardiac surgery 9 days ago, having numbness sensation to left upper leg since surgery. Has intermittent stabbing, burning pain to his thigh. Sent here to r/o dvt.

## 2016-05-03 ENCOUNTER — Encounter: Payer: Self-pay | Admitting: Family Medicine

## 2016-05-09 ENCOUNTER — Telehealth: Payer: Self-pay | Admitting: Family Medicine

## 2016-05-09 NOTE — Telephone Encounter (Signed)
Pt went to ER for leg pain (thinking maybe blood clot) no clot and was given muscle relaxer. Wife states that ER MD suggested that maybe we could increase his Gabapentin dosage and she was calling to see if you would increase it??  CB# [phone]

## 2016-05-10 ENCOUNTER — Encounter: Payer: Self-pay | Admitting: Physician Assistant

## 2016-05-10 MED ORDER — GABAPENTIN 600 MG PO TABS: 600.0000 mg | ORAL_TABLET | Freq: Three times a day (TID) | ORAL | 5 refills | Status: DC

## 2016-05-10 NOTE — Telephone Encounter (Signed)
Pt's wife aware - he just got this refilled so he will gradually work up to 600 tid and then call back for new rx

## 2016-05-10 NOTE — Progress Notes (Signed)
Cardiology Office Note    Date:  05/14/2016   ID:  Juan Ray, DOB 1960/07/20, MRN [id number]  PCP:  Susy Frizzle, MD  Cardiologist:  Dr. Burt Knack  Chief Complaint: Hospital follow up s/p CABG  History of Present Illness:   Juan Ray is a 56 y.o. male CAD, DM2 with neuropathy, HTN, HL, PUD presents for hospital follow up.  Hx of STEMI 04/2012 treated with Promus DES to [place]. Residual disease treated medically  Admitted 3/12-3/13 for recurrent chest pain. Features worrisome for unstable angina. Ruled out and discharged with outpatient stress test which is high risk based on large scar and decreased LVEF. Fairly mild area of myocardium currently at jeopardy in the anterior wall. After discussion with Dr. Burt Knack he was set up of outpatient cath. He underwent cath on 04/13/2016 showing severe 3-vessel CAD with an LVEF of 35-40%. Subsequent underwent outpatient CABG x 4 (LIMA to LAD, sequential SVG to OM1 & OM2 and SVG to  dist RCA). No post op complications.   Seen in ER 05/02/16 for left thigh pain. LE doppler negative for DVT.   Here today for follow up. He continues to have left upper lateral thigh numbness and burning sensation despite trial of muscle relaxant. PCP has increased Gabapentin 2 days ago. Intermittent tiredness at the base to low blood pressure. Internal movement of left thigh makes his numbness better. Laying on left-sided make this worse. No dizziness, syncope, lower extremity edema. His pain walking without any chest discomfort or shortness of breath. Minimal use of narcotics.  Past Medical History:  Diagnosis Date  . Anxiety   . Arthritis   . CAD (coronary artery disease) 04/07/2012   Inferoposterior STEMI s/p DES-mid LCx  . Diabetes mellitus   . Diabetic neuropathy ([place])   . GERD (gastroesophageal reflux disease)   . High cholesterol   . Hypertension   . Myocardial infarction ([place])   . Obesity   . Pancreatitis    a. mild by CT 06/2011  . Peptic ulcer  disease    a. 06/2009 EGD: multiple gastric and duodenal ulcers.    Past Surgical History:  Procedure Laterality Date  . CORONARY ANGIOPLASTY WITH STENT PLACEMENT  04/07/2012   70% prox LAD, 70-80% ostial diagonal, 70% mid-distal LAD, 80% prox OM1, mid LCx totally occluded just distal to OM1 s/p DES, occluded RCA; severe inferior HK, LVEF 45%  . CORONARY ARTERY BYPASS GRAFT N/A 04/23/2016   Procedure: CORONARY ARTERY BYPASS GRAFTING (CABG) x 4;  Surgeon: Gaye Pollack, MD;  Location: [facility];  Service: Open Heart Surgery;  Laterality: N/A;  . ENDOVEIN HARVEST OF GREATER SAPHENOUS VEIN Right 04/23/2016   Procedure: ENDOVEIN HARVEST OF GREATER SAPHENOUS VEIN;  Surgeon: Gaye Pollack, MD;  Location: [facility];  Service: Open Heart Surgery;  Laterality: Right;  . ESOPHAGOGASTRODUODENOSCOPY  06/18/2011   NL ESOPHAGUS/UlcerATED LESIONS/ SUPERFICIAL Ulcers  . INTRAVASCULAR PRESSURE WIRE/FFR STUDY N/A 04/13/2016   Procedure: Intravascular Pressure Wire/FFR Study;  Surgeon: Nelva Bush, MD;  Location: [facility];  Service: Cardiovascular;  Laterality: N/A;  . LEFT HEART CATH AND CORONARY ANGIOGRAPHY N/A 04/13/2016   Procedure: Left Heart Cath and Coronary Angiography;  Surgeon: Nelva Bush, MD;  Location: [place] [facility];  Service: Cardiovascular;  Laterality: N/A;  . LEFT HEART CATHETERIZATION WITH CORONARY ANGIOGRAM N/A 04/07/2012   Procedure: LEFT HEART CATHETERIZATION WITH CORONARY ANGIOGRAM;  Surgeon: Sherren Mocha, MD;  Location: [facility] CATH LAB;  Service: Cardiovascular;  Laterality: N/A;  .  PERCUTANEOUS CORONARY STENT INTERVENTION (PCI-S)  04/07/2012   Procedure: PERCUTANEOUS CORONARY STENT INTERVENTION (PCI-S);  Surgeon: Sherren Mocha, MD;  Location: [facility] CATH LAB;  Service: Cardiovascular;;  . TEE WITHOUT CARDIOVERSION N/A 04/23/2016   Procedure: TRANSESOPHAGEAL ECHOCARDIOGRAM (TEE);  Surgeon: Gaye Pollack, MD;  Location: [place];  Service: Open Heart Surgery;  Laterality: N/A;    Current  Medications:  Prior to Admission medications   Medication Sig Start Date End Date Taking? Authorizing Provider  acetaminophen (TYLENOL) 325 MG tablet Take 2 tablets (650 mg total) by mouth every 6 (six) hours as needed for mild pain. 04/27/16  Yes Barrett, Lodema Hong, PA-C  aspirin EC 81 MG EC tablet Take 1 tablet (81 mg total) by mouth daily. 04/09/12  Yes Arguello, Roger A, PA-C  atorvastatin (LIPITOR) 40 MG tablet Take 1 tablet (40 mg total) by mouth daily. Patient taking differently: Take 40 mg by mouth at bedtime.  04/13/16  Yes End, Harrell Gave, MD  docusate sodium (COLACE) 100 MG capsule Take 200 mg by mouth at bedtime.   Yes [provider]  empagliflozin (JARDIANCE) 25 MG TABS tablet Take 25 mg by mouth daily. 05/01/16  Yes Susy Frizzle, MD  escitalopram (LEXAPRO) 10 MG tablet Take 10 mg by mouth at bedtime.  03/15/16  Yes [provider]  gabapentin (NEURONTIN) 600 MG tablet Take 1 tablet (600 mg total) by mouth 3 (three) times daily. 05/10/16  Yes Susy Frizzle, MD  glipiZIDE (GLUCOTROL) 10 MG tablet TAKE ONE TABLET BY MOUTH TWICE DAILY BEFORE A MEAL 02/02/16  Yes Susy Frizzle, MD  lisinopril (PRINIVIL,ZESTRIL) 5 MG tablet Take 1 tablet (5 mg total) by mouth daily. 04/27/16  Yes Barrett, Erin R, PA-C  metFORMIN (GLUCOPHAGE) 1000 MG tablet Take 1 tablet (1,000 mg total) by mouth 2 (two) times daily. 02/02/16  Yes Susy Frizzle, MD  metoprolol tartrate (LOPRESSOR) 25 MG tablet Take 1 tablet (25 mg total) by mouth 2 (two) times daily. 04/09/16  Yes Henli Hey, PA  nitroGLYCERIN (NITROSTAT) 0.4 MG SL tablet Place 1 tablet (0.4 mg total) under the tongue every 5 (five) minutes as needed for chest pain. X 3 doses 04/13/16  Yes End, Harrell Gave, MD  oxyCODONE (OXY IR/ROXICODONE) 5 MG immediate release tablet Take 1-2 tablets (5-10 mg total) by mouth every 3 (three) hours as needed for severe pain. 04/27/16  Yes Barrett, Erin R, PA-C  pantoprazole (PROTONIX) 40 MG tablet  Take 1 tablet (40 mg total) by mouth daily. 04/09/16  Yes Lesleigh Hughson, Crista Luria, PA     Allergies:   No known allergies   Social History   Social History  . Marital status: Married    Spouse name: N/A  . Number of children: N/A  . Years of education: N/A   Occupational History  . truck driver    Social History Main Topics  . Smoking status: Former Smoker    Years: 20.00    Quit date: 2000  . Smokeless tobacco: Former Systems developer    Quit date: 01/01/1998  . Alcohol use No  . Drug use: No  . Sexual activity: Yes   Other Topics Concern  . None   Social History Narrative   Lives in [place], with his wife and 15 yr old son.     Family History:  The patient's family history includes CAD in his maternal uncle; CVA in his sister; Heart attack in his father.   ROS:   Please see the history of present illness.  ROS All other systems reviewed and are negative.   PHYSICAL EXAM:   VS:  BP (!) 84/60   Pulse 97   Ht 5\' 9"  (1.753 m)   Wt 223 lb (101.2 kg)   SpO2 98%   BMI 32.93 kg/m    GEN: Well nourished, well developed, in no acute distress  HEENT: normal  Neck: no JVD, carotid bruits, or masses Cardiac:  RRR; no murmurs, rubs, or gallops,no edema, mid sternal surgical scar Respiratory:  clear to auscultation bilaterally, normal work of breathing GI: soft, nontender, nondistended, + BS MS: no deformity or atrophy  Skin: warm and dry, no rash Neuro:  Alert and Oriented x 3, Strength are  Intact bilaterally.  Psych: euthymic mood, full affect  Wt Readings from Last 3 Encounters:  05/14/16 223 lb (101.2 kg)  05/01/16 231 lb (104.8 kg)  04/27/16 246 lb 8 oz (111.8 kg)      Studies/Labs Reviewed:   EKG:  EKG is not ordered today.    Recent Labs: 03/11/2016: TSH 0.354 04/24/2016: Magnesium 1.9 05/01/2016: ALT 50; BUN 16; Creat 0.98; Hemoglobin 14.2; Platelets 489; Potassium 4.9; Sodium 135   Lipid Panel    Component Value Date/Time   CHOL 96 05/01/2016 0856   TRIG 144  05/01/2016 0856   HDL 22 (L) 05/01/2016 0856   CHOLHDL 4.4 05/01/2016 0856   VLDL 29 05/01/2016 0856   LDLCALC 45 05/01/2016 0856    Additional studies/ records that were reviewed today include:   Echocardiogram: 04/19/16 Study Conclusions  - Left ventricle: The cavity size was normal. There was moderate   concentric hypertrophy. Systolic function was normal. The   estimated ejection fraction was in the range of 55% to 60%. There   is akinesis of the inferior myocardium. Doppler parameters are   consistent with abnormal left ventricular relaxation (grade 1   diastolic dysfunction). - Aortic valve: Trileaflet; mildly thickened, moderately calcified   leaflets.  Cardiac Catheterization: 04/13/16 Intravascular Pressure Wire/FFR Study  Left Heart Cath and Coronary Angiography  Conclusion   Conclusions: 1. Significant 3-vessel coronary artery disease, including sequential 50-70% ostial, proximal, and mid LAD stenoses, which are hemodynamically significant (resting Pd/Pa 0.72), 50% in-stent restenosis in mid LCx as well as 80% 90% stenoses involving OM2 and distal LCx, and chronic total occlusion of mid RCA with distal vessel filling via bridging and left-to-right collaterals. 2. Upper normal left ventricular filling pressure. 3. Moderately reduced left ventricular contraction with inferior akinesis (LVEF 35-40%).  Recommendations: 1. Cardiac surgery consultation for CABG, given significant 3-vessel coronary artery disease, reduced LV function, and diabetes mellitus. 2. Aggressive secondary prevention, including escalation of statin therapy. 3. Start isosorbide mononitrate 30 mg daily; if patient experiences side effects, he can decrease the dose to 15 mg daily. 4. Proceed with transthoracic echocardiogram, as previously ordered.    Coronary artery bypass grafting x 4 -  04/23/16   Left internal mammary graft to the LAD  Sequential SVG to OM1 and OM2  SVG to distal RCA   LE  venous doppler 05/02/16 Summary:  - No evidence of deep vein thrombosis involving the left lower   extremity and right common femoral vein. - No evidence of Baker&'s cyst on the left.  Other specific details can be found in the table(s) above. Prepared and Electronically Authenticated by  ASSESSMENT & PLAN:    1. CAD s/p Promus DES to [place] in 04/2012; CABG x 4 04/2016 - No angina. Continue aspirin, BB, ACE and statin.  2. HTN - Hypotensive recently. Blood pressure of 86/60 today. We will reduce lisinopril to 2.5 mg daily. Continue metoprolol 25 mg twice a day.  HR at upper normal limit.  3. HLD - 05/01/2016: Cholesterol 96; HDL 22; LDL Cholesterol 45; Triglycerides 144; VLDL 29  - Continue statin  4. L thigh numbness and burning sensation. - No injury or fall. No prior history. Symptoms started 5 days after discharge from hospital. Lower extremity Doppler did not showed DVT. No improvement after trial of muscle relaxant. Currently on higher dose of gabapentin by PCP. He has endoscopic vein harvest from the right leg--> this did not explain his symptoms. ? Nerve or back issue. Advised to follow up with PCP. Continue walking.     Medication Adjustments/Labs and Tests Ordered: Current medicines are reviewed at length with the patient today.  Concerns regarding medicines are outlined above.  Medication changes, Labs and Tests ordered today are listed in the Patient Instructions below. Patient Instructions  Medication Instructions:  DECREASE Lisinopril to 2.5 mg once daily   Labwork: None Ordered   Testing/Procedures: None Ordered   Follow-Up: Your physician recommends that you schedule a follow-up appointment in: 3-4 months with Dr. Burt Knack   If you need a refill on your cardiac medications before your next appointment, please call your pharmacy.   Thank you for choosing [facility]! Christen Bame, RN [phone]       Signed, Leanor Kail, [place]    05/14/2016 1:52 PM    [facility] [place], [place], [place]  [address] Phone: [phone]; Fax: [phone]

## 2016-05-10 NOTE — Telephone Encounter (Signed)
He could take 600 tid if needed by I would gradually increase to that level.

## 2016-05-14 ENCOUNTER — Encounter: Payer: Self-pay | Admitting: Physician Assistant

## 2016-05-14 ENCOUNTER — Ambulatory Visit (INDEPENDENT_AMBULATORY_CARE_PROVIDER_SITE_OTHER): Payer: BLUE CROSS/BLUE SHIELD | Admitting: Physician Assistant

## 2016-05-14 VITALS — BP 84/60 | HR 97 | Ht 69.0 in | Wt 223.0 lb

## 2016-05-14 DIAGNOSIS — E785 Hyperlipidemia, unspecified: Secondary | ICD-10-CM

## 2016-05-14 DIAGNOSIS — R200 Anesthesia of skin: Secondary | ICD-10-CM | POA: Diagnosis not present

## 2016-05-14 DIAGNOSIS — I952 Hypotension due to drugs: Secondary | ICD-10-CM

## 2016-05-14 DIAGNOSIS — I10 Essential (primary) hypertension: Secondary | ICD-10-CM | POA: Diagnosis not present

## 2016-05-14 DIAGNOSIS — I25709 Atherosclerosis of coronary artery bypass graft(s), unspecified, with unspecified angina pectoris: Secondary | ICD-10-CM

## 2016-05-14 MED ORDER — LISINOPRIL 2.5 MG PO TABS: 2.5000 mg | ORAL_TABLET | Freq: Every day | ORAL | 3 refills | Status: DC

## 2016-05-14 NOTE — Patient Instructions (Signed)
Medication Instructions:  DECREASE Lisinopril to 2.5 mg once daily   Labwork: None Ordered   Testing/Procedures: None Ordered   Follow-Up: Your physician recommends that you schedule a follow-up appointment in: 3-4 months with Dr. Burt Knack   If you need a refill on your cardiac medications before your next appointment, please call your pharmacy.   Thank you for choosing [facility]! Christen Bame, RN [phone]

## 2016-05-15 DIAGNOSIS — H40033 Anatomical narrow angle, bilateral: Secondary | ICD-10-CM | POA: Diagnosis not present

## 2016-05-15 DIAGNOSIS — E119 Type 2 diabetes mellitus without complications: Secondary | ICD-10-CM | POA: Diagnosis not present

## 2016-05-22 ENCOUNTER — Telehealth: Payer: Self-pay | Admitting: Physician Assistant

## 2016-05-22 NOTE — Telephone Encounter (Signed)
Attempted to contact patient to assess how he is doing post operatively.  Unfortunately he did not answer the call.  Events since hospital discharge noted.  Will attempt to reach patient another time, however she does follow up with Dr. Cyndia Bent 5/30

## 2016-05-22 NOTE — Telephone Encounter (Signed)
Mr. Campione returned phone call from earlier today.  Overall he is doing well.  He does have complaint of severe left leg discomfort.  He describes it as being a sharp burning pain in his left thigh.  He contacted the office about this and he was sent to the ED which workup was negative for DVT which I would have expected as symptoms sound like neuropathy.  There was no surgery performed on his left leg.  He otherwise ambulates without difficulty.  He is taking his medications as prescribed.  Encouraged patient to continue current ambulation regimen.  Unfortunately, unsure of cause of LLE neuropathy symptoms.  He has tried increased doses of Neurontin without relief.  His PCP is working this up.  He is scheduled for follow up for 5/30 with Dr. Cyndia Bent

## 2016-05-23 ENCOUNTER — Ambulatory Visit (INDEPENDENT_AMBULATORY_CARE_PROVIDER_SITE_OTHER): Payer: BLUE CROSS/BLUE SHIELD | Admitting: Family Medicine

## 2016-05-23 ENCOUNTER — Encounter: Payer: Self-pay | Admitting: Family Medicine

## 2016-05-23 VITALS — BP 80/56 | HR 94 | Temp 98.1°F | Resp 20 | Ht 69.0 in | Wt 222.0 lb

## 2016-05-23 DIAGNOSIS — G629 Polyneuropathy, unspecified: Secondary | ICD-10-CM | POA: Diagnosis not present

## 2016-05-23 DIAGNOSIS — M5416 Radiculopathy, lumbar region: Secondary | ICD-10-CM

## 2016-05-23 DIAGNOSIS — M79605 Pain in left leg: Secondary | ICD-10-CM | POA: Diagnosis not present

## 2016-05-23 DIAGNOSIS — M79604 Pain in right leg: Secondary | ICD-10-CM | POA: Diagnosis not present

## 2016-05-23 MED ORDER — OXYCODONE HCL 5 MG PO TABS: 5.0000 mg | ORAL_TABLET | ORAL | 0 refills | Status: DC | PRN

## 2016-05-23 NOTE — Progress Notes (Signed)
Subjective:    Patient ID: Juan Ray, male    DOB: [birth year], 58 y.o.   MRN: [id number]  HPI   02/07/16 Patient has 2 problems.  First he reports a burning pins and needles pain in both feet. He states that his feet feel like they're on fire. He keeps him awake at night. The pain can be severe. He has a difficult time sleeping. He has a history of uncontrolled diabetes mellitus. Second issue is itching all over his body. He has been attributing this to the diabetes however his son recently came to visit him and exposed him to scabies. He has been treated once but he continues to itch on his torso and on his trunk. He is clawing at his skin with sticks and a back scratcher as it is itching so.  At that time, my plan was:  I'll treat his diabetic neuropathy in his feet and legs with gabapentin 300 mg by mouth every 8 hours when necessary pain. I cautioned the patient about sedation and dizziness. However I believe the itching in his trunk and on his back may be residual scabies infestation. I recommended trying a repeat course of treatment with Elimite cream. He is to apply this head to toe and rinse off after 8 hours and repeat in 1 week. Also recommended washing all his bed close, etc. to avoid reinfection.  02/21/16 This taking gabapentin 300 mg 3 times a day, the patient states that the pins and needles sensation in his feet is approximately 40% better. The pain in his legs has improved but is still keeps him awake at night and causes him pain throughout the day. He is interested in other options to help manage the pain. He also believes he may have broken a rib on the left side. He felt a pop while pressing against his chest at work. He is nontender to palpation over the rib just below his left nipple. His blood pressure today is also elevated. This is the second time his blood pressure is been elevated. He is on lisinopril 2.5 mg a day.  At that time, my plan was: Patient states that his blood  sugar now down to 150. I'm concerned by his elevated blood pressure. I will increase his lisinopril to 20 mg a day and recheck his blood pressure in one month. I gave the patient the option of increasing gabapentin to 600 mg 3 times a day versus switching gabapentin to Lyrica 100 mg by mouth 3 times a day. He would like to try the Lyrica first. Also gave him Percocet 7.5/325 but I recommended one half tablet every 6 hours as needed for rib pain. I also cautioned him against taking the pain medication with the Lyrica until he sees how his body adjusts to the Lyrica  05/23/16 The neuropathic pain in both lower extremities is worsening. He describes the pain in his anterior, lateral left thigh that feels like a blow torch burning his skin. The pain is 8 on a scale of 10. It radiates from his upper thigh to his knee. He also has similar burning severe searing pain in his right upper thigh. The skin on the anterior portion of both eyes is numb to the touch. He also reports pins and needles pain in both feet however he denies numbness. He reports electrical like pain that races through both legs. Previously I diagnosed him with diabetic neuropathy. He is currently on gabapentin 600 mg 3 times a day with minimal  relief. He could not tolerate Lyrica. He took some oxycodone that he had left over from surgery last night and this eased the pain slightly Past Medical History:  Diagnosis Date  . Anxiety   . Arthritis   . CAD (coronary artery disease) 04/07/2012   Inferoposterior STEMI s/p DES-mid LCx  . Diabetes mellitus   . Diabetic neuropathy ([place])   . GERD (gastroesophageal reflux disease)   . High cholesterol   . Hypertension   . Myocardial infarction ([place])   . Obesity   . Pancreatitis    a. mild by CT 06/2011  . Peptic ulcer disease    a. 06/2009 EGD: multiple gastric and duodenal ulcers.   Past Surgical History:  Procedure Laterality Date  . CORONARY ANGIOPLASTY WITH STENT PLACEMENT  04/07/2012   70% prox  LAD, 70-80% ostial diagonal, 70% mid-distal LAD, 80% prox OM1, mid LCx totally occluded just distal to OM1 s/p DES, occluded RCA; severe inferior HK, LVEF 45%  . CORONARY ARTERY BYPASS GRAFT N/A 04/23/2016   Procedure: CORONARY ARTERY BYPASS GRAFTING (CABG) x 4;  Surgeon: Gaye Pollack, MD;  Location: [facility];  Service: Open Heart Surgery;  Laterality: N/A;  . ENDOVEIN HARVEST OF GREATER SAPHENOUS VEIN Right 04/23/2016   Procedure: ENDOVEIN HARVEST OF GREATER SAPHENOUS VEIN;  Surgeon: Gaye Pollack, MD;  Location: [facility];  Service: Open Heart Surgery;  Laterality: Right;  . ESOPHAGOGASTRODUODENOSCOPY  06/18/2011   NL ESOPHAGUS/UlcerATED LESIONS/ SUPERFICIAL Ulcers  . INTRAVASCULAR PRESSURE WIRE/FFR STUDY N/A 04/13/2016   Procedure: Intravascular Pressure Wire/FFR Study;  Surgeon: Nelva Bush, MD;  Location: [place] [facility];  Service: Cardiovascular;  Laterality: N/A;  . LEFT HEART CATH AND CORONARY ANGIOGRAPHY N/A 04/13/2016   Procedure: Left Heart Cath and Coronary Angiography;  Surgeon: Nelva Bush, MD;  Location: [place] [facility];  Service: Cardiovascular;  Laterality: N/A;  . LEFT HEART CATHETERIZATION WITH CORONARY ANGIOGRAM N/A 04/07/2012   Procedure: LEFT HEART CATHETERIZATION WITH CORONARY ANGIOGRAM;  Surgeon: Sherren Mocha, MD;  Location: [facility] CATH LAB;  Service: Cardiovascular;  Laterality: N/A;  . PERCUTANEOUS CORONARY STENT INTERVENTION (PCI-S)  04/07/2012   Procedure: PERCUTANEOUS CORONARY STENT INTERVENTION (PCI-S);  Surgeon: Sherren Mocha, MD;  Location: [facility] CATH LAB;  Service: Cardiovascular;;  . TEE WITHOUT CARDIOVERSION N/A 04/23/2016   Procedure: TRANSESOPHAGEAL ECHOCARDIOGRAM (TEE);  Surgeon: Gaye Pollack, MD;  Location: [place];  Service: Open Heart Surgery;  Laterality: N/A;   Current Outpatient Prescriptions on File Prior to Visit  Medication Sig Dispense Refill  . acetaminophen (TYLENOL) 325 MG tablet Take 2 tablets (650 mg total) by mouth every 6 (six) hours as needed  for mild pain.    Juan Kitchen aspirin EC 81 MG EC tablet Take 1 tablet (81 mg total) by mouth daily.    Juan Kitchen atorvastatin (LIPITOR) 40 MG tablet Take 1 tablet (40 mg total) by mouth daily. (Patient taking differently: Take 40 mg by mouth at bedtime. ) 30 tablet 5  . docusate sodium (COLACE) 100 MG capsule Take 200 mg by mouth at bedtime.    . empagliflozin (JARDIANCE) 25 MG TABS tablet Take 25 mg by mouth daily. 30 tablet 11  . escitalopram (LEXAPRO) 10 MG tablet Take 10 mg by mouth at bedtime.   0  . gabapentin (NEURONTIN) 600 MG tablet Take 1 tablet (600 mg total) by mouth 3 (three) times daily. 90 tablet 5  . glipiZIDE (GLUCOTROL) 10 MG tablet TAKE ONE TABLET BY MOUTH TWICE DAILY BEFORE A MEAL 60 tablet 3  .  lisinopril (PRINIVIL,ZESTRIL) 2.5 MG tablet Take 1 tablet (2.5 mg total) by mouth daily. 90 tablet 3  . metFORMIN (GLUCOPHAGE) 1000 MG tablet Take 1 tablet (1,000 mg total) by mouth 2 (two) times daily. 60 tablet 3  . metoprolol tartrate (LOPRESSOR) 25 MG tablet Take 1 tablet (25 mg total) by mouth 2 (two) times daily. 60 tablet 1  . nitroGLYCERIN (NITROSTAT) 0.4 MG SL tablet Place 1 tablet (0.4 mg total) under the tongue every 5 (five) minutes as needed for chest pain. X 3 doses 25 tablet prn  . pantoprazole (PROTONIX) 40 MG tablet Take 1 tablet (40 mg total) by mouth daily. 30 tablet 11   No current facility-administered medications on file prior to visit.    Allergies  Allergen Reactions  . No Known Allergies    Social History   Social History  . Marital status: Married    Spouse name: N/A  . Number of children: N/A  . Years of education: N/A   Occupational History  . truck driver    Social History Main Topics  . Smoking status: Former Smoker    Years: 20.00    Quit date: 2000  . Smokeless tobacco: Former Systems developer    Quit date: 01/01/1998  . Alcohol use No  . Drug use: No  . Sexual activity: Yes   Other Topics Concern  . Not on file   Social History Narrative   Lives in  [place], with his wife and 40 yr old son.      Review of Systems  All other systems reviewed and are negative.      Objective:   Physical Exam  Constitutional: He appears well-developed and well-nourished.  Neck: Neck supple.  Cardiovascular: Normal rate, regular rhythm, normal heart sounds and intact distal pulses.   Pulmonary/Chest: Effort normal and breath sounds normal. No respiratory distress. He has no wheezes. He has no rales. He exhibits tenderness.  Abdominal: Soft. Bowel sounds are normal. He exhibits no distension. There is no tenderness. There is no rebound.  Musculoskeletal: He exhibits tenderness.       Legs: Lymphadenopathy:    He has no cervical adenopathy.  Skin: No rash noted. No erythema.  Vitals reviewed. The area the patient experiences pain and numbness is demarcated in red on the diagram        Assessment & Plan:  Neuropathy - Plan: Vitamin B12, MR Lumbar Spine Wo Contrast, Nerve conduction test  Bilateral leg pain - Plan: MR Lumbar Spine Wo Contrast, Nerve conduction test  Bilateral lumbar radiculopathy - Plan: MR Lumbar Spine Wo Contrast, Nerve conduction test I still believe that the patient is having neuropathic pain however the pain is out of proportion to what I would expect from diabetic neuropathy. Furthermore he is failing gabapentin, has failed Lyrica. Proceed with an MRI of the lumbar spine to evaluate for lumbar radiculopathy as a possible cause such as spinal stenosis. Order nerve conduction test to evaluate for other possible causes of neuropathy.  Also check a B12 level. Thyroid test previously been normal. Temporarily gave the patient oxycodone 5 mg, 1-2 every 6 hours as needed for pain.

## 2016-05-24 LAB — VITAMIN B12: Vitamin B-12: 392 pg/mL (ref 200–1100)

## 2016-05-25 ENCOUNTER — Encounter: Payer: Self-pay | Admitting: Family Medicine

## 2016-05-29 ENCOUNTER — Other Ambulatory Visit: Payer: Self-pay | Admitting: Surgery

## 2016-05-29 ENCOUNTER — Encounter: Payer: Self-pay | Admitting: Family Medicine

## 2016-05-29 DIAGNOSIS — Z951 Presence of aortocoronary bypass graft: Secondary | ICD-10-CM

## 2016-05-30 ENCOUNTER — Ambulatory Visit
Admission: RE | Admit: 2016-05-30 | Discharge: 2016-05-30 | Disposition: A | Discharge status: Home / Self Care | Payer: BLUE CROSS/BLUE SHIELD | Source: Ambulatory Visit | Attending: Surgery | Admitting: Surgery

## 2016-05-30 ENCOUNTER — Encounter: Payer: Self-pay | Admitting: Surgery

## 2016-05-30 ENCOUNTER — Ambulatory Visit (INDEPENDENT_AMBULATORY_CARE_PROVIDER_SITE_OTHER): Payer: Self-pay | Admitting: Surgery

## 2016-05-30 VITALS — BP 96/88 | HR 66 | Resp 20 | Ht 69.0 in | Wt 220.0 lb

## 2016-05-30 DIAGNOSIS — I25110 Atherosclerotic heart disease of native coronary artery with unstable angina pectoris: Secondary | ICD-10-CM

## 2016-05-30 DIAGNOSIS — I25810 Atherosclerosis of coronary artery bypass graft(s) without angina pectoris: Secondary | ICD-10-CM | POA: Diagnosis not present

## 2016-05-30 DIAGNOSIS — Z951 Presence of aortocoronary bypass graft: Secondary | ICD-10-CM

## 2016-05-30 NOTE — Progress Notes (Signed)
HPI: Patient returns for routine postoperative follow-up having undergone CABG x 4 on 04/23/2016. The patient's early postoperative recovery while in the hospital was notable for an uncomplicated postop course. Since hospital discharge the patient reports that he has been walking without chest pain or shortness of breath. He has changed his diet dramatically and is eating smaller portions and has lost over 20 lbs so far. His Hgb A1c has gone from 10.2 in January to 7.8 on 04/19/2016. His only complaint is of burning pain along the left anterior thigh which he says started postop and can be severe at times. He has been treated with Neurontin without relief. His is scheduled for an MRI of the lumbar spine to evaluate for spinal stenosis.   Current Outpatient Prescriptions  Medication Sig Dispense Refill  . acetaminophen (TYLENOL) 325 MG tablet Take 2 tablets (650 mg total) by mouth every 6 (six) hours as needed for mild pain.    Marland Kitchen aspirin EC 81 MG EC tablet Take 1 tablet (81 mg total) by mouth daily.    Marland Kitchen atorvastatin (LIPITOR) 40 MG tablet Take 1 tablet (40 mg total) by mouth daily. (Patient taking differently: Take 40 mg by mouth at bedtime. ) 30 tablet 5  . docusate sodium (COLACE) 100 MG capsule Take 200 mg by mouth at bedtime.    . empagliflozin (JARDIANCE) 25 MG TABS tablet Take 25 mg by mouth daily. 30 tablet 11  . escitalopram (LEXAPRO) 10 MG tablet Take 10 mg by mouth at bedtime.   0  . gabapentin (NEURONTIN) 600 MG tablet Take 1 tablet (600 mg total) by mouth 3 (three) times daily. 90 tablet 5  . glipiZIDE (GLUCOTROL) 10 MG tablet TAKE ONE TABLET BY MOUTH TWICE DAILY BEFORE A MEAL 60 tablet 3  . lisinopril (PRINIVIL,ZESTRIL) 2.5 MG tablet Take 1 tablet (2.5 mg total) by mouth daily. 90 tablet 3  . metFORMIN (GLUCOPHAGE) 1000 MG tablet Take 1 tablet (1,000 mg total) by mouth 2 (two) times daily. 60 tablet 3  . metoprolol tartrate (LOPRESSOR) 25 MG tablet Take 1 tablet (25 mg total) by  mouth 2 (two) times daily. 60 tablet 1  . nitroGLYCERIN (NITROSTAT) 0.4 MG SL tablet Place 1 tablet (0.4 mg total) under the tongue every 5 (five) minutes as needed for chest pain. X 3 doses 25 tablet prn  . oxyCODONE (OXY IR/ROXICODONE) 5 MG immediate release tablet Take 1-2 tablets (5-10 mg total) by mouth every 3 (three) hours as needed for severe pain. 60 tablet 0  . pantoprazole (PROTONIX) 40 MG tablet Take 1 tablet (40 mg total) by mouth daily. 30 tablet 11   No current facility-administered medications for this visit.     Physical Exam: BP 96/88   Pulse 66   Resp 20   Ht 5\' 9"  (1.753 m)   Wt 220 lb (99.8 kg)   SpO2 99% Comment: RA  BMI 32.49 kg/m   He looks well, noticeably thinner. Lung exam is clear. Cardiac exam shows a regular rate and rhythm with normal heart sounds. Chest incision is healing well and sternum is stable. The leg incisions are healing well and there is no peripheral edema.   Diagnostic Tests:  CLINICAL DATA:  Status post CABG 5 weeks ago. No current chest complaints.  EXAM: CHEST  2 VIEW  COMPARISON:  Portable chest x-ray of April 25, 2016  FINDINGS: The lungs are adequately inflated and clear. The heart and pulmonary vascularity are normal. There is calcification in  the wall of the aortic arch. The sternal wires are intact. The retrosternal soft tissues appear normal. There is no pleural effusion. The thoracic spine exhibits no acute abnormality.  IMPRESSION: There is no pneumonia, CHF, nor other acute cardiopulmonary abnormality.   Electronically Signed   By: David  Martinique M.D.   On: 05/30/2016 09:50  Impression:  Overall I think he is doing well. I encouraged him to continue walking. He is planning to participate in cardiac rehab. I told him he could drive his car but should not lift anything heavier than 10 lbs for three months postop. I encouraged him to continue his lifestyle changes that should lead to a much better long  term prognosis. It is unclear why he has this left thigh pain but it sounds like neuralgia. His cath was done on the right side as was his vein harvest. This seems like unusual pain for diabetic neuropathy with no pain in his lower legs or feet. He will have to wait for the lumbar spine MRI to get done to assess for spinal stenosis or disc disease.   Plan:  He will continue to follow up with Dr. Dennard Schaumann and Dr. Burt Knack and will contact me if he has any problems with his incisions.   Gaye Pollack, MD [facility] [phone]

## 2016-06-01 ENCOUNTER — Telehealth: Payer: Self-pay | Admitting: *Deleted

## 2016-06-01 ENCOUNTER — Other Ambulatory Visit: Payer: Self-pay | Admitting: *Deleted

## 2016-06-01 DIAGNOSIS — M79604 Pain in right leg: Secondary | ICD-10-CM

## 2016-06-01 DIAGNOSIS — M79605 Pain in left leg: Principal | ICD-10-CM

## 2016-06-01 DIAGNOSIS — E1140 Type 2 diabetes mellitus with diabetic neuropathy, unspecified: Secondary | ICD-10-CM

## 2016-06-01 DIAGNOSIS — G629 Polyneuropathy, unspecified: Secondary | ICD-10-CM

## 2016-06-01 DIAGNOSIS — M5416 Radiculopathy, lumbar region: Secondary | ICD-10-CM

## 2016-06-01 NOTE — Telephone Encounter (Signed)
Informed patient PA for MRI has been denied and our next step would be a nerve conduction study. Pt is fine with that and requested to go to [facility] and see Dr. Felecia Shelling if possible (wife's dr). I will put in new referral and set that up and call pt back.

## 2016-06-04 NOTE — Addendum Note (Signed)
Addendum  created 06/04/16 1417 by Edward Trevino, MD   Sign clinical note    

## 2016-06-05 ENCOUNTER — Telehealth: Payer: Self-pay | Admitting: Family Medicine

## 2016-06-05 DIAGNOSIS — G629 Polyneuropathy, unspecified: Secondary | ICD-10-CM

## 2016-06-05 NOTE — Telephone Encounter (Signed)
Pt aware per Abbi and Neuro will call pt to set up appt.

## 2016-06-05 NOTE — Telephone Encounter (Signed)
I received this from neurology Dr. Jaynee Eagles on review of his chart is recommended that he be seen by neurology is seeing psych very complex case before just doing the nerve conduction studies. As my  partner is out of town and I will go ahead and proceed with neurology referral so that he can be evaluated by them and then further testing done at their discretion.  Please let pt know

## 2016-06-08 ENCOUNTER — Ambulatory Visit (INDEPENDENT_AMBULATORY_CARE_PROVIDER_SITE_OTHER): Payer: BLUE CROSS/BLUE SHIELD | Admitting: Family Medicine

## 2016-06-08 ENCOUNTER — Encounter: Payer: Self-pay | Admitting: Family Medicine

## 2016-06-08 VITALS — BP 98/64 | HR 80 | Temp 98.2°F | Resp 20 | Ht 69.0 in | Wt 221.0 lb

## 2016-06-08 DIAGNOSIS — M25561 Pain in right knee: Secondary | ICD-10-CM | POA: Diagnosis not present

## 2016-06-08 NOTE — Progress Notes (Signed)
Subjective:    Patient ID: Juan Ray, male    DOB: 11-21-1960, 56 y.o.   MRN: [id number]  HPI Report severe pain in his right knee for 5 days. Began without injury. Pain is located posteriorly and on the medial and lateral joint lines. It is primarily worse posterior lateral. He also has a mild effusion. He denies any falls or injuries. He has no laxity to varus or valgus stress. He has a negative anterior and posterior drawer sign. He does have pain with Apley grind. He also has tenderness to palpation over the lateral joint line. However the patient states that the pain is 50-60% better this morning and is hesitant to do any intervention at the present time since he is getting better Past Medical History:  Diagnosis Date  . Anxiety   . Arthritis   . CAD (coronary artery disease) 04/07/2012   Inferoposterior STEMI s/p DES-mid LCx  . Diabetes mellitus   . Diabetic neuropathy ([place])   . GERD (gastroesophageal reflux disease)   . High cholesterol   . Hypertension   . Myocardial infarction ([place])   . Obesity   . Pancreatitis    a. mild by CT 06/2011  . Peptic ulcer disease    a. 06/2009 EGD: multiple gastric and duodenal ulcers.   Past Surgical History:  Procedure Laterality Date  . CORONARY ANGIOPLASTY WITH STENT PLACEMENT  04/07/2012   70% prox LAD, 70-80% ostial diagonal, 70% mid-distal LAD, 80% prox OM1, mid LCx totally occluded just distal to OM1 s/p DES, occluded RCA; severe inferior HK, LVEF 45%  . CORONARY ARTERY BYPASS GRAFT N/A 04/23/2016   Procedure: CORONARY ARTERY BYPASS GRAFTING (CABG) x 4;  Surgeon: Gaye Pollack, MD;  Location: [facility];  Service: Open Heart Surgery;  Laterality: N/A;  . ENDOVEIN HARVEST OF GREATER SAPHENOUS VEIN Right 04/23/2016   Procedure: ENDOVEIN HARVEST OF GREATER SAPHENOUS VEIN;  Surgeon: Gaye Pollack, MD;  Location: [place];  Service: Open Heart Surgery;  Laterality: Right;  . ESOPHAGOGASTRODUODENOSCOPY  06/18/2011   NL ESOPHAGUS/UlcerATED LESIONS/  SUPERFICIAL Ulcers  . INTRAVASCULAR PRESSURE WIRE/FFR STUDY N/A 04/13/2016   Procedure: Intravascular Pressure Wire/FFR Study;  Surgeon: Nelva Bush, MD;  Location: [place] [facility];  Service: Cardiovascular;  Laterality: N/A;  . LEFT HEART CATH AND CORONARY ANGIOGRAPHY N/A 04/13/2016   Procedure: Left Heart Cath and Coronary Angiography;  Surgeon: Nelva Bush, MD;  Location: [place] [facility];  Service: Cardiovascular;  Laterality: N/A;  . LEFT HEART CATHETERIZATION WITH CORONARY ANGIOGRAM N/A 04/07/2012   Procedure: LEFT HEART CATHETERIZATION WITH CORONARY ANGIOGRAM;  Surgeon: Sherren Mocha, MD;  Location: [facility] C[facility] LAB;  Service: Cardiovascular;  Laterality: N/A;  . PERCUTANEOUS CORONARY STENT INTERVENTION (PCI-S)  04/07/2012   Procedure: PERCUTANEOUS CORONARY STENT INTERVENTION (PCI-S);  Surgeon: Sherren Mocha, MD;  Location: [facility] C[facility] LAB;  Service: Cardiovascular;;  . TEE WITHOUT CARDIOVERSION N/A 04/23/2016   Procedure: TRANSESOPHAGEAL ECHOCARDIOGRAM (TEE);  Surgeon: Gaye Pollack, MD;  Location: [place];  Service: Open Heart Surgery;  Laterality: N/A;   Current Outpatient Prescriptions on File Prior to Visit  Medication Sig Dispense Refill  . acetaminophen (TYLENOL) 325 MG tablet Take 2 tablets (650 mg total) by mouth every 6 (six) hours as needed for mild pain.    Marland Kitchen aspirin EC 81 MG EC tablet Take 1 tablet (81 mg total) by mouth daily.    Marland Kitchen atorvastatin (LIPITOR) 40 MG tablet Take 1 tablet (40 mg total) by mouth daily. (Patient taking differently:  Take 40 mg by mouth at bedtime. ) 30 tablet 5  . docusate sodium (COLACE) 100 MG capsule Take 200 mg by mouth at bedtime.    . empagliflozin (JARDIANCE) 25 MG TABS tablet Take 25 mg by mouth daily. 30 tablet 11  . escitalopram (LEXAPRO) 10 MG tablet Take 10 mg by mouth at bedtime.   0  . gabapentin (NEURONTIN) 600 MG tablet Take 1 tablet (600 mg total) by mouth 3 (three) times daily. 90 tablet 5  . glipiZIDE (GLUCOTROL) 10 MG tablet TAKE ONE  TABLET BY MOUTH TWICE DAILY BEFORE A MEAL 60 tablet 3  . lisinopril (PRINIVIL,ZESTRIL) 2.5 MG tablet Take 1 tablet (2.5 mg total) by mouth daily. 90 tablet 3  . metFORMIN (GLUCOPHAGE) 1000 MG tablet Take 1 tablet (1,000 mg total) by mouth 2 (two) times daily. 60 tablet 3  . metoprolol tartrate (LOPRESSOR) 25 MG tablet Take 1 tablet (25 mg total) by mouth 2 (two) times daily. 60 tablet 1  . nitroGLYCERIN (NITROSTAT) 0.4 MG SL tablet Place 1 tablet (0.4 mg total) under the tongue every 5 (five) minutes as needed for chest pain. X 3 doses 25 tablet prn  . oxyCODONE (OXY IR/ROXICODONE) 5 MG immediate release tablet Take 1-2 tablets (5-10 mg total) by mouth every 3 (three) hours as needed for severe pain. 60 tablet 0  . pantoprazole (PROTONIX) 40 MG tablet Take 1 tablet (40 mg total) by mouth daily. 30 tablet 11   No current facility-administered medications on file prior to visit.    Allergies  Allergen Reactions  . No Known Allergies    Social History   Social History  . Marital status: Married    Spouse name: N/A  . Number of children: N/A  . Years of education: N/A   Occupational History  . truck driver    Social History Main Topics  . Smoking status: Former Smoker    Years: 20.00    Quit date: 2000  . Smokeless tobacco: Former Systems developer    Quit date: 01/01/1998  . Alcohol use No  . Drug use: No  . Sexual activity: Yes   Other Topics Concern  . Not on file   Social History Narrative   Lives in [place], with his wife and 43 yr old son.      Review of Systems     Objective:   Physical Exam  Cardiovascular: Normal rate, regular rhythm and normal heart sounds.   Pulmonary/Chest: Effort normal and breath sounds normal.  Musculoskeletal:       Right knee: He exhibits effusion and bony tenderness. He exhibits normal range of motion, no swelling, no LCL laxity and no MCL laxity. Tenderness found. Medial joint line and lateral joint line tenderness noted.  Vitals  reviewed.         Assessment & Plan:  Acute pain of right knee  I suspect knee pain secondary to osteoarthritis versus a possible meniscal tear. However symptoms are already 60% better just with tincture of time. Recommended we monitor the patient over the weekend to see if the pain continues to improve. If no better next week, he can return for cortisone injection. Regarding his diabetes, he's been experiencing hypoglycemic episodes. When he checks his sugar it is seldom over 100. It has been as low as 50. I have recommended temporarily discontinuing glipizide but continuing metformin and jardiance and having a patient keep a record of his fasting and two-hour postprandial sugars. We may need to add glipizide back at  a reduced dose but only if indicated by sugars greater than 130 fasting for greater than 160 postprandial

## 2016-06-09 ENCOUNTER — Other Ambulatory Visit: Payer: Self-pay | Admitting: Family Medicine

## 2016-06-19 ENCOUNTER — Ambulatory Visit (INDEPENDENT_AMBULATORY_CARE_PROVIDER_SITE_OTHER): Payer: BLUE CROSS/BLUE SHIELD | Admitting: Neurology

## 2016-06-19 ENCOUNTER — Encounter: Payer: Self-pay | Admitting: Neurology

## 2016-06-19 VITALS — BP 102/72 | HR 93 | Ht 69.0 in | Wt 220.0 lb

## 2016-06-19 DIAGNOSIS — M79651 Pain in right thigh: Secondary | ICD-10-CM | POA: Diagnosis not present

## 2016-06-19 DIAGNOSIS — M79652 Pain in left thigh: Secondary | ICD-10-CM | POA: Diagnosis not present

## 2016-06-19 MED ORDER — GABAPENTIN 600 MG PO TABS: 900.0000 mg | ORAL_TABLET | Freq: Three times a day (TID) | ORAL | 11 refills | Status: DC

## 2016-06-19 NOTE — Patient Instructions (Signed)
Remember to drink plenty of fluid, eat healthy meals and do not skip any meals. Try to eat protein with a every meal and eat a healthy snack such as fruit or nuts in between meals. Try to keep a regular sleep-wake schedule and try to exercise daily, particularly in the form of walking, 20-30 minutes a day, if you can.   As far as your medications are concerned, I would like to suggest: Increase gabapentin as needed 900mg  three times a day  As far as diagnostic testing: emg/ncs,   I would like to see you back tomorrow, sooner if we need to. Please call us with any interim questions, concerns, problems, updates or refill requests.   Our phone number is [phone]. We also have an after hours call service for urgent matters and there is a physician on-call for urgent questions. For any emergencies you know to call 911 or go to the nearest emergency room

## 2016-06-19 NOTE — Progress Notes (Addendum)
[facility]    Provider:  Dr Jaynee Eagles Referring Provider: Susy Frizzle, MD Primary Care Physician:  Susy Frizzle, MD  CC:  Thigh pain and paresthesias, numbness and tingling.   HPI:  Juan Ray is a 56 y.o. male here as a referral from Dr. Dennard Schaumann for neuropathy.  He has a past medical history of myocardial infarction, pancreatitis, obesity, hypertension, high cholesterol, GERD, diabetic neuropathy, diabetes, coronary artery disease s/p cabg x 4, anxiety. The middle of April of this year had a heart operation, he had neuropathy in his feet and diagnosed with diabetic neuropathy. After the operation both legs were totally numb and then 5 days afterwards the legs started feeling like a blowtorch and radiated all the way to the hips. Over 5 days things got worse as far as sensation goes. No weakness. Numbness and tingling anterolateral thighs. He is pending MRI Lumbar spine. The pain in the feet did not get worse, the new symptoms are the pain in the thighs. The thigh pain is intense. Gets worse with walking. But it hurts all the time. Mostly the anterior thigh, numbness, burning and tingling. No weakness, pue sensory. Right now the pain is 4-5/10. The neuropath in the feet never changes, his complaint is only pin in the thighs. He has back pain. Lost 30 pounds and glucose has been running low.   Reviewed notes, labs and imaging from outside physicians, which showed:  Patient reports of burning and pins and needles in both feet. Feels like his feet are on fire. This keeps him awake at night. The pain can be severe. He has a difficult time sleeping and a history of uncontrolled diabetes mellitus. Second issue is itching all over his body. Patient was recently diagnosed with scabies. He is on a second treatment. He was treated with gabapentin 300 mg every 8 hours. The itching was residual scabies infestation.   On follow-up exam in February patient's that the gabapentin 300  mg 3 times a day is 40% better. The pain in his legs is improved but still keeps awake at night and constant pain throughout the day. They switched gabapentin to Lyrica 3 times a day. He was also given Percocet for the pain in his feet.  On follow-up exam in May he said the neuropathic pain in both lower extremities was worsening. He described pain in his anterior lateral left thigh that felt like a blow torch bringing his skin. The pain is an 8 out of 10. Radiates from his upper thigh to his knee. Also similar burning searing pain in his right upper thigh. The skin on the anterior portion of both eyes is thumb to the touch. He also reports pins and needles pain in both feet however he denies numbness. He also talks about electrical pain that increases through both legs. At this appointment he was on gabapentin 600 mg 3 times a day with minimal relief. He cannot tolerate Lyrica.   Vitamin B12 392.  Hemoglobin A1c 2 months ago: 7.8, 4 months ago 10.2, 2 years ago 8.2, 5 years ago 9.8  Review of Systems: Patient complains of symptoms per HPI as well as the following symptoms: Fatigue, hearing loss, joint pain, aching muscles, restless legs, decreased energy. Pertinent negatives and positives per HPI. All others negative.   Social History   Social History  . Marital status: Married    Spouse name: N/A  . Number of children: 2  . Years of education: N/A   Occupational History  .  truck driver    Social History Main Topics  . Smoking status: Former Smoker    Years: 20.00    Quit date: 2000  . Smokeless tobacco: Former Systems developer    Quit date: 01/01/1998  . Alcohol use No  . Drug use: No  . Sexual activity: Yes   Other Topics Concern  . Not on file   Social History Narrative   Lives in [place], with his wife and 2 children       Family History  Problem Relation Age of Onset  . Heart attack Father        MI x 2 in late 50's, currently 35's  . CVA Sister        late 20s  . CAD  Maternal Uncle        MI at 82    Past Medical History:  Diagnosis Date  . Anxiety   . Arthritis   . CAD (coronary artery disease) 04/07/2012   Inferoposterior STEMI s/p DES-mid LCx  . Diabetes mellitus   . Diabetic neuropathy ([facility])   . GERD (gastroesophageal reflux disease)   . High cholesterol   . Hypertension   . Myocardial infarction ([place])   . Obesity   . Pancreatitis    a. mild by CT 06/2011  . Peptic ulcer disease    a. 06/2009 EGD: multiple gastric and duodenal ulcers.    Past Surgical History:  Procedure Laterality Date  . CORONARY ANGIOPLASTY WITH STENT PLACEMENT  04/07/2012   70% prox LAD, 70-80% ostial diagonal, 70% mid-distal LAD, 80% prox OM1, mid LCx totally occluded just distal to OM1 s/p DES, occluded RCA; severe inferior HK, LVEF 45%  . CORONARY ARTERY BYPASS GRAFT N/A 04/23/2016   Procedure: CORONARY ARTERY BYPASS GRAFTING (CABG) x 4;  Surgeon: Gaye Pollack, MD;  Location: [place] [facility];  Service: Open Heart Surgery;  Laterality: N/A;  . ENDOVEIN HARVEST OF GREATER SAPHENOUS VEIN Right 04/23/2016   Procedure: ENDOVEIN HARVEST OF GREATER SAPHENOUS VEIN;  Surgeon: Gaye Pollack, MD;  Location: [place];  Service: Open Heart Surgery;  Laterality: Right;  . ESOPHAGOGASTRODUODENOSCOPY  06/18/2011   NL ESOPHAGUS/UlcerATED LESIONS/ SUPERFICIAL Ulcers  . INTRAVASCULAR PRESSURE WIRE/FFR STUDY N/A 04/13/2016   Procedure: Intravascular Pressure Wire/FFR Study;  Surgeon: Nelva Bush, MD;  Location: [place] [facility];  Service: Cardiovascular;  Laterality: N/A;  . LEFT HEART CATH AND CORONARY ANGIOGRAPHY N/A 04/13/2016   Procedure: Left Heart Cath and Coronary Angiography;  Surgeon: Nelva Bush, MD;  Location: [place] [facility];  Service: Cardiovascular;  Laterality: N/A;  . LEFT HEART CATHETERIZATION WITH CORONARY ANGIOGRAM N/A 04/07/2012   Procedure: LEFT HEART CATHETERIZATION WITH CORONARY ANGIOGRAM;  Surgeon: Sherren Mocha, MD;  Location: [facility] CATH LAB;  Service: Cardiovascular;   Laterality: N/A;  . PERCUTANEOUS CORONARY STENT INTERVENTION (PCI-S)  04/07/2012   Procedure: PERCUTANEOUS CORONARY STENT INTERVENTION (PCI-S);  Surgeon: Sherren Mocha, MD;  Location: [facility] CATH LAB;  Service: Cardiovascular;;  . TEE WITHOUT CARDIOVERSION N/A 04/23/2016   Procedure: TRANSESOPHAGEAL ECHOCARDIOGRAM (TEE);  Surgeon: Gaye Pollack, MD;  Location: [place];  Service: Open Heart Surgery;  Laterality: N/A;    Current Outpatient Prescriptions  Medication Sig Dispense Refill  . aspirin EC 81 MG EC tablet Take 1 tablet (81 mg total) by mouth daily.    Marland Kitchen atorvastatin (LIPITOR) 40 MG tablet Take 1 tablet (40 mg total) by mouth daily. (Patient taking differently: Take 40 mg by mouth at bedtime. ) 30 tablet 5  . docusate  sodium (COLACE) 100 MG capsule Take 200 mg by mouth at bedtime.    . empagliflozin (JARDIANCE) 25 MG TABS tablet Take 25 mg by mouth daily. 30 tablet 11  . escitalopram (LEXAPRO) 10 MG tablet Take 10 mg by mouth at bedtime.   0  . gabapentin (NEURONTIN) 600 MG tablet Take 1.5 tablets (900 mg total) by mouth 3 (three) times daily. 120 tablet 11  . lisinopril (PRINIVIL,ZESTRIL) 2.5 MG tablet Take 1 tablet (2.5 mg total) by mouth daily. 90 tablet 3  . metFORMIN (GLUCOPHAGE) 1000 MG tablet TAKE ONE TABLET BY MOUTH TWICE DAILY 60 tablet 3  . metoprolol tartrate (LOPRESSOR) 25 MG tablet Take 1 tablet (25 mg total) by mouth 2 (two) times daily. 60 tablet 1  . nitroGLYCERIN (NITROSTAT) 0.4 MG SL tablet Place 1 tablet (0.4 mg total) under the tongue every 5 (five) minutes as needed for chest pain. X 3 doses 25 tablet prn  . oxyCODONE (OXY IR/ROXICODONE) 5 MG immediate release tablet Take 1-2 tablets (5-10 mg total) by mouth every 3 (three) hours as needed for severe pain. 60 tablet 0  . pantoprazole (PROTONIX) 40 MG tablet Take 1 tablet (40 mg total) by mouth daily. 30 tablet 11   No current facility-administered medications for this visit.     Allergies as of 06/19/2016 - Review  Complete 06/19/2016  Allergen Reaction Noted  . No known allergies  04/20/2016    Vitals: BP 102/72   Pulse 93   Ht 5\' 9"  (1.753 m)   Wt 220 lb (99.8 kg)   BMI 32.49 kg/m  Last Weight:  Wt Readings from Last 1 Encounters:  06/19/16 220 lb (99.8 kg)   Last Height:   Ht Readings from Last 1 Encounters:  06/19/16 5\' 9"  (1.753 m)    Physical exam: Exam: Gen: NAD, conversant, well nourised, obese, well groomed                     CV: RRR, no MRG. No Carotid Bruits. No peripheral edema, warm, nontender Eyes: Conjunctivae clear without exudates or hemorrhage  Neuro: Detailed Neurologic Exam  Speech:    Speech is normal; fluent and spontaneous with normal comprehension.  Cognition:    The patient is oriented to person, place, and time;     recent and remote memory intact;     language fluent;     normal attention, concentration,     fund of knowledge Cranial Nerves:    The pupils are equal, round, and reactive to light. Attempted funduscopic exam could not visualize due to small pupils. Visual fields are full to finger confrontation. Extraocular movements are intact. Trigeminal sensation is intact and the muscles of mastication are normal. The face is symmetric. The palate elevates in the midline. Hearing intact. Voice is normal. Shoulder shrug is normal. The tongue has normal motion without fasciculations.   Coordination:    Normal finger to nose and heel to shin.   Gait:    Heel-toe and tandem gait are normal.   Motor Observation:    No asymmetry, no atrophy, and no involuntary movements noted. Tone:    Normal muscle tone.    Posture:    Posture is normal. normal erect    Strength: 4+/5 bilateral hip flexion. Otherwise strength is V/V in the upper and lower limbs.      Sensation: decreased sensation to pinprick distally in feet. Decreased sensation to pinprick antero- lateral thighs     Reflex Exam:  DTR's:  Trace AJs otherwise deep tendon reflexes in the  upper and lower extremities are normal bilaterally.   Toes:    The toes are downgoing bilaterally.   Clonus:    Clonus is absent.      Assessment/Plan:  This is a 56 year old patient  56 y.o. male here as a referral from Dr. Dennard Schaumann for neuropathy.  He has a past medical history of myocardial infarction, pancreatitis, obesity, hypertension, high cholesterol, GERD, diabetic neuropathy, uncontrolled diabetes, coronary artery disease s/p cabg x 4, anxiety. Patient was admitted in April for coronary bypass grafting x 4. Patient reports that in the days following surgery his legs were numb and weak and he is subsequently developed significant paresthesias and numbness in the anteriolateral  thighs  DDX: positional nerve damage from immobility, meralgia paresthetica, lumbar radiculopathy, plexopathy, diabetic amyotrophy.   Emg/NCS tomorrow morning MRI lumbar spine pending EMG findings If no etiology then image the pelvis for compressive masses Otherwise consider lateral femoral nerve blocks Increase gabapentin to 3 pills up to 3x a day EMG/NCS of the bilateral lowers including lateral femoral cutaneous if possible and femoral motor and sensory. Cc: Dr. Lowry Bowl, [facility] [address] [place], [place] [address]  Phone [phone] Fax [phone]

## 2016-06-20 ENCOUNTER — Ambulatory Visit (INDEPENDENT_AMBULATORY_CARE_PROVIDER_SITE_OTHER): Payer: Self-pay | Admitting: Neurology

## 2016-06-20 ENCOUNTER — Ambulatory Visit (INDEPENDENT_AMBULATORY_CARE_PROVIDER_SITE_OTHER): Payer: BLUE CROSS/BLUE SHIELD | Admitting: Neurology

## 2016-06-20 ENCOUNTER — Telehealth: Payer: Self-pay | Admitting: Neurology

## 2016-06-20 DIAGNOSIS — M79651 Pain in right thigh: Secondary | ICD-10-CM

## 2016-06-20 DIAGNOSIS — M5416 Radiculopathy, lumbar region: Secondary | ICD-10-CM

## 2016-06-20 DIAGNOSIS — R29898 Other symptoms and signs involving the musculoskeletal system: Secondary | ICD-10-CM

## 2016-06-20 DIAGNOSIS — M6289 Other specified disorders of muscle: Secondary | ICD-10-CM

## 2016-06-20 DIAGNOSIS — M79652 Pain in left thigh: Secondary | ICD-10-CM

## 2016-06-20 DIAGNOSIS — Z0289 Encounter for other administrative examinations: Secondary | ICD-10-CM

## 2016-06-20 DIAGNOSIS — M79604 Pain in right leg: Secondary | ICD-10-CM

## 2016-06-20 DIAGNOSIS — M79605 Pain in left leg: Principal | ICD-10-CM

## 2016-06-20 DIAGNOSIS — R339 Retention of urine, unspecified: Secondary | ICD-10-CM

## 2016-06-20 NOTE — Progress Notes (Signed)
See procedure note.

## 2016-06-20 NOTE — Telephone Encounter (Signed)
Patient is scheduled to have his MRI at [facility] on Friday 06/22/16 at 6:00 pm arrive at 5:30 pm.. The BCBS authorization is [id number] (exp. 06/20/16 to 07/19/16). Patient is aware of time and date.

## 2016-06-20 NOTE — Progress Notes (Signed)
Full Name: Juan Ray Gender: Male MRN #: [id number] Date of Birth: [birth year]    Visit Date: 06/20/16 08:23 Age: 56 Years 68 Months Old Examining Physician: Sarina Ill, MD  Referring Physician: Jaynee Eagles, MD  History:  This is a 56 year old patient  56 y.o. male here as a referral from Dr. Dennard Schaumann for neuropathy.  He has a past medical history of myocardial infarction, pancreatitis, obesity, hypertension, high cholesterol, GERD, diabetic neuropathy, uncontrolled diabetes, coronary artery disease s/p cabg x 4, anxiety. Patient was admitted in April for coronary bypass grafting x 4. Patient reports that in the days following surgery his legs were numb and weak and he is subsequently developed significant paresthesias and numbness in the anteriolateral bilateral thighs. DDX: positional nerve damage from immobility, meralgia paresthetica, lumbar radiculopathy, plexopathy, diabetic amyotrophy.  Summary: EMG nerve conduction studies were performed in the bilateral lower extremities.   The left peroneal motor nerve showed decreased conduction velocity (fibular head to ankle, 10m/s, N>44) and decreased conduction velocity (popliteal fossa to fibular head, 54m/s, N>44). The right peroneal motor nerve showed reduced amplitude (1.3 mV, N>2) and decreased conduction velocity (fibular head to ankle, 79m/s, N>44) and decreased conduction velocity (popliteal fossa to fibular head, 74m/s, N>44). The left Tibial motor nerve showed decreased conduction velocity (popliteal Fossa to ankle, 59m/s, N>41).  The right Tibial motor nerve showed prolonged distal onset latency (5.9 ms, N<5.8), reduced amplitude (64mV, N>4), decreased conduction velocity (popliteal Fossa to ankle, 103m/s, N>41).  The left femoral motor nerve showed reduced amplitude (2.15mV, N>3). The left Sural sensory nerve showed delayed distal peak latency (5.1 ms, N< 4.4) and reduced amplitude (5 V,N> 6). The right Sural sensory nerve showed delayed  distal peak latency (4.7 ms, N< 4.4) and reduced amplitude (2 V,N> 6). The left and right superficial peroneal sensory nerves were absent. The left and right lateral femoral cutaneous nerves were absent. The left tibial F-wave showed delayed latency (62.1 ms,N< 56). All remaining nerves (as detailed in the following tables) were normal.  Conclusion:   EMG showed an acute/ongoing denervation in the lower paraspinal muscles bilaterally at multiple levels consistent with lumbosacral radiculopathy. There were no other changes on EMG to further localize.  There is also electrophysiologic evidence on nerve conduction studies for a concomitant sensorimotor axonal polyneuropathy.  Recommend MRI of the lumbar spine for further evaluation.   Sarina Ill, M.D.  [facility] [place], [place] [address] Tel: [phone] Fax: [phone]        [facility]    Nerve / Sites Muscle Latency Ref. Amplitude Ref. Rel Amp Segments Distance Velocity Ref. Area    ms ms mV mV %  cm m/s m/s mVms  L Peroneal - EDB     Ankle EDB 6.1 ?6.5 2.2 ?2.0 100 Ankle - EDB 9   9.0     Fib head EDB 14.7  2.0  90.1 Fib head - Ankle 31 36 ?44 9.2     Pop fossa EDB 16.8  2.0  99.3 Pop fossa - Fib head 9 43 ?44 9.4         Pop fossa - Ankle      R Peroneal - EDB     Ankle EDB 6.3 ?6.5 1.3 ?2.0 100 Ankle - EDB 9   4.2     Fib head EDB 15.1  1.2  89.3 Fib head - Ankle 31 35 ?44 3.8     Pop fossa EDB 17.3  1.1  98.8 Pop fossa - Fib head 9 39 ?44 3.8         Pop fossa - Ankle      L Tibial - AH     Ankle AH 4.3 ?5.8 5.1 ?4.0 100 Ankle - AH 9   14.6     Pop fossa AH 15.6  5.2  102 Pop fossa - Ankle 39 35 ?41 13.3  R Tibial - AH     Ankle AH 5.9 ?5.8 2.0 ?4.0 100 Ankle - AH 9   5.7     Pop fossa AH 17.1  1.7  83.4 Pop fossa - Ankle 39 35 ?41 5.4  L Femoral - Vastus Med     B. Ing Lig Vastus Med 8.0  2.7  100 B. Ing Lig - Vastus Med    25.8  R Femoral - Vastus Med     B. Ing Lig Vastus Med 7.9  4.6  100 B.  Ing Lig - Vastus Med    46.3                 SNC    Nerve / Sites Rec. Site Peak Lat Ref.  Amp Ref. Segments Distance    ms ms V V  cm  L Sural - Ankle (Calf)     Calf Ankle 5.1 ?4.4 5 ?6 Calf - Ankle 14  R Sural - Ankle (Calf)     Calf Ankle 4.7 ?4.4 2 ?6 Calf - Ankle 14  L Superficial peroneal - Ankle     Lat leg Ankle NR ?4.4 NR ?6 Lat leg - Ankle 14  R Superficial peroneal - Ankle     Lat leg Ankle NR ?4.4 NR ?6 Lat leg - Ankle 14  R Lateral femoral cutaneous - Thigh (Inguinal ligament)     A. Ing ligament Thigh NR  NR  A. Ing ligament - Thigh   L Lateral femoral cutaneous - Thigh (Inguinal ligament)     A. Ing ligament Thigh NR  NR  A. Ing ligament - Thigh                  F  Wave    Nerve F Lat Ref.   ms ms  L Tibial - AH 62.1 ?56.0  R Tibial - AH 62.6 ?56.0         H Reflex    Nerve H Lat Lat Hmax   ms ms   Left Right Ref. Left Right Ref.  Tibial - [place] 38.6 40.3 ?35.0 49.4 46.5 ?35.0         EMG full       EMG Summary Table    Spontaneous MUAP Recruitment  Muscle IA Fib PSW Fasc Other Amp Dur. Poly Pattern  R. Iliopsoas Normal None None None _______ Normal Normal Normal Normal  R. Adductor longus Normal None None None _______ Normal Normal Normal Normal  R. Vastus medialis Normal None None None _______ Normal Normal Normal Normal  R. Tibialis anterior Normal None None None _______ Normal Normal Normal Normal  R. Gastrocnemius (Medial head) Normal None None None _______ Normal Normal Normal Normal  R. Biceps femoris (long head) Normal None None None _______ Normal Normal Normal Normal  R. Gluteus maximus Normal None None None _______ Normal Normal Normal Normal  R. Gluteus medius Normal None None None _______ Normal Normal Normal Normal  R. Lumbar paraspinals (low) Normal +3 +4 None _______ Normal Normal Normal Normal  L. Lumbar paraspinals (low) Normal +2 +  3 None _______ Normal Normal Normal Normal

## 2016-06-22 ENCOUNTER — Ambulatory Visit (HOSPITAL_COMMUNITY)
Admission: RE | Admit: 2016-06-22 | Discharge: 2016-06-22 | Disposition: A | Discharge status: Home / Self Care | Payer: BLUE CROSS/BLUE SHIELD | Source: Ambulatory Visit | Attending: Neurology | Admitting: Neurology

## 2016-06-22 DIAGNOSIS — R29898 Other symptoms and signs involving the musculoskeletal system: Secondary | ICD-10-CM | POA: Diagnosis not present

## 2016-06-22 DIAGNOSIS — M79651 Pain in right thigh: Secondary | ICD-10-CM | POA: Insufficient documentation

## 2016-06-22 DIAGNOSIS — M5416 Radiculopathy, lumbar region: Secondary | ICD-10-CM | POA: Diagnosis not present

## 2016-06-22 DIAGNOSIS — R339 Retention of urine, unspecified: Secondary | ICD-10-CM | POA: Insufficient documentation

## 2016-06-22 DIAGNOSIS — M79652 Pain in left thigh: Secondary | ICD-10-CM | POA: Insufficient documentation

## 2016-06-22 DIAGNOSIS — M545 Low back pain: Secondary | ICD-10-CM | POA: Diagnosis not present

## 2016-06-22 NOTE — Progress Notes (Signed)
NCS prove lumbosacral radiculopathy as we suspected and Neurology has recommended MRI of lumbar spine.  We originally ordered this but insurance denied.  Please reorder MRI of Lumbar spine due to lumbosacral radiculopathy and sensorimotor axonal polyneuropathy.

## 2016-06-26 ENCOUNTER — Other Ambulatory Visit: Payer: Self-pay | Admitting: Family Medicine

## 2016-06-26 ENCOUNTER — Encounter: Payer: Self-pay | Admitting: Family Medicine

## 2016-06-26 DIAGNOSIS — M5417 Radiculopathy, lumbosacral region: Secondary | ICD-10-CM

## 2016-06-26 NOTE — Progress Notes (Signed)
MRI has been done by neuro. On 06/22/16

## 2016-06-27 ENCOUNTER — Other Ambulatory Visit: Payer: Self-pay | Admitting: Physician Assistant

## 2016-06-27 DIAGNOSIS — I10 Essential (primary) hypertension: Secondary | ICD-10-CM

## 2016-07-03 ENCOUNTER — Ambulatory Visit: Payer: BLUE CROSS/BLUE SHIELD | Admitting: Family Medicine

## 2016-07-05 MED ORDER — OXYCODONE HCL 5 MG PO TABS: 5.0000 mg | ORAL_TABLET | ORAL | 0 refills | Status: DC | PRN

## 2016-07-05 NOTE — Addendum Note (Signed)
Addended by: Shary Decamp B on: 07/05/2016 09:38 AM   Modules accepted: Orders

## 2016-07-06 ENCOUNTER — Encounter: Payer: Self-pay | Admitting: Family Medicine

## 2016-07-06 ENCOUNTER — Ambulatory Visit (INDEPENDENT_AMBULATORY_CARE_PROVIDER_SITE_OTHER): Payer: BLUE CROSS/BLUE SHIELD | Admitting: Family Medicine

## 2016-07-06 VITALS — BP 100/58 | HR 86 | Temp 98.3°F | Resp 18 | Ht 69.0 in | Wt 218.0 lb

## 2016-07-06 DIAGNOSIS — IMO0002 Reserved for concepts with insufficient information to code with codable children: Secondary | ICD-10-CM

## 2016-07-06 DIAGNOSIS — M5417 Radiculopathy, lumbosacral region: Secondary | ICD-10-CM

## 2016-07-06 DIAGNOSIS — D72829 Elevated white blood cell count, unspecified: Secondary | ICD-10-CM

## 2016-07-06 DIAGNOSIS — E1165 Type 2 diabetes mellitus with hyperglycemia: Secondary | ICD-10-CM

## 2016-07-06 DIAGNOSIS — E1142 Type 2 diabetes mellitus with diabetic polyneuropathy: Secondary | ICD-10-CM | POA: Diagnosis not present

## 2016-07-06 NOTE — Progress Notes (Signed)
Subjective:    Patient ID: Juan Ray, male    DOB: 1960/01/18, 56 y.o.   MRN: [id number]  HPI   02/07/16 Patient has 2 problems.  First he reports a burning pins and needles pain in both feet. He states that his feet feel like they're on fire. He keeps him awake at night. The pain can be severe. He has a difficult time sleeping. He has a history of uncontrolled diabetes mellitus. Second issue is itching all over his body. He has been attributing this to the diabetes however his son recently came to visit him and exposed him to scabies. He has been treated once but he continues to itch on his torso and on his trunk. He is clawing at his skin with sticks and a back scratcher as it is itching so.  At that time, my plan was:  I'll treat his diabetic neuropathy in his feet and legs with gabapentin 300 mg by mouth every 8 hours when necessary pain. I cautioned the patient about sedation and dizziness. However I believe the itching in his trunk and on his back may be residual scabies infestation. I recommended trying a repeat course of treatment with Elimite cream. He is to apply this head to toe and rinse off after 8 hours and repeat in 1 week. Also recommended washing all his bed close, etc. to avoid reinfection.  02/21/16 This taking gabapentin 300 mg 3 times a day, the patient states that the pins and needles sensation in his feet is approximately 40% better. The pain in his legs has improved but is still keeps him awake at night and causes him pain throughout the day. He is interested in other options to help manage the pain. He also believes he may have broken a rib on the left side. He felt a pop while pressing against his chest at work. He is nontender to palpation over the rib just below his left nipple. His blood pressure today is also elevated. This is the second time his blood pressure is been elevated. He is on lisinopril 2.5 mg a day.  At that time, my plan was: Patient states that his blood  sugar now down to 150. I'm concerned by his elevated blood pressure. I will increase his lisinopril to 20 mg a day and recheck his blood pressure in one month. I gave the patient the option of increasing gabapentin to 600 mg 3 times a day versus switching gabapentin to Lyrica 100 mg by mouth 3 times a day. He would like to try the Lyrica first. Also gave him Percocet 7.5/325 but I recommended one half tablet every 6 hours as needed for rib pain. I also cautioned him against taking the pain medication with the Lyrica until he sees how his body adjusts to the Lyrica  05/23/16 The neuropathic pain in both lower extremities is worsening. He describes the pain in his anterior, lateral left thigh that feels like a blow torch burning his skin. The pain is 8 on a scale of 10. It radiates from his upper thigh to his knee. He also has similar burning severe searing pain in his right upper thigh. The skin on the anterior portion of both eyes is numb to the touch. He also reports pins and needles pain in both feet however he denies numbness. He reports electrical like pain that races through both legs. Previously I diagnosed him with diabetic neuropathy. He is currently on gabapentin 600 mg 3 times a day with minimal  relief. He could not tolerate Lyrica. He took some oxycodone that he had left over from surgery last night and this eased the pain slightly.  At that time, my plan was: I still believe that the patient is having neuropathic pain however the pain is out of proportion to what I would expect from diabetic neuropathy. Furthermore he is failing gabapentin, has failed Lyrica. Proceed with an MRI of the lumbar spine to evaluate for lumbar radiculopathy as a possible cause such as spinal stenosis. Order nerve conduction test to evaluate for other possible causes of neuropathy.  Also check a B12 level. Thyroid test previously been normal. Temporarily gave the patient oxycodone 5 mg, 1-2 every 6 hours as needed for  pain.  07/06/16 Insurance initially denied an MRI of the lumbar spine. Therefore the patient was referred to a neurologist for EMG nerve conduction studies. Nerve conduction studies confirmed lumbosacral radiculopathy. Afterwards MRI was obtained of the lumbar spine that did show chronic bilateral pars interarticularis defects with mild anterolisthesis however there was no spinal stenosis or foraminal stenosis identified and no nerve impingement seen on MRI. Neurology recommended MRI of the pelvis to evaluate for compressive lesions outside the spinal cord possibly causing nerve pain however insurance will not cover MRI of the pelvis either. She continues to have severe burning pain on the anterior surface of both thighs from his waist to his knees. He also has burning stinging pain in both feet. He is also due to follow-up his hemoglobin A1c as well as to recheck his white blood cell count as he had persistent leukocytosis after his hospitalization. He denies any symptoms of infection Past Medical History:  Diagnosis Date  . Anxiety   . Arthritis   . CAD (coronary artery disease) 04/07/2012   Inferoposterior STEMI s/p DES-mid LCx  . Diabetes mellitus   . Diabetic neuropathy ([place])   . GERD (gastroesophageal reflux disease)   . High cholesterol   . Hypertension   . Myocardial infarction ([place])   . Obesity   . Pancreatitis    a. mild by CT 06/2011  . Peptic ulcer disease    a. 06/2009 EGD: multiple gastric and duodenal ulcers.   Past Surgical History:  Procedure Laterality Date  . CORONARY ANGIOPLASTY WITH STENT PLACEMENT  04/07/2012   70% prox LAD, 70-80% ostial diagonal, 70% mid-distal LAD, 80% prox OM1, mid LCx totally occluded just distal to OM1 s/p DES, occluded RCA; severe inferior HK, LVEF 45%  . CORONARY ARTERY BYPASS GRAFT N/A 04/23/2016   Procedure: CORONARY ARTERY BYPASS GRAFTING (CABG) x 4;  Surgeon: Gaye Pollack, MD;  Location: [facility];  Service: Open Heart Surgery;  Laterality: N/A;  .  ENDOVEIN HARVEST OF GREATER SAPHENOUS VEIN Right 04/23/2016   Procedure: ENDOVEIN HARVEST OF GREATER SAPHENOUS VEIN;  Surgeon: Gaye Pollack, MD;  Location: [facility];  Service: Open Heart Surgery;  Laterality: Right;  . ESOPHAGOGASTRODUODENOSCOPY  06/18/2011   NL ESOPHAGUS/UlcerATED LESIONS/ SUPERFICIAL Ulcers  . INTRAVASCULAR PRESSURE WIRE/FFR STUDY N/A 04/13/2016   Procedure: Intravascular Pressure Wire/FFR Study;  Surgeon: Nelva Bush, MD;  Location: [facility];  Service: Cardiovascular;  Laterality: N/A;  . LEFT HEART CATH AND CORONARY ANGIOGRAPHY N/A 04/13/2016   Procedure: Left Heart Cath and Coronary Angiography;  Surgeon: Nelva Bush, MD;  Location: [facility];  Service: Cardiovascular;  Laterality: N/A;  . LEFT HEART CATHETERIZATION WITH CORONARY ANGIOGRAM N/A 04/07/2012   Procedure: LEFT HEART CATHETERIZATION WITH CORONARY ANGIOGRAM;  Surgeon: Sherren Mocha,  MD;  Location: [facility];  Service: Cardiovascular;  Laterality: N/A;  . PERCUTANEOUS CORONARY STENT INTERVENTION (PCI-S)  04/07/2012   Procedure: PERCUTANEOUS CORONARY STENT INTERVENTION (PCI-S);  Surgeon: Sherren Mocha, MD;  Location: [facility] C[facility];  Service: Cardiovascular;;  . TEE WITHOUT CARDIOVERSION N/A 04/23/2016   Procedure: TRANSESOPHAGEAL ECHOCARDIOGRAM (TEE);  Surgeon: Gaye Pollack, MD;  Location: [facility];  Service: Open Heart Surgery;  Laterality: N/A;   Current Outpatient Prescriptions on File Prior to Visit  Medication Sig Dispense Refill  . aspirin EC 81 MG EC tablet Take 1 tablet (81 mg total) by mouth daily.    Marland Kitchen atorvastatin (LIPITOR) 40 MG tablet Take 1 tablet (40 mg total) by mouth daily. (Patient taking differently: Take 40 mg by mouth at bedtime. ) 30 tablet 5  . docusate sodium (COLACE) 100 MG capsule Take 200 mg by mouth at bedtime.    . empagliflozin (JARDIANCE) 25 MG TABS tablet Take 25 mg by mouth daily. 30 tablet 11  . escitalopram (LEXAPRO) 10 MG tablet Take 10 mg by mouth at bedtime.    0  . gabapentin (NEURONTIN) 600 MG tablet Take 1.5 tablets (900 mg total) by mouth 3 (three) times daily. 120 tablet 11  . lisinopril (PRINIVIL,ZESTRIL) 2.5 MG tablet Take 1 tablet (2.5 mg total) by mouth daily. 90 tablet 3  . metFORMIN (GLUCOPHAGE) 1000 MG tablet TAKE ONE TABLET BY MOUTH TWICE DAILY 60 tablet 3  . metoprolol tartrate (LOPRESSOR) 25 MG tablet Take 1 tablet (25 mg total) by mouth 2 (two) times daily. 180 tablet 2  . nitroGLYCERIN (NITROSTAT) 0.4 MG SL tablet Place 1 tablet (0.4 mg total) under the tongue every 5 (five) minutes as needed for chest pain. X 3 doses 25 tablet prn  . oxyCODONE (OXY IR/ROXICODONE) 5 MG immediate release tablet Take 1-2 tablets (5-10 mg total) by mouth every 3 (three) hours as needed for severe pain. 60 tablet 0  . pantoprazole (PROTONIX) 40 MG tablet Take 1 tablet (40 mg total) by mouth daily. 30 tablet 11   No current facility-administered medications on file prior to visit.    Allergies  Allergen Reactions  . No Known Allergies    Social History   Social History  . Marital status: Married    Spouse name: N/A  . Number of children: 2  . Years of education: N/A   Occupational History  . truck driver    Social History Main Topics  . Smoking status: Former Smoker    Years: 20.00    Quit date: 2000  . Smokeless tobacco: Former Systems developer    Quit date: 01/01/1998  . Alcohol use No  . Drug use: No  . Sexual activity: Yes   Other Topics Concern  . Not on file   Social History Narrative   Lives in [place], with his wife and 2 children         Review of Systems  All other systems reviewed and are negative.      Objective:   Physical Exam  Constitutional: He appears well-developed and well-nourished.  Neck: Neck supple.  Cardiovascular: Normal rate, regular rhythm, normal heart sounds and intact distal pulses.   Pulmonary/Chest: Effort normal and breath sounds normal. No respiratory distress. He has no wheezes. He has no rales. He  exhibits tenderness.  Abdominal: Soft. Bowel sounds are normal. He exhibits no distension. There is no tenderness. There is no rebound.  Musculoskeletal: He exhibits tenderness.       Legs: Lymphadenopathy:  He has no cervical adenopathy.  Skin: No rash noted. No erythema.  Vitals reviewed. The area the patient experiences pain and numbness is demarcated in red on the diagram        Assessment & Plan:  Uncontrolled type 2 diabetes mellitus with diabetic polyneuropathy, without long-term current use of insulin ([place]) - Plan: CBC with Differential/Platelet, Hemoglobin A1c  Lumbosacral radiculopathy - Plan: Ambulatory referral to Pain Clinic  Leukocytosis, unspecified type   There are no areas for surgical intervention. Therefore I will consult a pain clinic to see if the patient is a candidate for lumbar nerve blocks for pain management rather than chronic narcotic dependence.  I will recheck a CBC and if the patient's leukocytosis persist, I will proceed with CT scan of the abdomen and pelvis to evaluate for lymphadenopathy and consult hematology. Also repeat a hemoglobin A1c today

## 2016-07-07 ENCOUNTER — Other Ambulatory Visit: Payer: Self-pay | Admitting: Family Medicine

## 2016-07-07 LAB — CBC WITH DIFFERENTIAL/PLATELET
Basophils Absolute: 86 cells/uL (ref 0–200)
Basophils Relative: 1 %
EOS ABS: 344 {cells}/uL (ref 15–500)
Eosinophils Relative: 4 %
HEMATOCRIT: 43.5 % (ref 38.5–50.0)
HEMOGLOBIN: 14.6 g/dL (ref 13.0–17.0)
LYMPHS ABS: 3096 {cells}/uL (ref 850–3900)
Lymphocytes Relative: 36 %
MCH: 31.5 pg (ref 27.0–33.0)
MCHC: 33.6 g/dL (ref 32.0–36.0)
MCV: 93.8 fL (ref 80.0–100.0)
MONO ABS: 946 {cells}/uL (ref 200–950)
MPV: 9.7 fL (ref 7.5–12.5)
Monocytes Relative: 11 %
NEUTROS PCT: 48 %
Neutro Abs: 4128 cells/uL (ref 1500–7800)
Platelets: 275 10*3/uL (ref 140–400)
RBC: 4.64 MIL/uL (ref 4.20–5.80)
RDW: 13.3 % (ref 11.0–15.0)
WBC: 8.6 10*3/uL (ref 3.8–10.8)

## 2016-07-07 LAB — HEMOGLOBIN A1C
Hgb A1c MFr Bld: 5.7 % — ABNORMAL HIGH (ref <5.7)
MEAN PLASMA GLUCOSE: 117 mg/dL

## 2016-07-09 ENCOUNTER — Encounter: Payer: Self-pay | Admitting: Family Medicine

## 2016-07-12 ENCOUNTER — Other Ambulatory Visit: Payer: BLUE CROSS/BLUE SHIELD

## 2016-07-31 ENCOUNTER — Ambulatory Visit (INDEPENDENT_AMBULATORY_CARE_PROVIDER_SITE_OTHER): Payer: BLUE CROSS/BLUE SHIELD | Admitting: Family Medicine

## 2016-07-31 ENCOUNTER — Encounter: Payer: Self-pay | Admitting: Family Medicine

## 2016-07-31 VITALS — BP 90/62 | HR 78 | Temp 98.2°F | Resp 18 | Ht 69.0 in | Wt 218.0 lb

## 2016-07-31 DIAGNOSIS — M720 Palmar fascial fibromatosis [Dupuytren]: Secondary | ICD-10-CM

## 2016-07-31 DIAGNOSIS — D170 Benign lipomatous neoplasm of skin and subcutaneous tissue of head, face and neck: Secondary | ICD-10-CM

## 2016-07-31 MED ORDER — OXYCODONE HCL 5 MG PO TABS: 5.0000 mg | ORAL_TABLET | ORAL | 0 refills | Status: DC | PRN

## 2016-07-31 NOTE — Progress Notes (Signed)
Subjective:    Patient ID: Juan Ray, male    DOB: 01-17-1960, 56 y.o.   MRN: [id number]  HPI Patient is here today complaining of a painful "knot" in the palm of his left hand. On examination, however the third flexor tendon, there are 2 dense fibrous nodules with a central pit.  One is tender to palpation. There is no erythema. The third and fourth digit is not drawn up into a contracture.  This has been gradually forming over the last month or so.  He also has a mass on his upper right forehead about the size of a ping-pong ball. This is been there for many years. It appears to be either a lipoma or some type of sebaceous cyst. He would like to see a general surgeon about having this removed due to the disfiguring nature Past Medical History:  Diagnosis Date  . Anxiety   . Arthritis   . CAD (coronary artery disease) 04/07/2012   Inferoposterior STEMI s/p DES-mid LCx  . Diabetes mellitus   . Diabetic neuropathy ([place])   . GERD (gastroesophageal reflux disease)   . High cholesterol   . Hypertension   . Myocardial infarction ([place])   . Obesity   . Pancreatitis    a. mild by CT 06/2011  . Peptic ulcer disease    a. 06/2009 EGD: multiple gastric and duodenal ulcers.   Past Surgical History:  Procedure Laterality Date  . CORONARY ANGIOPLASTY WITH STENT PLACEMENT  04/07/2012   70% prox LAD, 70-80% ostial diagonal, 70% mid-distal LAD, 80% prox OM1, mid LCx totally occluded just distal to OM1 s/p DES, occluded RCA; severe inferior HK, LVEF 45%  . CORONARY ARTERY BYPASS GRAFT N/A 04/23/2016   Procedure: CORONARY ARTERY BYPASS GRAFTING (CABG) x 4;  Surgeon: Gaye Pollack, MD;  Location: [facility];  Service: Open Heart Surgery;  Laterality: N/A;  . ENDOVEIN HARVEST OF GREATER SAPHENOUS VEIN Right 04/23/2016   Procedure: ENDOVEIN HARVEST OF GREATER SAPHENOUS VEIN;  Surgeon: Gaye Pollack, MD;  Location: [facility];  Service: Open Heart Surgery;  Laterality: Right;  . ESOPHAGOGASTRODUODENOSCOPY   06/18/2011   NL ESOPHAGUS/UlcerATED LESIONS/ SUPERFICIAL Ulcers  . INTRAVASCULAR PRESSURE WIRE/FFR STUDY N/A 04/13/2016   Procedure: Intravascular Pressure Wire/FFR Study;  Surgeon: Nelva Bush, MD;  Location: [facility];  Service: Cardiovascular;  Laterality: N/A;  . LEFT HEART CATH AND CORONARY ANGIOGRAPHY N/A 04/13/2016   Procedure: Left Heart Cath and Coronary Angiography;  Surgeon: Nelva Bush, MD;  Location: [facility];  Service: Cardiovascular;  Laterality: N/A;  . LEFT HEART CATHETERIZATION WITH CORONARY ANGIOGRAM N/A 04/07/2012   Procedure: LEFT HEART CATHETERIZATION WITH CORONARY ANGIOGRAM;  Surgeon: Sherren Mocha, MD;  Location[facility];  Service: Cardiovascular;  Laterality: N/A;  . PERCUTANEOUS CORONARY STENT INTERVENTION (PCI-S)  04/07/2012   Procedure: PERCUTANEOUS CORONARY STENT INTERVENTION (PCI-S);  Surgeon: Sherren Mocha, MD;  Location: [facility] CATH LAB;  Service: Cardiovascular;;  . TEE WITHOUT CARDIOVERSION N/A 04/23/2016   Procedure: TRANSESOPHAGEAL ECHOCARDIOGRAM (TEE);  Surgeon: Gaye Pollack, MD;  Location: [facility];  Service: Open Heart Surgery;  Laterality: N/A;   Current Outpatient Prescriptions on File Prior to Visit  Medication Sig Dispense Refill  . aspirin EC 81 MG EC tablet Take 1 tablet (81 mg total) by mouth daily.    Marland Kitchen atorvastatin (LIPITOR) 40 MG tablet Take 1 tablet (40 mg total) by mouth daily. (Patient taking differently: Take 40 mg by mouth at bedtime. ) 30 tablet 5  .  docusate sodium (COLACE) 100 MG capsule Take 200 mg by mouth at bedtime.    . empagliflozin (JARDIANCE) 25 MG TABS tablet Take 25 mg by mouth daily. 30 tablet 11  . escitalopram (LEXAPRO) 10 MG tablet TAKE 1 TABLET BY MOUTH ONCE DAILY 90 tablet 3  . gabapentin (NEURONTIN) 600 MG tablet Take 1.5 tablets (900 mg total) by mouth 3 (three) times daily. 120 tablet 11  . lisinopril (PRINIVIL,ZESTRIL) 2.5 MG tablet Take 1 tablet (2.5 mg total) by mouth daily. 90 tablet 3  .  metFORMIN (GLUCOPHAGE) 1000 MG tablet TAKE ONE TABLET BY MOUTH TWICE DAILY 60 tablet 3  . metoprolol tartrate (LOPRESSOR) 25 MG tablet Take 1 tablet (25 mg total) by mouth 2 (two) times daily. 180 tablet 2  . nitroGLYCERIN (NITROSTAT) 0.4 MG SL tablet Place 1 tablet (0.4 mg total) under the tongue every 5 (five) minutes as needed for chest pain. X 3 doses 25 tablet prn  . pantoprazole (PROTONIX) 40 MG tablet Take 1 tablet (40 mg total) by mouth daily. 30 tablet 11   No current facility-administered medications on file prior to visit.    Allergies  Allergen Reactions  . No Known Allergies    Social History   Social History  . Marital status: Married    Spouse name: N/A  . Number of children: 2  . Years of education: N/A   Occupational History  . truck driver    Social History Main Topics  . Smoking status: Former Smoker    Years: 20.00    Quit date: 2000  . Smokeless tobacco: Former Systems developer    Quit date: 01/01/1998  . Alcohol use No  . Drug use: No  . Sexual activity: Yes   Other Topics Concern  . Not on file   Social History Narrative   Lives in [place], with his wife and 2 children         Review of Systems  All other systems reviewed and are negative.      Objective:   Physical Exam  HENT:  Head:    Cardiovascular: Normal rate, regular rhythm and normal heart sounds.   Pulmonary/Chest: Effort normal and breath sounds normal. No respiratory distress. He has no wheezes. He has no rales.  Musculoskeletal:       Hands: Vitals reviewed.         Assessment & Plan:  Dupuytren's contracture of left hand - Plan: Ambulatory referral to Hand Surgery  Lipoma of face - Plan: Ambulatory referral to General Surgery  Recommended a consultation with a hand surgeon. I believe these are Dupuytren's nodules and represent the development of contracture in the palm of the left hand. Patient may benefit from intralesional corticosteroid injection. Also recommended  general surgery consultation for removal of the soft tissue mass on the forehead which appears to be either a cyst or some type of well-circumscribed lipoma.

## 2016-08-08 DIAGNOSIS — R229 Localized swelling, mass and lump, unspecified: Secondary | ICD-10-CM | POA: Diagnosis not present

## 2016-08-08 DIAGNOSIS — M720 Palmar fascial fibromatosis [Dupuytren]: Secondary | ICD-10-CM | POA: Diagnosis not present

## 2016-08-14 ENCOUNTER — Telehealth: Payer: Self-pay

## 2016-08-14 NOTE — Telephone Encounter (Signed)
Clearance request received from [facility] (Dr. Fredna Dow) for patient to have excision dorsal mass left hand with IV regional forearm block on 08/24/16.  Clearance to be faxed to [phone] attn Cyndee Brightly, RN

## 2016-08-19 NOTE — Telephone Encounter (Signed)
Pt s/p CABG earlier this year. Stable at time of last assessment in May 2018. OK to proceed with surgery at low risk of cardiac complications. thanks

## 2016-08-20 NOTE — Telephone Encounter (Signed)
Forwarded to Cyndee Brightly, RN at fax # [phone].

## 2016-08-23 DIAGNOSIS — D225 Melanocytic nevi of trunk: Secondary | ICD-10-CM | POA: Diagnosis not present

## 2016-08-23 DIAGNOSIS — D170 Benign lipomatous neoplasm of skin and subcutaneous tissue of head, face and neck: Secondary | ICD-10-CM | POA: Diagnosis not present

## 2016-08-27 ENCOUNTER — Encounter: Payer: Self-pay | Admitting: Cardiovascular Disease

## 2016-08-27 ENCOUNTER — Ambulatory Visit (INDEPENDENT_AMBULATORY_CARE_PROVIDER_SITE_OTHER): Payer: BLUE CROSS/BLUE SHIELD | Admitting: Cardiovascular Disease

## 2016-08-27 VITALS — BP 102/54 | HR 81 | Ht 69.0 in | Wt 223.2 lb

## 2016-08-27 DIAGNOSIS — I2510 Atherosclerotic heart disease of native coronary artery without angina pectoris: Secondary | ICD-10-CM | POA: Diagnosis not present

## 2016-08-27 NOTE — Progress Notes (Signed)
Cardiology Office Note Date:  08/27/2016   ID:  Juan Ray, DOB 11-17-1960, MRN [id number]  PCP:  Susy Frizzle, MD  Cardiologist:  Sherren Mocha, MD    Chief Complaint  Patient presents with  . Leg Pain     History of Present Illness: Juan Ray is a 56 y.o. male who presents for follow-up evaluation. The patient is followed for coronary artery disease and initially presented in 2014 with an ST elevation infarction. Patient was treated with a drug-eluting stent in the circumflex at that time. Residual CAD was treated medically. The patient developed recurrent chest pain earlier this year and underwent cardiac catheterization demonstrating progressive coronary artery disease involving all of his major coronary vessels. He was treated with multivessel CABG with a LIMA to LAD, sequential vein graft to OM1 and OM 2, and vein graft to the distal right coronary artery. His postoperative course was uncomplicated.  From a cardiac perspective he is doing very well. Today, he denies symptoms of palpitations, chest pain, shortness of breath, orthopnea, PND, lower extremity edema, dizziness, or syncope. His biggest problem since surgery relates to bilateral leg pain. He complains of numbness and tingling in his legs. He has seen his PCP and neurology and has undergone testing. He is now on neurontin and using analgesics as needed. Otherwise he is doing very well and has lost a lot of weight through diet and exercise. His weight is down over 50 pounds. He states that his hemoglobin A1c is improved from a range of 11-14 before his dieting now down to a most recent hemoglobin A1c of 5.7.  Past Medical History:  Diagnosis Date  . Anxiety   . Arthritis   . CAD (coronary artery disease) 04/07/2012   Inferoposterior STEMI s/p DES-mid LCx  . Diabetes mellitus   . Diabetic neuropathy ([place])   . GERD (gastroesophageal reflux disease)   . High cholesterol   . Hypertension   . Myocardial  infarction ([place])   . Obesity   . Pancreatitis    a. mild by CT 06/2011  . Peptic ulcer disease    a. 06/2009 EGD: multiple gastric and duodenal ulcers.    Past Surgical History:  Procedure Laterality Date  . CORONARY ANGIOPLASTY WITH STENT PLACEMENT  04/07/2012   70% prox LAD, 70-80% ostial diagonal, 70% mid-distal LAD, 80% prox OM1, mid LCx totally occluded just distal to OM1 s/p DES, occluded RCA; severe inferior HK, LVEF 45%  . CORONARY ARTERY BYPASS GRAFT N/A 04/23/2016   Procedure: CORONARY ARTERY BYPASS GRAFTING (CABG) x 4;  Surgeon: Gaye Pollack, MD;  Location: [facility];  Service: Open Heart Surgery;  Laterality: N/A;  . ENDOVEIN HARVEST OF GREATER SAPHENOUS VEIN Right 04/23/2016   Procedure: ENDOVEIN HARVEST OF GREATER SAPHENOUS VEIN;  Surgeon: Gaye Pollack, MD;  Location: [facility];  Service: Open Heart Surgery;  Laterality: Right;  . ESOPHAGOGASTRODUODENOSCOPY  06/18/2011   NL ESOPHAGUS/UlcerATED LESIONS/ SUPERFICIAL Ulcers  . INTRAVASCULAR PRESSURE WIRE/FFR STUDY N/A 04/13/2016   Procedure: Intravascular Pressure Wire/FFR Study;  Surgeon: Nelva Bush, MD;  Location: [facility];  Service: Cardiovascular;  Laterality: N/A;  . LEFT HEART CATH AND CORONARY ANGIOGRAPHY N/A 04/13/2016   Procedure: Left Heart Cath and Coronary Angiography;  Surgeon: Nelva Bush, MD;  Location: [place] [facility] [facility];  Service: Cardiovascular;  Laterality: N/A;  . LEFT HEART CATHETERIZATION WITH CORONARY ANGIOGRAM N/A 04/07/2012   Procedure: LEFT HEART CATHETERIZATION WITH CORONARY ANGIOGRAM;  Surgeon: Sherren Mocha, MD;  Location: [facility]  CATH LAB;  Service: Cardiovascular;  Laterality: N/A;  . PERCUTANEOUS CORONARY STENT INTERVENTION (PCI-S)  04/07/2012   Procedure: PERCUTANEOUS CORONARY STENT INTERVENTION (PCI-S);  Surgeon: Sherren Mocha, MD;  Location: [facility] CATH LAB;  Service: Cardiovascular;;  . TEE WITHOUT CARDIOVERSION N/A 04/23/2016   Procedure: TRANSESOPHAGEAL ECHOCARDIOGRAM (TEE);  Surgeon: Gaye Pollack, MD;  Location: [facility];  Service: Open Heart Surgery;  Laterality: N/A;    Current Outpatient Prescriptions  Medication Sig Dispense Refill  . aspirin EC 81 MG EC tablet Take 1 tablet (81 mg total) by mouth daily.    Marland Kitchen atorvastatin (LIPITOR) 40 MG tablet Take 40 mg by mouth at bedtime.    . docusate sodium (COLACE) 100 MG capsule Take 200 mg by mouth at bedtime.    . empagliflozin (JARDIANCE) 25 MG TABS tablet Take 25 mg by mouth daily. 30 tablet 11  . escitalopram (LEXAPRO) 10 MG tablet TAKE 1 TABLET BY MOUTH ONCE DAILY 90 tablet 3  . gabapentin (NEURONTIN) 600 MG tablet Take 1.5 tablets (900 mg total) by mouth 3 (three) times daily. 120 tablet 11  . metFORMIN (GLUCOPHAGE) 1000 MG tablet TAKE ONE TABLET BY MOUTH TWICE DAILY 60 tablet 3  . metoprolol tartrate (LOPRESSOR) 25 MG tablet Take 1 tablet (25 mg total) by mouth 2 (two) times daily. 180 tablet 2  . nitroGLYCERIN (NITROSTAT) 0.4 MG SL tablet Place 1 tablet (0.4 mg total) under the tongue every 5 (five) minutes as needed for chest pain. X 3 doses 25 tablet prn  . oxyCODONE (OXY IR/ROXICODONE) 5 MG immediate release tablet Take 1-2 tablets (5-10 mg total) by mouth every 3 (three) hours as needed for severe pain. 60 tablet 0  . pantoprazole (PROTONIX) 40 MG tablet Take 1 tablet (40 mg total) by mouth daily. 30 tablet 11  . lisinopril (PRINIVIL,ZESTRIL) 2.5 MG tablet Take 1 tablet (2.5 mg total) by mouth daily. 90 tablet 3   No current facility-administered medications for this visit.     Allergies:   No known allergies   Social History:  The patient  reports that he quit smoking about 18 years ago. He quit after 20.00 years of use. He quit smokeless tobacco use about 18 years ago. He reports that he does not drink alcohol or use drugs.   Family History:  The patient's  family history includes CAD in his maternal uncle; CVA in his sister; Heart attack in his father.    ROS:  Please see the history of present illness.  Otherwise,  review of systems is positive for snoring.  All other systems are reviewed and negative.    PHYSICAL EXAM: VS:  BP (!) 102/54   Pulse 81   Ht 5\' 9"  (1.753 m)   Wt 223 lb 3.2 oz (101.2 kg)   SpO2 97%   BMI 32.96 kg/m  , BMI Body mass index is 32.96 kg/m. GEN: Well nourished, well developed, in no acute distress  HEENT: normal  Neck: no JVD, no masses. No carotid bruits Cardiac: RRR without murmur or gallop                Respiratory:  clear to auscultation bilaterally, normal work of breathing GI: soft, nontender, nondistended, + BS MS: no deformity or atrophy  Ext: no pretibial edema, pedal pulses 2+= bilaterally Skin: warm and dry, no rash Neuro:  Strength and sensation are intact Psych: euthymic mood, full affect  EKG:  EKG is not ordered today.  Recent Labs: 03/11/2016: TSH 0.354 04/24/2016:  Magnesium 1.9 05/01/2016: ALT 50; BUN 16; Creat 0.98; Potassium 4.9; Sodium 135 07/06/2016: Hemoglobin 14.6; Platelets 275   Lipid Panel     Component Value Date/Time   CHOL 96 05/01/2016 0856   TRIG 144 05/01/2016 0856   HDL 22 (L) 05/01/2016 0856   CHOLHDL 4.4 05/01/2016 0856   VLDL 29 05/01/2016 0856   LDLCALC 45 05/01/2016 0856      Wt Readings from Last 3 Encounters:  08/27/16 223 lb 3.2 oz (101.2 kg)  07/31/16 218 lb (98.9 kg)  07/06/16 218 lb (98.9 kg)     ASSESSMENT AND PLAN: 1.  CAD, native vessel, without angina: The patient underwent CABG in April 2595 with an uncomplicated post hospital course. He is doing well, essentially asymptomatic at present. He will continue on his current medications.  2. Hypertension: When he was last seen in May his blood pressure medicine was reduced because of hypotension. Blood pressure is now improved. No changes are made today.  3. Hyperlipidemia: The patient is treated with a statin drug. His LDL cholesterol is at goal with most recent value 45 mg/dL.  Current medicines are reviewed with the patient today.  The patient does not  have concerns regarding medicines.  Labs/ tests ordered today include:  No orders of the defined types were placed in this encounter.   Disposition:   FU 6 months  Signed, Sherren Mocha, MD  08/27/2016 5:37 PM    [address], [place], [place]  [address] Phone: [phone]; Fax: [phone]

## 2016-08-27 NOTE — Patient Instructions (Addendum)
Medication Instructions:  Your physician recommends that you continue on your current medications as directed. Please refer to the Current Medication list given to you today.   Labwork: None ordered  Testing/Procedures: None ordered  Follow-Up: Your physician wants you to follow-up in: 6 months with Dr.Cooper You will receive a reminder letter in the mail two months in advance. If you don't receive a letter, please call our office to schedule the follow-up appointment.    Any Other Special Instructions Will Be Listed Below (If Applicable).     If you need a refill on your cardiac medications before your next appointment, please call your pharmacy.   

## 2016-08-28 ENCOUNTER — Encounter (INDEPENDENT_AMBULATORY_CARE_PROVIDER_SITE_OTHER): Payer: Self-pay | Admitting: Physical Medicine and Rehabilitation

## 2016-08-28 ENCOUNTER — Ambulatory Visit (INDEPENDENT_AMBULATORY_CARE_PROVIDER_SITE_OTHER): Payer: BLUE CROSS/BLUE SHIELD | Admitting: Physical Medicine and Rehabilitation

## 2016-08-28 VITALS — BP 131/81 | HR 64

## 2016-08-28 DIAGNOSIS — Q762 Congenital spondylolisthesis: Secondary | ICD-10-CM | POA: Diagnosis not present

## 2016-08-28 DIAGNOSIS — M5416 Radiculopathy, lumbar region: Secondary | ICD-10-CM

## 2016-08-28 DIAGNOSIS — E11610 Type 2 diabetes mellitus with diabetic neuropathic arthropathy: Secondary | ICD-10-CM | POA: Diagnosis not present

## 2016-08-28 DIAGNOSIS — M4316 Spondylolisthesis, lumbar region: Secondary | ICD-10-CM

## 2016-08-28 NOTE — Progress Notes (Deleted)
Bilateral hip and leg pain. Pain radiates to knees- anterior. Has had some lower back pain, but the hips and legs are most problematic. Pain is constant. Gets worse later in the day.

## 2016-08-30 ENCOUNTER — Encounter (INDEPENDENT_AMBULATORY_CARE_PROVIDER_SITE_OTHER): Payer: Self-pay | Admitting: Physical Medicine and Rehabilitation

## 2016-08-30 NOTE — Progress Notes (Addendum)
Bazil Dhanani - 56 y.o. male MRN [id number]  Date of birth: 08-11-1960  Office Visit Note: Visit Date: 08/28/2016 PCP: Susy Frizzle, MD Referred by: Susy Frizzle, MD  Subjective: Chief Complaint  Patient presents with  . Lower Back - Pain   HPI: [name] is a 56 year old gentleman accompanied by his wife who provided some of the history. He is kindly sent here for consultation by his primary care physician Dr. Dennard Schaumann. He has been having worsening low back pain with pain into both thighs down the legs for many months. He's had chronic low back pain for years. He reports this pain is bilateral hip and leg. Radiates to about the knees and somewhat anterior may be more of an L4 distribution. Somewhat L5 potentially. He reports the low back pain has been constant and ongoing for some time and he really feels with that fairly well with the hip and leg pain syndrome and problematic. He says the pain is constant. It does seem to get worse later in the day. He doesn't really report necessarily worse with standing or sitting. His medical history is complicated with history of diabetes and coronary artery disease. He has had coronary artery bypass grafting 4. He's had multiple stents. He also has a history of diabetic polyneuropathy. He has seen Dr. Jannifer Franklin and Dr. Lavell Anchors at [facility] neurology. He's had electrodiagnostic study of the lower limbs showing what they said was bilateral radiculopathy consistent with stenosis. He went on to have an MRI of the lumbar spine which was recently performed and this is reviewed below. This did show small listhesis of L5 on S1 with chronic bilateral L5 pars defects. There was no central or foraminal narrowing or stenosis or nerve compression. He has been taking oxycodone from his primary care physician as well as a decent dose of gabapentin. He reports is not helping very much. He reports the oxycodone does take the edge off. He rather not take that medication  if he didn't have to. He's had no prior spinal surgeries or trauma. He's had no prior injections. Fortunately he is not taking a blood thinner. He does take an aspirin a day.    Review of Systems  Constitutional: Negative for chills, fever, malaise/fatigue and weight loss.  HENT: Negative for hearing loss and sinus pain.   Eyes: Negative for blurred vision, double vision and photophobia.  Respiratory: Negative for cough and shortness of breath.   Cardiovascular: Negative for chest pain, palpitations and leg swelling.  Gastrointestinal: Negative for abdominal pain, nausea and vomiting.  Genitourinary: Negative for flank pain.  Musculoskeletal: Positive for back pain and joint pain. Negative for myalgias.  Skin: Negative for itching and rash.  Neurological: Negative for tremors, focal weakness and weakness.  Endo/Heme/Allergies: Negative.   Psychiatric/Behavioral: Negative for depression.  All other systems reviewed and are negative.  Otherwise per HPI.  Assessment & Plan: Visit Diagnoses:  1. Lumbar radiculopathy   2. Spondylolisthesis of lumbar region   3. Congenital spondylolysis   4. Type 2 diabetes mellitus with diabetic neuropathic arthropathy, without long-term current use of insulin (HCC)     Plan: Findings:  Chronic worsening bilateral hip and thigh pain down to the knees without real paresthesias. His pain is somewhat interesting from a clinical standpoint in that it's somewhat radicular but then his MRI does not show any severe compression. He does have bilateral pars defects with very small listhesis and I could argue that this could give him irritation of his  L5 nerve roots. He could in fact even have referral pattern just from pain from the facet joints and pars defect in general. He does carry a diagnosis of anxiety and sometimes to get referral patterns which are more severe than he would expect even from more mechanical type things such as a pars defect. However we also have  the electrodiagnostic study by Dr. Lavell Anchors showing radiculopathies on the needle EMG. I wonder how much of this could be related to his diabetes. I do think it is worthwhile to complete diagnostic bilateral L5 transforaminal epidural steroid injection just to see if he gets some relief. We discussed this at length and he does want to proceed with that. We can make further recommendations depending on the relief with the injection. We talked about hamstring stretching and conservative care. He probably would do well with regrouping with a physical therapist for a short course of physical person. I would not change any medications at this point. Clearly from a pathological standpoint and his spine and this is not a chronic opioid type of situation for him.    Meds & Orders: No orders of the defined types were placed in this encounter.  No orders of the defined types were placed in this encounter.   Follow-up: Return for Bilateral L5 transforaminal epidural steroid injection.   Procedures: No procedures performed  No notes on file   Clinical History: MRI LUMBAR SPINE WITHOUT CONTRAST  TECHNIQUE: Multiplanar, multisequence MR imaging of the lumbar spine was performed. No intravenous contrast was administered.  COMPARISON:  CT abdomen and pelvis 07/28/2015 and 06/19/2011.  FINDINGS: Segmentation:  Standard.  Alignment: Trace anterolisthesis L5 on S1 is due to chronic bilateral L5 pars interarticularis defects.  Vertebrae: No fracture or worrisome marrow lesion. Scattered small Schmorl's nodes noted.  Conus medullaris: Extends to the T12 level and appears normal.  Paraspinal and other soft tissues: Negative.  Disc levels:  T10-11, T11-12 and T12-L1 are imaged in the sagittal plane only. A small central protrusion is seen at T11-12 and there is minimal disc bulging at T10-11 and T12-L1. The central canal and foramina are open at these levels.  L1-2:  Negative.  L2-3:   The disc is desiccated with a minimal bulge.  No stenosis.  L3-4:  Very shallow disc bulge without stenosis.  L4-5: Shallow disc bulge without stenosis. Mild facet degenerative disease is more notable on the right.  L5-S1: The disc is slightly uncovered. The central canal is widely patent and the foramina are open.  IMPRESSION: Negative for central canal or foraminal narrowing.  Chronic bilateral L5 pars interarticularis defects result in trace anterolisthesis L5 on S1.   Electronically Signed   By: Inge Rise M.D.   On: 06/22/2016 12:18  He reports that he quit smoking about 18 years ago. He quit after 20.00 years of use. He quit smokeless tobacco use about 18 years ago.   Recent Labs  01/27/16 1211 04/19/16 1110 07/06/16 1255  HGBA1C 10.2* 7.8* 5.7*    Objective:  VS:  HT:    WT:   BMI:     BP:131/81  HR:64bpm  TEMP: ( )  RESP:  Physical Exam  Constitutional: He is oriented to person, place, and time. He appears well-developed and well-nourished. No distress.  HENT:  Head: Normocephalic and atraumatic.  Nose: Nose normal.  Mouth/Throat: Oropharynx is clear and moist.  Eyes: Pupils are equal, round, and reactive to light. Conjunctivae are normal.  Neck: Normal range of motion. Neck  supple.  Cardiovascular: Regular rhythm and intact distal pulses.   Pulmonary/Chest: Effort normal and breath sounds normal.  Abdominal: Soft. He exhibits no distension.  Musculoskeletal: He exhibits no deformity.  Patient stands with a forward flexed lumbar spine. He has pain with extension of the lumbar spine. He has no pain with hip rotation and seems to have pretty good movement of the hips. He does have pain at end ranges. He is somewhat tender over the greater trochanters bilaterally. He has good distal strength without clonus.  Neurological: He is alert and oriented to person, place, and time. He exhibits normal muscle tone. Coordination normal.  Skin: Skin is warm.  No rash noted.  Psychiatric: He has a normal mood and affect. His behavior is normal.  Nursing note and vitals reviewed.   Ortho Exam Imaging: No results found.  Past Medical/Family/Surgical/Social History: Medications & Allergies reviewed per EMR Patient Active Problem List   Diagnosis Date Noted  . S/P CABG x 4 04/23/2016  . Abnormal stress test 04/13/2016  . Chest pain 03/11/2016  . Ischemic cardiomyopathy 07/02/2013  . Midsternal chest pain 07/11/2012  . ST elevation myocardial infarction (STEMI) of inferoposterior wall ([place]) 04/09/2012  . CAD (coronary artery disease), native coronary artery 04/09/2012  . PUD (peptic ulcer disease) 04/09/2012  . Epigastric pain 06/18/2011  . HTN (hypertension) 06/18/2011  . DM (diabetes mellitus) ([place]) 06/18/2011  . Hyperlipidemia 06/18/2011   Past Medical History:  Diagnosis Date  . Anxiety   . Arthritis   . CAD (coronary artery disease) 04/07/2012   Inferoposterior STEMI s/p DES-mid LCx  . Diabetes mellitus   . Diabetic neuropathy ([place])   . GERD (gastroesophageal reflux disease)   . High cholesterol   . Hypertension   . Myocardial infarction ([place])   . Obesity   . Pancreatitis    a. mild by CT 06/2011  . Peptic ulcer disease    a. 06/2009 EGD: multiple gastric and duodenal ulcers.   Family History  Problem Relation Age of Onset  . Heart attack Father        MI x 2 in late 50's, currently 96's  . CVA Sister        late 60s  . CAD Maternal Uncle        MI at 29   Past Surgical History:  Procedure Laterality Date  . CORONARY ANGIOPLASTY WITH STENT PLACEMENT  04/07/2012   70% prox LAD, 70-80% ostial diagonal, 70% mid-distal LAD, 80% prox OM1, mid LCx totally occluded just distal to OM1 s/p DES, occluded RCA; severe inferior HK, LVEF 45%  . CORONARY ARTERY BYPASS GRAFT N/A 04/23/2016   Procedure: CORONARY ARTERY BYPASS GRAFTING (CABG) x 4;  Surgeon: Gaye Pollack, MD;  Location: [facility];  Service: Open Heart Surgery;  Laterality:  N/A;  . ENDOVEIN HARVEST OF GREATER SAPHENOUS VEIN Right 04/23/2016   Procedure: ENDOVEIN HARVEST OF GREATER SAPHENOUS VEIN;  Surgeon: Gaye Pollack, MD;  Location: [facility];  Service: Open Heart Surgery;  Laterality: Right;  . ESOPHAGOGASTRODUODENOSCOPY  06/18/2011   NL ESOPHAGUS/UlcerATED LESIONS/ SUPERFICIAL Ulcers  . INTRAVASCULAR PRESSURE WIRE/FFR STUDY N/A 04/13/2016   Procedure: Intravascular Pressure Wire/FFR Study;  Surgeon: Nelva Bush, MD;  Location: [place] [facility];  Service: Cardiovascular;  Laterality: N/A;  . LEFT HEART CATH AND CORONARY ANGIOGRAPHY N/A 04/13/2016   Procedure: Left Heart Cath and Coronary Angiography;  Surgeon: Nelva Bush, MD;  Location: [place] [facility];  Service: Cardiovascular;  Laterality: N/A;  . LEFT  HEART CATHETERIZATION WITH CORONARY ANGIOGRAM N/A 04/07/2012   Procedure: LEFT HEART CATHETERIZATION WITH CORONARY ANGIOGRAM;  Surgeon: Sherren Mocha, MD;  Location[facility] CATH LAB;  Service: Cardiovascular;  Laterality: N/A;  . PERCUTANEOUS CORONARY STENT INTERVENTION (PCI-S)  04/07/2012   Procedure: PERCUTANEOUS CORONARY STENT INTERVENTION (PCI-S);  Surgeon: Sherren Mocha, MD;  Location: [facility] CATH LAB;  Service: Cardiovascular;;  . TEE WITHOUT CARDIOVERSION N/A 04/23/2016   Procedure: TRANSESOPHAGEAL ECHOCARDIOGRAM (TEE);  Surgeon: Gaye Pollack, MD;  Location: [place];  Service: Open Heart Surgery;  Laterality: N/A;   Social History   Occupational History  . truck driver    Social History Main Topics  . Smoking status: Former Smoker    Years: 20.00    Quit date: 2000  . Smokeless tobacco: Former Systems developer    Quit date: 01/01/1998  . Alcohol use No  . Drug use: No  . Sexual activity: Yes

## 2016-09-05 ENCOUNTER — Encounter (INDEPENDENT_AMBULATORY_CARE_PROVIDER_SITE_OTHER): Payer: Self-pay | Admitting: Physical Medicine and Rehabilitation

## 2016-09-05 ENCOUNTER — Ambulatory Visit (INDEPENDENT_AMBULATORY_CARE_PROVIDER_SITE_OTHER): Payer: BLUE CROSS/BLUE SHIELD

## 2016-09-05 ENCOUNTER — Ambulatory Visit (INDEPENDENT_AMBULATORY_CARE_PROVIDER_SITE_OTHER): Payer: BLUE CROSS/BLUE SHIELD | Admitting: Physical Medicine and Rehabilitation

## 2016-09-05 VITALS — BP 93/59 | HR 90

## 2016-09-05 DIAGNOSIS — M4316 Spondylolisthesis, lumbar region: Secondary | ICD-10-CM | POA: Diagnosis not present

## 2016-09-05 DIAGNOSIS — Q762 Congenital spondylolisthesis: Secondary | ICD-10-CM | POA: Diagnosis not present

## 2016-09-05 DIAGNOSIS — M5416 Radiculopathy, lumbar region: Secondary | ICD-10-CM

## 2016-09-05 MED ORDER — LIDOCAINE HCL (PF) 1 % IJ SOLN
2.0000 mL | Freq: Once | INTRAMUSCULAR | Status: AC
Start: 2016-09-05 — End: 2016-09-05
  Administered 2016-09-05: 2 mL

## 2016-09-05 MED ORDER — BETAMETHASONE SOD PHOS & ACET 6 (3-3) MG/ML IJ SUSP
12.0000 mg | Freq: Once | INTRAMUSCULAR | Status: AC
  Administered 2016-09-05: 12 mg

## 2016-09-05 NOTE — Progress Notes (Deleted)
Patient is here today for planned bilateral L5 transforaminal injection. No change in symptoms.

## 2016-09-05 NOTE — Patient Instructions (Signed)

## 2016-09-06 ENCOUNTER — Other Ambulatory Visit: Payer: Self-pay | Admitting: *Deleted

## 2016-09-06 NOTE — Telephone Encounter (Signed)
Received call from patient.   Requested refill on Oxycodone.   Ok to refill??  Last office visit/ refill 07/21/2016.

## 2016-09-07 MED ORDER — OXYCODONE HCL 5 MG PO TABS: 5.0000 mg | ORAL_TABLET | ORAL | 0 refills | Status: DC | PRN

## 2016-09-07 NOTE — Telephone Encounter (Signed)
RX printed, left up front and patient aware to pick up after 2 pm  

## 2016-09-07 NOTE — Telephone Encounter (Signed)
ok 

## 2016-09-10 DIAGNOSIS — D481 Neoplasm of uncertain behavior of connective and other soft tissue: Secondary | ICD-10-CM | POA: Diagnosis not present

## 2016-09-11 NOTE — Procedures (Signed)
[name] is a 56 year old gentleman who comes in today for planned bilateral L5 transforaminal epidural steroid injections. Please see our prior evaluation and management note for further details and justification. The injection  will be diagnostic and hopefully therapeutic. The patient has failed conservative care including time, medications and activity modification.  Lumbosacral Transforaminal Epidural Steroid Injection - Infraneural Approach with Fluoroscopic Guidance  Patient: Juan Ray      Date of Birth: [birth year] MRN: [id number] PCP: Susy Frizzle, MD      Visit Date: 09/05/2016   Universal Protocol:     Consent Given By: the patient  Position: PRONE   Additional Comments: Vital signs were monitored before and after the procedure. Patient was prepped and draped in the usual sterile fashion. The correct patient, procedure, and site was verified.   Injection Procedure Details:  Procedure Site One Meds Administered:  Meds ordered this encounter  Medications  . lidocaine (PF) (XYLOCAINE) 1 % injection 2 mL  . betamethasone acetate-betamethasone sodium phosphate (CELESTONE) injection 12 mg      Laterality: Bilateral  Location/Site:  L5-S1  Needle size: 22 G  Needle type: Spinal  Needle Placement: Transforaminal  Findings:  -Contrast Used: 1 mL iohexol 180 mg iodine/mL   -Comments: Excellent flow of contrast along the nerve and into the epidural space. The angle of entry into the left foramen was somewhat difficult due to the patient's anatomy but it did seem to get adequate flow.  Procedure Details: After squaring off the end-plates of the desired vertebral level to get a true AP view, the C-arm was obliqued to the painful side so that the superior articulating process is positioned about 1/3 the length of the inferior endplate.  The needle was aimed toward the junction of the superior articular process and the transverse process of the inferior  vertebrae. The needle's initial entry is in the lower third of the foramen through Kambin's triangle. The soft tissues overlying this target were infiltrated with 2-3 ml. of 1% Lidocaine without Epinephrine.  The spinal needle was then inserted and advanced toward the target using a "trajectory" view along the fluoroscope beam.  Under AP and lateral visualization, the needle was advanced so it did not puncture dura and did not traverse medially beyond the 6 o'clock position of the pedicle. Bi-planar projections were used to confirm position. Aspiration was confirmed to be negative for CSF and/or blood. A 1-2 ml. volume of Isovue-250 was injected and flow of contrast was noted at each level. Radiographs were obtained for documentation purposes.   After attaining the desired flow of contrast documented above, a 0.5 to 1.0 ml test dose of 0.25% Marcaine was injected into each respective transforaminal space.  The patient was observed for 90 seconds post injection.  After no sensory deficits were reported, and normal lower extremity motor function was noted,   the above injectate was administered so that equal amounts of the injectate were placed at each foramen (level) into the transforaminal epidural space.   Additional Comments:  The patient tolerated the procedure well Dressing: Band-Aid    Post-procedure details: Patient was observed during the procedure. Post-procedure instructions were reviewed.  Patient left the clinic in stable condition.

## 2016-09-19 ENCOUNTER — Ambulatory Visit (INDEPENDENT_AMBULATORY_CARE_PROVIDER_SITE_OTHER): Payer: BLUE CROSS/BLUE SHIELD | Admitting: Physical Medicine and Rehabilitation

## 2016-09-19 ENCOUNTER — Encounter (INDEPENDENT_AMBULATORY_CARE_PROVIDER_SITE_OTHER): Payer: Self-pay | Admitting: Physical Medicine and Rehabilitation

## 2016-09-19 VITALS — BP 124/78 | HR 60

## 2016-09-19 DIAGNOSIS — Q762 Congenital spondylolisthesis: Secondary | ICD-10-CM | POA: Diagnosis not present

## 2016-09-19 DIAGNOSIS — M4316 Spondylolisthesis, lumbar region: Secondary | ICD-10-CM

## 2016-09-19 DIAGNOSIS — E11610 Type 2 diabetes mellitus with diabetic neuropathic arthropathy: Secondary | ICD-10-CM | POA: Diagnosis not present

## 2016-09-19 DIAGNOSIS — G5713 Meralgia paresthetica, bilateral lower limbs: Secondary | ICD-10-CM | POA: Diagnosis not present

## 2016-09-19 NOTE — Progress Notes (Deleted)
Pain was worse for about a week after the injection. It is now the same as it was before the injection. No relief.

## 2016-09-20 NOTE — Progress Notes (Signed)
Juan Ray - 56 y.o. male MRN [id number]  Date of birth: [birth year]  Office Visit Note: Visit Date: 09/19/2016 PCP: Susy Frizzle, MD Referred by: Susy Frizzle, MD  Subjective: Chief Complaint  Patient presents with  . Lower Back - Pain   HPI: Juan Ray is a 56 year old gentleman who was sent here by his primary care physician Dr. Dennard Schaumann. For evaluation and management of his bilateral thigh pain which is felt to maybe be related to the lumbar spine. His brief history is that he does have significant diabetes along with significant complications. He has had recent coronary artery bypass surgery and immediately after surgery he was having thigh pain bilaterally which was anterior from the top of the thigh to the knee. The symptoms did not go past the knee. It was clearly anterior pain and numbness and tingling and a burning sensation. This pain was very nervelike. He was treated with medication management initially. He was then seen by Dr. Jaynee Eagles and electrodiagnostic study was performed on both lower extremities. They felt that this was related possibly to his lumbar spine but he also had a polyneuropathy. Lumbar spine MRI was completed. This showed some mild findings along with bilateral pars defects but without great listhesis or stenosis. We decided after evaluating him to complete diagnostic L5 transforaminal epidural injections. He comes in today with no relief from those injections at all. He says initially axial but increased back pain but it did not increase his thigh pain. He continues to have bilateral thigh pain anteriorly but it is a lot less than when this first started. He does take gabapentin 600 mg twice a day. He has had no focal weakness and no weakness on exam. He's had no weakness of the lower musculature. He does have some ankle pain that is sporadic on the right side. He does get some swelling in the lower extremities. We reviewed the history of events and symptoms  with him today extensively. Please review below my review with the patient of his electrodiagnostic study and MRI.    Review of Systems  Constitutional: Negative for chills, fever, malaise/fatigue and weight loss.  HENT: Negative for hearing loss and sinus pain.   Eyes: Negative for blurred vision, double vision and photophobia.  Respiratory: Negative for cough and shortness of breath.   Cardiovascular: Negative for chest pain, palpitations and leg swelling.  Gastrointestinal: Negative for abdominal pain, nausea and vomiting.  Genitourinary: Negative for flank pain.  Musculoskeletal: Positive for back pain. Negative for myalgias.       Bilateral thigh pain  Skin: Negative for itching and rash.  Neurological: Positive for tingling. Negative for tremors, focal weakness and weakness.  Endo/Heme/Allergies: Negative.   Psychiatric/Behavioral: Negative for depression.  All other systems reviewed and are negative.  Otherwise per HPI.  Assessment & Plan: Visit Diagnoses:  1. Meralgia paresthetica of both lower extremities   2. Spondylolisthesis of lumbar region   3. Congenital spondylolysis   4. Type 2 diabetes mellitus with diabetic neuropathic arthropathy, without long-term current use of insulin ([place])     Plan: Findings:  Reviewing the electrodiagnostic study performed by Dr. Jaynee Eagles on 06/20/2016. This study impression was sensorimotor polyneuropathy likely due to diabetes but also a lumbar radiculopathy. Unfortunately electrodiagnostic studies are sometimes difficult to interpret based on the ability to localize the problem and there is underlying conditions. I personally reviewed the electrodiagnostic study again today and there were no needle EMG findings in the lower muscles only needle  EMG findings in the lumbar paraspinal musculature. Isolated lumbar denervation changes are very nonspecific and is a patient gets older may just be present without true radicular symptoms. They did feel like  they were more acute however. Reviewing his MRI this does not really fit with electrodiagnostic findings. His MRI is fairly benign except for shallow disc bulging and these pars defects at L5-S1 but without real focal compression. Now the patient has had bilateral L5 transforaminal epidural steroid injection with no relief. When the electrodiagnostic study was performed for the differential diagnoses was meralgia paresthetica. This would actually fit more with his symptoms in the legs. Again reviewing the electrodiagnostic study they did complete sensory nerve conductions of the lateral femoral cutaneous nerves bilaterally. It appears to me looking at the data that these were not able to be obtained. It is hard to obtain these values in general. Looking at the other findings this could be looked at as part of his peripheral polyneuropathy. However it does mask the fact that he could have some damage to the lateral femoral cutaneous nerves which is just not showing up on this current electrodiagnostic study. This is partly related to technical and anatomic artifact inherent in the test itself.  Given that the patient seemed to have increased symptoms after his surgery which was a coronary artery bypass grafting. Given the fact that this is been 4 months and he has shown steady improvement although is still present and his lumbar spine MRI does not have any lesions that she had this particular pattern I do think this is meralgia paresthetica.  I discussed this at length with the patient and his daughter. At this point I think we should increase his gabapentin to the midday dose of that he is having gabapentin 3 times a day. We talked about the natural history of recovery of these nerves and they do tend to heal over time but it does take quite a while. He is having a lot less symptoms in the thighs but the burning sensation that he had initially. I also discussed with him taking over-the-counter vitamin B complex.  For now we'll see him back as needed but I don't think this is really a lumbar spine issue. I do think with increasing time he will have decreasing symptoms from what I feel like his the meralgia paresthetica. These should heal to some degree but this may be reduced due to his significant diabetes.    Meds & Orders: No orders of the defined types were placed in this encounter.  No orders of the defined types were placed in this encounter.   Follow-up: Return if symptoms worsen or fail to improve.   Procedures: No procedures performed  No notes on file   Clinical History: MRI LUMBAR SPINE WITHOUT CONTRAST  TECHNIQUE: Multiplanar, multisequence MR imaging of the lumbar spine was performed. No intravenous contrast was administered.  COMPARISON:  CT abdomen and pelvis 07/28/2015 and 06/19/2011.  FINDINGS: Segmentation:  Standard.  Alignment: Trace anterolisthesis L5 on S1 is due to chronic bilateral L5 pars interarticularis defects.  Vertebrae: No fracture or worrisome marrow lesion. Scattered small Schmorl's nodes noted.  Conus medullaris: Extends to the T12 level and appears normal.  Paraspinal and other soft tissues: Negative.  Disc levels:  T10-11, T11-12 and T12-L1 are imaged in the sagittal plane only. A small central protrusion is seen at T11-12 and there is minimal disc bulging at T10-11 and T12-L1. The central canal and foramina are open at these levels.  L1-2:  Negative.  L2-3:  The disc is desiccated with a minimal bulge.  No stenosis.  L3-4:  Very shallow disc bulge without stenosis.  L4-5: Shallow disc bulge without stenosis. Mild facet degenerative disease is more notable on the right.  L5-S1: The disc is slightly uncovered. The central canal is widely patent and the foramina are open.  IMPRESSION: Negative for central canal or foraminal narrowing.  Chronic bilateral L5 pars interarticularis defects result in trace anterolisthesis L5  on S1.   Electronically Signed   By: Inge Rise M.D.   On: 06/22/2016 12:18  He reports that he quit smoking about 18 years ago. He quit after 20.00 years of use. He quit smokeless tobacco use about 18 years ago.   Recent Labs  01/27/16 1211 04/19/16 1110 07/06/16 1255  HGBA1C 10.2* 7.8* 5.7*    Objective:  VS:  HT:    WT:   BMI:     BP:124/78  HR:60bpm  TEMP: ( )  RESP:  Physical Exam  Constitutional: He is oriented to person, place, and time. He appears well-developed and well-nourished. No distress.  HENT:  Head: Normocephalic and atraumatic.  Nose: Nose normal.  Mouth/Throat: Oropharynx is clear and moist.  Eyes: Pupils are equal, round, and reactive to light. Conjunctivae are normal.  Neck: Normal range of motion. Neck supple.  Cardiovascular: Regular rhythm and intact distal pulses.   Pulmonary/Chest: Effort normal and breath sounds normal. No respiratory distress.  Abdominal: Soft. He exhibits no distension.  Musculoskeletal: He exhibits no deformity.  Patient ambulates without aid. He has some pain with extension of lumbar spine. Has no pain over the greater trochanters. He has no pain with hip rotation. He has good strength in all musculature of the lower extremities bilaterally play is no clonus bilaterally. Examination of the right ankle shows no deformity no instability. Really no tenderness to touch. He does have subjective decreased sensation to light touch in a pattern of the lateral femoral cutaneous nerve bilaterally.  Neurological: He is alert and oriented to person, place, and time. He exhibits normal muscle tone. Coordination normal.  Skin: Skin is warm and dry. No rash noted. No erythema.  Psychiatric: He has a normal mood and affect. His behavior is normal.  Nursing note and vitals reviewed.   Ortho Exam Imaging: No results found.  Past Medical/Family/Surgical/Social History: Medications & Allergies reviewed per EMR Patient Active Problem  List   Diagnosis Date Noted  . S/P CABG x 4 04/23/2016  . Abnormal stress test 04/13/2016  . Chest pain 03/11/2016  . Ischemic cardiomyopathy 07/02/2013  . Midsternal chest pain 07/11/2012  . ST elevation myocardial infarction (STEMI) of inferoposterior wall ([place]) 04/09/2012  . CAD (coronary artery disease), native coronary artery 04/09/2012  . PUD (peptic ulcer disease) 04/09/2012  . Epigastric pain 06/18/2011  . HTN (hypertension) 06/18/2011  . DM (diabetes mellitus) ([place]) 06/18/2011  . Hyperlipidemia 06/18/2011   Past Medical History:  Diagnosis Date  . Anxiety   . Arthritis   . CAD (coronary artery disease) 04/07/2012   Inferoposterior STEMI s/p DES-mid LCx  . Diabetes mellitus   . Diabetic neuropathy ([place])   . GERD (gastroesophageal reflux disease)   . High cholesterol   . Hypertension   . Myocardial infarction ([place])   . Obesity   . Pancreatitis    a. mild by CT 06/2011  . Peptic ulcer disease    a. 06/2009 EGD: multiple gastric and duodenal ulcers.   Family History  Problem  Relation Age of Onset  . Heart attack Father        MI x 2 in late 50's, currently 36's  . CVA Sister        late 22s  . CAD Maternal Uncle        MI at 55   Past Surgical History:  Procedure Laterality Date  . CORONARY ANGIOPLASTY WITH STENT PLACEMENT  04/07/2012   70% prox LAD, 70-80% ostial diagonal, 70% mid-distal LAD, 80% prox OM1, mid LCx totally occluded just distal to OM1 s/p DES, occluded RCA; severe inferior HK, LVEF 45%  . CORONARY ARTERY BYPASS GRAFT N/A 04/23/2016   Procedure: CORONARY ARTERY BYPASS GRAFTING (CABG) x 4;  Surgeon: Gaye Pollack, MD;  Location: [facility];  Service: Open Heart Surgery;  Laterality: N/A;  . ENDOVEIN HARVEST OF GREATER SAPHENOUS VEIN Right 04/23/2016   Procedure: ENDOVEIN HARVEST OF GREATER SAPHENOUS VEIN;  Surgeon: Gaye Pollack, MD;  Location: [facility];  Service: Open Heart Surgery;  Laterality: Right;  . ESOPHAGOGASTRODUODENOSCOPY  06/18/2011   NL  ESOPHAGUS/UlcerATED LESIONS/ SUPERFICIAL Ulcers  . INTRAVASCULAR PRESSURE WIRE/FFR STUDY N/A 04/13/2016   Procedure: Intravascular Pressure Wire/FFR Study;  Surgeon: Nelva Bush, MD;  Location: [facility];  Service: Cardiovascular;  Laterality: N/A;  . LEFT HEART CATH AND CORONARY ANGIOGRAPHY N/A 04/13/2016   Procedure: Left Heart Cath and Coronary Angiography;  Surgeon: Nelva Bush, MD;  Location: [facility];  Service: Cardiovascular;  Laterality: N/A;  . LEFT HEART CATHETERIZATION WITH CORONARY ANGIOGRAM N/A 04/07/2012   Procedure: LEFT HEART CATHETERIZATION WITH CORONARY ANGIOGRAM;  Surgeon: Sherren Mocha, MD;  Location[facility] CATH LAB;  Service: Cardiovascular;  Laterality: N/A;  . PERCUTANEOUS CORONARY STENT INTERVENTION (PCI-S)  04/07/2012   Procedure: PERCUTANEOUS CORONARY STENT INTERVENTION (PCI-S);  Surgeon: Sherren Mocha, MD;  Location: [facility] CATH LAB;  Service: Cardiovascular;;  . TEE WITHOUT CARDIOVERSION N/A 04/23/2016   Procedure: TRANSESOPHAGEAL ECHOCARDIOGRAM (TEE);  Surgeon: Gaye Pollack, MD;  Location: [facility];  Service: Open Heart Surgery;  Laterality: N/A;   Social History   Occupational History  . truck driver    Social History Main Topics  . Smoking status: Former Smoker    Years: 20.00    Quit date: 2000  . Smokeless tobacco: Former Systems developer    Quit date: 01/01/1998  . Alcohol use No  . Drug use: No  . Sexual activity: Yes

## 2016-09-25 ENCOUNTER — Telehealth: Payer: Self-pay | Admitting: Cardiovascular Disease

## 2016-09-25 NOTE — Telephone Encounter (Signed)
New message         [facility] Pre-operative Risk Assessment    Request for surgical clearance:  What type of surgery is being performed?  Excision of mass on forehead 1. When is this surgery scheduled?  Pending clearance  2. Are there any medications that need to be held prior to surgery and how long? Cardiac clearance; can they use sm amount of epinephrine in the lidocaine and stop baby aspirin 1 week prior to procedure?  3. Name of physician performing surgery?  Dr Crissie Reese  What is your office phone and fax number?  Fax [phone]-------clearance faxed weeks ago Earnestine Mealing 09/25/2016, 8:54 AM  _________________________________________________________________   (provider comments below)

## 2016-09-25 NOTE — Telephone Encounter (Signed)
This is fine. Pt is at low cardiac risk. thanks

## 2016-09-26 NOTE — Telephone Encounter (Signed)
Forwarded to Dr. Harlow Mares at fax [phone]

## 2016-10-10 ENCOUNTER — Telehealth: Payer: Self-pay | Admitting: Family Medicine

## 2016-10-10 MED ORDER — OXYCODONE HCL 5 MG PO TABS: 5.0000 mg | ORAL_TABLET | ORAL | 0 refills | Status: DC | PRN

## 2016-10-10 NOTE — Telephone Encounter (Signed)
Patient requesting a refill on Oxycodone - Ok to refill??       LRF 09/07/16

## 2016-10-10 NOTE — Telephone Encounter (Signed)
Pt received rx during childs ov

## 2016-10-10 NOTE — Telephone Encounter (Signed)
Okay 

## 2016-11-05 ENCOUNTER — Other Ambulatory Visit: Payer: Self-pay | Admitting: Family Medicine

## 2016-11-05 ENCOUNTER — Other Ambulatory Visit: Payer: Self-pay | Admitting: Internal Medicine

## 2016-11-05 NOTE — Telephone Encounter (Signed)
Please review for refill, thanks ! 

## 2016-11-08 ENCOUNTER — Telehealth: Payer: Self-pay | Admitting: Family Medicine

## 2016-11-08 NOTE — Telephone Encounter (Signed)
Patient requesting a refill on Oxycodone - Ok to refill??        

## 2016-11-09 MED ORDER — OXYCODONE HCL 5 MG PO TABS: 5.0000 mg | ORAL_TABLET | ORAL | 0 refills | Status: DC | PRN

## 2016-11-09 NOTE — Telephone Encounter (Signed)
RX printed, left up front and patient aware to pick up  

## 2016-11-09 NOTE — Telephone Encounter (Signed)
ok 

## 2016-12-03 ENCOUNTER — Ambulatory Visit: Payer: BLUE CROSS/BLUE SHIELD | Admitting: Family Medicine

## 2016-12-06 ENCOUNTER — Telehealth: Payer: Self-pay

## 2016-12-06 ENCOUNTER — Encounter: Payer: Self-pay | Admitting: Physician Assistant

## 2016-12-06 NOTE — Telephone Encounter (Signed)
See MyChart message from earlier today.  Called patient to discuss symptoms. He reports when he breathes he has a cool sensation when he breathes.  He also had a couple short episodes of chest discomfort in the last 2-3 weeks, the last episode being a week ago. During the episodes, he had no other symptoms than slight chest pressure. It did not radiate. Nothing made it better or worse. He did not take NTG (he did not have it available with him at the time).  He has no vital signs to report.  He is taking his medications as directed. Scheduled patient for evaluation tomorrow at 1100 with Melina Copa, PA. He understands if he gets another episode prior to appointment to take NTG up to 3 doses. He will seek medical attention if that does not resolve CP. He was grateful for call and agrees with treatment plan.

## 2016-12-07 ENCOUNTER — Encounter: Payer: Self-pay | Admitting: Physician Assistant

## 2016-12-07 ENCOUNTER — Ambulatory Visit (INDEPENDENT_AMBULATORY_CARE_PROVIDER_SITE_OTHER): Payer: BLUE CROSS/BLUE SHIELD | Admitting: Physician Assistant

## 2016-12-07 VITALS — BP 116/64 | HR 80 | Resp 16 | Ht 69.0 in | Wt 232.8 lb

## 2016-12-07 DIAGNOSIS — I2510 Atherosclerotic heart disease of native coronary artery without angina pectoris: Secondary | ICD-10-CM

## 2016-12-07 DIAGNOSIS — I255 Ischemic cardiomyopathy: Secondary | ICD-10-CM

## 2016-12-07 DIAGNOSIS — R0602 Shortness of breath: Secondary | ICD-10-CM | POA: Diagnosis not present

## 2016-12-07 DIAGNOSIS — I10 Essential (primary) hypertension: Secondary | ICD-10-CM | POA: Diagnosis not present

## 2016-12-07 DIAGNOSIS — R609 Edema, unspecified: Secondary | ICD-10-CM

## 2016-12-07 DIAGNOSIS — R0789 Other chest pain: Secondary | ICD-10-CM | POA: Diagnosis not present

## 2016-12-07 LAB — BASIC METABOLIC PANEL
BUN/Creatinine Ratio: 16 (ref 9–20)
BUN: 15 mg/dL (ref 6–24)
CALCIUM: 9.2 mg/dL (ref 8.7–10.2)
CHLORIDE: 102 mmol/L (ref 96–106)
CO2: 24 mmol/L (ref 20–29)
Creatinine, Ser: 0.92 mg/dL (ref 0.76–1.27)
GFR, EST AFRICAN AMERICAN: 106 mL/min/{1.73_m2} (ref >59)
GFR, EST NON AFRICAN AMERICAN: 91 mL/min/{1.73_m2} (ref >59)
Glucose: 236 mg/dL — ABNORMAL HIGH (ref 65–99)
POTASSIUM: 4.1 mmol/L (ref 3.5–5.2)
Sodium: 141 mmol/L (ref 134–144)

## 2016-12-07 LAB — CBC
HEMATOCRIT: 47 % (ref 37.5–51.0)
Hemoglobin: 16 g/dL (ref 13.0–17.7)
MCH: 32 pg (ref 26.6–33.0)
MCHC: 34 g/dL (ref 31.5–35.7)
MCV: 94 fL (ref 79–97)
PLATELETS: 254 10*3/uL (ref 150–379)
RBC: 5 x10E6/uL (ref 4.14–5.80)
RDW: 13.6 % (ref 12.3–15.4)
WBC: 9.4 10*3/uL (ref 3.4–10.8)

## 2016-12-07 LAB — PRO B NATRIURETIC PEPTIDE: NT-Pro BNP: 625 pg/mL — ABNORMAL HIGH (ref 0–210)

## 2016-12-07 NOTE — Progress Notes (Signed)
Cardiology Office Note    Date:  12/07/2016  ID:  Juan Ray, DOB 08/08/1960, MRN [id number] PCP:  Susy Frizzle, MD  Cardiologist:  Dr. Burt Knack   Chief Complaint: chest discomfort, cool sensation upon breathing  History of Present Illness:  Juan Ray is a 56 y.o. male with history of CAD (STEMI 2014 s/p DES Cx, progressive disease s/p CABG 04/2016), DM c/b neuropathy, anxiety, HTN, HLD, possible ischemic cardiomyopathy, mild pancreatitis 2013, peptic ulcer disease who presents for evaluation of chest pain. He had bypass earlier this year, post-op course was unremarkable. True EF not totally clear. This was 35-40% by cath 04/13/16. 2D Echo 04/19/16 showed EF 55-60% with grade 1 DD; intraop TEE 04/23/16 showed EF 40-45% with akinesis of inferior wall. Last pertinent labs include A1C 5.7, Hgb 14.6 (07/2016), LDL 45, K 4.9, Cr 0.98 (05/2016).  Over the past 3 weeks, he has had approximately 5 episodes of an unusual chest discomfort accompanied by a cooling sensation when he breathes. He actually has a difficult time explaining how it feels - it is mostly a sharp type sensation like a jolt. He has had neuropathic issues ever since his bypass surgery. He has remained active in hunting and regular physical activity without any chest pain or exacerbation of the above symptoms. He also reports intermittent ankle edema which is not appreciable by exam today. His weight is up several lbs but he attributes this to a good Thanksgiving. He also admits to intermittently eating convenience foods.   Past Medical History:  Diagnosis Date  . Anxiety   . Arthritis   . CAD (coronary artery disease) 04/07/2012   a. Inferoposterior STEMI s/p DES-mid LCx. b. s/p CABG 04/2016.  . Diabetes mellitus   . Diabetic neuropathy ([place])   . GERD (gastroesophageal reflux disease)   . High cholesterol   . Hypertension   . Ischemic cardiomyopathy    a. EF not totally clear - in 04/2016 was 35-40% by cath, 55-60% by  echo, 40-45% by TEE all around same time.  . Myocardial infarction ([place])   . Obesity   . Pancreatitis    a. mild by CT 06/2011  . Peptic ulcer disease    a. 06/2009 EGD: multiple gastric and duodenal ulcers.    Past Surgical History:  Procedure Laterality Date  . CORONARY ANGIOPLASTY WITH STENT PLACEMENT  04/07/2012   70% prox LAD, 70-80% ostial diagonal, 70% mid-distal LAD, 80% prox OM1, mid LCx totally occluded just distal to OM1 s/p DES, occluded RCA; severe inferior HK, LVEF 45%  . CORONARY ARTERY BYPASS GRAFT N/A 04/23/2016   Procedure: CORONARY ARTERY BYPASS GRAFTING (CABG) x 4;  Surgeon: Gaye Pollack, MD;  Location: [place] [facility];  Service: Open Heart Surgery;  Laterality: N/A;  . ENDOVEIN HARVEST OF GREATER SAPHENOUS VEIN Right 04/23/2016   Procedure: ENDOVEIN HARVEST OF GREATER SAPHENOUS VEIN;  Surgeon: Gaye Pollack, MD;  Location: [place] [place];  Service: Open Heart Surgery;  Laterality: Right;  . ESOPHAGOGASTRODUODENOSCOPY  06/18/2011   NL ESOPHAGUS/UlcerATED LESIONS/ SUPERFICIAL Ulcers  . INTRAVASCULAR PRESSURE WIRE/FFR STUDY N/A 04/13/2016   Procedure: Intravascular Pressure Wire/FFR Study;  Surgeon: Nelva Bush, MD;  Location: [place] [facility];  Service: Cardiovascular;  Laterality: N/A;  . LEFT HEART CATH AND CORONARY ANGIOGRAPHY N/A 04/13/2016   Procedure: Left Heart Cath and Coronary Angiography;  Surgeon: Nelva Bush, MD;  Location: [place] [facility];  Service: Cardiovascular;  Laterality: N/A;  . LEFT HEART CATHETERIZATION WITH CORONARY ANGIOGRAM N/A 04/07/2012  Procedure: LEFT HEART CATHETERIZATION WITH CORONARY ANGIOGRAM;  Surgeon: Sherren Mocha, MD;  [facility] CATH LAB;  Service: Cardiovascular;  Laterality: N/A;  . PERCUTANEOUS CORONARY STENT INTERVENTION (PCI-S)  04/07/2012   Procedure: PERCUTANEOUS CORONARY STENT INTERVENTION (PCI-S);  Surgeon: Sherren Mocha, MD;  Location: [facility] CATH LAB;  Service: Cardiovascular;;  . TEE WITHOUT CARDIOVERSION N/A 04/23/2016   Procedure:  TRANSESOPHAGEAL ECHOCARDIOGRAM (TEE);  Surgeon: Gaye Pollack, MD;  Location: [place];  Service: Open Heart Surgery;  Laterality: N/A;    Current Medications: Current Meds  Medication Sig  . aspirin EC 81 MG EC tablet Take 1 tablet (81 mg total) by mouth daily.  Marland Kitchen atorvastatin (LIPITOR) 40 MG tablet TAKE 1 TABLET BY MOUTH ONCE DAILY  . empagliflozin (JARDIANCE) 25 MG TABS tablet Take 25 mg by mouth daily.  Marland Kitchen escitalopram (LEXAPRO) 10 MG tablet TAKE 1 TABLET BY MOUTH ONCE DAILY  . gabapentin (NEURONTIN) 600 MG tablet Take 1.5 tablets (900 mg total) by mouth 3 (three) times daily.  . metFORMIN (GLUCOPHAGE) 1000 MG tablet TAKE 1 TABLET BY MOUTH TWICE DAILY  . metoprolol tartrate (LOPRESSOR) 25 MG tablet Take 1 tablet (25 mg total) by mouth 2 (two) times daily.  . nitroGLYCERIN (NITROSTAT) 0.4 MG SL tablet Place 1 tablet (0.4 mg total) under the tongue every 5 (five) minutes as needed for chest pain. X 3 doses  . oxyCODONE (OXY IR/ROXICODONE) 5 MG immediate release tablet Take 1-2 tablets (5-10 mg total) every 3 (three) hours as needed by mouth for severe pain.  . pantoprazole (PROTONIX) 40 MG tablet Take 1 tablet (40 mg total) by mouth daily.  . [DISCONTINUED] docusate sodium (COLACE) 100 MG capsule Take 200 mg by mouth at bedtime.     Allergies:   No known allergies   Social History   Socioeconomic History  . Marital status: Married    Spouse name: None  . Number of children: 2  . Years of education: None  . Highest education level: None  Social Needs  . Financial resource strain: None  . Food insecurity - worry: None  . Food insecurity - inability: None  . Transportation needs - medical: None  . Transportation needs - non-medical: None  Occupational History  . Occupation: truck Geophysicist/field seismologist  Tobacco Use  . Smoking status: Former Smoker    Years: 20.00    Last attempt to quit: 2000    Years since quitting: 18.9  . Smokeless tobacco: Former Systems developer    Quit date: 01/01/1998  Substance and  Sexual Activity  . Alcohol use: No  . Drug use: No  . Sexual activity: Yes  Other Topics Concern  . None  Social History Narrative   Lives in [place], with his wife and 2 children     Family History:  Family History  Problem Relation Age of Onset  . Heart attack Father        MI x 2 in late 50's, currently 11's  . CVA Sister        late 56s  . CAD Maternal Uncle        MI at 49    ROS:   Please see the history of present illness.  All other systems are reviewed and otherwise negative.    PHYSICAL EXAM:   VS:  BP 116/64   Pulse 80   Resp 16   Ht 5\' 9"  (1.753 m)   Wt 232 lb 12.8 oz (105.6 kg)   SpO2 99%   BMI 34.38 kg/m  BMI: Body mass index is 34.38 kg/m. GEN: Well nourished, well developed WM, in no acute distress  HEENT: normocephalic, atraumatic Neck: no JVD, carotid bruits, or masses Cardiac: RRR; no murmurs, rubs, or gallops, no edema  Respiratory:  clear to auscultation bilaterally, normal work of breathing GI: soft, nontender, nondistended, + BS MS: no deformity or atrophy  Skin: warm and dry, no rash Neuro:  Alert and Oriented x 3, Strength and sensation are intact, follows commands Psych: euthymic mood, full affect  Wt Readings from Last 3 Encounters:  12/07/16 232 lb 12.8 oz (105.6 kg)  08/27/16 223 lb 3.2 oz (101.2 kg)  07/31/16 218 lb (98.9 kg)      Studies/Labs Reviewed:   EKG:  EKG was ordered today and personally reviewed by me and demonstrates NSR 73bpm, LAFB, cannot rule out prior anteiror infarct  Recent Labs: 03/11/2016: TSH 0.354 04/24/2016: Magnesium 1.9 05/01/2016: ALT 50; BUN 16; Creat 0.98; Potassium 4.9; Sodium 135 07/06/2016: Hemoglobin 14.6; Platelets 275   Lipid Panel    Component Value Date/Time   CHOL 96 05/01/2016 0856   TRIG 144 05/01/2016 0856   HDL 22 (L) 05/01/2016 0856   CHOLHDL 4.4 05/01/2016 0856   VLDL 29 05/01/2016 0856   LDLCALC 45 05/01/2016 0856    Additional studies/ records that were reviewed today  include: Summarized above    ASSESSMENT & PLAN:   1. Atypical chest pain  - atypical, no angina with exertion. I suspect neuropathic etiology based on his description of other atypical pains since bypass surgery. I reviewed with Dr. Angelena Form (DOD) in clinic. EKG is stable. Given lack of convincing signs of ischemia would continue to follow closely for now. Not tachycardic, tachypneic or hypoxic. He does not have pleuritic CP with inspiration. No signs of DVT on exam. Will update standard bloodwork including BMET, CBC, BNP. Warning sx reviewed with patient. 2. Lower extremity edema (in context of ICM) - not appreciable by exam today. However, given his history of LV dysfunction and dietary indiscretion it is certainly feasible that he could have had some volume overload. Will check BNP with labs today. BP is on the low-normal side so will not add any new meds until this is reviewed. He does admit to drinking excess fluid as well lately (water, Crystal light). Reviewed 2g sodium restriction, 2L fluid restriction with patient. 3. CAD - as above. Continue present regimen. 4. HTN - controlled.  Disposition: F/u with Dr. Enriqueta Shutter team APP in 6 weeks.  Medication Adjustments/Labs and Tests Ordered: Current medicines are reviewed at length with the patient today.  Concerns regarding medicines are outlined above. Medication changes, Labs and Tests ordered today are summarized above and listed in the Patient Instructions accessible in Encounters.   Signed, Charlie Pitter, PA-C  12/07/2016 11:24 AM    [facility] [address], [place], [place]  [address] Phone: [phone]; Fax: [phone]

## 2016-12-07 NOTE — Patient Instructions (Signed)
Medication Instructions:  Your physician recommends that you continue on your current medications as directed. Please refer to the Current Medication list given to you today.  Labwork: TODAY:  BMET, CBC, & PRO BNP  Testing/Procedures: None ordered  Follow-Up: Your physician recommends that you schedule a follow-up appointment in: [place] APP ON A DAY DR. Burt Knack IS IN THE OFFICE   Any Other Special Instructions Will Be Listed Below (If Applicable).     If you need a refill on your cardiac medications before your next appointment, please call your pharmacy.

## 2016-12-08 ENCOUNTER — Telehealth: Payer: Self-pay | Admitting: Physician Assistant

## 2016-12-08 ENCOUNTER — Other Ambulatory Visit: Payer: Self-pay | Admitting: Physician Assistant

## 2016-12-08 MED ORDER — FUROSEMIDE 20 MG PO TABS: ORAL_TABLET | ORAL | 1 refills | Status: DC

## 2016-12-08 NOTE — Telephone Encounter (Signed)
See lab result note on labs yesterday. Attempted to call patient to discuss but 1st number goes to VM without box set up and second number goes to VM which was full. Sent [employer]. Luxe Cuadros PA-C

## 2016-12-26 ENCOUNTER — Other Ambulatory Visit: Payer: Self-pay | Admitting: Plastic Surgery

## 2016-12-26 DIAGNOSIS — D170 Benign lipomatous neoplasm of skin and subcutaneous tissue of head, face and neck: Secondary | ICD-10-CM | POA: Diagnosis not present

## 2016-12-26 DIAGNOSIS — D481 Neoplasm of uncertain behavior of connective and other soft tissue: Secondary | ICD-10-CM | POA: Diagnosis not present

## 2016-12-30 ENCOUNTER — Other Ambulatory Visit: Payer: Self-pay | Admitting: Family Medicine

## 2016-12-31 ENCOUNTER — Encounter: Payer: Self-pay | Admitting: Family Medicine

## 2016-12-31 MED ORDER — OXYCODONE HCL 5 MG PO TABS: 5.0000 mg | ORAL_TABLET | ORAL | 0 refills | Status: DC | PRN

## 2016-12-31 NOTE — Telephone Encounter (Signed)
Patient requesting a refill on Oxycodone     LOV: 07/31/16  LRF:   11/09/16

## 2017-01-18 ENCOUNTER — Ambulatory Visit: Payer: BLUE CROSS/BLUE SHIELD | Admitting: Physician Assistant

## 2017-01-22 DIAGNOSIS — I5032 Chronic diastolic (congestive) heart failure: Secondary | ICD-10-CM | POA: Insufficient documentation

## 2017-01-22 NOTE — Progress Notes (Deleted)
Cardiology Office Note:    Date:  01/22/2017   ID:  Juan Ray, DOB October 02, 1960, MRN [id number]  PCP:  Susy Frizzle, MD  Cardiologist:  Sherren Mocha, MD   Referring MD: Susy Frizzle, MD   No chief complaint on file. ***  History of Present Illness:    Juan Ray is a 57 y.o. male with a hx of CAD, DM2, HTN, HL, PUD. He suffered an inf-post STEMI 04/2012 treated with Promus DES to [facility]. Residual disease treated medically. He then underwent CABG in 04/2016 after presenting with worsening angina and a Cardiac Catheterization demonstrated 3 v CAD.  He was last seen by Melina Copa, PA-C in 12/18.  He reported chest pain at that time that was atypical.  He also noted edema in his lower extremities.  A BNP was slightly elevated and he was given low dose Lasix x 3 days.   Juan Ray returns for follow up.  ***  Prior CV studies:   The following studies were reviewed today:  Cardiac Catheterization 04/13/16 LAD ost 50, prox 70, mid 70, dist 30 LCx prox 30, mid 50 ISR, dist 80, OM2 90 RCA mid 100 w/ L>R collats EF 35-40  Echo 04/19/16 Mod conc LVH, EF 55-60, inf AK, Gr 1 DD  Nuclear stress test 03/16/16 Inf/inf-lat infarct, small ant infarct with mild peri-infarct ischemia, EF < 30; high risk to scar burden and low EF  LHC (4/14):  pLAD 70%, oDx 70-80%, mid to dist LAD 70%, pOM1 80%, mCFX occluded, RCA with CTO, inf HK, EF 45%. PCI: Promus DES to [place]. Residual disease treated medically.   Echo (11/2012):  Mild LVH, EF 40-45%, inf and post AK, Gr 1 DD, mild LAE  Myoview (4/14) done b/c of residual CAD:  intermediate risk, inf-lat scar, no ischemia, EF 39%. Med Rx continued.   Past Medical History:  Diagnosis Date  . Anxiety   . Arthritis   . CAD (coronary artery disease) 04/07/2012   a. Inferoposterior STEMI s/p DES-mid LCx. b. s/p CABG 04/2016.  . Diabetes mellitus   . Diabetic neuropathy ([place])   . GERD (gastroesophageal reflux disease)   . High  cholesterol   . Hypertension   . Ischemic cardiomyopathy    a. EF not totally clear - in 04/2016 was 35-40% by cath, 55-60% by echo, 40-45% by TEE all around same time.  . Myocardial infarction ([place])   . Obesity   . Pancreatitis    a. mild by CT 06/2011  . Peptic ulcer disease    a. 06/2009 EGD: multiple gastric and duodenal ulcers.    Past Surgical History:  Procedure Laterality Date  . CORONARY ANGIOPLASTY WITH STENT PLACEMENT  04/07/2012   70% prox LAD, 70-80% ostial diagonal, 70% mid-distal LAD, 80% prox OM1, mid LCx totally occluded just distal to OM1 s/p DES, occluded RCA; severe inferior HK, LVEF 45%  . CORONARY ARTERY BYPASS GRAFT N/A 04/23/2016   Procedure: CORONARY ARTERY BYPASS GRAFTING (CABG) x 4;  Surgeon: Gaye Pollack, MD;  Location: [place] [facility];  Service: Open Heart Surgery;  Laterality: N/A;  . ENDOVEIN HARVEST OF GREATER SAPHENOUS VEIN Right 04/23/2016   Procedure: ENDOVEIN HARVEST OF GREATER SAPHENOUS VEIN;  Surgeon: Gaye Pollack, MD;  Location: [place];  Service: Open Heart Surgery;  Laterality: Right;  . ESOPHAGOGASTRODUODENOSCOPY  06/18/2011   NL ESOPHAGUS/UlcerATED LESIONS/ SUPERFICIAL Ulcers  . INTRAVASCULAR PRESSURE WIRE/FFR STUDY N/A 04/13/2016   Procedure: Intravascular Pressure Wire/FFR Study;  Surgeon: Nelva Bush, MD;  Location: [place] [facility];  Service: Cardiovascular;  Laterality: N/A;  . LEFT HEART CATH AND CORONARY ANGIOGRAPHY N/A 04/13/2016   Procedure: Left Heart Cath and Coronary Angiography;  Surgeon: Nelva Bush, MD;  Location: [place] [facility];  Service: Cardiovascular;  Laterality: N/A;  . LEFT HEART CATHETERIZATION WITH CORONARY ANGIOGRAM N/A 04/07/2012   Procedure: LEFT HEART CATHETERIZATION WITH CORONARY ANGIOGRAM;  Surgeon: Sherren Mocha, MD;  Location: [facility] CATH LAB;  Service: Cardiovascular;  Laterality: N/A;  . PERCUTANEOUS CORONARY STENT INTERVENTION (PCI-S)  04/07/2012   Procedure: PERCUTANEOUS CORONARY STENT INTERVENTION (PCI-S);  Surgeon:  Sherren Mocha, MD;  Location: [facility] CATH LAB;  Service: Cardiovascular;;  . TEE WITHOUT CARDIOVERSION N/A 04/23/2016   Procedure: TRANSESOPHAGEAL ECHOCARDIOGRAM (TEE);  Surgeon: Gaye Pollack, MD;  Location: [place];  Service: Open Heart Surgery;  Laterality: N/A;    Current Medications: No outpatient medications have been marked as taking for the 01/23/17 encounter (Appointment) with Richardson Dopp T, PA-C.     Allergies:   No known allergies   Social History   Tobacco Use  . Smoking status: Former Smoker    Years: 20.00    Last attempt to quit: 2000    Years since quitting: 19.0  . Smokeless tobacco: Former Systems developer    Quit date: 01/01/1998  Substance Use Topics  . Alcohol use: No  . Drug use: No     Family Hx: The patient's family history includes CAD in his maternal uncle; CVA in his sister; Heart attack in his father.  ROS:   Please see the history of present illness.    ROS All other systems reviewed and are negative.   EKGs/Labs/Other Test Reviewed:    EKG:  EKG is *** ordered today.  The ekg ordered today demonstrates ***  Recent Labs: 03/11/2016: TSH 0.354 04/24/2016: Magnesium 1.9 05/01/2016: ALT 50 12/07/2016: BUN 15; Creatinine, Ser 0.92; Hemoglobin 16.0; NT-Pro BNP 625; Platelets 254; Potassium 4.1; Sodium 141   Recent Lipid Panel Lab Results  Component Value Date/Time   CHOL 96 05/01/2016 08:56 AM   TRIG 144 05/01/2016 08:56 AM   HDL 22 (L) 05/01/2016 08:56 AM   CHOLHDL 4.4 05/01/2016 08:56 AM   LDLCALC 45 05/01/2016 08:56 AM    Physical Exam:    VS:  There were no vitals taken for this visit.    Wt Readings from Last 3 Encounters:  12/07/16 232 lb 12.8 oz (105.6 kg)  08/27/16 223 lb 3.2 oz (101.2 kg)  07/31/16 218 lb (98.9 kg)     ***Physical Exam  ASSESSMENT:    1. Coronary artery disease involving native coronary artery of native heart without angina pectoris   2. Chronic diastolic CHF (congestive heart failure) ([place])   3. Essential hypertension     4. Hyperlipidemia, unspecified hyperlipidemia type    PLAN:    In order of problems listed above:  Coronary artery disease involving native coronary artery of native heart without angina pectoris  Chronic diastolic CHF (congestive heart failure) ([place])  Essential hypertension  Hyperlipidemia, unspecified hyperlipidemia type*** 1. CAD -  s/p inf-post STEMI tx with DES to [facility] in 04/2012.  He has been off of medications for the past 2 months. He lost his insurance and cannot afford his medications. I could not find any samples that would be adequate substitutes for his medications. I reviewed the [facility] and [facility] $4 lists. I have tried to adjust his medications to make a more affordable.  I have also recommended he look into  a website called [employer] to purchase his Plavix (Clopidogrel can be purchased for $10.95 per month).             -  Continue ASA, Plavix.             -  Resume Lisinopril 2.5 mg QD, Metoprolol tartrate 12.5 mg bid             -  Change Lipitor to Pravastatin 40 mg QHS             -  FU with me or Dr. Sherren Mocha in 2-3 weeks.   2. Ischemic cardiomyopathy: Continue beta blocker, ACEI. EF stable by echo in 11/2012.  3. Essential hypertension: Controlled.    4. Hyperlipidemia: As noted, Lipitor changed to Pravastatin (on $4 list at both [facility] and [facility]).    5. Diabetes - Will try to get him into [facility] or [facility] to help with FU and getting affordable medications. I will refill his Metformin for him to help with his Diabetes.    6. Chest pain - CP is atypical for ischemia. ECG is unchanged. Suspect symptoms are related to GERD.               -  Start Pepcid 20 mg bid ($4 list)             -  Resume cardiac meds             -  If symptoms persist, consider FU stress testing  7. Weight loss - Suspect this is related to increased activity.  As noted, will try to get him into PCP.  Check BMET, CBC,  LFTs.   Dispo:  No Follow-up on file.   Medication Adjustments/Labs and Tests Ordered: Current medicines are reviewed at length with the patient today.  Concerns regarding medicines are outlined above.  Tests Ordered: No orders of the defined types were placed in this encounter.  Medication Changes: No orders of the defined types were placed in this encounter.   Signed, Richardson Dopp, PA-C  01/22/2017 10:21 PM    [facility] [place], [place], [place]  [address] Phone: [phone]; Fax: [phone]

## 2017-01-23 ENCOUNTER — Ambulatory Visit: Payer: BLUE CROSS/BLUE SHIELD | Admitting: Physician Assistant

## 2017-01-31 ENCOUNTER — Encounter: Payer: Self-pay | Admitting: Physician Assistant

## 2017-02-18 ENCOUNTER — Other Ambulatory Visit: Payer: Self-pay | Admitting: Family Medicine

## 2017-02-19 MED ORDER — OXYCODONE HCL 5 MG PO TABS: 5.0000 mg | ORAL_TABLET | ORAL | 0 refills | Status: DC | PRN

## 2017-02-19 NOTE — Telephone Encounter (Signed)
Patient requesting a refill on Oxycodone     LOV:  07/31/16  LRF:    12/31/16

## 2017-03-08 ENCOUNTER — Encounter: Payer: Self-pay | Admitting: Family Medicine

## 2017-03-08 ENCOUNTER — Telehealth: Payer: Self-pay | Admitting: Cardiovascular Disease

## 2017-03-08 NOTE — Telephone Encounter (Signed)
Patient request letter for the [employer] stating he may return to work with no restrictions. Patient missed 6 week OV 1/23. Scheduled patient for OV with Dr. Burt Knack Monday 3/11. He understands he will be given letter at that time if ok with Dr. Burt Knack. He was grateful for call and agrees with treatment plan.

## 2017-03-08 NOTE — Telephone Encounter (Signed)
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 Please call to discuss.

## 2017-03-11 ENCOUNTER — Encounter: Payer: Self-pay | Admitting: Cardiovascular Disease

## 2017-03-11 ENCOUNTER — Encounter: Payer: Self-pay | Admitting: Family Medicine

## 2017-03-11 ENCOUNTER — Ambulatory Visit: Payer: BLUE CROSS/BLUE SHIELD | Admitting: Cardiovascular Disease

## 2017-03-11 VITALS — BP 126/80 | HR 88 | Ht 69.0 in | Wt 240.8 lb

## 2017-03-11 DIAGNOSIS — I10 Essential (primary) hypertension: Secondary | ICD-10-CM

## 2017-03-11 DIAGNOSIS — I2510 Atherosclerotic heart disease of native coronary artery without angina pectoris: Secondary | ICD-10-CM

## 2017-03-11 NOTE — Progress Notes (Signed)
Cardiology Office Note Date:  03/11/2017   ID:  Juan Ray, DOB February 05, 1960, MRN [id number]  PCP:  Susy Frizzle, MD  Cardiologist:  Sherren Mocha, MD    Chief Complaint  Patient presents with  . Follow-up    cad     History of Present Illness: Juan Ray is a 57 y.o. male who presents for follow-up of coronary artery disease.  The patient initially presented in 2014 with an inferolateral STEMI and was treated with drug-eluting stent placement in the left circumflex.  He ultimately underwent CABG in 2018 for progressive multivessel disease.  He was treated with LIMA-LAD, SVG-OM1 and OM2, and SVG-RCA. Other problems include hypertension, hyperlipidemia.  The patient reports no cardiac-related symptoms today. Today, he denies symptoms of palpitations, chest pain, shortness of breath, orthopnea, PND, lower extremity edema, dizziness, or syncope. He is compliant with his medications. Not exercising regularly. When seen last he had lost a good deal of weight but has gained much of it back.   Past Medical History:  Diagnosis Date  . Anxiety   . Arthritis   . CAD (coronary artery disease) 04/07/2012   a. Inferoposterior STEMI s/p DES-mid LCx. b. s/p CABG 04/2016.  . Diabetes mellitus   . Diabetic neuropathy ([place])   . GERD (gastroesophageal reflux disease)   . High cholesterol   . Hypertension   . Ischemic cardiomyopathy    a. EF not totally clear - in 04/2016 was 35-40% by cath, 55-60% by echo, 40-45% by TEE all around same time.  . Myocardial infarction ([place])   . Obesity   . Pancreatitis    a. mild by CT 06/2011  . Peptic ulcer disease    a. 06/2009 EGD: multiple gastric and duodenal ulcers.    Past Surgical History:  Procedure Laterality Date  . CORONARY ANGIOPLASTY WITH STENT PLACEMENT  04/07/2012   70% prox LAD, 70-80% ostial diagonal, 70% mid-distal LAD, 80% prox OM1, mid LCx totally occluded just distal to OM1 s/p DES, occluded RCA; severe inferior HK, LVEF 45%  .  CORONARY ARTERY BYPASS GRAFT N/A 04/23/2016   Procedure: CORONARY ARTERY BYPASS GRAFTING (CABG) x 4;  Surgeon: Gaye Pollack, MD;  Location: [facility];  Service: Open Heart Surgery;  Laterality: N/A;  . ENDOVEIN HARVEST OF GREATER SAPHENOUS VEIN Right 04/23/2016   Procedure: ENDOVEIN HARVEST OF GREATER SAPHENOUS VEIN;  Surgeon: Gaye Pollack, MD;  Location: [facility];  Service: Open Heart Surgery;  Laterality: Right;  . ESOPHAGOGASTRODUODENOSCOPY  06/18/2011   NL ESOPHAGUS/UlcerATED LESIONS/ SUPERFICIAL Ulcers  . INTRAVASCULAR PRESSURE WIRE/FFR STUDY N/A 04/13/2016   Procedure: Intravascular Pressure Wire/FFR Study;  Surgeon: Nelva Bush, MD;  Location: [facility];  Service: Cardiovascular;  Laterality: N/A;  . LEFT HEART CATH AND CORONARY ANGIOGRAPHY N/A 04/13/2016   Procedure: Left Heart Cath and Coronary Angiography;  Surgeon: Nelva Bush, MD;  Location: [facility];  Service: Cardiovascular;  Laterality: N/A;  . LEFT HEART CATHETERIZATION WITH CORONARY ANGIOGRAM N/A 04/07/2012   Procedure: LEFT HEART CATHETERIZATION WITH CORONARY ANGIOGRAM;  Surgeon: Sherren Mocha, MD;  Location: [facility] CATH LAB;  Service: Cardiovascular;  Laterality: N/A;  . PERCUTANEOUS CORONARY STENT INTERVENTION (PCI-S)  04/07/2012   Procedure: PERCUTANEOUS CORONARY STENT INTERVENTION (PCI-S);  Surgeon: Sherren Mocha, MD;  Location: [facility] CATH LAB;  Service: Cardiovascular;;  . TEE WITHOUT CARDIOVERSION N/A 04/23/2016   Procedure: TRANSESOPHAGEAL ECHOCARDIOGRAM (TEE);  Surgeon: Gaye Pollack, MD;  Location: [facility];  Service: Open Heart Surgery;  Laterality: N/A;  Current Outpatient Medications  Medication Sig Dispense Refill  . aspirin EC 81 MG EC tablet Take 1 tablet (81 mg total) by mouth daily.    Marland Kitchen atorvastatin (LIPITOR) 40 MG tablet TAKE 1 TABLET BY MOUTH ONCE DAILY 30 tablet 6  . empagliflozin (JARDIANCE) 25 MG TABS tablet Take 25 mg by mouth daily. 30 tablet 11  . escitalopram (LEXAPRO) 10 MG tablet TAKE 1  TABLET BY MOUTH ONCE DAILY 90 tablet 3  . gabapentin (NEURONTIN) 600 MG tablet TAKE 1 TABLET BY MOUTH THREE TIMES DAILY 90 tablet 5  . lisinopril (PRINIVIL,ZESTRIL) 2.5 MG tablet Take 2.5 mg by mouth daily.  3  . metFORMIN (GLUCOPHAGE) 1000 MG tablet TAKE 1 TABLET BY MOUTH TWICE DAILY 60 tablet 3  . metoprolol tartrate (LOPRESSOR) 25 MG tablet Take 1 tablet (25 mg total) by mouth 2 (two) times daily. 180 tablet 2  . nitroGLYCERIN (NITROSTAT) 0.4 MG SL tablet Place 1 tablet (0.4 mg total) under the tongue every 5 (five) minutes as needed for chest pain. X 3 doses 25 tablet prn  . oxyCODONE (OXY IR/ROXICODONE) 5 MG immediate release tablet Take 1-2 tablets (5-10 mg total) by mouth every 3 (three) hours as needed for severe pain. 60 tablet 0  . pantoprazole (PROTONIX) 40 MG tablet Take 1 tablet (40 mg total) by mouth daily. 30 tablet 11   No current facility-administered medications for this visit.     Allergies:   No known allergies   Social History:  The patient  reports that he quit smoking about 19 years ago. He quit after 20.00 years of use. He quit smokeless tobacco use about 19 years ago. He reports that he does not drink alcohol or use drugs.   Family History:  The patient's  family history includes CAD in his maternal uncle; CVA in his sister; Heart attack in his father.    ROS:  Please see the history of present illness.  Otherwise, review of systems is positive for snoring.  All other systems are reviewed and negative.    PHYSICAL EXAM: VS:  BP 126/80   Pulse 88   Ht 5\' 9"  (1.753 m)   Wt 240 lb 12.8 oz (109.2 kg)   BMI 35.56 kg/m  , BMI Body mass index is 35.56 kg/m. GEN: Well nourished, well developed, in no acute distress  HEENT: normal  Neck: no JVD, no masses. No carotid bruits Cardiac: RRR without murmur or gallop                Respiratory:  clear to auscultation bilaterally, normal work of breathing GI: soft, nontender, nondistended, + BS MS: no deformity or atrophy   Ext: no pretibial edema, pedal pulses 2+= bilaterally Skin: warm and dry, no rash Neuro:  Strength and sensation are intact Psych: euthymic mood, full affect  EKG:  EKG is ordered today. The ekg ordered today shows normal sinus rhythm 88 bpm, left anterior fascicular block, age-indeterminate inferolateral MI.  Recent Labs: 04/24/2016: Magnesium 1.9 05/01/2016: ALT 50 12/07/2016: BUN 15; Creatinine, Ser 0.92; Hemoglobin 16.0; NT-Pro BNP 625; Platelets 254; Potassium 4.1; Sodium 141   Lipid Panel     Component Value Date/Time   CHOL 96 05/01/2016 0856   TRIG 144 05/01/2016 0856   HDL 22 (L) 05/01/2016 0856   CHOLHDL 4.4 05/01/2016 0856   VLDL 29 05/01/2016 0856   LDLCALC 45 05/01/2016 0856      Wt Readings from Last 3 Encounters:  03/11/17 240 lb 12.8 oz (109.2  kg)  12/07/16 232 lb 12.8 oz (105.6 kg)  08/27/16 223 lb 3.2 oz (101.2 kg)     Cardiac Studies Reviewed: Cardiac Catheterization 04/13/2016: Conclusion   Conclusions: 1. Significant 3-vessel coronary artery disease, including sequential 50-70% ostial, proximal, and mid LAD stenoses, which are hemodynamically significant (resting Pd/Pa 0.72), 50% in-stent restenosis in mid LCx as well as 80% 90% stenoses involving OM2 and distal LCx, and chronic total occlusion of mid RCA with distal vessel filling via bridging and left-to-right collaterals. 2. Upper normal left ventricular filling pressure. 3. Moderately reduced left ventricular contraction with inferior akinesis (LVEF 35-40%).  Recommendations: 1. Cardiac surgery consultation for CABG, given significant 3-vessel coronary artery disease, reduced LV function, and diabetes mellitus. 2. Aggressive secondary prevention, including escalation of statin therapy. 3. Start isosorbide mononitrate 30 mg daily; if patient experiences side effects, he can decrease the dose to 15 mg daily. 4. Proceed with transthoracic echocardiogram, as previously ordered.   Echo 04-19-2016: Study  Conclusions  - Left ventricle: The cavity size was normal. There was moderate   concentric hypertrophy. Systolic function was normal. The   estimated ejection fraction was in the range of 55% to 60%. There   is akinesis of the inferior myocardium. Doppler parameters are   consistent with abnormal left ventricular relaxation (grade 1   diastolic dysfunction). - Aortic valve: Trileaflet; mildly thickened, moderately calcified   leaflets.  ASSESSMENT AND PLAN: 1.  CAD, native vessel, without angina: pt is stable on current Rx. Medications are reviewed and no changes are made today.   ASA for antiplatelet Rx  lisinipril (DM/CAD)  High intensity statin  2. Type II DM: managed by Dr Dennard Schaumann. We discussed lifestyle modification - dietary measures, exercise, weight loss.   3. Morbid obesity BMI >35 in setting DM/HTN. Counseling as above  4. Hyperlipidemia: treated with atorvastatin 40 mg  DOT letter today. No cardiac contraindication to driving.    Current medicines are reviewed with the patient today.  The patient does not have concerns regarding medicines.  Labs/ tests ordered today include:   Orders Placed This Encounter  Procedures  . EKG 12-Lead    Disposition:   FU 6 months  Signed, Sherren Mocha, MD  03/11/2017 5:32 PM    [address], [place], [place]  [address] Phone: [phone]; Fax: [phone]

## 2017-03-11 NOTE — Patient Instructions (Signed)

## 2017-03-20 ENCOUNTER — Other Ambulatory Visit: Payer: Self-pay | Admitting: Family Medicine

## 2017-03-29 ENCOUNTER — Ambulatory Visit: Payer: BLUE CROSS/BLUE SHIELD | Admitting: Physician Assistant

## 2017-04-01 ENCOUNTER — Other Ambulatory Visit: Payer: Self-pay | Admitting: Family Medicine

## 2017-04-01 NOTE — Telephone Encounter (Signed)
Patient requesting a refill on Oxycodone     LOV: 07/31/16  LRF:  02/19/17

## 2017-04-02 MED ORDER — OXYCODONE HCL 5 MG PO TABS: 5.0000 mg | ORAL_TABLET | ORAL | 0 refills | Status: DC | PRN

## 2017-04-05 ENCOUNTER — Ambulatory Visit: Payer: BLUE CROSS/BLUE SHIELD | Admitting: Family Medicine

## 2017-04-05 ENCOUNTER — Encounter: Payer: Self-pay | Admitting: Family Medicine

## 2017-04-05 VITALS — BP 110/58 | HR 72 | Temp 98.0°F | Resp 18 | Ht 69.0 in | Wt 240.0 lb

## 2017-04-05 DIAGNOSIS — M79641 Pain in right hand: Secondary | ICD-10-CM | POA: Diagnosis not present

## 2017-04-05 DIAGNOSIS — I2510 Atherosclerotic heart disease of native coronary artery without angina pectoris: Secondary | ICD-10-CM | POA: Diagnosis not present

## 2017-04-05 DIAGNOSIS — I10 Essential (primary) hypertension: Secondary | ICD-10-CM

## 2017-04-05 DIAGNOSIS — M79642 Pain in left hand: Secondary | ICD-10-CM

## 2017-04-05 DIAGNOSIS — E1142 Type 2 diabetes mellitus with diabetic polyneuropathy: Secondary | ICD-10-CM

## 2017-04-05 DIAGNOSIS — IMO0002 Reserved for concepts with insufficient information to code with codable children: Secondary | ICD-10-CM

## 2017-04-05 DIAGNOSIS — E1165 Type 2 diabetes mellitus with hyperglycemia: Secondary | ICD-10-CM | POA: Diagnosis not present

## 2017-04-05 DIAGNOSIS — Z1211 Encounter for screening for malignant neoplasm of colon: Secondary | ICD-10-CM | POA: Diagnosis not present

## 2017-04-05 DIAGNOSIS — Z951 Presence of aortocoronary bypass graft: Secondary | ICD-10-CM | POA: Diagnosis not present

## 2017-04-05 MED ORDER — DICLOFENAC SODIUM 1 % TD GEL
2.0000 g | Freq: Four times a day (QID) | TRANSDERMAL | 3 refills | Status: DC
Start: 2017-04-05 — End: 2017-04-08

## 2017-04-05 MED ORDER — TADALAFIL 20 MG PO TABS: 10.0000 mg | ORAL_TABLET | ORAL | 11 refills | Status: DC | PRN

## 2017-04-05 MED ORDER — TADALAFIL 20 MG PO TABS: 10.0000 mg | ORAL_TABLET | ORAL | 0 refills | Status: DC | PRN

## 2017-04-05 NOTE — Addendum Note (Signed)
Addended by: Shary Decamp B on: 04/05/2017 01:05 PM   Modules accepted: Orders

## 2017-04-05 NOTE — Progress Notes (Signed)
Subjective:    Patient ID: Juan Ray, male    DOB: 02-12-1960, 57 y.o.   MRN: [id number]  HPI Patient has not been seen since July of last year.  He is not regularly checking his blood sugars.  He states that when he does check it it can be high.  He denies any hypoglycemic episodes.  He made the visit today because of joint pains.  However I have asked if we can check his diabetes lab work while the patient is here and he agrees.  He denies any polyuria, polydipsia, or blurry vision.  He denies any chest pain shortness of breath, or dyspnea on exertion.  He does report bilateral joint pains in both hands.  It involves the MCP and PIP joints on both hands.  There is no visible disfigurement.  There is no erythema or warmth or joint effusions.  He has normal range of motion.  However he states that the joints on both hands are extremely stiff every morning when he wakes up and it takes quite a bit of time before he is able to flex and extend his fingers.  He also complains of bilateral knee pain located in the joints.  Primarily this is after he has been squatting or sitting for a long period of time.  He also reports erectile dysfunction.  He would like a prescription for Cialis that he could take as needed.  In the past, he is to try 20 mg Cialis and took half of it.  He would like a prescription for 100.  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  And candidate he can buy a pill for $2 and 70 since.  That is why he would like 100 pills at a time because he may make only one run to [place] every year and this would allow him to get as many pills as he possibly could at one time.   Past Medical History:  Diagnosis Date  . Anxiety   . Arthritis   . CAD (coronary artery disease) 04/07/2012   a. Inferoposterior STEMI s/p DES-mid LCx. b. s/p CABG 04/2016.  . Diabetes mellitus   . Diabetic neuropathy ([place])   . GERD (gastroesophageal reflux disease)   . High cholesterol   . Hypertension    . Ischemic cardiomyopathy    a. EF not totally clear - in 04/2016 was 35-40% by cath, 55-60% by echo, 40-45% by TEE all around same time.  . Myocardial infarction ([place])   . Obesity   . Pancreatitis    a. mild by CT 06/2011  . Peptic ulcer disease    a. 06/2009 EGD: multiple gastric and duodenal ulcers.   Past Surgical History:  Procedure Laterality Date  . CORONARY ANGIOPLASTY WITH STENT PLACEMENT  04/07/2012   70% prox LAD, 70-80% ostial diagonal, 70% mid-distal LAD, 80% prox OM1, mid LCx totally occluded just distal to OM1 s/p DES, occluded RCA; severe inferior HK, LVEF 45%  . CORONARY ARTERY BYPASS GRAFT N/A 04/23/2016   Procedure: CORONARY ARTERY BYPASS GRAFTING (CABG) x 4;  Surgeon: Gaye Pollack, MD;  Location: [place] [facility] OR;  Service: Open Heart Surgery;  Laterality: N/A;  . ENDOVEIN HARVEST OF GREATER SAPHENOUS VEIN Right 04/23/2016   Procedure: ENDOVEIN HARVEST OF GREATER SAPHENOUS VEIN;  Surgeon: Gaye Pollack, MD;  Location: [place];  Service: Open Heart Surgery;  Laterality: Right;  . ESOPHAGOGASTRODUODENOSCOPY  06/18/2011   NL ESOPHAGUS/UlcerATED LESIONS/ SUPERFICIAL Ulcers  . INTRAVASCULAR PRESSURE  WIRE/FFR STUDY N/A 04/13/2016   Procedure: Intravascular Pressure Wire/FFR Study;  Surgeon: Nelva Bush, MD;  Location: [place] [facility];  Service: Cardiovascular;  Laterality: N/A;  . LEFT HEART CATH AND CORONARY ANGIOGRAPHY N/A 04/13/2016   Procedure: Left Heart Cath and Coronary Angiography;  Surgeon: Nelva Bush, MD;  Location: [facility];  Service: Cardiovascular;  Laterality: N/A;  . LEFT HEART CATHETERIZATION WITH CORONARY ANGIOGRAM N/A 04/07/2012   Procedure: LEFT HEART CATHETERIZATION WITH CORONARY ANGIOGRAM;  Surgeon: Sherren Mocha, MD;  Location: [facility] CATH LAB;  Service: Cardiovascular;  Laterality: N/A;  . PERCUTANEOUS CORONARY STENT INTERVENTION (PCI-S)  04/07/2012   Procedure: PERCUTANEOUS CORONARY STENT INTERVENTION (PCI-S);  Surgeon: Sherren Mocha, MD;  Location: [facility]  CATH LAB;  Service: Cardiovascular;;  . TEE WITHOUT CARDIOVERSION N/A 04/23/2016   Procedure: TRANSESOPHAGEAL ECHOCARDIOGRAM (TEE);  Surgeon: Gaye Pollack, MD;  Location: [facility];  Service: Open Heart Surgery;  Laterality: N/A;   Current Outpatient Medications on File Prior to Visit  Medication Sig Dispense Refill  . aspirin EC 81 MG EC tablet Take 1 tablet (81 mg total) by mouth daily.    Marland Kitchen atorvastatin (LIPITOR) 40 MG tablet TAKE 1 TABLET BY MOUTH ONCE DAILY 30 tablet 6  . empagliflozin (JARDIANCE) 25 MG TABS tablet Take 25 mg by mouth daily. 30 tablet 11  . escitalopram (LEXAPRO) 10 MG tablet TAKE 1 TABLET BY MOUTH ONCE DAILY 90 tablet 3  . gabapentin (NEURONTIN) 600 MG tablet TAKE 1 TABLET BY MOUTH THREE TIMES DAILY 90 tablet 5  . lisinopril (PRINIVIL,ZESTRIL) 2.5 MG tablet Take 2.5 mg by mouth daily.  3  . metFORMIN (GLUCOPHAGE) 1000 MG tablet TAKE 1 TABLET BY MOUTH TWICE DAILY 180 tablet 1  . metoprolol tartrate (LOPRESSOR) 25 MG tablet Take 1 tablet (25 mg total) by mouth 2 (two) times daily. 180 tablet 2  . nitroGLYCERIN (NITROSTAT) 0.4 MG SL tablet Place 1 tablet (0.4 mg total) under the tongue every 5 (five) minutes as needed for chest pain. X 3 doses 25 tablet prn  . oxyCODONE (OXY IR/ROXICODONE) 5 MG immediate release tablet Take 1-2 tablets (5-10 mg total) by mouth every 3 (three) hours as needed for severe pain. 60 tablet 0  . pantoprazole (PROTONIX) 40 MG tablet Take 1 tablet (40 mg total) by mouth daily. 30 tablet 11   No current facility-administered medications on file prior to visit.    Allergies  Allergen Reactions  . No Known Allergies    Social History   Socioeconomic History  . Marital status: Married    Spouse name: Not on file  . Number of children: 2  . Years of education: Not on file  . Highest education level: Not on file  Occupational History  . Occupation: truck Animator Needs  . Financial resource strain: Not on file  . Food insecurity:     Worry: Not on file    Inability: Not on file  . Transportation needs:    Medical: Not on file    Non-medical: Not on file  Tobacco Use  . Smoking status: Former Smoker    Years: 20.00    Last attempt to quit: 2000    Years since quitting: 19.2  . Smokeless tobacco: Former Systems developer    Quit date: 01/01/1998  Substance and Sexual Activity  . Alcohol use: No  . Drug use: No  . Sexual activity: Yes  Lifestyle  . Physical activity:    Days per week: Not on file  Minutes per session: Not on file  . Stress: Not on file  Relationships  . Social connections:    Talks on phone: Not on file    Gets together: Not on file    Attends religious service: Not on file    Active member of club or organization: Not on file    Attends meetings of clubs or organizations: Not on file    Relationship status: Not on file  . Intimate partner violence:    Fear of current or ex partner: Not on file    Emotionally abused: Not on file    Physically abused: Not on file    Forced sexual activity: Not on file  Other Topics Concern  . Not on file  Social History Narrative   Lives in [place], with his wife and 2 children      Review of Systems  All other systems reviewed and are negative.      Objective:   Physical Exam  Cardiovascular: Normal rate, regular rhythm and normal heart sounds.  Pulmonary/Chest: Effort normal and breath sounds normal. No respiratory distress. He has no wheezes. He has no rales.  Abdominal: Soft. Bowel sounds are normal.  Musculoskeletal: He exhibits no edema.       Right knee: He exhibits normal range of motion and no swelling. No medial joint line and no lateral joint line tenderness noted.       Left knee: He exhibits normal range of motion and no swelling. No medial joint line and no lateral joint line tenderness noted.       Right hand: He exhibits normal range of motion, no tenderness, no bony tenderness and no deformity.       Left hand: He exhibits normal range  of motion, no tenderness, no bony tenderness and no deformity.  Vitals reviewed.         Assessment & Plan:  Uncontrolled type 2 diabetes mellitus with diabetic polyneuropathy, without long-term current use of insulin ([place]) - Plan: CBC with Differential/Platelet, COMPLETE METABOLIC PANEL WITH GFR, Lipid panel, Microalbumin, urine, Hemoglobin A1c  Coronary artery disease involving native coronary artery of native heart without angina pectoris  S/P CABG x 4  Essential hypertension  Pain in both hands - Plan: Rheumatoid factor  I am concerned that the patient's diabetes is uncontrolled.  I would like to check a hemoglobin A1c to assess glycemic control.  If greater than 6.5, I would likely recommend Victoza given the potential cardiac benefit as well as the weight loss benefit.  I will check a fasting lipid panel.  Goal LDL cholesterol is less than 70.  I will also check a urine microalbumin.  His blood pressure today is adequately controlled at 110/58.  He denies any symptoms of angina.  Regarding his hand pain, I believe is most likely osteoarthritis.  I have recommended Voltaren gel 2 g 4 times a day as needed.  I explained to the patient that he cannot take NSAIDs due to his history of coronary artery disease.  This would increase his risk particular given his daily use of aspirin.  Instead I have recommended that he use the Voltaren gel which would be safer.  I will check a rheumatoid factor to evaluate for possible signs of rheumatoid arthritis.  Regarding his erectile dysfunction, he can use Cialis 10-20 mg/day as needed for erectile problems.  I cautioned the patient about using this in the same 24-hour period with nitroglycerin.  Patient states that he has not used  nitroglycerin in more than 2 years.  He would never combine the medications.  He is aware of the risk.  I am willing to give the patient 100 tablets.  I understand the logistics that driving to [place] is extremely infrequent.  He  is only going to make one trip and he would like to get as many pills at one time as he can that may last him for more than a year.  Therefore I will write him for 100 tablets with 0 refills.

## 2017-04-06 LAB — CBC WITH DIFFERENTIAL/PLATELET
BASOS ABS: 71 {cells}/uL (ref 0–200)
Basophils Relative: 0.9 %
EOS ABS: 261 {cells}/uL (ref 15–500)
EOS PCT: 3.3 %
HCT: 45.9 % (ref 38.5–50.0)
Hemoglobin: 16.1 g/dL (ref 13.2–17.1)
Lymphs Abs: 2504 cells/uL (ref 850–3900)
MCH: 32.1 pg (ref 27.0–33.0)
MCHC: 35.1 g/dL (ref 32.0–36.0)
MCV: 91.4 fL (ref 80.0–100.0)
MONOS PCT: 13.1 %
MPV: 10.8 fL (ref 7.5–12.5)
Neutro Abs: 4029 cells/uL (ref 1500–7800)
Neutrophils Relative %: 51 %
PLATELETS: 234 10*3/uL (ref 140–400)
RBC: 5.02 10*6/uL (ref 4.20–5.80)
RDW: 12.8 % (ref 11.0–15.0)
TOTAL LYMPHOCYTE: 31.7 %
WBC mixed population: 1035 cells/uL — ABNORMAL HIGH (ref 200–950)
WBC: 7.9 10*3/uL (ref 3.8–10.8)

## 2017-04-06 LAB — COMPLETE METABOLIC PANEL WITH GFR
AG Ratio: 1.7 (calc) (ref 1.0–2.5)
ALT: 24 U/L (ref 9–46)
AST: 21 U/L (ref 10–35)
Albumin: 4.1 g/dL (ref 3.6–5.1)
Alkaline phosphatase (APISO): 98 U/L (ref 40–115)
BILIRUBIN TOTAL: 0.8 mg/dL (ref 0.2–1.2)
BUN: 18 mg/dL (ref 7–25)
CHLORIDE: 101 mmol/L (ref 98–110)
CO2: 27 mmol/L (ref 20–32)
Calcium: 9.1 mg/dL (ref 8.6–10.3)
Creat: 0.84 mg/dL (ref 0.70–1.33)
GFR, Est African American: 112 mL/min/{1.73_m2} (ref >=60)
GFR, Est Non African American: 97 mL/min/{1.73_m2} (ref >=60)
GLUCOSE: 247 mg/dL — AB (ref 65–99)
Globulin: 2.4 g/dL (calc) (ref 1.9–3.7)
Potassium: 4.1 mmol/L (ref 3.5–5.3)
Sodium: 137 mmol/L (ref 135–146)
Total Protein: 6.5 g/dL (ref 6.1–8.1)

## 2017-04-06 LAB — LIPID PANEL
Cholesterol: 151 mg/dL (ref <200)
HDL: 37 mg/dL — ABNORMAL LOW (ref >40)
LDL CHOLESTEROL (CALC): 78 mg/dL
Non-HDL Cholesterol (Calc): 114 mg/dL (calc) (ref <130)
TRIGLYCERIDES: 275 mg/dL — AB (ref <150)
Total CHOL/HDL Ratio: 4.1 (calc) (ref <5.0)

## 2017-04-06 LAB — HEMOGLOBIN A1C
EAG (MMOL/L): 11.2 (calc)
Hgb A1c MFr Bld: 8.7 % of total Hgb — ABNORMAL HIGH (ref <5.7)
Mean Plasma Glucose: 203 (calc)

## 2017-04-06 LAB — MICROALBUMIN, URINE: Microalb, Ur: 0.3 mg/dL

## 2017-04-06 LAB — RHEUMATOID FACTOR

## 2017-04-08 ENCOUNTER — Telehealth: Payer: Self-pay | Admitting: Family Medicine

## 2017-04-08 MED ORDER — INSULIN PEN NEEDLE 32G X 4 MM MISC: 2 refills | Status: DC

## 2017-04-08 MED ORDER — LIRAGLUTIDE 18 MG/3ML ~~LOC~~ SOPN: PEN_INJECTOR | SUBCUTANEOUS | 3 refills | Status: DC

## 2017-04-08 MED ORDER — DICLOFENAC SODIUM 1 % TD GEL: 2.0000 g | Freq: Four times a day (QID) | TRANSDERMAL | 3 refills | Status: DC

## 2017-04-08 NOTE — Telephone Encounter (Signed)
PA Submitted through [email or web address] and received the following:  Approvedtoday  Effective from 04/08/2017 through 12/31/2037.   Pharm aware

## 2017-04-12 ENCOUNTER — Encounter: Payer: Self-pay | Admitting: Gastroenterology

## 2017-04-18 IMAGING — DX DG CHEST 2V
2 series · 2 of 2 positions shown · non-contrast
Comparison: 07/28/2015

CLINICAL DATA: Chest pain

EXAM:
CHEST  2 VIEW

[chest pa]
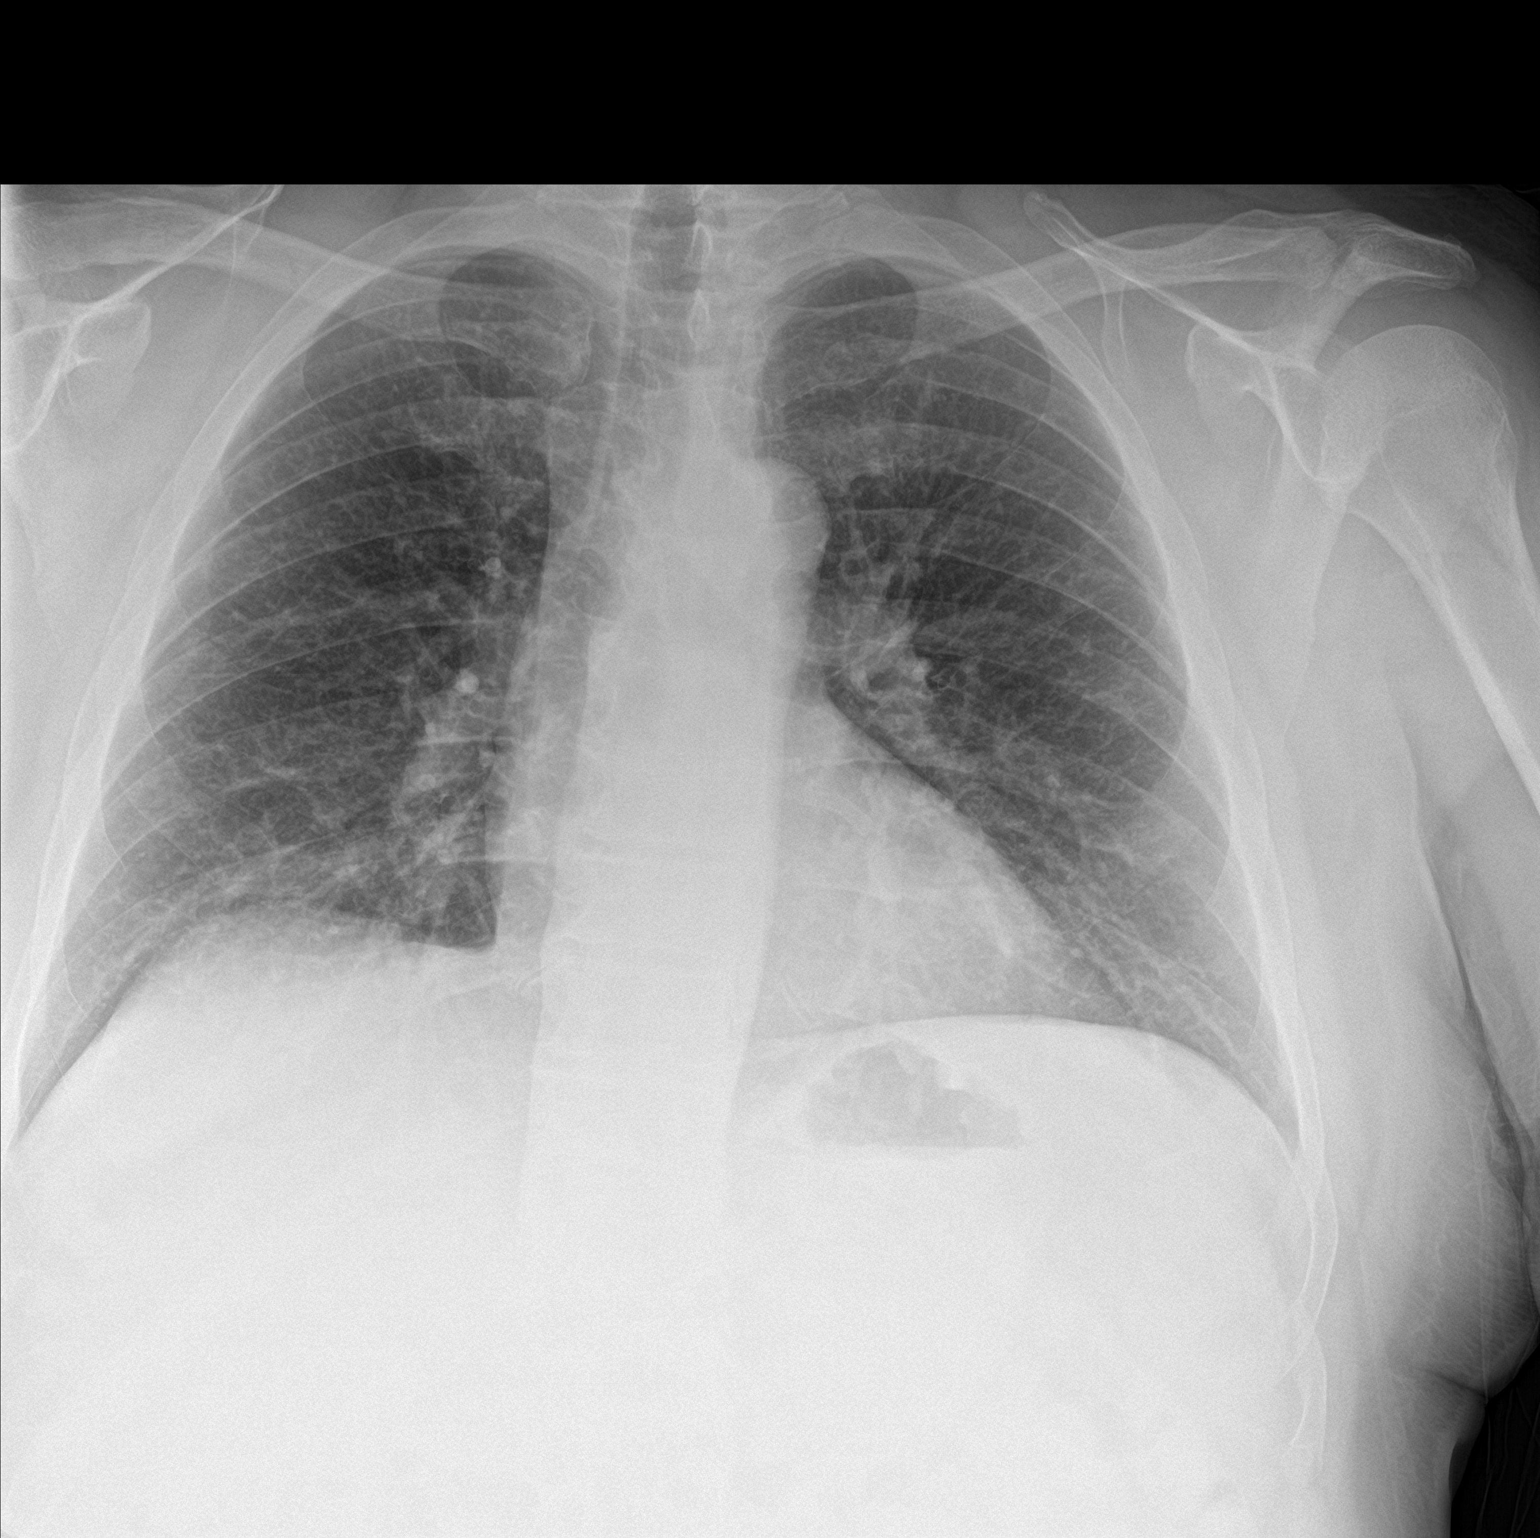

[chest lat]
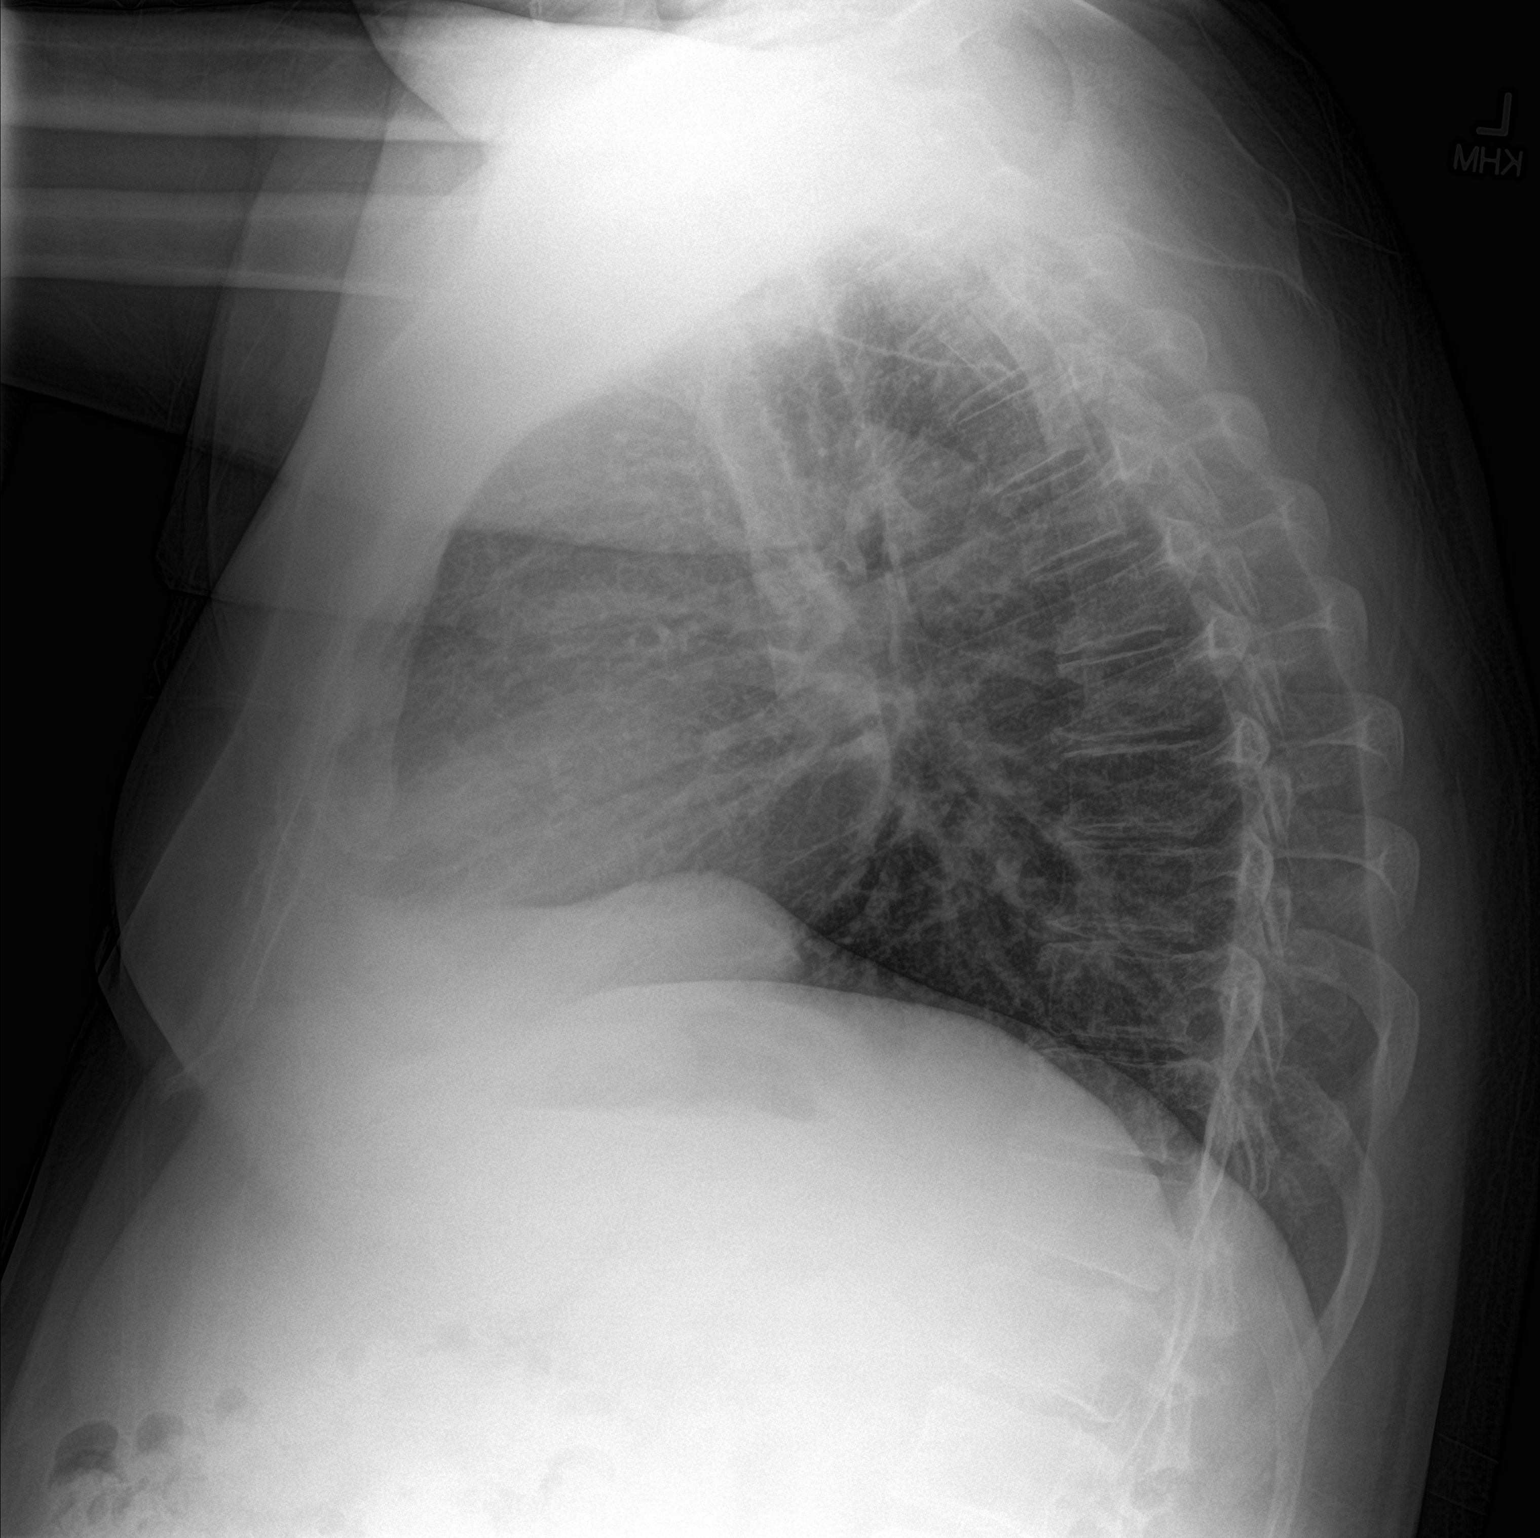

[2 of 2 positions shown; findings below may reference images not displayed]

FINDINGS: The heart size and mediastinal contours are within normal limits.
Both lungs are clear. The visualized skeletal structures are
unremarkable.
IMPRESSION: No active cardiopulmonary disease.

## 2017-04-30 ENCOUNTER — Other Ambulatory Visit: Payer: Self-pay | Admitting: Physician Assistant

## 2017-04-30 DIAGNOSIS — I10 Essential (primary) hypertension: Secondary | ICD-10-CM

## 2017-05-05 ENCOUNTER — Other Ambulatory Visit: Payer: Self-pay | Admitting: Physician Assistant

## 2017-05-08 ENCOUNTER — Encounter: Payer: Self-pay | Admitting: Family Medicine

## 2017-05-08 NOTE — Telephone Encounter (Signed)
Patient requesting a refill on Oxycodone      LRF:    04/02/17

## 2017-05-09 MED ORDER — OXYCODONE HCL 5 MG PO TABS: 5.0000 mg | ORAL_TABLET | ORAL | 0 refills | Status: DC | PRN

## 2017-06-01 IMAGING — CR DG CHEST 1V PORT
1 series · 1 of 1 positions shown · non-contrast
Comparison: April 23, 2016

CLINICAL DATA: Chest tube present. Status post coronary artery
bypass grafting

EXAM:
PORTABLE CHEST 1 VIEW

[AP]
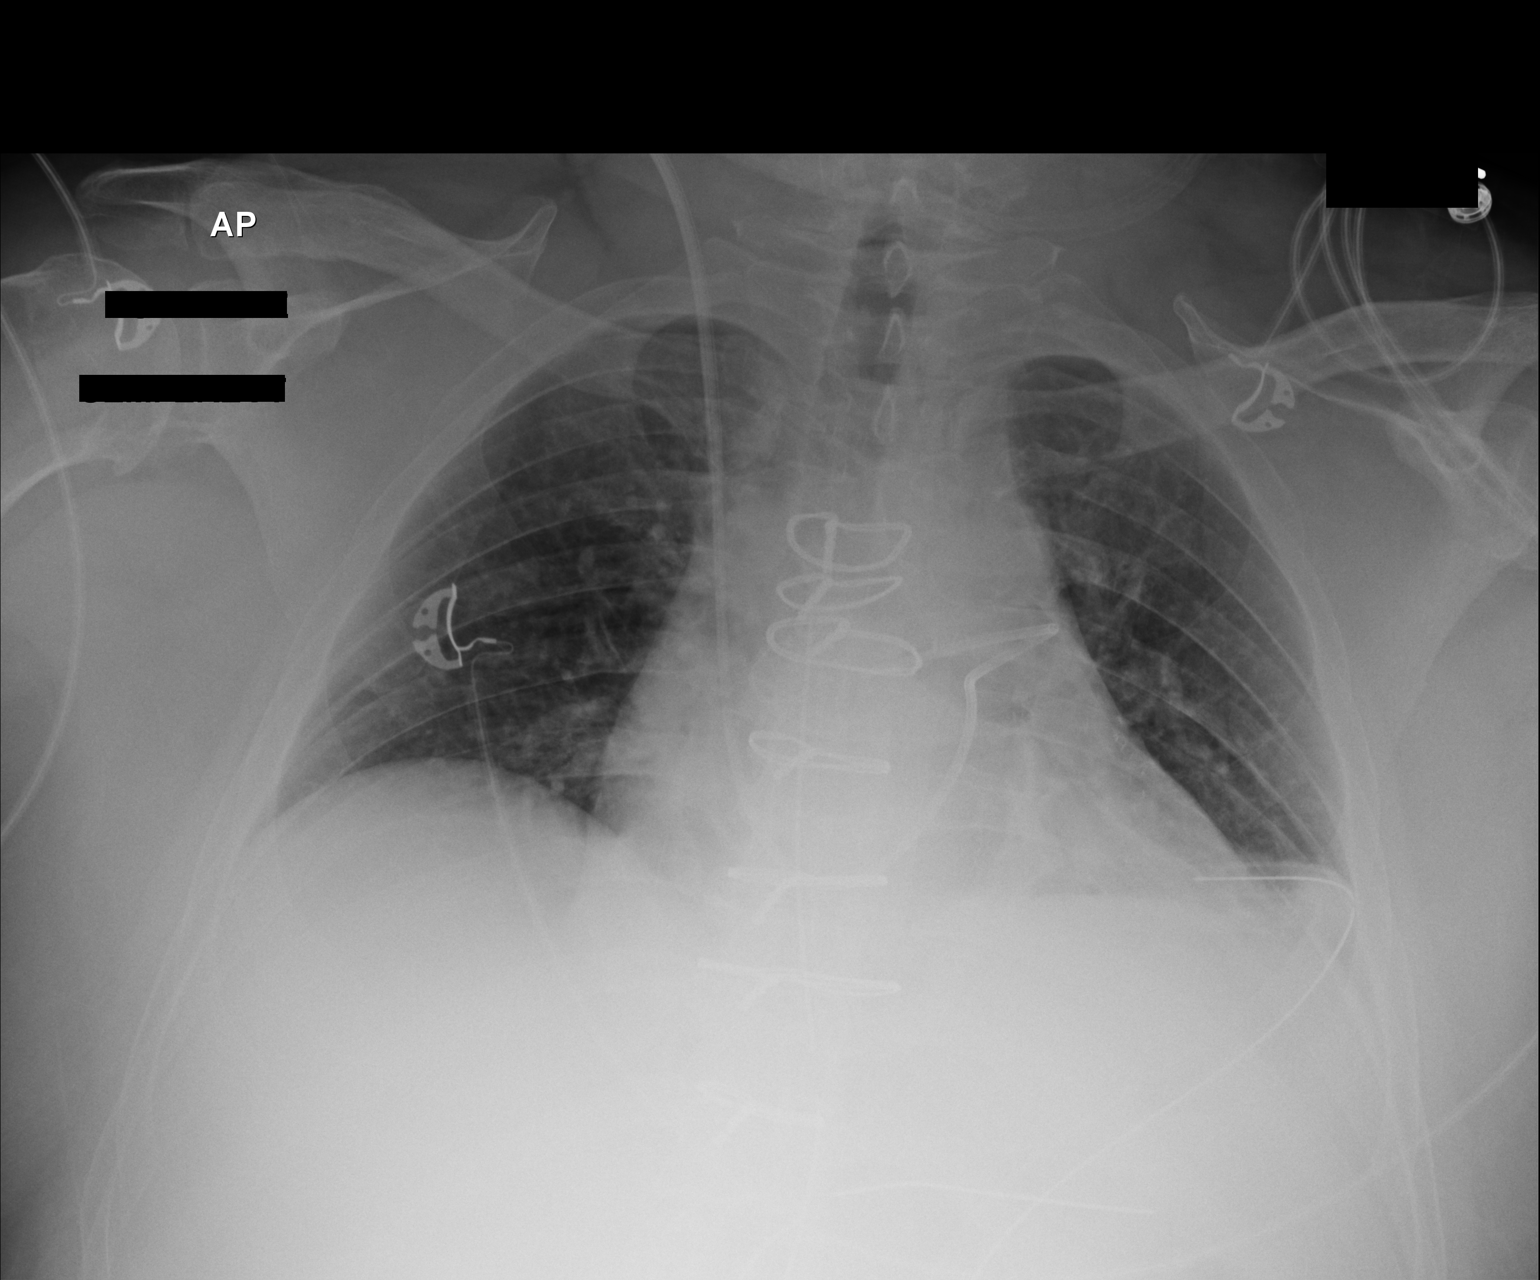

[1 of 1 positions shown; findings below may reference images not displayed]

FINDINGS: Endotracheal tube and nasogastric tube have been removed. Swan-Ganz
catheter tip is in the proximal right main pulmonary artery. There
is an acute turn in the catheter near the pulmonic valve, not
present 1 day prior. There are two left chest tubes and a
mediastinal drain present, unchanged in positions. No pneumothorax.
There is mild atelectasis in the left base. Lungs elsewhere are
clear. Heart is mildly prominent with pulmonary vascularity within
normal limits. There is aortic atherosclerosis. No bone lesions. No
adenopathy.
IMPRESSION: Tube and catheter positions as described without pneumothorax. Note
that there is a rather acute appearing turn in the Swan-Ganz
catheter near the pulmonic valve. Significance of this finding is
uncertain. This appearance has changed from 1 day prior.

Mild left base atelectasis. Lungs elsewhere clear. Stable cardiac
silhouette. There is aortic atherosclerosis.

## 2017-06-03 ENCOUNTER — Other Ambulatory Visit: Payer: Self-pay | Admitting: Family Medicine

## 2017-06-12 ENCOUNTER — Other Ambulatory Visit: Payer: Self-pay | Admitting: Cardiology

## 2017-06-12 ENCOUNTER — Other Ambulatory Visit: Payer: Self-pay | Admitting: Physician Assistant

## 2017-06-21 ENCOUNTER — Other Ambulatory Visit: Payer: Self-pay | Admitting: Family Medicine

## 2017-06-21 MED ORDER — OXYCODONE HCL 5 MG PO TABS: 5.0000 mg | ORAL_TABLET | ORAL | 0 refills | Status: DC | PRN

## 2017-06-21 NOTE — Telephone Encounter (Signed)
Ok to refill??  Last office visit 04/05/2017.  Last refill 05/09/2017.

## 2017-07-03 ENCOUNTER — Encounter: Payer: Self-pay | Admitting: Gastroenterology

## 2017-07-03 ENCOUNTER — Other Ambulatory Visit: Payer: Self-pay | Admitting: *Deleted

## 2017-07-03 ENCOUNTER — Ambulatory Visit: Payer: BLUE CROSS/BLUE SHIELD | Admitting: Gastroenterology

## 2017-07-03 ENCOUNTER — Telehealth: Payer: Self-pay | Admitting: *Deleted

## 2017-07-03 ENCOUNTER — Encounter: Payer: Self-pay | Admitting: *Deleted

## 2017-07-03 VITALS — BP 121/78 | HR 68 | Temp 97.3°F | Ht 69.0 in | Wt 246.2 lb

## 2017-07-03 DIAGNOSIS — K279 Peptic ulcer, site unspecified, unspecified as acute or chronic, without hemorrhage or perforation: Secondary | ICD-10-CM

## 2017-07-03 DIAGNOSIS — Z1211 Encounter for screening for malignant neoplasm of colon: Secondary | ICD-10-CM

## 2017-07-03 MED ORDER — PEG 3350-KCL-NA BICARB-NACL 420 G PO SOLR: 4000.0000 mL | Freq: Once | ORAL | 0 refills | Status: AC

## 2017-07-03 NOTE — Progress Notes (Addendum)
REVIEWED-NO ADDITIONAL RECOMMENDATIONS.  Primary Care Physician:  Susy Frizzle, MD Primary Gastroenterologist:  Dr. Oneida Alar   Chief Complaint  Patient presents with  . Colonoscopy    never had tcs    HPI:   Juan Ray is a 57 y.o. male presenting today to arrange screening colonoscopy. He was last seen in 2013 as an inpatient with abdominal pain and elevated lipase, normal HFP. EGD June 2013: multiple ulcers in proximal stomach with stigmata of bleeding, s/p[ biopsy, multiple superficial ulcers in duodenal bulb. Normal ampulla and second portion of duodenum. CT June 2013 with evidence of mild pancreatitis with small amount of straining to pancreatic head and several adjacent mildly enlarged lymph nodes likely reactive in nature. US abdomen without gallstones at that time. He was to have outpatient EUS but never completed.    No rectal bleeding. No constipation or diarrhea. No abdominal pain. No N/V. No unexplained weight loss or lack of appetite. No prior colonoscopy. Did not have surveillance EGD. No upper GI symptoms. No further pancreatitis attacks.   Past Medical History:  Diagnosis Date  . Anxiety   . Arthritis   . CAD (coronary artery disease) 04/07/2012   a. Inferoposterior STEMI s/p DES-mid LCx. b. s/p CABG 04/2016.  . Diabetes mellitus   . Diabetic neuropathy ([place])   . GERD (gastroesophageal reflux disease)   . High cholesterol   . Hypertension   . Ischemic cardiomyopathy    a. EF not totally clear - in 04/2016 was 35-40% by cath, 55-60% by echo, 40-45% by TEE all around same time.  . Myocardial infarction ([place])   . Obesity   . Pancreatitis    a. mild by CT 06/2011  . Peptic ulcer disease    a. 06/2009 EGD: multiple gastric and duodenal ulcers.    Past Surgical History:  Procedure Laterality Date  . CORONARY ANGIOPLASTY WITH STENT PLACEMENT  04/07/2012   70% prox LAD, 70-80% ostial diagonal, 70% mid-distal LAD, 80% prox OM1, mid LCx totally occluded just  distal to OM1 s/p DES, occluded RCA; severe inferior HK, LVEF 45%  . CORONARY ARTERY BYPASS GRAFT N/A 04/23/2016   Procedure: CORONARY ARTERY BYPASS GRAFTING (CABG) x 4;  Surgeon: Gaye Pollack, MD;  Location: [facility];  Service: Open Heart Surgery;  Laterality: N/A;  . ENDOVEIN HARVEST OF GREATER SAPHENOUS VEIN Right 04/23/2016   Procedure: ENDOVEIN HARVEST OF GREATER SAPHENOUS VEIN;  Surgeon: Gaye Pollack, MD;  Location: [facility];  Service: Open Heart Surgery;  Laterality: Right;  . ESOPHAGOGASTRODUODENOSCOPY  06/18/2011   multiple ulcers in proximal stomach with stigmata of bleeding s/p biopsy, multiple superficial ulcers in duodenal bulb. normal ampulla and second portion of duodenum  . ESOPHAGOGASTRODUODENOSCOPY  2013  . INTRAVASCULAR PRESSURE WIRE/FFR STUDY N/A 04/13/2016   Procedure: Intravascular Pressure Wire/FFR Study;  Surgeon: Nelva Bush, MD;  Location: [facility];  Service: Cardiovascular;  Laterality: N/A;  . LEFT HEART CATH AND CORONARY ANGIOGRAPHY N/A 04/13/2016   Procedure: Left Heart Cath and Coronary Angiography;  Surgeon: Nelva Bush, MD;  Location: [facility];  Service: Cardiovascular;  Laterality: N/A;  . LEFT HEART CATHETERIZATION WITH CORONARY ANGIOGRAM N/A 04/07/2012   Procedure: LEFT HEART CATHETERIZATION WITH CORONARY ANGIOGRAM;  Surgeon: Sherren Mocha, MD;  Location: [facility] CATH LAB;  Service: Cardiovascular;  Laterality: N/A;  . PERCUTANEOUS CORONARY STENT INTERVENTION (PCI-S)  04/07/2012   Procedure: PERCUTANEOUS CORONARY STENT INTERVENTION (PCI-S);  Surgeon: Sherren Mocha, MD;  Location: [facility] CATH  LAB;  Service: Cardiovascular;;  . TEE WITHOUT CARDIOVERSION N/A 04/23/2016   Procedure: TRANSESOPHAGEAL ECHOCARDIOGRAM (TEE);  Surgeon: Gaye Pollack, MD;  Location: [facility];  Service: Open Heart Surgery;  Laterality: N/A;    Current Outpatient Medications  Medication Sig Dispense Refill  . aspirin EC 81 MG EC tablet Take 1 tablet (81 mg total) by mouth daily.      Marland Kitchen atorvastatin (LIPITOR) 40 MG tablet TAKE 1 TABLET BY MOUTH ONCE DAILY 90 tablet 2  . escitalopram (LEXAPRO) 10 MG tablet TAKE 1 TABLET BY MOUTH ONCE DAILY 90 tablet 3  . gabapentin (NEURONTIN) 600 MG tablet TAKE 1 TABLET BY MOUTH THREE TIMES DAILY 90 tablet 5  . Insulin Pen Needle 32G X 4 MM MISC Use with Victoza daily 100 each 2  . JARDIANCE 25 MG TABS tablet TAKE 1 TABLET BY MOUTH ONCE DAILY 90 tablet 1  . lisinopril (PRINIVIL,ZESTRIL) 2.5 MG tablet TAKE 1 TABLET BY MOUTH ONCE DAILY 90 tablet 2  . metFORMIN (GLUCOPHAGE) 1000 MG tablet TAKE 1 TABLET BY MOUTH TWICE DAILY 180 tablet 1  . metoprolol tartrate (LOPRESSOR) 25 MG tablet TAKE 1 TABLET BY MOUTH TWICE DAILY 180 tablet 2  . nitroGLYCERIN (NITROSTAT) 0.4 MG SL tablet Place 1 tablet (0.4 mg total) under the tongue every 5 (five) minutes as needed for chest pain. X 3 doses 25 tablet prn  . oxyCODONE (OXY IR/ROXICODONE) 5 MG immediate release tablet Take 1-2 tablets (5-10 mg total) by mouth every 3 (three) hours as needed for severe pain. 60 tablet 0  . pantoprazole (PROTONIX) 40 MG tablet TAKE 1 TABLET BY MOUTH ONCE DAILY 30 tablet 11  . tadalafil (CIALIS) 20 MG tablet Take 0.5-1 tablets (10-20 mg total) by mouth every other day as needed for erectile dysfunction. 100 tablet 0   No current facility-administered medications for this visit.     Allergies as of 07/03/2017 - Review Complete 07/03/2017  Allergen Reaction Noted  . No known allergies  04/20/2016    Family History  Problem Relation Age of Onset  . Heart attack Father        MI x 2 in late 50's, currently 40's  . CVA Sister        late 19s  . CAD Maternal Uncle        MI at 75  . Colon cancer Neg Hx   . Colon polyps Neg Hx     Social History   Socioeconomic History  . Marital status: Married    Spouse name: Not on file  . Number of children: 2  . Years of education: Not on file  . Highest education level: Not on file  Occupational History  . Occupation: truck  Animator Needs  . Financial resource strain: Not on file  . Food insecurity:    Worry: Not on file    Inability: Not on file  . Transportation needs:    Medical: Not on file    Non-medical: Not on file  Tobacco Use  . Smoking status: Former Smoker    Years: 20.00    Last attempt to quit: 2000    Years since quitting: 19.5  . Smokeless tobacco: Former Systems developer    Quit date: 01/01/1998  Substance and Sexual Activity  . Alcohol use: No  . Drug use: No  . Sexual activity: Yes  Lifestyle  . Physical activity:    Days per week: Not on file    Minutes per session: Not on file  .  Stress: Not on file  Relationships  . Social connections:    Talks on phone: Not on file    Gets together: Not on file    Attends religious service: Not on file    Active member of club or organization: Not on file    Attends meetings of clubs or organizations: Not on file    Relationship status: Not on file  . Intimate partner violence:    Fear of current or ex partner: Not on file    Emotionally abused: Not on file    Physically abused: Not on file    Forced sexual activity: Not on file  Other Topics Concern  . Not on file  Social History Narrative   Lives in [place], with his wife and 2 children    Review of Systems: Gen: Denies any fever, chills, fatigue, weight loss, lack of appetite.  CV: Denies chest pain, heart palpitations, peripheral edema, syncope.  Resp: Denies shortness of breath at rest or with exertion. Denies wheezing or cough.  GI: see HPI  GU : Denies urinary burning, urinary frequency, urinary hesitancy MS: right leg pain, on chronic pain medication  Derm: Denies rash, itching, dry skin Psych: Denies depression, anxiety, memory loss, and confusion Heme: Denies bruising, bleeding, and enlarged lymph nodes.  Physical Exam: BP 121/78   Pulse 68   Temp (!) 97.3 F (36.3 C) (Oral)   Ht 5\' 9"  (1.753 m)   Wt 246 lb 3.2 oz (111.7 kg)   BMI 36.36 kg/m  General:   Alert and  oriented. Pleasant and cooperative. Well-nourished and well-developed.  Head:  Normocephalic and atraumatic. Eyes:  Without icterus, sclera clear and conjunctiva pink.  Ears:  Normal auditory acuity. Nose:  No deformity, discharge,  or lesions. Mouth:  No deformity or lesions, oral mucosa pink.  Lungs:  Clear to auscultation bilaterally. No wheezes, rales, or rhonchi. No distress.  Heart:  S1, S2 present without murmurs appreciated.  Abdomen:  +BS, soft, non-tender and non-distended. No HSM noted. No guarding or rebound. No masses appreciated.  Rectal:  Deferred  Msk:  Symmetrical without gross deformities. Normal posture. Extremities:  Without edema. Neurologic:  Alert and  oriented x4 Psych:  Alert and cooperative. Normal mood and affect.

## 2017-07-03 NOTE — Telephone Encounter (Signed)
Pre-op scheduled for 09/18/17 at 10:00am. Letter mailed. LMOVM.

## 2017-07-03 NOTE — Progress Notes (Signed)
cc'ed to pcp °

## 2017-07-03 NOTE — Patient Instructions (Addendum)
We have arranged a colonoscopy for screening purposes and an upper endoscopy due to history of peptic ulcer disease with Dr. Oneida Alar in the near future!  Continue taking Protonix once daily, 30 minutes before breakfast.  Do not take any diabetes medications the day of the procedure.  Have fun with your family on the trip!  It was a pleasure to see you today. I strive to create trusting relationships with patients to provide genuine, compassionate, and quality care. I value your feedback. If you receive a survey regarding your visit,  I greatly appreciate you taking time to fill this out.   Annitta Needs, PhD, ANP-BC [facility]

## 2017-07-03 NOTE — Assessment & Plan Note (Signed)
2013 as an inpatient with abdominal pain and elevated lipase, normal HFP. EGD June 2013: multiple ulcers in proximal stomach with stigmata of bleeding, s/p[ biopsy, multiple superficial ulcers in duodenal bulb. Normal ampulla and second portion of duodenum. No surveillance EGD. Ulcers likely secondary to NSAIDs. No upper GI symptoms. Remains on Protonix daily.  Proceed with upper endoscopy in the near future with Dr. Oneida Alar. The risks, benefits, and alternatives have been discussed in detail with patient. They have stated understanding and desire to proceed.  Propofol due to polypharmacy Prior pancreatitis not completely evaluated as per plan in 2013: was to have EUS but did not complete. Consider CT with pancreatic protocol vs MRI/MRCP. Unclear etiology at that time

## 2017-07-03 NOTE — Assessment & Plan Note (Signed)
57 year old male with need for initial screening colonoscopy. No concerning lower GI symptoms. No family history of colorectal cancer or polyps. Will need Propofol due to polypharmacy.  Proceed with TCS with Dr. Oneida Alar in near future: the risks, benefits, and alternatives have been discussed with the patient in detail. The patient states understanding and desires to proceed. Propofol due to polypharmacy

## 2017-07-07 IMAGING — DX DG CHEST 2V
2 series · 2 of 2 positions shown · non-contrast
Comparison: Portable chest x-ray April 25, 2016

CLINICAL DATA: Status post CABG 5 weeks ago. No current chest
complaints.

EXAM:
CHEST  2 VIEW

[dg chest 2 view (1 of 2)]
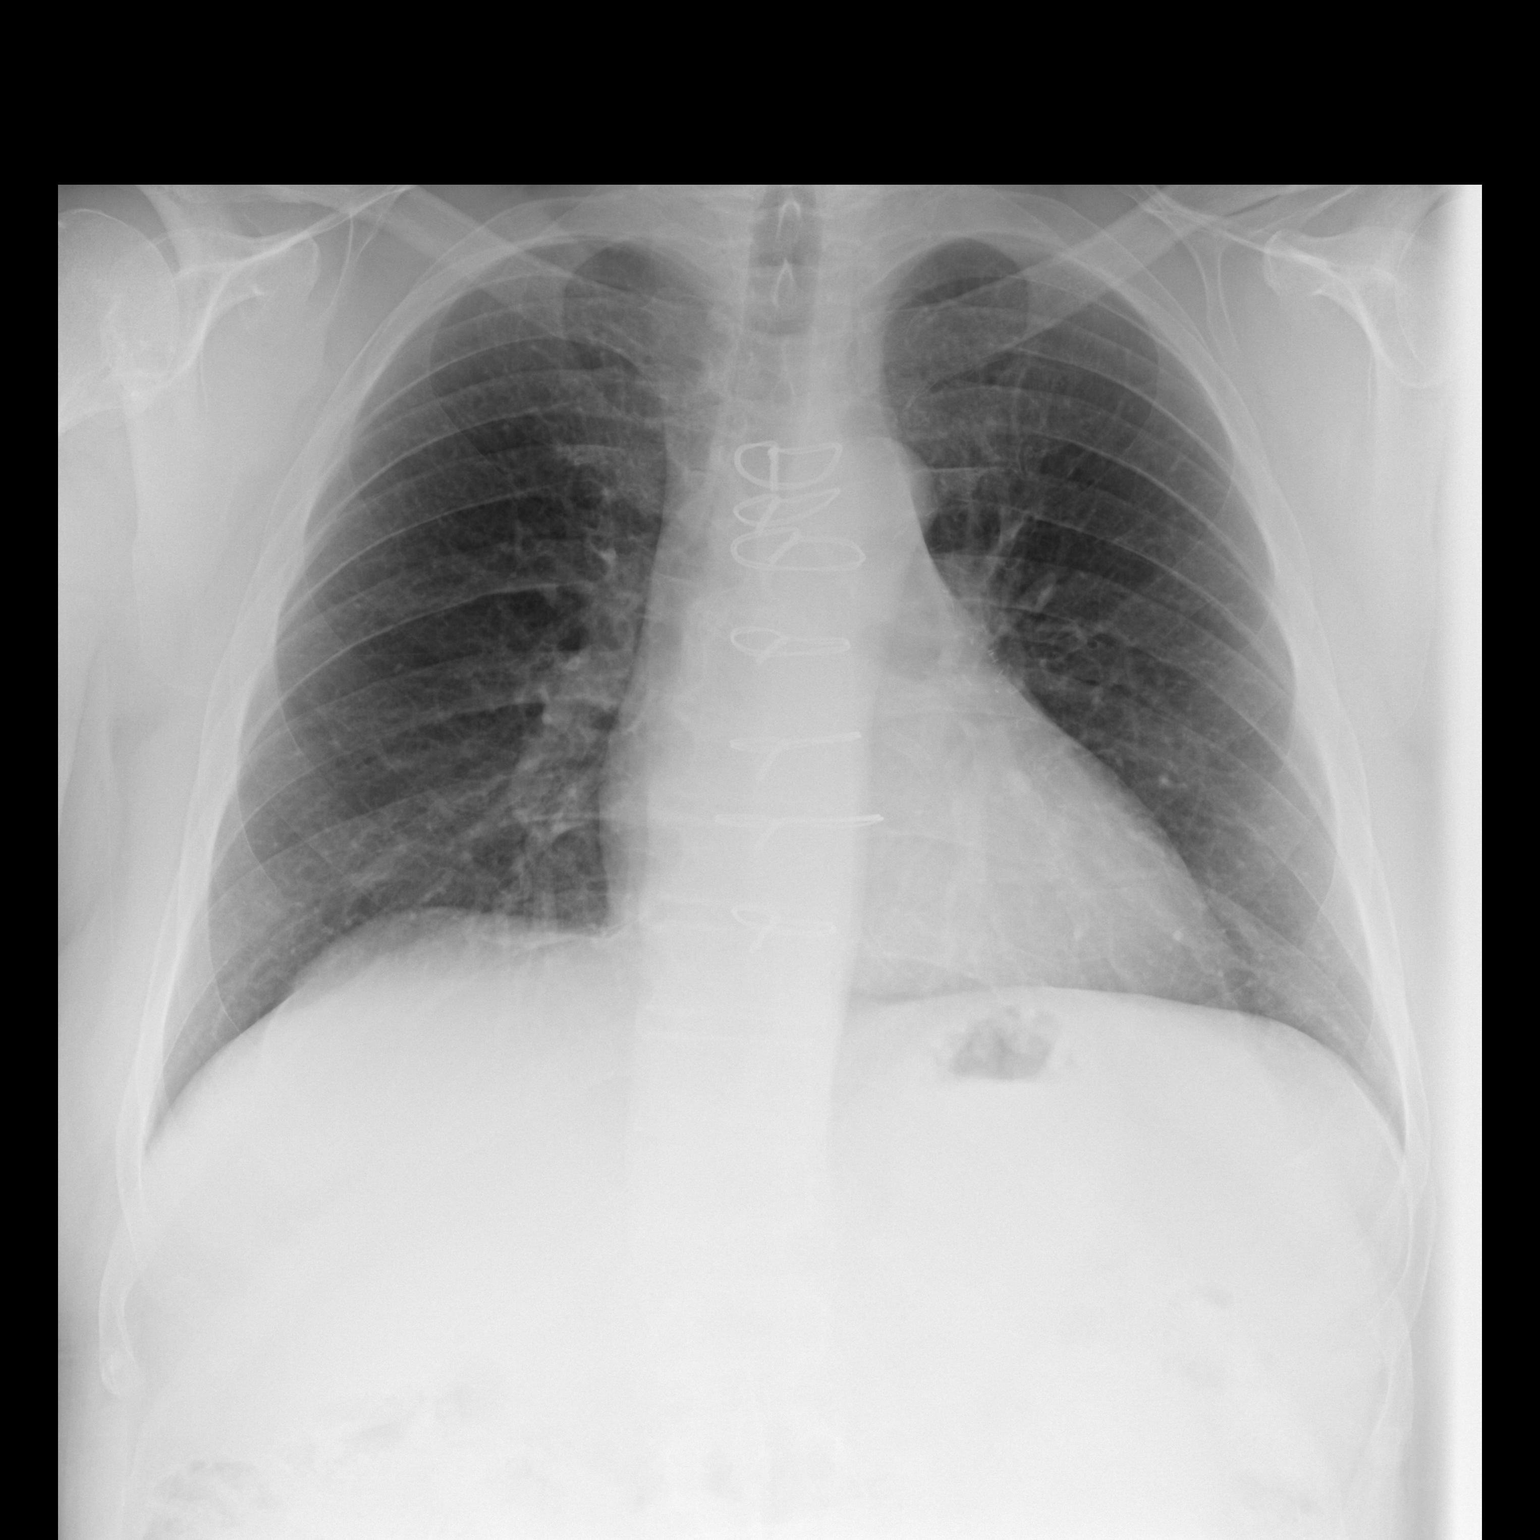

[dg chest 2 view (2 of 2)]
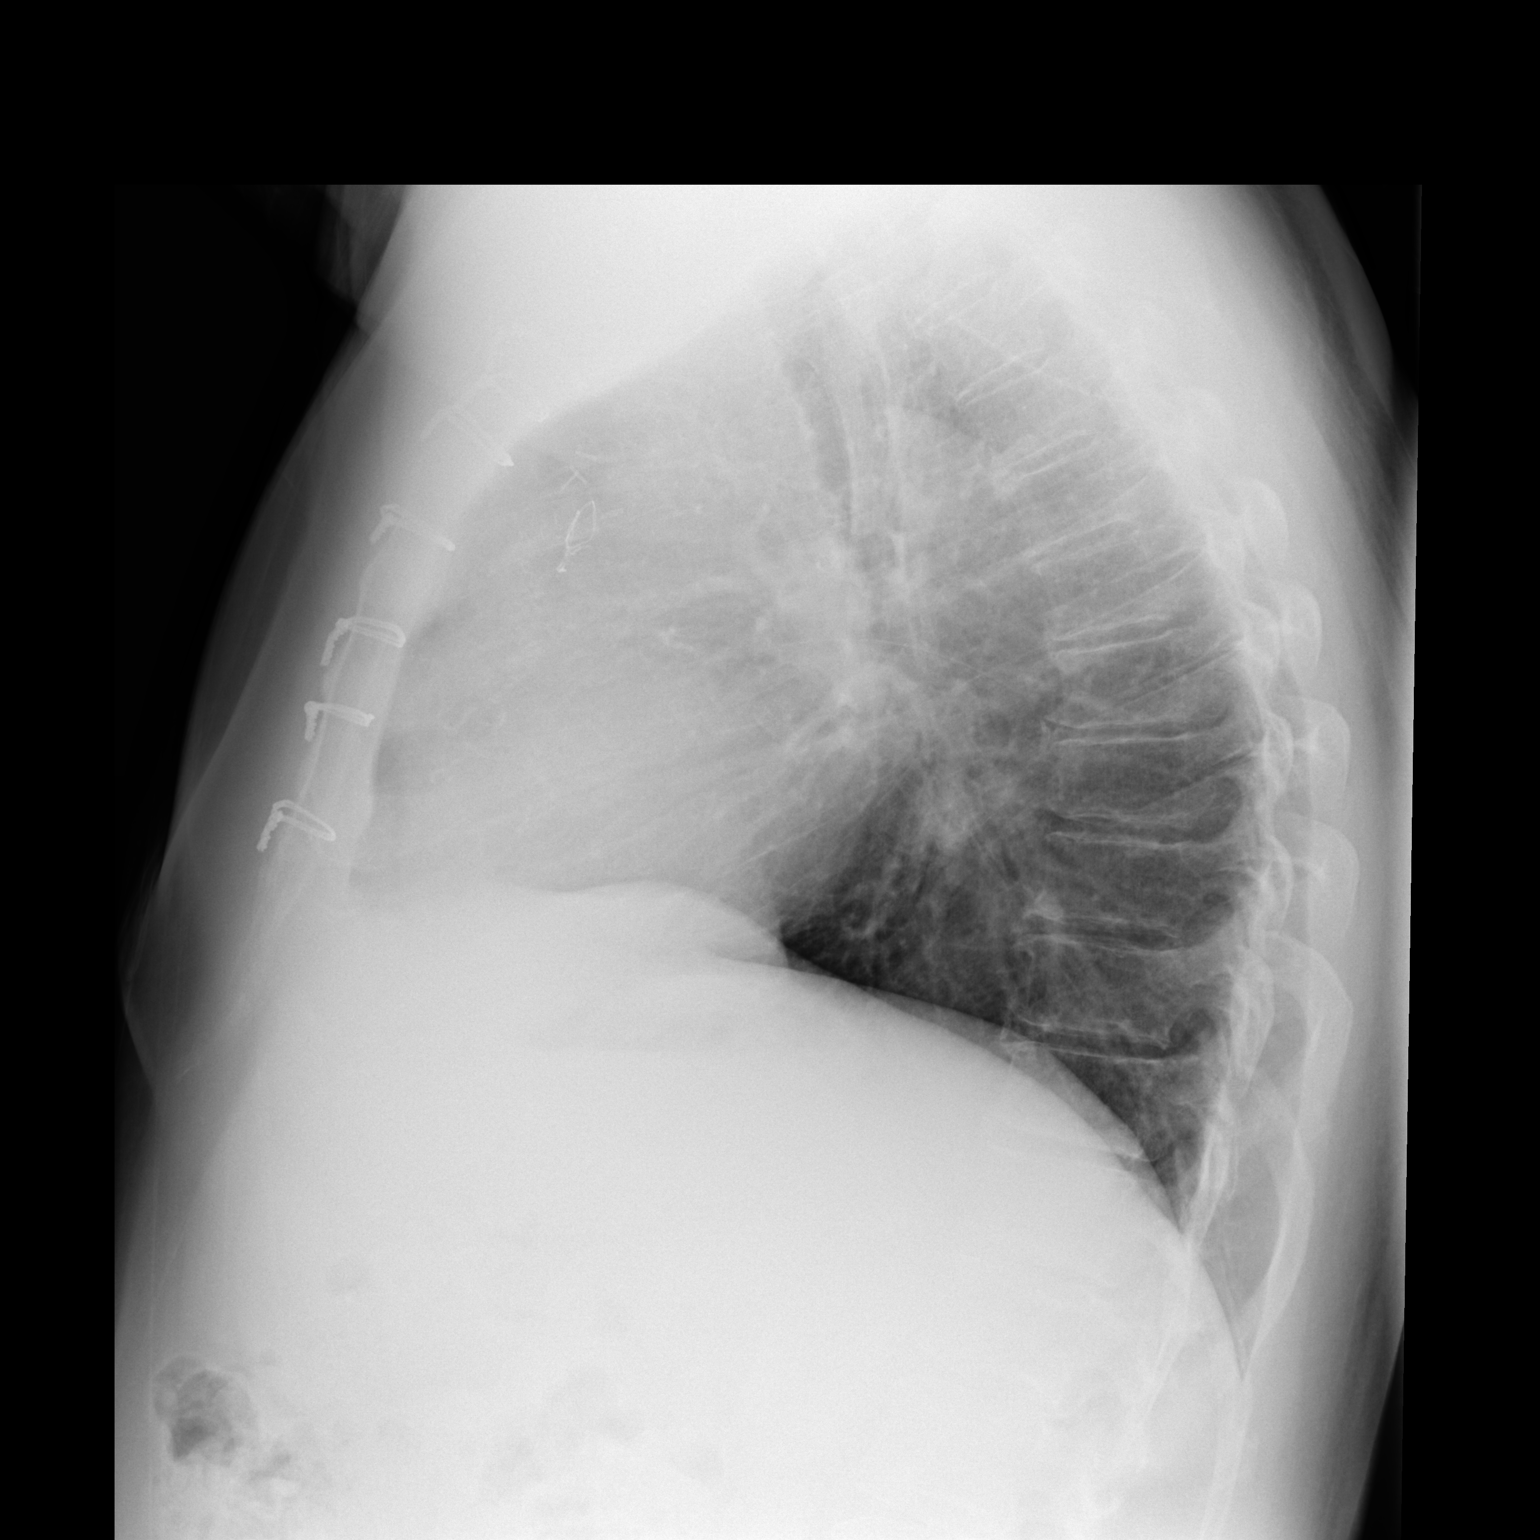

[2 of 2 positions shown; findings below may reference images not displayed]

FINDINGS: The lungs are adequately inflated and clear. The heart and pulmonary
vascularity are normal. There is calcification in the wall of the
aortic arch. The sternal wires are intact. The retrosternal soft
tissues appear normal. There is no pleural effusion. The thoracic
spine exhibits no acute abnormality.
IMPRESSION: There is no pneumonia, CHF, nor other acute cardiopulmonary
abnormality.

## 2017-07-30 IMAGING — MR MR LUMBAR SPINE W/O CM
4 of 5 series · 15 of 48 positions shown · non-contrast
Comparison: CT abdomen and pelvis 07/28/2015 and 06/19/2011.

CLINICAL DATA: Low back pain radiating into both legs for 3 months.
No known injury.

EXAM:
MRI LUMBAR SPINE WITHOUT CONTRAST
TECHNIQUE: Multiplanar, multisequence MR imaging of the lumbar spine was
performed. No intravenous contrast was administered.

[Series 3: T2 · sagittal · 4.0mm · 0.78mm/px · 6 of 15 slices shown (1 of 2)]
[im 1/15]
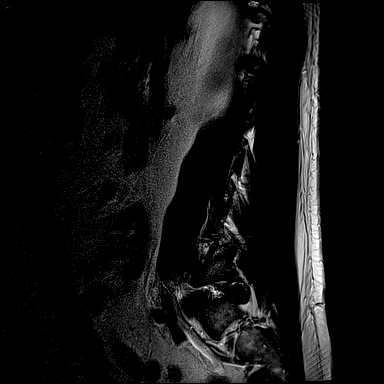
[im 3/15]
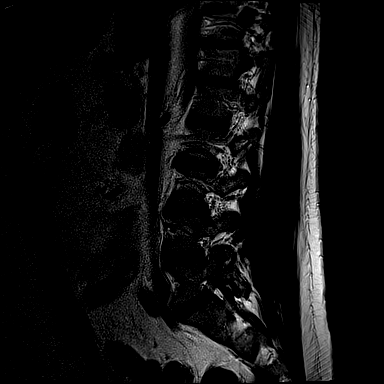
[im 6/15]
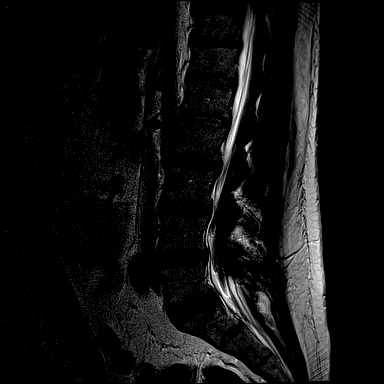
[im 9/15]
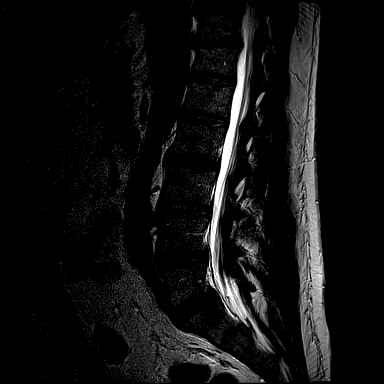
[im 12/15]
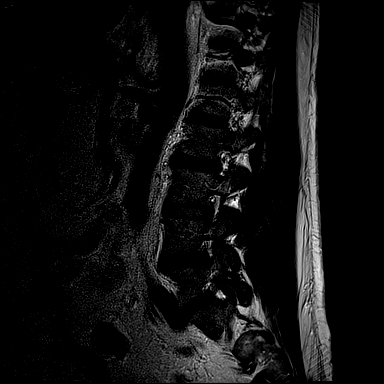
[im 15/15]
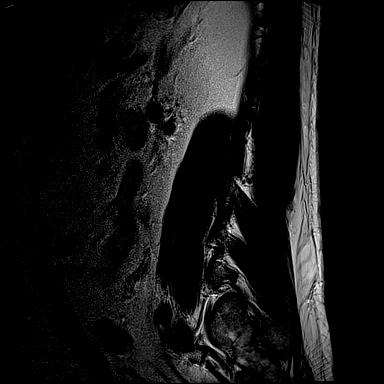

[Series 4: T1 · sagittal · 4.0mm · 0.39mm/px · 3 of 15 slices shown (1 of 2)]
[im 3/15]
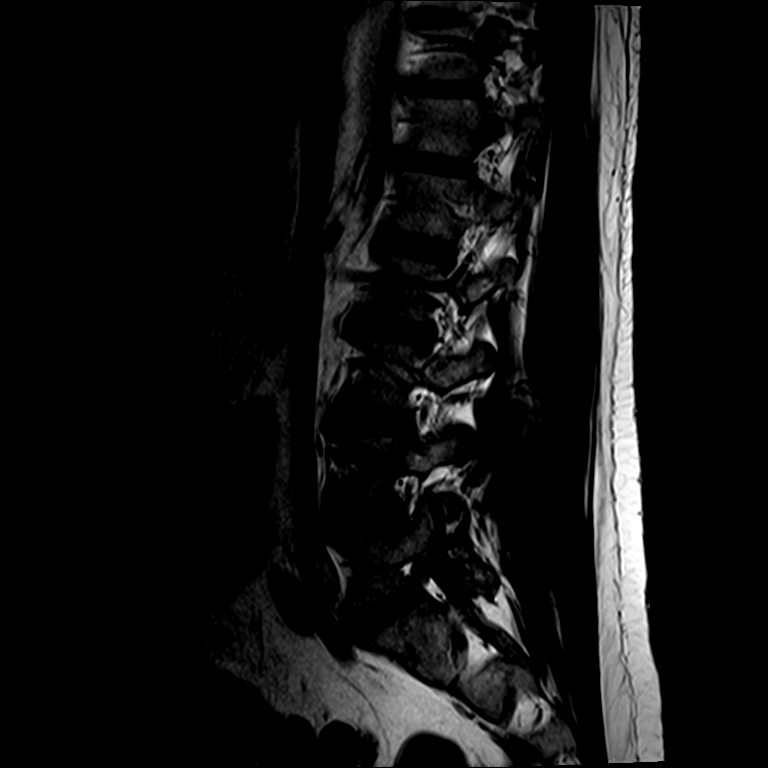
[im 9/15]
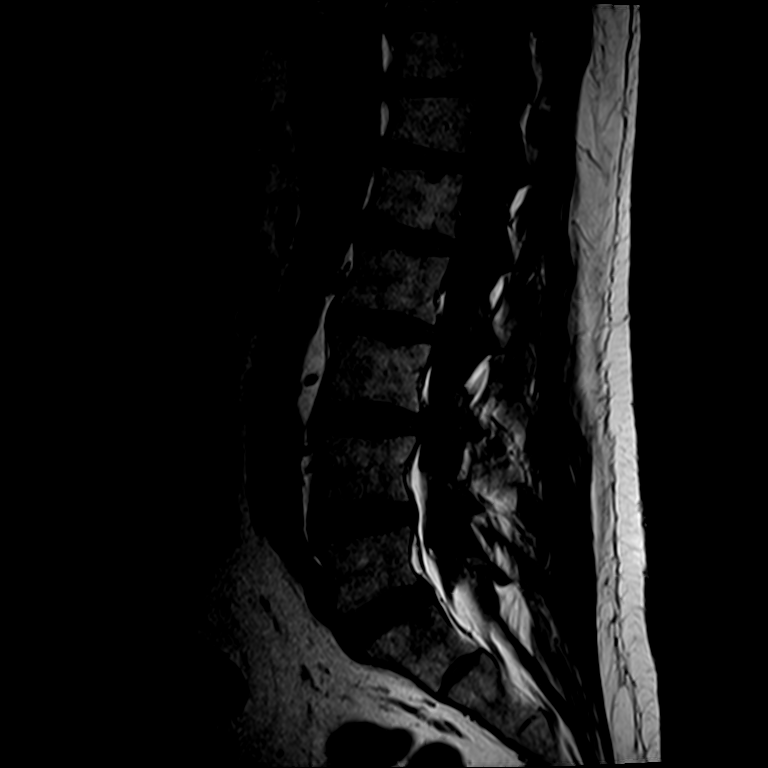
[im 15/15]
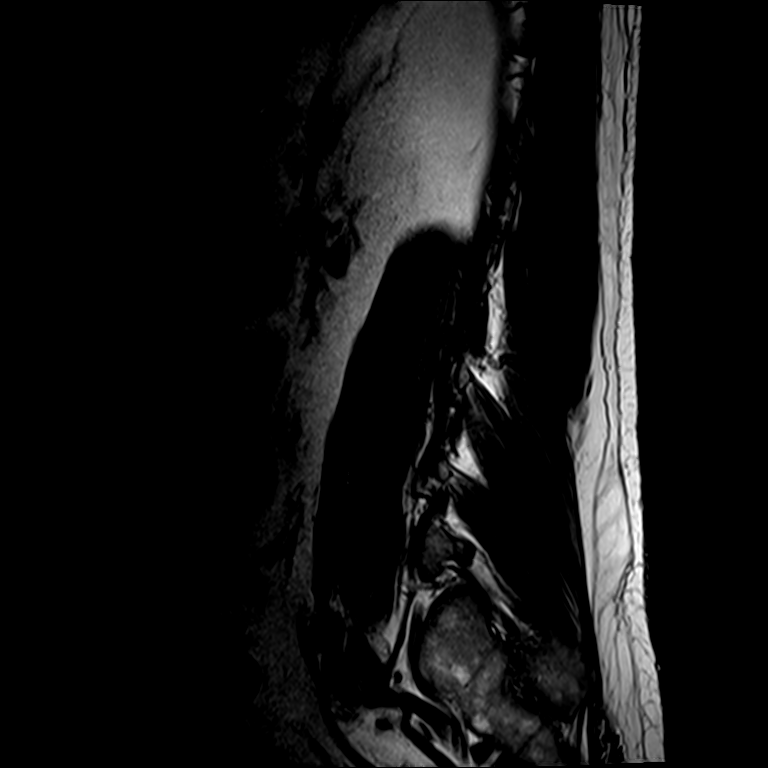

[Series 6: T2 · axial · 4.0mm · 0.25mm/px · z∈[-56,+86]mm · 3 of 38 slices shown (2 of 2)]
[im 6/38]
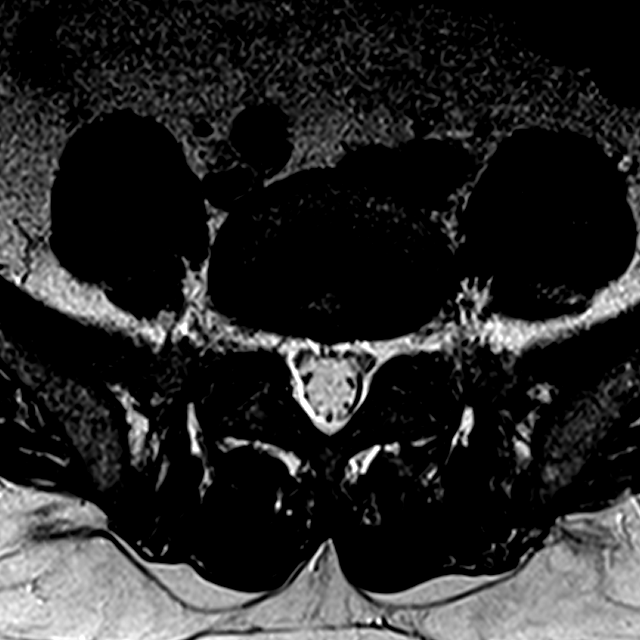
[im 19/38]
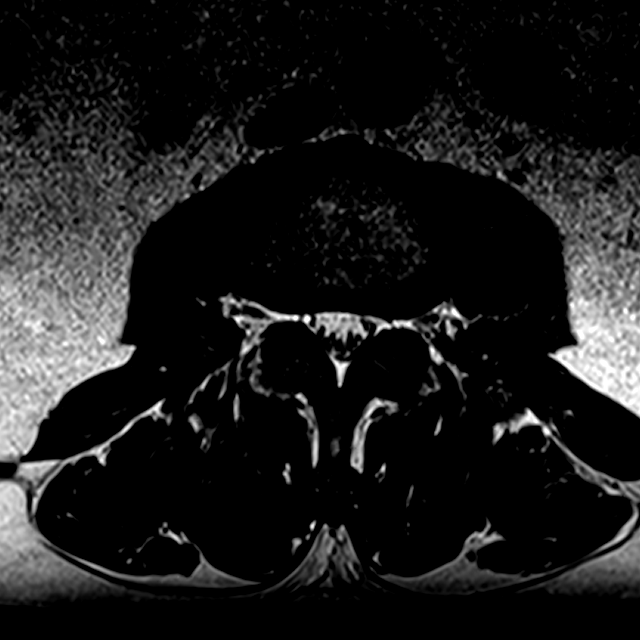
[im 32/38]
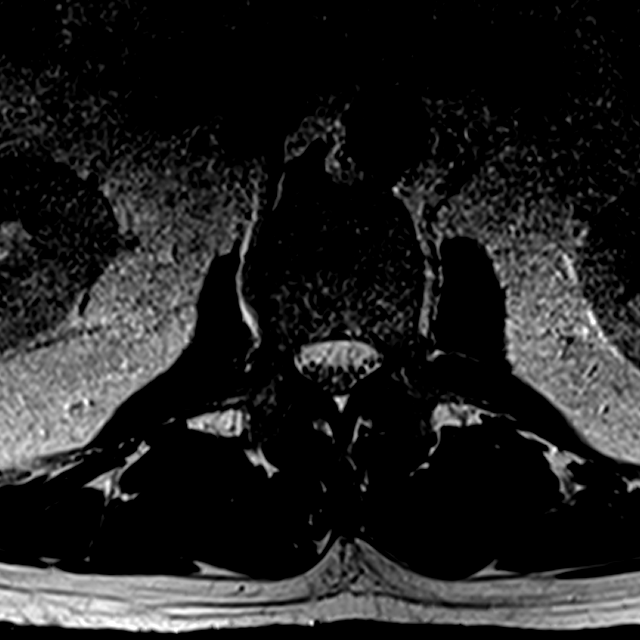

[Series 7: T1 · axial · 4.0mm · 0.25mm/px · z∈[-56,+86]mm · 3 of 38 slices shown (2 of 2)]
[im 6/38]
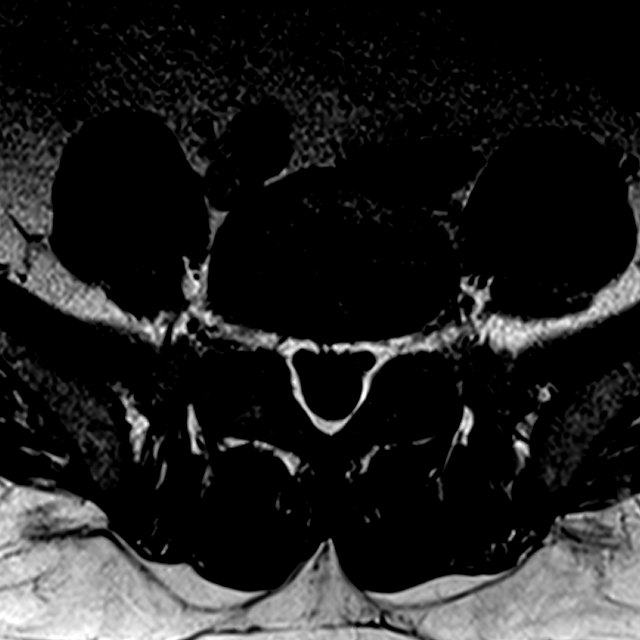
[im 19/38]
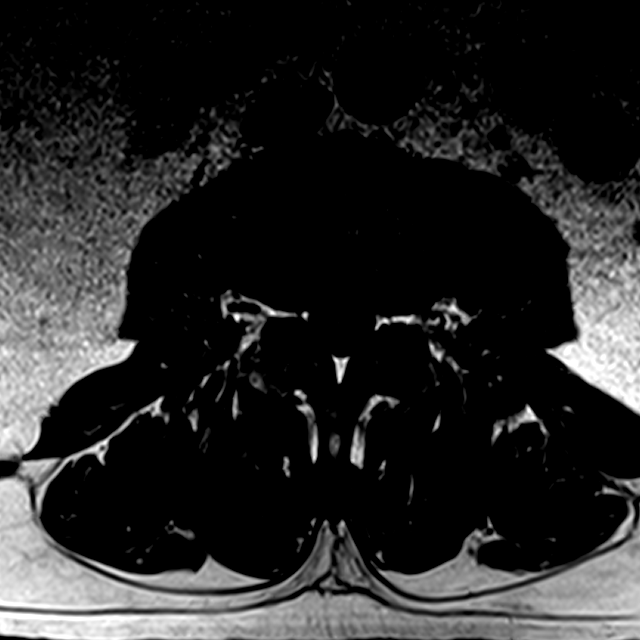
[im 32/38]
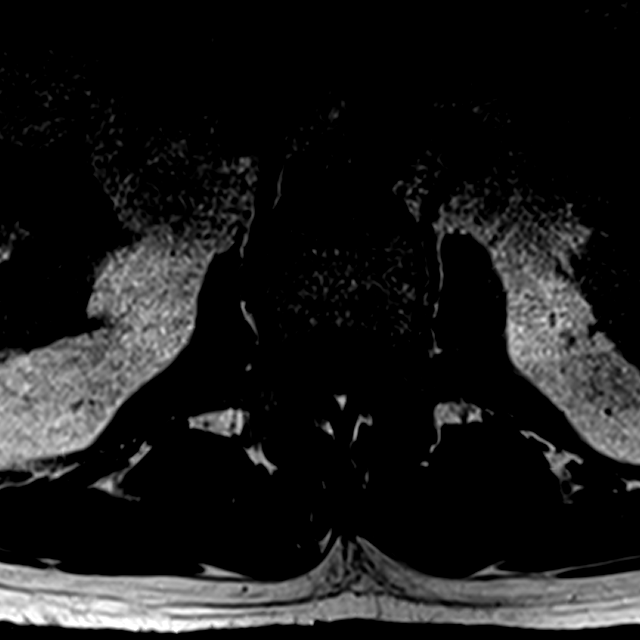

[15 of 48 positions shown; findings below may reference images not displayed]

FINDINGS: Segmentation:  Standard.

Alignment: Trace anterolisthesis L5 on S1 is due to chronic
bilateral L5 pars interarticularis defects.

Vertebrae: No fracture or worrisome marrow lesion. Scattered small
Schmorl's nodes noted.

Conus medullaris: Extends to the T12 level and appears normal.

Paraspinal and other soft tissues: Negative.

Disc levels:

T10-11, T11-12 and T12-L1 are imaged in the sagittal plane only. A
small central protrusion is seen at T11-12 and there is minimal disc
bulging at T10-11 and T12-L1. The central canal and foramina are
open at these levels.

L1-2:  Negative.

L2-3:  The disc is desiccated with a minimal bulge.  No stenosis.

L3-4:  Very shallow disc bulge without stenosis.

L4-5: Shallow disc bulge without stenosis. Mild facet degenerative
disease is more notable on the right.

L5-S1: The disc is slightly uncovered. The central canal is widely
patent and the foramina are open.
IMPRESSION: Negative for central canal or foraminal narrowing.

Chronic bilateral L5 pars interarticularis defects result in trace
anterolisthesis L5 on S1.

## 2017-07-31 ENCOUNTER — Other Ambulatory Visit: Payer: Self-pay | Admitting: Family Medicine

## 2017-08-01 MED ORDER — OXYCODONE HCL 5 MG PO TABS: 5.0000 mg | ORAL_TABLET | ORAL | 0 refills | Status: DC | PRN

## 2017-08-01 NOTE — Telephone Encounter (Signed)
Ok to refill??  Last office visit 04/05/2017.  Last refill 06/22/2107.

## 2017-08-21 ENCOUNTER — Other Ambulatory Visit: Payer: Self-pay | Admitting: Family Medicine

## 2017-09-06 DIAGNOSIS — H25811 Combined forms of age-related cataract, right eye: Secondary | ICD-10-CM | POA: Diagnosis not present

## 2017-09-11 ENCOUNTER — Other Ambulatory Visit: Payer: Self-pay | Admitting: Family Medicine

## 2017-09-11 MED ORDER — OXYCODONE HCL 5 MG PO TABS: 5.0000 mg | ORAL_TABLET | ORAL | 0 refills | Status: DC | PRN

## 2017-09-11 NOTE — Telephone Encounter (Signed)
Ok to refill??  Last office visit 08/01/2017.  Last refill 04/05/2017.

## 2017-09-13 NOTE — Patient Instructions (Signed)
Erie Boberg  09/13/2017     @PREFPERIOPPHARMACY @   Your procedure is scheduled on  09/24/2017 .  Report to [facility] at  615   A.M.  Call this number if you have problems the morning of surgery:  [phone]   Remember:  Do not eat or drink after midnight.  You may drink clear liquids until (follow the instructions given to you) .  Clear liquids allowed are:                    Water, Juice (non-citric and without pulp), Carbonated beverages, Clear Tea, Black Coffee only, Plain Jell-O only, Gatorade and Plain Popsicles only    Take these medicines the morning of surgery with A SIP OF WATER  Gabapentin, lisinopril, metoprolol, oxycodone (if needed), protonix.    Do not wear jewelry, make-up or nail polish.  Do not wear lotions, powders, or perfumes, or deodorant.  Do not shave 48 hours prior to surgery.  Men may shave face and neck.  Do not bring valuables to the hospital.  [facility] is not responsible for any belongings or valuables.  Contacts, dentures or bridgework may not be worn into surgery.  Leave your suitcase in the car.  After surgery it may be brought to your room.  For patients admitted to the hospital, discharge time will be determined by your treatment team.  Patients discharged the day of surgery will not be allowed to drive home.   Name and phone number of your driver:   family Special instructions:  Follow the diet and prep instructions given to you by Dr Nona Dell office.  Please read over the following fact sheets that you were given. Anesthesia Post-op Instructions and Care and Recovery After Surgery       Esophagogastroduodenoscopy Esophagogastroduodenoscopy (EGD) is a procedure to examine the lining of the esophagus, stomach, and first part of the small intestine (duodenum). This procedure is done to check for problems such as inflammation, bleeding, ulcers, or growths. During this procedure, a long, flexible, lighted tube  with a camera attached (endoscope) is inserted down the throat. Tell a health care provider about:  Any allergies you have.  All medicines you are taking, including vitamins, herbs, eye drops, creams, and over-the-counter medicines.  Any problems you or family members have had with anesthetic medicines.  Any blood disorders you have.  Any surgeries you have had.  Any medical conditions you have.  Whether you are pregnant or may be pregnant. What are the risks? Generally, this is a safe procedure. However, problems may occur, including:  Infection.  Bleeding.  A tear (perforation) in the esophagus, stomach, or duodenum.  Trouble breathing.  Excessive sweating.  Spasms of the larynx.  A slowed heartbeat.  Low blood pressure.  What happens before the procedure?  Follow instructions from your health care provider about eating or drinking restrictions.  Ask your health care provider about: ? Changing or stopping your regular medicines. This is especially important if you are taking diabetes medicines or blood thinners. ? Taking medicines such as aspirin and ibuprofen. These medicines can thin your blood. Do not take these medicines before your procedure if your health care provider instructs you not to.  Plan to have someone take you home after the procedure.  If you wear dentures, be ready to remove them before the procedure. What happens during the procedure?  To reduce your risk  of infection, your health care team will wash or sanitize their hands.  An IV tube will be put in a vein in your hand or arm. You will get medicines and fluids through this tube.  You will be given one or more of the following: ? A medicine to help you relax (sedative). ? A medicine to numb the area (local anesthetic). This medicine may be sprayed into your throat. It will make you feel more comfortable and keep you from gagging or coughing during the procedure. ? A medicine for pain.  A  mouth guard may be placed in your mouth to protect your teeth and to keep you from biting on the endoscope.  You will be asked to lie on your left side.  The endoscope will be lowered down your throat into your esophagus, stomach, and duodenum.  Air will be put into the endoscope. This will help your health care provider see better.  The lining of your esophagus, stomach, and duodenum will be examined.  Your health care provider may: ? Take a tissue sample so it can be looked at in a lab (biopsy). ? Remove growths. ? Remove objects (foreign bodies) that are stuck. ? Treat any bleeding with medicines or other devices that stop tissue from bleeding. ? Widen (dilate) or stretch narrowed areas of your esophagus and stomach.  The endoscope will be taken out. The procedure may vary among health care providers and hospitals. What happens after the procedure?  Your blood pressure, heart rate, breathing rate, and blood oxygen level will be monitored often until the medicines you were given have worn off.  Do not eat or drink anything until the numbing medicine has worn off and your gag reflex has returned. This information is not intended to replace advice given to you by your health care provider. Make sure you discuss any questions you have with your health care provider. Document Released: 04/20/2004 Document Revised: 05/26/2015 Document Reviewed: 11/11/2014 Elsevier Interactive Patient Education  2018 [employer]. Esophagogastroduodenoscopy, Care After Refer to this sheet in the next few weeks. These instructions provide you with information about caring for yourself after your procedure. Your health care provider may also give you more specific instructions. Your treatment has been planned according to current medical practices, but problems sometimes occur. Call your health care provider if you have any problems or questions after your procedure. What can I expect after the  procedure? After the procedure, it is common to have:  A sore throat.  Nausea.  Bloating.  Dizziness.  Fatigue.  Follow these instructions at home:  Do not eat or drink anything until the numbing medicine (local anesthetic) has worn off and your gag reflex has returned. You will know that the local anesthetic has worn off when you can swallow comfortably.  Do not drive for 24 hours if you received a medicine to help you relax (sedative).  If your health care provider took a tissue sample for testing during the procedure, make sure to get your test results. This is your responsibility. Ask your health care provider or the department performing the test when your results will be ready.  Keep all follow-up visits as told by your health care provider. This is important. Contact a health care provider if:  You cannot stop coughing.  You are not urinating.  You are urinating less than usual. Get help right away if:  You have trouble swallowing.  You cannot eat or drink.  You have throat or chest  pain that gets worse.  You are dizzy or light-headed.  You faint.  You have nausea or vomiting.  You have chills.  You have a fever.  You have severe abdominal pain.  You have black, tarry, or bloody stools. This information is not intended to replace advice given to you by your health care provider. Make sure you discuss any questions you have with your health care provider. Document Released: 12/05/2011 Document Revised: 05/26/2015 Document Reviewed: 11/11/2014 Elsevier Interactive Patient Education  2018 [employer].  Colonoscopy, Adult A colonoscopy is an exam to look at the large intestine. It is done to check for problems, such as:  Lumps (tumors).  Growths (polyps).  Swelling (inflammation).  Bleeding.  What happens before the procedure? Eating and drinking Follow instructions from your doctor about eating and drinking. These instructions may include:  A  few days before the procedure - follow a low-fiber diet. ? Avoid nuts. ? Avoid seeds. ? Avoid dried fruit. ? Avoid raw fruits. ? Avoid vegetables.  1-3 days before the procedure - follow a clear liquid diet. Avoid liquids that have red or purple dye. Drink only clear liquids, such as: ? Clear broth or bouillon. ? Black coffee or tea. ? Clear juice. ? Clear soft drinks or sports drinks. ? Gelatin dessert. ? Popsicles.  On the day of the procedure - do not eat or drink anything during the 2 hours before the procedure.  Bowel prep If you were prescribed an oral bowel prep:  Take it as told by your doctor. Starting the day before your procedure, you will need to drink a lot of liquid. The liquid will cause you to poop (have bowel movements) until your poop is almost clear or light green.  If your skin or butt gets irritated from diarrhea, you may: ? Wipe the area with wipes that have medicine in them, such as adult wet wipes with aloe and vitamin E. ? Put something on your skin that soothes the area, such as petroleum jelly.  If you throw up (vomit) while drinking the bowel prep, take a break for up to 60 minutes. Then begin the bowel prep again. If you keep throwing up and you cannot take the bowel prep without throwing up, call your doctor.  General instructions  Ask your doctor about changing or stopping your normal medicines. This is important if you take diabetes medicines or blood thinners.  Plan to have someone take you home from the hospital or clinic. What happens during the procedure?  An IV tube may be put into one of your veins.  You will be given medicine to help you relax (sedative).  To reduce your risk of infection: ? Your doctors will wash their hands. ? Your anal area will be washed with soap.  You will be asked to lie on your side with your knees bent.  Your doctor will get a long, thin, flexible tube ready. The tube will have a camera and a light on the  end.  The tube will be put into your anus.  The tube will be gently put into your large intestine.  Air will be delivered into your large intestine to keep it open. You may feel some pressure or cramping.  The camera will be used to take photos.  A small tissue sample may be removed from your body to be looked at under a microscope (biopsy). If any possible problems are found, the tissue will be sent to a lab for testing.  If small growths are found, your doctor may remove them and have them checked for cancer.  The tube that was put into your anus will be slowly removed. The procedure may vary among doctors and hospitals. What happens after the procedure?  Your doctor will check on you often until the medicines you were given have worn off.  Do not drive for 24 hours after the procedure.  You may have a small amount of blood in your poop.  You may pass gas.  You may have mild cramps or bloating in your belly (abdomen).  It is up to you to get the results of your procedure. Ask your doctor, or the department performing the procedure, when your results will be ready. This information is not intended to replace advice given to you by your health care provider. Make sure you discuss any questions you have with your health care provider. Document Released: 01/20/2010 Document Revised: 10/19/2015 Document Reviewed: 03/01/2015 Elsevier Interactive Patient Education  2017 [employer]  Colonoscopy, Adult, Care After This sheet gives you information about how to care for yourself after your procedure. Your health care provider may also give you more specific instructions. If you have problems or questions, contact your health care provider. What can I expect after the procedure? After the procedure, it is common to have:  A small amount of blood in your stool for 24 hours after the procedure.  Some gas.  Mild abdominal cramping or bloating.  Follow these instructions at  home: General instructions   For the first 24 hours after the procedure: ? Do not drive or use machinery. ? Do not sign important documents. ? Do not drink alcohol. ? Do your regular daily activities at a slower pace than normal. ? Eat soft, easy-to-digest foods. ? Rest often.  Take over-the-counter or prescription medicines only as told by your health care provider.  It is up to you to get the results of your procedure. Ask your health care provider, or the department performing the procedure, when your results will be ready. Relieving cramping and bloating  Try walking around when you have cramps or feel bloated.  Apply heat to your abdomen as told by your health care provider. Use a heat source that your health care provider recommends, such as a moist heat pack or a heating pad. ? Place a towel between your skin and the heat source. ? Leave the heat on for 20-30 minutes. ? Remove the heat if your skin turns bright red. This is especially important if you are unable to feel pain, heat, or cold. You may have a greater risk of getting burned. Eating and drinking  Drink enough fluid to keep your urine clear or pale yellow.  Resume your normal diet as instructed by your health care provider. Avoid heavy or fried foods that are hard to digest.  Avoid drinking alcohol for as long as instructed by your health care provider. Contact a health care provider if:  You have blood in your stool 2-3 days after the procedure. Get help right away if:  You have more than a small spotting of blood in your stool.  You pass large blood clots in your stool.  Your abdomen is swollen.  You have nausea or vomiting.  You have a fever.  You have increasing abdominal pain that is not relieved with medicine. This information is not intended to replace advice given to you by your health care provider. Make sure you discuss any questions you  have with your health care provider. Document Released:  08/02/2003 Document Revised: 09/12/2015 Document Reviewed: 03/01/2015 Elsevier Interactive Patient Education  2018 [place] Anesthesia is a term that refers to techniques, procedures, and medicines that help a person stay safe and comfortable during a medical procedure. Monitored anesthesia care, or sedation, is one type of anesthesia. Your anesthesia specialist may recommend sedation if you will be having a procedure that does not require you to be unconscious, such as:  Cataract surgery.  A dental procedure.  A biopsy.  A colonoscopy.  During the procedure, you may receive a medicine to help you relax (sedative). There are three levels of sedation:  Mild sedation. At this level, you may feel awake and relaxed. You will be able to follow directions.  Moderate sedation. At this level, you will be sleepy. You may not remember the procedure.  Deep sedation. At this level, you will be asleep. You will not remember the procedure.  The more medicine you are given, the deeper your level of sedation will be. Depending on how you respond to the procedure, the anesthesia specialist may change your level of sedation or the type of anesthesia to fit your needs. An anesthesia specialist will monitor you closely during the procedure. Let your health care provider know about:  Any allergies you have.  All medicines you are taking, including vitamins, herbs, eye drops, creams, and over-the-counter medicines.  Any use of steroids (by mouth or as a cream).  Any problems you or family members have had with sedatives and anesthetic medicines.  Any blood disorders you have.  Any surgeries you have had.  Any medical conditions you have, such as sleep apnea.  Whether you are pregnant or may be pregnant.  Any use of cigarettes, alcohol, or street drugs. What are the risks? Generally, this is a safe procedure. However, problems may occur, including:  Getting too  much medicine (oversedation).  Nausea.  Allergic reaction to medicines.  Trouble breathing. If this happens, a breathing tube may be used to help with breathing. It will be removed when you are awake and breathing on your own.  Heart trouble.  Lung trouble.  Before the procedure Staying hydrated Follow instructions from your health care provider about hydration, which may include:  Up to 2 hours before the procedure - you may continue to drink clear liquids, such as water, clear fruit juice, black coffee, and plain tea.  Eating and drinking restrictions Follow instructions from your health care provider about eating and drinking, which may include:  8 hours before the procedure - stop eating heavy meals or foods such as meat, fried foods, or fatty foods.  6 hours before the procedure - stop eating light meals or foods, such as toast or cereal.  6 hours before the procedure - stop drinking milk or drinks that contain milk.  2 hours before the procedure - stop drinking clear liquids.  Medicines Ask your health care provider about:  Changing or stopping your regular medicines. This is especially important if you are taking diabetes medicines or blood thinners.  Taking medicines such as aspirin and ibuprofen. These medicines can thin your blood. Do not take these medicines before your procedure if your health care provider instructs you not to.  Tests and exams  You will have a physical exam.  You may have blood tests done to show: ? How well your kidneys and liver are working. ? How well your blood can clot.  General instructions  Plan to have someone take you home from the hospital or clinic.  If you will be going home right after the procedure, plan to have someone with you for 24 hours.  What happens during the procedure?  Your blood pressure, heart rate, breathing, level of pain and overall condition will be monitored.  An IV tube will be inserted into one of  your veins.  Your anesthesia specialist will give you medicines as needed to keep you comfortable during the procedure. This may mean changing the level of sedation.  The procedure will be performed. After the procedure  Your blood pressure, heart rate, breathing rate, and blood oxygen level will be monitored until the medicines you were given have worn off.  Do not drive for 24 hours if you received a sedative.  You may: ? Feel sleepy, clumsy, or nauseous. ? Feel forgetful about what happened after the procedure. ? Have a sore throat if you had a breathing tube during the procedure. ? Vomit. This information is not intended to replace advice given to you by your health care provider. Make sure you discuss any questions you have with your health care provider. Document Released: 09/13/2004 Document Revised: 05/27/2015 Document Reviewed: 04/10/2015 Elsevier Interactive Patient Education  2018 [place], Care After These instructions provide you with information about caring for yourself after your procedure. Your health care provider may also give you more specific instructions. Your treatment has been planned according to current medical practices, but problems sometimes occur. Call your health care provider if you have any problems or questions after your procedure. What can I expect after the procedure? After your procedure, it is common to:  Feel sleepy for several hours.  Feel clumsy and have poor balance for several hours.  Feel forgetful about what happened after the procedure.  Have poor judgment for several hours.  Feel nauseous or vomit.  Have a sore throat if you had a breathing tube during the procedure.  Follow these instructions at home: For at least 24 hours after the procedure:   Do not: ? Participate in activities in which you could fall or become injured. ? Drive. ? Use heavy machinery. ? Drink alcohol. ? Take sleeping pills  or medicines that cause drowsiness. ? Make important decisions or sign legal documents. ? Take care of children on your own.  Rest. Eating and drinking  Follow the diet that is recommended by your health care provider.  If you vomit, drink water, juice, or soup when you can drink without vomiting.  Make sure you have little or no nausea before eating solid foods. General instructions  Have a responsible adult stay with you until you are awake and alert.  Take over-the-counter and prescription medicines only as told by your health care provider.  If you smoke, do not smoke without supervision.  Keep all follow-up visits as told by your health care provider. This is important. Contact a health care provider if:  You keep feeling nauseous or you keep vomiting.  You feel light-headed.  You develop a rash.  You have a fever. Get help right away if:  You have trouble breathing. This information is not intended to replace advice given to you by your health care provider. Make sure you discuss any questions you have with your health care provider. Document Released: 04/10/2015 Document Revised: 08/10/2015 Document Reviewed: 04/10/2015 Elsevier Interactive Patient Education  [employer].

## 2017-09-18 ENCOUNTER — Encounter (HOSPITAL_COMMUNITY)
Admission: RE | Admit: 2017-09-18 | Discharge: 2017-09-18 | Disposition: A | Discharge status: Home / Self Care | Payer: BLUE CROSS/BLUE SHIELD | Source: Ambulatory Visit | Attending: Gastroenterology | Admitting: Gastroenterology

## 2017-09-18 ENCOUNTER — Encounter (HOSPITAL_COMMUNITY): Payer: Self-pay

## 2017-09-18 ENCOUNTER — Other Ambulatory Visit: Payer: Self-pay

## 2017-09-18 DIAGNOSIS — Z01812 Encounter for preprocedural laboratory examination: Secondary | ICD-10-CM | POA: Diagnosis not present

## 2017-09-18 HISTORY — DX: Polyneuropathy, unspecified: G62.9

## 2017-09-18 HISTORY — DX: Personal history of urinary calculi: Z87.442

## 2017-09-18 LAB — CBC WITH DIFFERENTIAL/PLATELET
Basophils Absolute: 0 10*3/uL (ref 0.0–0.1)
Basophils Relative: 1 %
EOS ABS: 0.3 10*3/uL (ref 0.0–0.7)
Eosinophils Relative: 3 %
HEMATOCRIT: 44.5 % (ref 39.0–52.0)
HEMOGLOBIN: 15.4 g/dL (ref 13.0–17.0)
LYMPHS ABS: 2.6 10*3/uL (ref 0.7–4.0)
Lymphocytes Relative: 33 %
MCH: 32.6 pg (ref 26.0–34.0)
MCHC: 34.6 g/dL (ref 30.0–36.0)
MCV: 94.3 fL (ref 78.0–100.0)
MONO ABS: 1.2 10*3/uL — AB (ref 0.1–1.0)
MONOS PCT: 16 %
NEUTROS PCT: 47 %
Neutro Abs: 3.9 10*3/uL (ref 1.7–7.7)
Platelets: 222 10*3/uL (ref 150–400)
RBC: 4.72 MIL/uL (ref 4.22–5.81)
RDW: 13.1 % (ref 11.5–15.5)
WBC: 8 10*3/uL (ref 4.0–10.5)

## 2017-09-18 LAB — BASIC METABOLIC PANEL
Anion gap: 9 (ref 5–15)
BUN: 18 mg/dL (ref 6–20)
CHLORIDE: 103 mmol/L (ref 98–111)
CO2: 25 mmol/L (ref 22–32)
CREATININE: 0.85 mg/dL (ref 0.61–1.24)
Calcium: 9.1 mg/dL (ref 8.9–10.3)
GFR calc Af Amer: >60 mL/min (ref >60)
GFR calc non Af Amer: >60 mL/min (ref >60)
Glucose, Bld: 223 mg/dL — ABNORMAL HIGH (ref 70–99)
Potassium: 4.2 mmol/L (ref 3.5–5.1)
SODIUM: 137 mmol/L (ref 135–145)

## 2017-09-18 NOTE — Progress Notes (Signed)
   09/18/17 1032  OBSTRUCTIVE SLEEP APNEA  Have you ever been diagnosed with sleep apnea through a sleep study? No  Do you snore loudly (loud enough to be heard through closed doors)?  1  Do you often feel tired, fatigued, or sleepy during the daytime (such as falling asleep during driving or talking to someone)? 0  Has anyone observed you stop breathing during your sleep? 1  Do you have, or are you being treated for high blood pressure? 1  BMI more than 35 kg/m2? 1  Age > 50 (1-yes) 1  Neck circumference greater than:Male 16 inches or larger, Male 17inches or larger? 0  Male Gender (Yes=1) 1  Obstructive Sleep Apnea Score 6

## 2017-09-19 ENCOUNTER — Other Ambulatory Visit: Payer: Self-pay | Admitting: Family Medicine

## 2017-09-19 NOTE — Progress Notes (Signed)
CC'D TO PCP °

## 2017-09-24 ENCOUNTER — Ambulatory Visit (HOSPITAL_COMMUNITY)
Admission: RE | Admit: 2017-09-24 | Discharge: 2017-09-24 | Disposition: A | Discharge status: Home / Self Care | Payer: BLUE CROSS/BLUE SHIELD | Source: Ambulatory Visit | Attending: Gastroenterology | Admitting: Gastroenterology

## 2017-09-24 ENCOUNTER — Other Ambulatory Visit: Payer: Self-pay

## 2017-09-24 ENCOUNTER — Ambulatory Visit (HOSPITAL_COMMUNITY): Payer: BLUE CROSS/BLUE SHIELD | Admitting: Anesthesiology

## 2017-09-24 ENCOUNTER — Encounter (HOSPITAL_COMMUNITY)
Admission: RE | Disposition: A | Discharge status: Home / Self Care | Payer: Self-pay | Source: Ambulatory Visit | Attending: Gastroenterology

## 2017-09-24 DIAGNOSIS — I2510 Atherosclerotic heart disease of native coronary artery without angina pectoris: Secondary | ICD-10-CM | POA: Diagnosis not present

## 2017-09-24 DIAGNOSIS — Z8711 Personal history of peptic ulcer disease: Secondary | ICD-10-CM | POA: Insufficient documentation

## 2017-09-24 DIAGNOSIS — I255 Ischemic cardiomyopathy: Secondary | ICD-10-CM | POA: Insufficient documentation

## 2017-09-24 DIAGNOSIS — K449 Diaphragmatic hernia without obstruction or gangrene: Secondary | ICD-10-CM | POA: Insufficient documentation

## 2017-09-24 DIAGNOSIS — K219 Gastro-esophageal reflux disease without esophagitis: Secondary | ICD-10-CM | POA: Diagnosis not present

## 2017-09-24 DIAGNOSIS — E669 Obesity, unspecified: Secondary | ICD-10-CM | POA: Diagnosis not present

## 2017-09-24 DIAGNOSIS — Z1211 Encounter for screening for malignant neoplasm of colon: Secondary | ICD-10-CM

## 2017-09-24 DIAGNOSIS — I252 Old myocardial infarction: Secondary | ICD-10-CM | POA: Diagnosis not present

## 2017-09-24 DIAGNOSIS — K635 Polyp of colon: Secondary | ICD-10-CM | POA: Diagnosis not present

## 2017-09-24 DIAGNOSIS — Z951 Presence of aortocoronary bypass graft: Secondary | ICD-10-CM | POA: Diagnosis not present

## 2017-09-24 DIAGNOSIS — K279 Peptic ulcer, site unspecified, unspecified as acute or chronic, without hemorrhage or perforation: Secondary | ICD-10-CM | POA: Diagnosis not present

## 2017-09-24 DIAGNOSIS — F419 Anxiety disorder, unspecified: Secondary | ICD-10-CM | POA: Diagnosis not present

## 2017-09-24 DIAGNOSIS — Z955 Presence of coronary angioplasty implant and graft: Secondary | ICD-10-CM | POA: Diagnosis not present

## 2017-09-24 DIAGNOSIS — I509 Heart failure, unspecified: Secondary | ICD-10-CM | POA: Insufficient documentation

## 2017-09-24 DIAGNOSIS — D123 Benign neoplasm of transverse colon: Secondary | ICD-10-CM | POA: Diagnosis not present

## 2017-09-24 DIAGNOSIS — K319 Disease of stomach and duodenum, unspecified: Secondary | ICD-10-CM | POA: Insufficient documentation

## 2017-09-24 DIAGNOSIS — M1990 Unspecified osteoarthritis, unspecified site: Secondary | ICD-10-CM | POA: Diagnosis not present

## 2017-09-24 DIAGNOSIS — Q438 Other specified congenital malformations of intestine: Secondary | ICD-10-CM | POA: Insufficient documentation

## 2017-09-24 DIAGNOSIS — K648 Other hemorrhoids: Secondary | ICD-10-CM | POA: Insufficient documentation

## 2017-09-24 DIAGNOSIS — K644 Residual hemorrhoidal skin tags: Secondary | ICD-10-CM | POA: Diagnosis not present

## 2017-09-24 DIAGNOSIS — Z79899 Other long term (current) drug therapy: Secondary | ICD-10-CM | POA: Insufficient documentation

## 2017-09-24 DIAGNOSIS — Z87891 Personal history of nicotine dependence: Secondary | ICD-10-CM | POA: Insufficient documentation

## 2017-09-24 DIAGNOSIS — I110 Hypertensive heart disease with heart failure: Secondary | ICD-10-CM | POA: Insufficient documentation

## 2017-09-24 DIAGNOSIS — Z823 Family history of stroke: Secondary | ICD-10-CM | POA: Insufficient documentation

## 2017-09-24 DIAGNOSIS — Z794 Long term (current) use of insulin: Secondary | ICD-10-CM | POA: Diagnosis not present

## 2017-09-24 DIAGNOSIS — K2970 Gastritis, unspecified, without bleeding: Secondary | ICD-10-CM | POA: Insufficient documentation

## 2017-09-24 DIAGNOSIS — E7800 Pure hypercholesterolemia, unspecified: Secondary | ICD-10-CM | POA: Diagnosis not present

## 2017-09-24 DIAGNOSIS — T39015A Adverse effect of aspirin, initial encounter: Secondary | ICD-10-CM | POA: Insufficient documentation

## 2017-09-24 DIAGNOSIS — K259 Gastric ulcer, unspecified as acute or chronic, without hemorrhage or perforation: Secondary | ICD-10-CM | POA: Diagnosis not present

## 2017-09-24 DIAGNOSIS — E1140 Type 2 diabetes mellitus with diabetic neuropathy, unspecified: Secondary | ICD-10-CM | POA: Insufficient documentation

## 2017-09-24 DIAGNOSIS — Z7982 Long term (current) use of aspirin: Secondary | ICD-10-CM | POA: Diagnosis not present

## 2017-09-24 DIAGNOSIS — Z87442 Personal history of urinary calculi: Secondary | ICD-10-CM | POA: Insufficient documentation

## 2017-09-24 DIAGNOSIS — Z8249 Family history of ischemic heart disease and other diseases of the circulatory system: Secondary | ICD-10-CM | POA: Insufficient documentation

## 2017-09-24 HISTORY — PX: POLYPECTOMY: SHX5525

## 2017-09-24 HISTORY — PX: ESOPHAGOGASTRODUODENOSCOPY (EGD) WITH PROPOFOL: SHX5813

## 2017-09-24 HISTORY — PX: COLONOSCOPY WITH PROPOFOL: SHX5780

## 2017-09-24 HISTORY — PX: BIOPSY: SHX5522

## 2017-09-24 LAB — GLUCOSE, CAPILLARY: GLUCOSE-CAPILLARY: 218 mg/dL — AB (ref 70–99)

## 2017-09-24 SURGERY — COLONOSCOPY WITH PROPOFOL
Anesthesia: Monitor Anesthesia Care

## 2017-09-24 MED ORDER — PHENYLEPHRINE HCL 10 MG/ML IJ SOLN
INTRAMUSCULAR | Status: DC | PRN
  Administered 2017-09-24 (×2): 80 ug via INTRAVENOUS

## 2017-09-24 MED ORDER — PROPOFOL 500 MG/50ML IV EMUL
INTRAVENOUS | Status: DC | PRN
  Administered 2017-09-24: 08:00:00 via INTRAVENOUS
  Administered 2017-09-24: 135 ug/kg/min via INTRAVENOUS

## 2017-09-24 MED ORDER — LACTATED RINGERS IV SOLN: INTRAVENOUS | Status: DC

## 2017-09-24 MED ORDER — HYDROCODONE-ACETAMINOPHEN 7.5-325 MG PO TABS: 1.0000 | ORAL_TABLET | Freq: Once | ORAL | Status: DC | PRN

## 2017-09-24 MED ORDER — MIDAZOLAM HCL 5 MG/5ML IJ SOLN
INTRAMUSCULAR | Status: DC | PRN
  Administered 2017-09-24: 2 mg via INTRAVENOUS

## 2017-09-24 MED ORDER — LACTATED RINGERS IV SOLN
INTRAVENOUS | Status: DC | PRN
  Administered 2017-09-24: 07:00:00 via INTRAVENOUS

## 2017-09-24 MED ORDER — EPHEDRINE SULFATE 50 MG/ML IJ SOLN
INTRAMUSCULAR | Status: DC | PRN
  Administered 2017-09-24: 10 mg via INTRAVENOUS

## 2017-09-24 MED ORDER — PROMETHAZINE HCL 25 MG/ML IJ SOLN: 6.2500 mg | INTRAMUSCULAR | Status: DC | PRN

## 2017-09-24 MED ORDER — CHLORHEXIDINE GLUCONATE CLOTH 2 % EX PADS: 6.0000 | MEDICATED_PAD | Freq: Once | CUTANEOUS | Status: DC

## 2017-09-24 MED ORDER — MEPERIDINE HCL 100 MG/ML IJ SOLN: 6.2500 mg | INTRAMUSCULAR | Status: DC | PRN

## 2017-09-24 MED ORDER — HYDROMORPHONE HCL 1 MG/ML IJ SOLN: 0.2500 mg | INTRAMUSCULAR | Status: DC | PRN

## 2017-09-24 MED ORDER — PROPOFOL 10 MG/ML IV BOLUS
INTRAVENOUS | Status: AC
  Filled 2017-09-24: qty 60

## 2017-09-24 MED ORDER — STERILE WATER FOR IRRIGATION IR SOLN
Status: DC | PRN
  Administered 2017-09-24: 100 mL

## 2017-09-24 MED ORDER — GLYCOPYRROLATE 0.2 MG/ML IJ SOLN
INTRAMUSCULAR | Status: DC | PRN
  Administered 2017-09-24: 0.2 mg via INTRAVENOUS

## 2017-09-24 MED ORDER — GLYCOPYRROLATE 0.2 MG/ML IJ SOLN
INTRAMUSCULAR | Status: AC
  Filled 2017-09-24: qty 1

## 2017-09-24 MED ORDER — MIDAZOLAM HCL 2 MG/2ML IJ SOLN
INTRAMUSCULAR | Status: AC
  Filled 2017-09-24: qty 2

## 2017-09-24 NOTE — Op Note (Signed)
[facility] Patient Name: Juan Ray Procedure Date: 09/24/2017 8:07 AM MRN: [id number] Date of Birth: [birth year] Attending MD: Barney Drain MD, MD CSN: [id number] Age: 57 Admit Type: Outpatient Procedure:                Upper GI endoscopy WITH COLD FORCEPS BIOPSY Indications:              Follow-up of peptic ulcer Providers:                Barney Drain MD, MD, Rosina Lowenstein, RN, Randa Spike, Technician Referring MD:              Medicines:                Propofol per Anesthesia Complications:            No immediate complications. Estimated Blood Loss:     Estimated blood loss was minimal. Procedure:                Pre-Anesthesia Assessment:                           - Prior to the procedure, a History and Physical                            was performed, and patient medications and                            allergies were reviewed. The patient's tolerance of                            previous anesthesia was also reviewed. The risks                            and benefits of the procedure and the sedation                            options and risks were discussed with the patient.                            All questions were answered, and informed consent                            was obtained. Prior Anticoagulants: The patient has                            taken aspirin, last dose was 1 day prior to                            procedure. ASA Grade Assessment: II - A patient                            with mild systemic disease. After reviewing the  risks and benefits, the patient was deemed in                            satisfactory condition to undergo the procedure.                            After obtaining informed consent, the endoscope was                            passed under direct vision. Throughout the                            procedure, the patient's blood pressure, pulse, and         oxygen saturations were monitored continuously. The                            [id number]-[id number] ([id number]) scope was introduced through the                            mouth, and advanced to the second part of duodenum.                            The upper GI endoscopy was accomplished without                            difficulty. The patient tolerated the procedure                            well. Scope In: 8:10:25 AM Scope Out: 8:15:18 AM Total Procedure Duration: 0 hours 4 minutes 53 seconds  Findings:      The examined esophagus was normal.      A small hiatal hernia was present.      Patchy mild inflammation characterized by congestion (edema), erosions       and erythema was found on the greater curvature of the stomach and in       the gastric antrum. Biopsies were taken with a cold forceps for       Helicobacter pylori testing.      Multiple diffuse erosions without bleeding were found in the duodenal       bulb.      The second portion of the duodenum was normal. Impression:               - Small hiatal hernia.                           - MILD Gastritis AND MODERATE DUODENITIS DUE TO ASA                            USE. Moderate Sedation:      Per Anesthesia Care Recommendation:           - Patient has a contact number available for                            emergencies. The signs and symptoms of potential  delayed complications were discussed with the                            patient. Return to normal activities tomorrow.                            Written discharge instructions were provided to the                            patient.                           - High fiber diet and low fat diet.                           - Continue present medications. CONTINUE PROTONIX                            DAILY FOREVER                           - Await pathology results. Procedure Code(s):        --- Professional ---                           [phone],  Esophagogastroduodenoscopy, flexible,                            transoral; with biopsy, single or multiple Diagnosis Code(s):        --- Professional ---                           K44.9, Diaphragmatic hernia without obstruction or                            gangrene                           K29.70, Gastritis, unspecified, without bleeding                           K26.9, Duodenal ulcer, unspecified as acute or                            chronic, without hemorrhage or perforation                           K27.9, Peptic ulcer, site unspecified, unspecified                            as acute or chronic, without hemorrhage or                            perforation CPT copyright 2017 [employer]. All rights reserved. The codes documented in this report are preliminary and upon coder review may  be revised to meet current compliance requirements. Barney Drain, MD Barney Drain MD, MD  09/24/2017 8:38:42 AM This report has been signed electronically. Number of Addenda: 0

## 2017-09-24 NOTE — Anesthesia Preprocedure Evaluation (Signed)
Anesthesia Evaluation  Patient identified by MRN, date of birth, ID band Patient awake    Reviewed: Allergy & Precautions, H&P , NPO status , Patient's Chart, lab work & pertinent test results, reviewed documented beta blocker date and time   Airway Mallampati: II  TM Distance: >3 FB Neck ROM: full    Dental no notable dental hx. (+) Edentulous Lower, Edentulous Upper   Pulmonary neg pulmonary ROS, former smoker,    Pulmonary exam normal breath sounds clear to auscultation       Cardiovascular Exercise Tolerance: Good hypertension, + CAD, + Past MI and +CHF  negative cardio ROS   Rhythm:regular Rate:Normal     Neuro/Psych negative neurological ROS  negative psych ROS   GI/Hepatic negative GI ROS, Neg liver ROS,   Endo/Other  negative endocrine ROSdiabetes, Type 2  Renal/GU negative Renal ROS  negative genitourinary   Musculoskeletal   Abdominal   Peds  Hematology negative hematology ROS (+)   Anesthesia Other Findings ST elevated (STEMI) MI s/p CABG  Reproductive/Obstetrics negative OB ROS                             Anesthesia Physical Anesthesia Plan  ASA: IV  Anesthesia Plan: MAC   Post-op Pain Management:    Induction:   PONV Risk Score and Plan:   Airway Management Planned:   Additional Equipment:   Intra-op Plan:   Post-operative Plan:   Informed Consent: I have reviewed the patients History and Physical, chart, labs and discussed the procedure including the risks, benefits and alternatives for the proposed anesthesia with the patient or authorized representative who has indicated his/her understanding and acceptance.   Dental Advisory Given  Plan Discussed with: CRNA and Anesthesiologist  Anesthesia Plan Comments:         Anesthesia Quick Evaluation

## 2017-09-24 NOTE — Anesthesia Postprocedure Evaluation (Signed)
Anesthesia Post Note  Patient: Juan Ray  Procedure(s) Performed: COLONOSCOPY WITH PROPOFOL (N/A ) ESOPHAGOGASTRODUODENOSCOPY (EGD) WITH PROPOFOL (N/A ) POLYPECTOMY BIOPSY  Patient location during evaluation: PACU Anesthesia Type: MAC Level of consciousness: awake Pain management: pain level controlled Vital Signs Assessment: post-procedure vital signs reviewed and stable Respiratory status: spontaneous breathing, nonlabored ventilation and respiratory function stable Cardiovascular status: blood pressure returned to baseline Postop Assessment: no apparent nausea or vomiting Anesthetic complications: no     Last Vitals:  Vitals:   09/24/17 0655 09/24/17 0706  BP: 136/78 (!) 145/81  Pulse: 62 (!) 59  Resp: 18 13  Temp: 36.8 C   SpO2: 98% 99%    Last Pain:  Vitals:   09/24/17 0819  TempSrc:   PainSc: 0-No pain                 Jailee Jaquez J

## 2017-09-24 NOTE — Transfer of Care (Signed)
Immediate Anesthesia Transfer of Care Note  Patient: Snyder Howton  Procedure(s) Performed: COLONOSCOPY WITH PROPOFOL (N/A ) ESOPHAGOGASTRODUODENOSCOPY (EGD) WITH PROPOFOL (N/A ) POLYPECTOMY BIOPSY  Patient Location: PACU  Anesthesia Type:MAC  Level of Consciousness: awake and patient cooperative  Airway & Oxygen Therapy: Patient Spontanous Breathing and Patient connected to nasal cannula oxygen  Post-op Assessment: Report given to RN, Post -op Vital signs reviewed and stable and Patient moving all extremities  Post vital signs: Reviewed and stable  Last Vitals:  Vitals Value Taken Time  BP    Temp    Pulse 71 09/24/2017  8:34 AM  Resp    SpO2 91 % 09/24/2017  8:34 AM  Vitals shown include unvalidated device data.  Last Pain:  Vitals:   09/24/17 0819  TempSrc:   PainSc: 0-No pain      Patients Stated Pain Goal: 8 (86/16/83 7290)  Complications: No apparent anesthesia complications

## 2017-09-24 NOTE — Discharge Instructions (Signed)
You had 5 polyps removed.the prep in your right colon was INADEQUATE BECAUSE YOU HAVE FOOD IN THE COLON.  You have MODERATE internal hemorrhoids. You have mild gastritis/MODERATE DUODENITIS DUE TO ASPIRIN USE, & a SMALL HIATAL HERNIA. I biopsied your stomach.   DRINK WATER TO KEEP YOUR URINE LIGHT YELLOW.  CONTINUE YOUR WEIGHT LOSS EFFORTS.  FOLLOW A HIGH FIBER/LOW FAT DIET. AVOID ITEMS THAT CAUSE BLOATING. SEE INFO BELOW ON A HIGH FIBER DIET.   TO PREVENT ULCERS AND GASTRITIS/DUODENITIS DUE TO DAILY ASPIRIN USE, CONTINUE PROTONIX. TAKE 30 MINUTES PRIOR TO BREAKFAST EVERY  DAY.   YOUR BIOPSY RESULTS WILL BE BACK IN 5 BUSINESS DAYS.  YOUR next colonoscopy WILL BE SCHEDULED BASED ON YOUR PATH RESULTS. IF YOU HAVE SIMPLE ADENOMAS IT WILL NEED TO BE PRIOR TO THE END OF THIS YEAR WITH A TWO DAY PREP. OTHERWISE IT WILL BE 5 YEARS.    ENDOSCOPY Care After Read the instructions outlined below and refer to this sheet in the next week. These discharge instructions provide you with general information on caring for yourself after you leave the hospital. While your treatment has been planned according to the most current medical practices available, unavoidable complications occasionally occur. If you have any problems or questions after discharge, call DR. Ivery Michalski, [phone].  ACTIVITY  You may resume your regular activity, but move at a slower pace for the next 24 hours.   Take frequent rest periods for the next 24 hours.   Walking will help get rid of the air and reduce the bloated feeling in your belly (abdomen).   No driving for 24 hours (because of the medicine (anesthesia) used during the test).   You may shower.   Do not sign any important legal documents or operate any machinery for 24 hours (because of the anesthesia used during the test).    NUTRITION  Drink plenty of fluids.   You may resume your normal diet as instructed by your doctor.   Begin with a light meal and  progress to your normal diet. Heavy or fried foods are harder to digest and may make you feel sick to your stomach (nauseated).   Avoid alcoholic beverages for 24 hours or as instructed.    MEDICATIONS  You may resume your normal medications.   WHAT YOU CAN EXPECT TODAY  Some feelings of bloating in the abdomen.   Passage of more gas than usual.   Spotting of blood in your stool or on the toilet paper  .  IF YOU HAD POLYPS REMOVED DURING THE ENDOSCOPY:  Eat a soft diet IF YOU HAVE NAUSEA, BLOATING, ABDOMINAL PAIN, OR VOMITING.    FINDING OUT THE RESULTS OF YOUR TEST Not all test results are available during your visit. DR. Oneida Alar WILL CALL YOU WITHIN 14 DAYS OF YOUR PROCEDUE WITH YOUR RESULTS. Do not assume everything is normal if you have not heard from DR. Mayte Diers, CALL HER OFFICE AT [phone].  SEEK IMMEDIATE MEDICAL ATTENTION AND CALL THE OFFICE: [phone] IF:  You have more than a spotting of blood in your stool.   Your belly is swollen (abdominal distention).   You are nauseated or vomiting.   You have a temperature over 101F.   You have abdominal pain or discomfort that is severe or gets worse throughout the day.   Gastritis/DUODENITIS  Gastritis is an inflammation (the body's way of reacting to injury and/or infection) of the stomach. DUODENITIS is an inflammation (the body's way of reacting to injury  and/or infection) of the FIRST PART OF THE SMALL INTESTINES. It is often caused by bacterial (germ) infections. It can also be caused BY ASPIRIN, BC/GOODY POWDER'S, (IBUPROFEN) MOTRIN, OR ALEVE (NAPROXEN), chemicals (including alcohol), SPICY FOODS, and medications. This illness may be associated with generalized malaise (feeling tired, not well), UPPER ABDOMINAL STOMACH cramps, and fever. One common bacterial cause of gastritis is an organism known as H. Pylori. This can be treated with antibiotics.     Hiatal Hernia A hiatal hernia occurs when a part of the  stomach slides above the diaphragm. The diaphragm is the thin muscle separating the belly (abdomen) from the chest. A hiatal hernia can be something you are born with or develop over time. Hiatal hernias may allow stomach acid to flow back into your esophagus, the tube which carries food from your mouth to your stomach. If this acid causes problems it is called GERD (gastro-esophageal reflux disease).   SYMPTOMS Common symptoms of GERD are heartburn (burning in your chest). This is worse when lying down or bending over. It may also cause belching and indigestion. Some of the things which make GERD worse are:  Increased weight pushes on stomach making acid rise more easily.   Smoking markedly increases acid production.   Alcohol decreases lower esophageal sphincter pressure (valve between stomach and esophagus), allowing acid from stomach into esophagus.   Late evening meals and going to bed with a full stomach increases pressure.   High-Fiber Diet A high-fiber diet changes your normal diet to include more whole grains, legumes, fruits, and vegetables. Changes in the diet involve replacing refined carbohydrates with unrefined foods. The calorie level of the diet is essentially unchanged. The Dietary Reference Intake (recommended amount) for adult males is 38 grams per day. For adult females, it is 25 grams per day. Pregnant and lactating women should consume 28 grams of fiber per day. Fiber is the intact part of a plant that is not broken down during digestion. Functional fiber is fiber that has been isolated from the plant to provide a beneficial effect in the body.  PURPOSE  Increase stool bulk.   Ease and regulate bowel movements.   Lower cholesterol.   REDUCE RISK OF COLON CANCER  INDICATIONS THAT YOU NEED MORE FIBER  Constipation and hemorrhoids.   Uncomplicated diverticulosis (intestine condition) and irritable bowel syndrome.   Weight management.   As a protective measure  against hardening of the arteries (atherosclerosis), diabetes, and cancer.   GUIDELINES FOR INCREASING FIBER IN THE DIET  Start adding fiber to the diet slowly. A gradual increase of about 5 more grams (2 slices of whole-wheat bread, 2 servings of most fruits or vegetables, or 1 bowl of high-fiber cereal) per day is best. Too rapid an increase in fiber may result in constipation, flatulence, and bloating.   Drink enough water and fluids to keep your urine clear or pale yellow. Water, juice, or caffeine-free drinks are recommended. Not drinking enough fluid may cause constipation.   Eat a variety of high-fiber foods rather than one type of fiber.   Try to increase your intake of fiber through using high-fiber foods rather than fiber pills or supplements that contain small amounts of fiber.   The goal is to change the types of food eaten. Do not supplement your present diet with high-fiber foods, but replace foods in your present diet.   INCLUDE A VARIETY OF FIBER SOURCES  Replace refined and processed grains with whole grains, canned fruits with fresh  fruits, and incorporate other fiber sources. White rice, white breads, and most bakery goods contain little or no fiber.   Brown whole-grain rice, buckwheat oats, and many fruits and vegetables are all good sources of fiber. These include: broccoli, Brussels sprouts, cabbage, cauliflower, beets, sweet potatoes, white potatoes (skin on), carrots, tomatoes, eggplant, squash, berries, fresh fruits, and dried fruits.   Cereals appear to be the richest source of fiber. Cereal fiber is found in whole grains and bran. Bran is the fiber-rich outer coat of cereal grain, which is largely removed in refining. In whole-grain cereals, the bran remains. In breakfast cereals, the largest amount of fiber is found in those with "bran" in their names. The fiber content is sometimes indicated on the label.   You may need to include additional fruits and vegetables  each day.   In baking, for 1 cup white flour, you may use the following substitutions:   1 cup whole-wheat flour minus 2 tablespoons.   1/2 cup white flour plus 1/2 cup whole-wheat flour.   Polyps, Colon  A polyp is extra tissue that grows inside your body. Colon polyps grow in the large intestine. The large intestine, also called the colon, is part of your digestive system. It is a long, hollow tube at the end of your digestive tract where your body makes and stores stool. Most polyps are not dangerous. They are benign. This means they are not cancerous. But over time, some types of polyps can turn into cancer. Polyps that are smaller than a pea are usually not harmful. But larger polyps could someday become or may already be cancerous. To be safe, doctors remove all polyps and test them.   WHO GETS POLYPS? Anyone can get polyps, but certain people are more likely than others. You may have a greater chance of getting polyps if:  You are over 50.   You have had polyps before.   Someone in your family has had polyps.   Someone in your family has had cancer of the large intestine.   Find out if someone in your family has had polyps. You may also be more likely to get polyps if you:   Eat a lot of fatty foods   Smoke   Drink alcohol   Do not exercise  Eat too much     PREVENTION There is not one sure way to prevent polyps. You might be able to lower your risk of getting them if you:  Eat more fruits and vegetables and less fatty food.   Do not smoke.   Avoid alcohol.   Exercise every day.   Lose weight if you are overweight.   Eating more calcium and folate can also lower your risk of getting polyps. Some foods that are rich in calcium are milk, cheese, and broccoli. Some foods that are rich in folate are chickpeas, kidney beans, and spinach.

## 2017-09-24 NOTE — Op Note (Signed)
[facility] Patient Name: Juan Ray Procedure Date: 09/24/2017 7:20 AM MRN: [id number] Date of Birth: 1960-01-05 Attending MD: Barney Drain MD, MD CSN: [id number] Age: 57 Admit Type: Outpatient Procedure:                Colonoscopy WITH COLD SNARE POLYPECTOMY Indications:              Screening for colorectal malignant neoplasm Providers:                Barney Drain MD, MD, Rosina Lowenstein, RN, Randa Spike, Technician Referring MD:             Cammie Mcgee. [place] Medicines:                Propofol per Anesthesia Complications:            No immediate complications. Estimated Blood Loss:     Estimated blood loss was minimal. Procedure:                Pre-Anesthesia Assessment:                           - Prior to the procedure, a History and Physical                            was performed, and patient medications and                            allergies were reviewed. The patient's tolerance of                            previous anesthesia was also reviewed. The risks                            and benefits of the procedure and the sedation                            options and risks were discussed with the patient.                            All questions were answered, and informed consent                            was obtained. Prior Anticoagulants: The patient has                            taken aspirin, last dose was 1 day prior to                            procedure. ASA Grade Assessment: II - A patient                            with mild systemic disease. After reviewing the  risks and benefits, the patient was deemed in                            satisfactory condition to undergo the procedure.                            After obtaining informed consent, the colonoscope                            was passed under direct vision. Throughout the                            procedure, the patient's blood pressure,  pulse, and                            oxygen saturations were monitored continuously. The                            [id number] ([id number] scope was introduced through                            the anus and advanced to the the cecum, identified                            by appendiceal orifice and ileocecal valve. The                            colonoscopy was somewhat difficult due to poor                            endoscopic visualization and a tortuous colon.                            Successful completion of the procedure was aided by                            straightening and shortening the scope to obtain                            bowel loop reduction, lavage and COLOWRAP. The                            patient tolerated the procedure well. The quality                            of the bowel preparation was good except the                            ascending colon was poor and the cecum was fair.                            The ileocecal valve, appendiceal orifice, and  rectum were photographed. Scope In: 0:48:88 AM Scope Out: 8:04:03 AM Scope Withdrawal Time: 0 hours 15 minutes 4 seconds  Total Procedure Duration: 0 hours 17 minutes 16 seconds  Findings:      Five sessile polyps were found in the transverse colon and hepatic       flexure. The polyps were 3 to 4 mm in size. These polyps were removed       with a cold snare. Resection and retrieval were complete.      The recto-sigmoid colon and sigmoid colon were mildly tortuous.      External and internal hemorrhoids were found. The hemorrhoids were       moderate. Impression:               - Five 3 to 4 mm polyps in the transverse colon(4)                            and at the hepatic flexure, removed with a cold                            snare. Resected and retrieved.                           - Tortuous MILD colon.                           - MODERATE External and internal hemorrhoids. Moderate  Sedation:      Per Anesthesia Care Recommendation:           - Patient has a contact number available for                            emergencies. The signs and symptoms of potential                            delayed complications were discussed with the                            patient. Return to normal activities tomorrow.                            Written discharge instructions were provided to the                            patient.                           - High fiber diet and low fat diet.                           - Continue present medications.                           - Await pathology results.                           - Repeat colonoscopy date to be determined after  pending pathology results are reviewed for                            surveillance based on pathology results. Procedure Code(s):        --- Professional ---                           [phone], Colonoscopy, flexible; with removal of                            tumor(s), polyp(s), or other lesion(s) by snare                            technique Diagnosis Code(s):        --- Professional ---                           Z12.11, Encounter for screening for malignant                            neoplasm of colon                           D12.3, Benign neoplasm of transverse colon (hepatic                            flexure or splenic flexure)                           K64.8, Other hemorrhoids                           Q43.8, Other specified congenital malformations of                            intestine CPT copyright 2017 [employer]. All rights reserved. The codes documented in this report are preliminary and upon coder review may  be revised to meet current compliance requirements. Barney Drain, MD Barney Drain MD, MD 09/24/2017 8:35:24 AM This report has been signed electronically. Number of Addenda: 0

## 2017-09-24 NOTE — H&P (Addendum)
Primary Care Physician:  Susy Frizzle, MD Primary Gastroenterologist:  Dr. Oneida Alar  Pre-Procedure History & Physical: HPI:  Juan Ray is a 57 y.o. male here for SCREENING: COLON CA/PUD.  Past Medical History:  Diagnosis Date  . Anxiety   . Arthritis   . CAD (coronary artery disease) 04/07/2012   a. Inferoposterior STEMI s/p DES-mid LCx. b. s/p CABG 04/2016.  . Diabetes mellitus   . Diabetic neuropathy ([place])   . GERD (gastroesophageal reflux disease)   . High cholesterol   . History of kidney stones   . Hypertension   . Ischemic cardiomyopathy    a. EF not totally clear - in 04/2016 was 35-40% by cath, 55-60% by echo, 40-45% by TEE all around same time.  . Myocardial infarction ([place]) 2013  . Neuropathy   . Obesity   . Pancreatitis    a. mild by CT 06/2011  . Peptic ulcer disease    a. 06/2009 EGD: multiple gastric and duodenal ulcers.    Past Surgical History:  Procedure Laterality Date  . CORONARY ANGIOPLASTY WITH STENT PLACEMENT  04/07/2012   70% prox LAD, 70-80% ostial diagonal, 70% mid-distal LAD, 80% prox OM1, mid LCx totally occluded just distal to OM1 s/p DES, occluded RCA; severe inferior HK, LVEF 45%  . CORONARY ARTERY BYPASS GRAFT N/A 04/23/2016   Procedure: CORONARY ARTERY BYPASS GRAFTING (CABG) x 4;  Surgeon: Gaye Pollack, MD;  Location: [facility];  Service: Open Heart Surgery;  Laterality: N/A;  . ENDOVEIN HARVEST OF GREATER SAPHENOUS VEIN Right 04/23/2016   Procedure: ENDOVEIN HARVEST OF GREATER SAPHENOUS VEIN;  Surgeon: Gaye Pollack, MD;  Location: [facility];  Service: Open Heart Surgery;  Laterality: Right;  . ESOPHAGOGASTRODUODENOSCOPY  06/18/2011   multiple ulcers in proximal stomach with stigmata of bleeding s/p biopsy, multiple superficial ulcers in duodenal bulb. normal ampulla and second portion of duodenum  . ESOPHAGOGASTRODUODENOSCOPY  2013  . INTRAVASCULAR PRESSURE WIRE/FFR STUDY N/A 04/13/2016   Procedure: Intravascular Pressure Wire/FFR Study;  Surgeon:  Nelva Bush, MD;  Location: [facility];  Service: Cardiovascular;  Laterality: N/A;  . LEFT HEART CATH AND CORONARY ANGIOGRAPHY N/A 04/13/2016   Procedure: Left Heart Cath and Coronary Angiography;  Surgeon: Nelva Bush, MD;  Location: [facility];  Service: Cardiovascular;  Laterality: N/A;  . LEFT HEART CATHETERIZATION WITH CORONARY ANGIOGRAM N/A 04/07/2012   Procedure: LEFT HEART CATHETERIZATION WITH CORONARY ANGIOGRAM;  Surgeon: Sherren Mocha, MD;  Location: [facility] CATH [facility];  Service: Cardiovascular;  Laterality: N/A;  . PERCUTANEOUS CORONARY STENT INTERVENTION (PCI-S)  04/07/2012   Procedure: PERCUTANEOUS CORONARY STENT INTERVENTION (PCI-S);  Surgeon: Sherren Mocha, MD;  Location: [facility] CATH LAB;  Service: Cardiovascular;;  . TEE WITHOUT CARDIOVERSION N/A 04/23/2016   Procedure: TRANSESOPHAGEAL ECHOCARDIOGRAM (TEE);  Surgeon: Gaye Pollack, MD;  Location: [facility];  Service: Open Heart Surgery;  Laterality: N/A;    Prior to Admission medications   Medication Sig Start Date End Date Taking? Authorizing Provider  aspirin EC 81 MG EC tablet Take 1 tablet (81 mg total) by mouth daily. 04/09/12  Yes Arguello, Roger A, PA-C  atorvastatin (LIPITOR) 40 MG tablet TAKE 1 TABLET BY MOUTH ONCE DAILY Patient taking differently: Take 40 mg by mouth daily.  06/12/17  Yes Turner, Traci R, MD  escitalopram (LEXAPRO) 10 MG tablet TAKE 1 TABLET BY MOUTH ONCE DAILY Patient taking differently: Take 10 mg by mouth at bedtime.  08/21/17  Yes Susy Frizzle, MD  furosemide (LASIX) 20  MG tablet Take 20 mg by mouth daily as needed for fluid.   Yes [provider]  gabapentin (NEURONTIN) 600 MG tablet TAKE 1 TABLET BY MOUTH THREE TIMES DAILY Patient taking differently: Take 600 mg by mouth 3 (three) times daily.  12/31/16  Yes PickardCammie Mcgee, MD  GAVILYTE-N WITH FLAVOR PACK 420 g solution Take 4,000 mLs by mouth once. 07/03/17  Yes [provider]  JARDIANCE 25 MG TABS tablet TAKE 1  TABLET BY MOUTH ONCE DAILY Patient taking differently: Take 25 mg by mouth daily.  06/03/17  Yes Susy Frizzle, MD  lisinopril (PRINIVIL,ZESTRIL) 2.5 MG tablet TAKE 1 TABLET BY MOUTH ONCE DAILY Patient taking differently: Take 2.5 mg by mouth daily.  06/12/17  Yes Bhagat, Bhavinkumar, PA  metFORMIN (GLUCOPHAGE) 1000 MG tablet TAKE 1 TABLET BY MOUTH TWICE DAILY 09/19/17  Yes Susy Frizzle, MD  metoprolol tartrate (LOPRESSOR) 25 MG tablet TAKE 1 TABLET BY MOUTH TWICE DAILY Patient taking differently: Take 25 mg by mouth 2 (two) times daily.  05/02/17  Yes Bhagat, Bhavinkumar, PA  oxyCODONE (OXY IR/ROXICODONE) 5 MG immediate release tablet Take 1-2 tablets (5-10 mg total) by mouth every 3 (three) hours as needed for severe pain. Chronic Pain. Dx: G89.4, G62.9 09/11/17  Yes Dena Billet B, PA-C  pantoprazole (PROTONIX) 40 MG tablet TAKE 1 TABLET BY MOUTH ONCE DAILY 05/06/17  Yes Bhagat, Bhavinkumar, PA  tadalafil (CIALIS) 20 MG tablet Take 0.5-1 tablets (10-20 mg total) by mouth every other day as needed for erectile dysfunction. 04/05/17  Yes Susy Frizzle, MD  Insulin Pen Needle 32G X 4 MM MISC Use with Victoza daily 04/08/17   Susy Frizzle, MD  nitroGLYCERIN (NITROSTAT) 0.4 MG SL tablet Place 1 tablet (0.4 mg total) under the tongue every 5 (five) minutes as needed for chest pain. X 3 doses 04/13/16   End, Harrell Gave, MD    Allergies as of 07/03/2017 - Review Complete 07/03/2017  Allergen Reaction Noted  . No known allergies  04/20/2016    Family History  Problem Relation Age of Onset  . Heart attack Father        MI x 2 in late 50's, currently 55's  . CVA Sister        late 65s  . CAD Maternal Uncle        MI at 15  . Colon cancer Neg Hx   . Colon polyps Neg Hx     Social History   Socioeconomic History  . Marital status: Married    Spouse name: Not on file  . Number of children: 2  . Years of education: Not on file  . Highest education level: Not on file  Occupational  History  . Occupation: truck Animator Needs  . Financial resource strain: Not on file  . Food insecurity:    Worry: Not on file    Inability: Not on file  . Transportation needs:    Medical: Not on file    Non-medical: Not on file  Tobacco Use  . Smoking status: Former Smoker    Packs/day: 1.00    Years: 20.00    Pack years: 20.00    Types: Cigarettes    Last attempt to quit: 2000    Years since quitting: 19.7  . Smokeless tobacco: Former Systems developer    Quit date: 01/01/1998  Substance and Sexual Activity  . Alcohol use: No  . Drug use: No  . Sexual activity: Yes  Lifestyle  .  Physical activity:    Days per week: Not on file    Minutes per session: Not on file  . Stress: Not on file  Relationships  . Social connections:    Talks on phone: Not on file    Gets together: Not on file    Attends religious service: Not on file    Active member of club or organization: Not on file    Attends meetings of clubs or organizations: Not on file    Relationship status: Not on file  . Intimate partner violence:    Fear of current or ex partner: Not on file    Emotionally abused: Not on file    Physically abused: Not on file    Forced sexual activity: Not on file  Other Topics Concern  . Not on file  Social History Narrative   Lives in [place], with his wife and 2 children    Review of Systems: See HPI, otherwise negative ROS   Physical Exam: BP (!) 145/81   Pulse (!) 59   Temp 98.2 F (36.8 C) (Oral)   Resp 13   SpO2 99%  General:   Alert,  pleasant and cooperative in NAD Head:  Normocephalic and atraumatic. Neck:  Supple; Lungs:  Clear throughout to auscultation.    Heart:  Regular rate and rhythm. Abdomen:  Soft, nontender and nondistended. Normal bowel sounds, without guarding, and without rebound.   Neurologic:  Alert and  oriented x4;  grossly normal neurologically.  Impression/Plan:   SCREENING/PUD  Plan:  1. TCS/EGD TODAY DISCUSSED PROCEDURE, BENEFITS,  & RISKS: < 1% chance of medication reaction, bleeding, perforation, or rupture of spleen/liver.

## 2017-09-26 ENCOUNTER — Encounter (HOSPITAL_COMMUNITY): Payer: Self-pay | Admitting: Gastroenterology

## 2017-09-26 NOTE — Progress Notes (Signed)
PT is aware.

## 2017-09-27 NOTE — Progress Notes (Signed)
CC'D TO PCP AND ON RECALL  °

## 2017-10-09 ENCOUNTER — Encounter: Payer: Self-pay | Admitting: Family Medicine

## 2017-10-09 ENCOUNTER — Other Ambulatory Visit: Payer: Self-pay | Admitting: Physician Assistant

## 2017-10-09 NOTE — Telephone Encounter (Signed)
Last OV 04/05/2017 Last refill 09/11/2017 Ok to refill?

## 2017-10-09 NOTE — Telephone Encounter (Signed)
Overdue for OV.  He sees Dr. Dennard Schaumann.  Last ROV was 04/05/2017.  A1c was elevated and uncontrolled.  Needs to come in for OV.

## 2017-10-10 MED ORDER — OXYCODONE HCL 5 MG PO TABS: 5.0000 mg | ORAL_TABLET | ORAL | 0 refills | Status: DC | PRN

## 2017-10-15 ENCOUNTER — Ambulatory Visit: Payer: BLUE CROSS/BLUE SHIELD | Admitting: Family Medicine

## 2017-10-15 ENCOUNTER — Encounter: Payer: Self-pay | Admitting: Family Medicine

## 2017-10-15 VITALS — BP 114/77 | HR 66 | Temp 97.1°F | Ht 69.0 in | Wt 243.4 lb

## 2017-10-15 DIAGNOSIS — I10 Essential (primary) hypertension: Secondary | ICD-10-CM

## 2017-10-15 DIAGNOSIS — I25119 Atherosclerotic heart disease of native coronary artery with unspecified angina pectoris: Secondary | ICD-10-CM

## 2017-10-15 DIAGNOSIS — E7800 Pure hypercholesterolemia, unspecified: Secondary | ICD-10-CM

## 2017-10-15 DIAGNOSIS — M1710 Unilateral primary osteoarthritis, unspecified knee: Secondary | ICD-10-CM | POA: Diagnosis not present

## 2017-10-15 DIAGNOSIS — E1142 Type 2 diabetes mellitus with diabetic polyneuropathy: Secondary | ICD-10-CM

## 2017-10-15 LAB — BAYER DCA HB A1C WAIVED: HB A1C (BAYER DCA - WAIVED): 8.6 % — ABNORMAL HIGH (ref <7.0)

## 2017-10-15 MED ORDER — DULAGLUTIDE 0.75 MG/0.5ML ~~LOC~~ SOAJ: 0.5000 mL | SUBCUTANEOUS | 1 refills | Status: DC

## 2017-10-15 MED ORDER — FREESTYLE LIBRE 14 DAY READER DEVI: 1.0000 | Freq: Two times a day (BID) | 0 refills | Status: DC

## 2017-10-15 MED ORDER — FREESTYLE LIBRE SENSOR SYSTEM MISC: 1.0000 [IU] | Freq: Two times a day (BID) | 11 refills | Status: DC

## 2017-10-15 NOTE — Progress Notes (Signed)
Subjective:  Patient ID: Juan Ray, male    DOB: Aug 24, 1960  Age: 57 y.o. MRN: [id number]  CC: New Patient (Initial Visit)   HPI Juan Ray presents for new patient evaluation.  He has multiple medical issues including diabetes.  He had a bypass surgery in April 2018.  He is here for management of his risk factors.  Lately his glucose has been running 200-2 20.  Does not have a regular exercise regimen.  Not following a carb controlled diet.  Does not recall a recent A1c..  Patient also has a consultation with orthopedics upcoming for treatment of chronic knee pain.  Patient medications reviewed.  Using metformin and Jardiance regular.  Patient in for follow-up of GERD. Currently asymptomatic taking  PPI daily. There is no chest pain or heartburn. No hematemesis and no melena. No dysphagia or choking. Onset is remote. Progression is stable. Complicating factors, none.  Also  in for follow-up of elevated cholesterol. Doing well without complaints on current medication. Denies side effects of statin including myalgia and arthralgia and nausea. Also in today for liver function testing. Currently no chest pain, shortness of breath or other cardiovascular related symptoms noted. Depression screen [facility] 2/9 10/15/2017 04/05/2017 03/15/2016  Decreased Interest - 0 0  Down, Depressed, Hopeless 1 0 0  PHQ - 2 Score 1 0 0  Altered sleeping 3 - 0  Tired, decreased energy 3 - 0  Change in appetite 1 - 0  Feeling bad or failure about yourself  1 - 0  Trouble concentrating 0 - 0  Moving slowly or fidgety/restless 0 - 0  Suicidal thoughts 0 - 0  PHQ-9 Score 9 - 0  Difficult doing work/chores Somewhat difficult - Not difficult at all    History Juan Ray has a past medical history of Anxiety, Arthritis, CAD (coronary artery disease) (04/07/2012), Diabetes mellitus, Diabetic neuropathy ([place]), GERD (gastroesophageal reflux disease), High cholesterol, History of kidney stones, Hypertension, Ischemic  cardiomyopathy, Myocardial infarction ([place]) (2013), Neuropathy, Obesity, Pancreatitis, and Peptic ulcer disease.   He has a past surgical history that includes Esophagogastroduodenoscopy (06/18/2011); Coronary angioplasty with stent (04/07/2012); left heart catheterization with coronary angiogram (N/A, 04/07/2012); percutaneous coronary stent intervention (pci-s) (04/07/2012); LEFT HEART CATH AND CORONARY ANGIOGRAPHY (N/A, 04/13/2016); INTRAVASCULAR PRESSURE WIRE/FFR STUDY (N/A, 04/13/2016); Coronary artery bypass graft (N/A, 04/23/2016); TEE without cardioversion (N/A, 04/23/2016); Endoharvest vein of greater saphenous vein (Right, 04/23/2016); Esophagogastroduodenoscopy (2013); Colonoscopy with propofol (N/A, 09/24/2017); Esophagogastroduodenoscopy (egd) with propofol (N/A, 09/24/2017); polypectomy (09/24/2017); and biopsy (09/24/2017).   His family history includes CAD in his maternal uncle; CVA in his sister; Heart attack in his father.He reports that he quit smoking about 19 years ago. His smoking use included cigarettes. He has a 20.00 pack-year smoking history. He quit smokeless tobacco use about 19 years ago. He reports that he does not drink alcohol or use drugs.    ROS Review of Systems  Constitutional: Negative.   HENT: Negative.   Eyes: Negative for visual disturbance.  Respiratory: Negative for cough and shortness of breath.   Cardiovascular: Negative for chest pain and leg swelling.  Gastrointestinal: Negative for abdominal pain, diarrhea, nausea and vomiting.  Genitourinary: Negative for difficulty urinating.  Musculoskeletal: Positive for arthralgias (knees). Negative for myalgias.  Skin: Negative for rash.  Neurological: Negative for headaches.  Psychiatric/Behavioral: Negative for sleep disturbance.    Objective:  BP 114/77   Pulse 66   Temp (!) 97.1 F (36.2 C) (Oral)   Ht 5' 9"  (1.753 m)  Wt 243 lb 6.4 oz (110.4 kg)   BMI 35.94 kg/m   BP Readings from Last 3 Encounters:    10/15/17 114/77  09/24/17 130/74  09/18/17 124/78    Wt Readings from Last 3 Encounters:  10/15/17 243 lb 6.4 oz (110.4 kg)  09/18/17 240 lb (108.9 kg)  07/03/17 246 lb 3.2 oz (111.7 kg)     Physical Exam  Constitutional: He is oriented to person, place, and time. He appears well-developed and well-nourished. No distress.  Obese   HENT:  Head: Normocephalic and atraumatic.  Right Ear: External ear normal.  Left Ear: External ear normal.  Nose: Nose normal.  Mouth/Throat: Oropharynx is clear and moist.  Eyes: Pupils are equal, round, and reactive to light. Conjunctivae and EOM are normal.  Neck: Normal range of motion. Neck supple.  Cardiovascular: Normal rate, regular rhythm and normal heart sounds.  No murmur heard. Pulmonary/Chest: Effort normal and breath sounds normal. No respiratory distress. He has no wheezes. He has no rales.  Abdominal: Soft. There is no tenderness.  Musculoskeletal: Normal range of motion.  Neurological: He is alert and oriented to person, place, and time. He has normal reflexes.  Skin: Skin is warm and dry.  Psychiatric: He has a normal mood and affect. His behavior is normal. Judgment and thought content normal.      Assessment & Plan:   Juan Ray was seen today for new patient (initial visit).  Diagnoses and all orders for this visit:  Diabetic polyneuropathy associated with type 2 diabetes mellitus ([place]) -     Ambulatory referral to Orthopedics -     Ambulatory referral to Pain Clinic -     CBC with Differential/Platelet -     CMP14+EGFR -     Lipid panel -     Bayer DCA Hb A1c Waived  Arthritis of knee -     CBC with Differential/Platelet -     CMP14+EGFR -     Lipid panel -     Bayer DCA Hb A1c Waived  Coronary artery disease involving native coronary artery of native heart with angina pectoris ([place])  Essential hypertension  Pure hypercholesterolemia  Other orders -     Dulaglutide (TRULICITY) 8.78 MV/6.7MC SOPN; Inject 0.5  mLs into the skin once a week. -     Continuous Blood Gluc Receiver (FREESTYLE LIBRE 14 DAY READER) DEVI; 1 Dose by Does not apply route 2 (two) times daily. -     Continuous Blood Gluc Sensor (FREESTYLE LIBRE SENSOR SYSTEM) MISC; 1 Units by Does not apply route 2 (two) times daily.       I am having Juan Ray start on Dulaglutide, FREESTYLE LIBRE 14 DAY READER, and [place]. I am also having him maintain his aspirin, nitroGLYCERIN, gabapentin, tadalafil, Insulin Pen Needle, metoprolol tartrate, pantoprazole, JARDIANCE, atorvastatin, lisinopril, escitalopram, furosemide, metFORMIN, and oxyCODONE.  Allergies as of 10/15/2017      Reactions   No Known Allergies       Medication List        Accurate as of 10/15/17 11:59 PM. Always use your most recent med list.          aspirin 81 MG EC tablet Take 1 tablet (81 mg total) by mouth daily.   atorvastatin 40 MG tablet Commonly known as:  LIPITOR TAKE 1 TABLET BY MOUTH ONCE DAILY   Dulaglutide 0.75 MG/0.5ML Sopn Inject 0.5 mLs into the skin once a week.   escitalopram 10 MG tablet Commonly  known as:  LEXAPRO TAKE 1 TABLET BY MOUTH ONCE DAILY   FREESTYLE LIBRE 14 DAY READER Devi 1 Dose by Does not apply route 2 (two) times daily.   FREESTYLE LIBRE SENSOR SYSTEM Misc 1 Units by Does not apply route 2 (two) times daily.   furosemide 20 MG tablet Commonly known as:  LASIX Take 20 mg by mouth daily as needed for fluid.   gabapentin 600 MG tablet Commonly known as:  NEURONTIN TAKE 1 TABLET BY MOUTH THREE TIMES DAILY   Insulin Pen Needle 32G X 4 MM Misc Use with Victoza daily   JARDIANCE 25 MG Tabs tablet Generic drug:  empagliflozin TAKE 1 TABLET BY MOUTH ONCE DAILY   lisinopril 2.5 MG tablet Commonly known as:  PRINIVIL,ZESTRIL TAKE 1 TABLET BY MOUTH ONCE DAILY   metFORMIN 1000 MG tablet Commonly known as:  GLUCOPHAGE TAKE 1 TABLET BY MOUTH TWICE DAILY   metoprolol tartrate 25 MG  tablet Commonly known as:  LOPRESSOR TAKE 1 TABLET BY MOUTH TWICE DAILY   nitroGLYCERIN 0.4 MG SL tablet Commonly known as:  NITROSTAT Place 1 tablet (0.4 mg total) under the tongue every 5 (five) minutes as needed for chest pain. X 3 doses   oxyCODONE 5 MG immediate release tablet Commonly known as:  Oxy IR/ROXICODONE Take 1-2 tablets (5-10 mg total) by mouth every 3 (three) hours as needed for severe pain. Chronic Pain. Dx: G89.4, G62.9   pantoprazole 40 MG tablet Commonly known as:  PROTONIX TAKE 1 TABLET BY MOUTH ONCE DAILY   tadalafil 20 MG tablet Commonly known as:  ADCIRCA/CIALIS Take 0.5-1 tablets (10-20 mg total) by mouth every other day as needed for erectile dysfunction.      Weight los for knees, diabetes recommended  Follow-up: Return in about 1 month (around 11/15/2017).  Claretta Fraise, M.D.

## 2017-10-16 LAB — CMP14+EGFR
ALT: 27 IU/L (ref 0–44)
AST: 21 IU/L (ref 0–40)
Albumin/Globulin Ratio: 2 (ref 1.2–2.2)
Albumin: 4.4 g/dL (ref 3.5–5.5)
Alkaline Phosphatase: 85 IU/L (ref 39–117)
BUN/Creatinine Ratio: 19 (ref 9–20)
BUN: 17 mg/dL (ref 6–24)
Bilirubin Total: 0.5 mg/dL (ref 0.0–1.2)
CALCIUM: 9.3 mg/dL (ref 8.7–10.2)
CO2: 24 mmol/L (ref 20–29)
CREATININE: 0.89 mg/dL (ref 0.76–1.27)
Chloride: 101 mmol/L (ref 96–106)
GFR calc Af Amer: 109 mL/min/{1.73_m2} (ref >59)
GFR, EST NON AFRICAN AMERICAN: 94 mL/min/{1.73_m2} (ref >59)
GLOBULIN, TOTAL: 2.2 g/dL (ref 1.5–4.5)
Glucose: 179 mg/dL — ABNORMAL HIGH (ref 65–99)
Potassium: 4.1 mmol/L (ref 3.5–5.2)
SODIUM: 142 mmol/L (ref 134–144)
Total Protein: 6.6 g/dL (ref 6.0–8.5)

## 2017-10-16 LAB — CBC WITH DIFFERENTIAL/PLATELET
Basophils Absolute: 0.1 10*3/uL (ref 0.0–0.2)
Basos: 1 %
EOS (ABSOLUTE): 0.3 10*3/uL (ref 0.0–0.4)
Eos: 3 %
HEMOGLOBIN: 15.9 g/dL (ref 13.0–17.7)
Hematocrit: 45.5 % (ref 37.5–51.0)
IMMATURE GRANULOCYTES: 1 %
Immature Grans (Abs): 0 10*3/uL (ref 0.0–0.1)
LYMPHS: 35 %
Lymphocytes Absolute: 2.9 10*3/uL (ref 0.7–3.1)
MCH: 32.6 pg (ref 26.6–33.0)
MCHC: 34.9 g/dL (ref 31.5–35.7)
MCV: 93 fL (ref 79–97)
MONOCYTES: 14 %
MONOS ABS: 1.2 10*3/uL — AB (ref 0.1–0.9)
Neutrophils Absolute: 3.8 10*3/uL (ref 1.4–7.0)
Neutrophils: 46 %
Platelets: 253 10*3/uL (ref 150–450)
RBC: 4.88 x10E6/uL (ref 4.14–5.80)
RDW: 13 % (ref 12.3–15.4)
WBC: 8.3 10*3/uL (ref 3.4–10.8)

## 2017-10-16 LAB — LIPID PANEL
CHOL/HDL RATIO: 4.8 ratio (ref 0.0–5.0)
Cholesterol, Total: 181 mg/dL (ref 100–199)
HDL: 38 mg/dL — AB (ref >39)
LDL CALC: 94 mg/dL (ref 0–99)
Triglycerides: 244 mg/dL — ABNORMAL HIGH (ref 0–149)
VLDL CHOLESTEROL CAL: 49 mg/dL — AB (ref 5–40)

## 2017-10-17 DIAGNOSIS — H25811 Combined forms of age-related cataract, right eye: Secondary | ICD-10-CM | POA: Diagnosis not present

## 2017-10-17 DIAGNOSIS — E119 Type 2 diabetes mellitus without complications: Secondary | ICD-10-CM | POA: Diagnosis not present

## 2017-10-17 DIAGNOSIS — H2511 Age-related nuclear cataract, right eye: Secondary | ICD-10-CM | POA: Diagnosis not present

## 2017-10-30 DIAGNOSIS — M7541 Impingement syndrome of right shoulder: Secondary | ICD-10-CM | POA: Diagnosis not present

## 2017-10-30 DIAGNOSIS — M25561 Pain in right knee: Secondary | ICD-10-CM | POA: Diagnosis not present

## 2017-10-30 DIAGNOSIS — M25562 Pain in left knee: Secondary | ICD-10-CM | POA: Diagnosis not present

## 2017-10-30 DIAGNOSIS — M25569 Pain in unspecified knee: Secondary | ICD-10-CM | POA: Insufficient documentation

## 2017-10-31 DIAGNOSIS — M7541 Impingement syndrome of right shoulder: Secondary | ICD-10-CM | POA: Insufficient documentation

## 2017-10-31 DIAGNOSIS — E1142 Type 2 diabetes mellitus with diabetic polyneuropathy: Secondary | ICD-10-CM | POA: Insufficient documentation

## 2017-11-06 ENCOUNTER — Encounter: Payer: Self-pay | Admitting: Physical Medicine & Rehabilitation

## 2017-11-07 ENCOUNTER — Encounter: Payer: Self-pay | Admitting: *Deleted

## 2017-11-08 DIAGNOSIS — H25812 Combined forms of age-related cataract, left eye: Secondary | ICD-10-CM | POA: Diagnosis not present

## 2017-11-08 DIAGNOSIS — H2512 Age-related nuclear cataract, left eye: Secondary | ICD-10-CM | POA: Diagnosis not present

## 2017-11-14 ENCOUNTER — Ambulatory Visit (INDEPENDENT_AMBULATORY_CARE_PROVIDER_SITE_OTHER): Payer: BLUE CROSS/BLUE SHIELD

## 2017-11-14 ENCOUNTER — Encounter: Payer: Self-pay | Admitting: Family Medicine

## 2017-11-14 ENCOUNTER — Ambulatory Visit: Payer: BLUE CROSS/BLUE SHIELD | Admitting: Family Medicine

## 2017-11-14 DIAGNOSIS — M542 Cervicalgia: Secondary | ICD-10-CM

## 2017-11-14 DIAGNOSIS — S161XXA Strain of muscle, fascia and tendon at neck level, initial encounter: Secondary | ICD-10-CM | POA: Diagnosis not present
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
## 2017-11-15 ENCOUNTER — Ambulatory Visit: Payer: BLUE CROSS/BLUE SHIELD | Admitting: Family Medicine

## 2017-11-26 ENCOUNTER — Ambulatory Visit: Payer: BLUE CROSS/BLUE SHIELD | Admitting: Family Medicine

## 2017-11-26 ENCOUNTER — Encounter: Payer: Self-pay | Admitting: Family Medicine

## 2017-11-26 DIAGNOSIS — M26621 Arthralgia of right temporomandibular joint: Secondary | ICD-10-CM | POA: Diagnosis not present

## 2017-11-26 MED ORDER — SEMAGLUTIDE(0.25 OR 0.5MG/DOS) 2 MG/1.5ML ~~LOC~~ SOPN: 0.5000 mg | PEN_INJECTOR | SUBCUTANEOUS | 2 refills | Status: DC

## 2017-11-26 NOTE — Progress Notes (Signed)
Subjective:  Patient ID: Juan Ray, male    DOB: March 25, 1960  Age: 57 y.o. MRN: [id number]  CC: Follow-up (1 month)   HPI Juan Ray presents for Follow-up of diabetes. Had to DC Juan Ray due to severe nausea lasting 3 days after each injection. Med helped bring glucose down under 150 hike in use.  History Juan Ray has a past medical history of Anxiety, Arthritis, CAD (coronary artery disease) (04/07/2012), Diabetes mellitus, Diabetic neuropathy ([place]), GERD (gastroesophageal reflux disease), High cholesterol, History of kidney stones, Hypertension, Ischemic cardiomyopathy, Myocardial infarction ([place]) (2013), Neuropathy, Obesity, Pancreatitis, and Peptic ulcer disease.   He has a past surgical history that includes Esophagogastroduodenoscopy (06/18/2011); Coronary angioplasty with stent (04/07/2012); left heart catheterization with coronary angiogram (N/A, 04/07/2012); percutaneous coronary stent intervention (pci-s) (04/07/2012); LEFT HEART CATH AND CORONARY ANGIOGRAPHY (N/A, 04/13/2016); INTRAVASCULAR PRESSURE WIRE/FFR STUDY (N/A, 04/13/2016); Coronary artery bypass graft (N/A, 04/23/2016); TEE without cardioversion (N/A, 04/23/2016); Endoharvest vein of greater saphenous vein (Right, 04/23/2016); Esophagogastroduodenoscopy (2013); Colonoscopy with propofol (N/A, 09/24/2017); Esophagogastroduodenoscopy (egd) with propofol (N/A, 09/24/2017); polypectomy (09/24/2017); and biopsy (09/24/2017).   His family history includes CAD in his maternal uncle; CVA in his sister; Heart attack in his father.He reports that he quit smoking about 19 years ago. His smoking use included cigarettes. He has a 20.00 pack-year smoking history. He quit smokeless tobacco use about 19 years ago. He reports that he does not drink alcohol or use drugs.  Current Outpatient Medications on File Prior to Visit  Medication Sig Dispense Refill  . aspirin EC 81 MG EC tablet Take 1 tablet (81 mg total) by mouth daily.    Marland Kitchen atorvastatin  (LIPITOR) 40 MG tablet TAKE 1 TABLET BY MOUTH ONCE DAILY (Patient taking differently: Take 40 mg by mouth daily. ) 90 tablet 2  . Continuous Blood Gluc Receiver (FREESTYLE LIBRE 14 DAY READER) DEVI 1 Dose by Does not apply route 2 (two) times daily. 1 Device 0  . Continuous Blood Gluc Receiver (FREESTYLE LIBRE 14 DAY READER) DEVI FreeStyle Libre 14 Day Reader  USE AS DIRECTED TWICE DAILY    . Continuous Blood Gluc Sensor (FREESTYLE LIBRE SENSOR SYSTEM) MISC 1 Units by Does not apply route 2 (two) times daily. 2 each 11  . diclofenac sodium (VOLTAREN) 1 % GEL diclofenac 1 % topical gel    . Dulaglutide (TRULICITY) 9.47 SJ/6.2EZ SOPN Inject 0.5 mLs into the skin once a week. 4 pen 1  . escitalopram (LEXAPRO) 10 MG tablet TAKE 1 TABLET BY MOUTH ONCE DAILY (Patient taking differently: Take 10 mg by mouth at bedtime. ) 90 tablet 3  . furosemide (LASIX) 20 MG tablet Take 20 mg by mouth daily as needed for fluid.    Marland Kitchen gabapentin (NEURONTIN) 600 MG tablet TAKE 1 TABLET BY MOUTH THREE TIMES DAILY (Patient taking differently: Take 600 mg by mouth 3 (three) times daily. ) 90 tablet 5  . JARDIANCE 25 MG TABS tablet TAKE 1 TABLET BY MOUTH ONCE DAILY (Patient taking differently: Take 25 mg by mouth daily. ) 90 tablet 1  . liraglutide (VICTOZA) 18 MG/3ML SOPN Victoza 3-Pak 0.6 mg/0.1 mL (18 mg/3 mL) subcutaneous pen injector    . lisinopril (PRINIVIL,ZESTRIL) 2.5 MG tablet TAKE 1 TABLET BY MOUTH ONCE DAILY (Patient taking differently: Take 2.5 mg by mouth daily. ) 90 tablet 2  . metFORMIN (GLUCOPHAGE) 1000 MG tablet TAKE 1 TABLET BY MOUTH TWICE DAILY 180 tablet 1  . metoprolol tartrate (LOPRESSOR) 25 MG tablet TAKE 1 TABLET BY MOUTH TWICE  DAILY (Patient taking differently: Take 25 mg by mouth 2 (two) times daily. ) 180 tablet 2  . nitroGLYCERIN (NITROSTAT) 0.4 MG SL tablet Place 1 tablet (0.4 mg total) under the tongue every 5 (five) minutes as needed for chest pain. X 3 doses 25 tablet prn  . oxyCODONE (OXY  IR/ROXICODONE) 5 MG immediate release tablet Take 1-2 tablets (5-10 mg total) by mouth every 3 (three) hours as needed for severe pain. Chronic Pain. Dx: G89.4, G62.9 60 tablet 0  . pantoprazole (PROTONIX) 40 MG tablet TAKE 1 TABLET BY MOUTH ONCE DAILY 30 tablet 11  . polyethylene glycol-electrolytes (GAVILYTE-N WITH FLAVOR PACK) 420 g solution GaviLyte-N 420 gram oral solution    . tadalafil (CIALIS) 20 MG tablet Take 0.5-1 tablets (10-20 mg total) by mouth every other day as needed for erectile dysfunction. 100 tablet 0   No current facility-administered medications on file prior to visit.     ROS Review of Systems  Constitutional: Negative for fever.  Respiratory: Negative for shortness of breath.   Cardiovascular: Negative for chest pain.  Musculoskeletal: Positive for arthralgias (Right TMJ).  Skin: Negative for rash.    Objective:  BP 95/64 (BP Location: Left Arm)   Pulse 85   Temp 97.7 F (36.5 C)   Ht 5\' 9"  (1.753 m)   Wt 240 lb 9.6 oz (109.1 kg)   BMI 35.53 kg/m   BP Readings from Last 3 Encounters:  11/26/17 95/64  11/14/17 137/86  10/15/17 114/77    Wt Readings from Last 3 Encounters:  11/26/17 240 lb 9.6 oz (109.1 kg)  11/14/17 241 lb 8 oz (109.5 kg)  10/15/17 243 lb 6.4 oz (110.4 kg)     Physical Exam  Constitutional: He is oriented to person, place, and time. He appears well-developed and well-nourished.  HENT:  Head: Normocephalic and atraumatic.  Right Ear: External ear normal.  Left Ear: External ear normal.  Mouth/Throat: No oropharyngeal exudate or posterior oropharyngeal erythema.  Eyes: Pupils are equal, round, and reactive to light.  Neck: Normal range of motion. Neck supple.  Cardiovascular: Normal rate and regular rhythm.  No murmur heard. Pulmonary/Chest: Breath sounds normal. No respiratory distress.  Musculoskeletal: He exhibits tenderness (R TMJ).  Neurological: He is alert and oriented to person, place, and time.  Vitals  reviewed.     Assessment & Plan:   Juan Ray was seen today for follow-up.  Diagnoses and all orders for this visit:  Arthralgia of right temporomandibular joint -     CT MAXILLOFACIAL WO CONTRAST; Future  Other orders -     Semaglutide,0.25 or 0.5MG /DOS, (OZEMPIC, 0.25 OR 0.5 MG/DOSE,) 2 MG/1.5ML SOPN; Inject 0.5 mg into the skin once a week.      I am having Conlan Haertel start on Semaglutide(0.25 or 0.5MG /DOS). I am also having him maintain his aspirin, nitroGLYCERIN, gabapentin, tadalafil, metoprolol tartrate, pantoprazole, JARDIANCE, atorvastatin, lisinopril, escitalopram, furosemide, metFORMIN, oxyCODONE, Dulaglutide, FREESTYLE LIBRE 14 DAY READER, FREESTYLE LIBRE SENSOR SYSTEM, FREESTYLE LIBRE 14 DAY READER, liraglutide, polyethylene glycol-electrolytes, and diclofenac sodium.  Meds ordered this encounter  Medications  . Semaglutide,0.25 or 0.5MG /DOS, (OZEMPIC, 0.25 OR 0.5 MG/DOSE,) 2 MG/1.5ML SOPN    Sig: Inject 0.5 mg into the skin once a week.    Dispense:  1 pen    Refill:  2     Follow-up: Return in about 6 weeks (around 01/07/2018).  Claretta Fraise, M.D.

## 2017-11-29 ENCOUNTER — Other Ambulatory Visit: Payer: Self-pay | Admitting: Family Medicine

## 2017-12-01 ENCOUNTER — Encounter: Payer: Self-pay | Admitting: Family Medicine

## 2017-12-03 ENCOUNTER — Encounter: Payer: Self-pay | Admitting: Family Medicine

## 2017-12-04 ENCOUNTER — Encounter: Payer: Self-pay | Admitting: Family Medicine

## 2017-12-05 NOTE — Telephone Encounter (Signed)
I believe they are referring to ozempic? Will forward to PCP to address tomorrow.

## 2017-12-06 ENCOUNTER — Other Ambulatory Visit: Payer: Self-pay | Admitting: Family Medicine

## 2017-12-06 MED ORDER — SITAGLIPTIN PHOSPHATE 100 MG PO TABS: 100.0000 mg | ORAL_TABLET | Freq: Every day | ORAL | 2 refills | Status: DC

## 2017-12-10 ENCOUNTER — Other Ambulatory Visit: Payer: Self-pay | Admitting: *Deleted

## 2017-12-10 MED ORDER — GABAPENTIN 600 MG PO TABS: 600.0000 mg | ORAL_TABLET | Freq: Three times a day (TID) | ORAL | 0 refills | Status: DC

## 2017-12-16 ENCOUNTER — Other Ambulatory Visit: Payer: Self-pay

## 2017-12-16 ENCOUNTER — Encounter
Payer: BLUE CROSS/BLUE SHIELD | Attending: Physical Medicine & Rehabilitation | Admitting: Physical Medicine & Rehabilitation

## 2017-12-16 ENCOUNTER — Encounter: Payer: Self-pay | Admitting: Physical Medicine & Rehabilitation

## 2017-12-16 VITALS — BP 114/75 | HR 74 | Ht 69.0 in | Wt 239.0 lb

## 2017-12-16 DIAGNOSIS — Z8711 Personal history of peptic ulcer disease: Secondary | ICD-10-CM | POA: Diagnosis not present

## 2017-12-16 DIAGNOSIS — Z79891 Long term (current) use of opiate analgesic: Secondary | ICD-10-CM | POA: Diagnosis not present

## 2017-12-16 DIAGNOSIS — Z6835 Body mass index (BMI) 35.0-35.9, adult: Secondary | ICD-10-CM | POA: Diagnosis not present

## 2017-12-16 DIAGNOSIS — Z5181 Encounter for therapeutic drug level monitoring: Secondary | ICD-10-CM

## 2017-12-16 DIAGNOSIS — M25511 Pain in right shoulder: Secondary | ICD-10-CM | POA: Diagnosis not present

## 2017-12-16 DIAGNOSIS — G894 Chronic pain syndrome: Secondary | ICD-10-CM

## 2017-12-16 DIAGNOSIS — E669 Obesity, unspecified: Secondary | ICD-10-CM | POA: Insufficient documentation

## 2017-12-16 DIAGNOSIS — K219 Gastro-esophageal reflux disease without esophagitis: Secondary | ICD-10-CM | POA: Insufficient documentation

## 2017-12-16 DIAGNOSIS — I2510 Atherosclerotic heart disease of native coronary artery without angina pectoris: Secondary | ICD-10-CM | POA: Insufficient documentation

## 2017-12-16 DIAGNOSIS — G5711 Meralgia paresthetica, right lower limb: Secondary | ICD-10-CM

## 2017-12-16 DIAGNOSIS — G5713 Meralgia paresthetica, bilateral lower limbs: Secondary | ICD-10-CM | POA: Diagnosis not present

## 2017-12-16 DIAGNOSIS — Z823 Family history of stroke: Secondary | ICD-10-CM | POA: Insufficient documentation

## 2017-12-16 DIAGNOSIS — I255 Ischemic cardiomyopathy: Secondary | ICD-10-CM | POA: Insufficient documentation

## 2017-12-16 DIAGNOSIS — M1990 Unspecified osteoarthritis, unspecified site: Secondary | ICD-10-CM | POA: Diagnosis not present

## 2017-12-16 DIAGNOSIS — Z87891 Personal history of nicotine dependence: Secondary | ICD-10-CM | POA: Insufficient documentation

## 2017-12-16 DIAGNOSIS — Z87442 Personal history of urinary calculi: Secondary | ICD-10-CM | POA: Insufficient documentation

## 2017-12-16 DIAGNOSIS — E7800 Pure hypercholesterolemia, unspecified: Secondary | ICD-10-CM | POA: Diagnosis not present

## 2017-12-16 DIAGNOSIS — Z8249 Family history of ischemic heart disease and other diseases of the circulatory system: Secondary | ICD-10-CM | POA: Insufficient documentation

## 2017-12-16 DIAGNOSIS — I10 Essential (primary) hypertension: Secondary | ICD-10-CM | POA: Diagnosis not present

## 2017-12-16 DIAGNOSIS — I252 Old myocardial infarction: Secondary | ICD-10-CM | POA: Diagnosis not present

## 2017-12-16 DIAGNOSIS — E1142 Type 2 diabetes mellitus with diabetic polyneuropathy: Secondary | ICD-10-CM | POA: Diagnosis not present

## 2017-12-16 DIAGNOSIS — F419 Anxiety disorder, unspecified: Secondary | ICD-10-CM | POA: Insufficient documentation

## 2017-12-16 DIAGNOSIS — M75101 Unspecified rotator cuff tear or rupture of right shoulder, not specified as traumatic: Secondary | ICD-10-CM | POA: Diagnosis not present

## 2017-12-16 DIAGNOSIS — Z951 Presence of aortocoronary bypass graft: Secondary | ICD-10-CM | POA: Insufficient documentation

## 2017-12-16 DIAGNOSIS — G5712 Meralgia paresthetica, left lower limb: Secondary | ICD-10-CM | POA: Insufficient documentation

## 2017-12-16 DIAGNOSIS — S134XXD Sprain of ligaments of cervical spine, subsequent encounter: Secondary | ICD-10-CM | POA: Diagnosis not present

## 2017-12-16 MED ORDER — GABAPENTIN 600 MG PO TABS: 600.0000 mg | ORAL_TABLET | Freq: Four times a day (QID) | ORAL | 0 refills | Status: DC
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
## 2017-12-16 NOTE — Patient Instructions (Signed)
PLEASE FEEL FREE TO CALL OUR OFFICE WITH ANY PROBLEMS OR QUESTIONS ([phone])      

## 2017-12-17 ENCOUNTER — Ambulatory Visit: Payer: BLUE CROSS/BLUE SHIELD | Admitting: Family Medicine

## 2017-12-17 ENCOUNTER — Encounter: Payer: Self-pay | Admitting: Family Medicine

## 2017-12-17 VITALS — BP 126/84 | HR 75 | Temp 97.6°F | Ht 69.0 in | Wt 238.0 lb

## 2017-12-17 DIAGNOSIS — I10 Essential (primary) hypertension: Secondary | ICD-10-CM | POA: Diagnosis not present

## 2017-12-17 DIAGNOSIS — K521 Toxic gastroenteritis and colitis: Secondary | ICD-10-CM

## 2017-12-17 DIAGNOSIS — G629 Polyneuropathy, unspecified: Secondary | ICD-10-CM | POA: Diagnosis not present

## 2017-12-17 DIAGNOSIS — E7800 Pure hypercholesterolemia, unspecified: Secondary | ICD-10-CM | POA: Diagnosis not present

## 2017-12-17 DIAGNOSIS — E1142 Type 2 diabetes mellitus with diabetic polyneuropathy: Secondary | ICD-10-CM | POA: Diagnosis not present

## 2017-12-17 DIAGNOSIS — E1165 Type 2 diabetes mellitus with hyperglycemia: Secondary | ICD-10-CM

## 2017-12-17 DIAGNOSIS — IMO0002 Reserved for concepts with insufficient information to code with codable children: Secondary | ICD-10-CM

## 2017-12-17 MED ORDER — DIPHENOXYLATE-ATROPINE 2.5-0.025 MG PO TABS: 2.0000 | ORAL_TABLET | Freq: Four times a day (QID) | ORAL | 0 refills | Status: DC | PRN

## 2017-12-17 NOTE — Progress Notes (Signed)
Subjective:  Patient ID: Juan Ray, male    DOB: 25-May-1960  Age: 57 y.o. MRN: [id number]  CC: Diabetes (1 month follow up) and Diarrhea (Patient states it has been going on for 7 weeks and is no better)   HPI Juan Ray presents forFollow-up of diabetes. Patient checks blood sugar at home.   168 fasting and 228 postprandial Patient denies symptoms such as polyuria, polydipsia, excessive hunger, nausea. However He continues to have copious diarhea. 3 watery BMs by 10:00 AM today. Can be 8 or more daily. It was intermittent with the Trulicity.  It became nearly constant with the Ozempic.  It has continued unabated with the Januvia.  Through all of this he is been taking metformin.  However he has been taking metformin for several years without diarrhea occurring.  No recent antibiotic use and he has not had rectal pain bleeding or mucus. No significant hypoglycemic spells noted.   History Juan Ray has a past medical history of Anxiety, Arthritis, CAD (coronary artery disease) (04/07/2012), Diabetes mellitus, Diabetic neuropathy ([place]), GERD (gastroesophageal reflux disease), High cholesterol, History of kidney stones, Hypertension, Ischemic cardiomyopathy, Myocardial infarction ([place]) (2013), Neuropathy, Obesity, Pancreatitis, and Peptic ulcer disease.   He has a past surgical history that includes Esophagogastroduodenoscopy (06/18/2011); Coronary angioplasty with stent (04/07/2012); left heart catheterization with coronary angiogram (N/A, 04/07/2012); percutaneous coronary stent intervention (pci-s) (04/07/2012); LEFT HEART CATH AND CORONARY ANGIOGRAPHY (N/A, 04/13/2016); INTRAVASCULAR PRESSURE WIRE/FFR STUDY (N/A, 04/13/2016); Coronary artery bypass graft (N/A, 04/23/2016); TEE without cardioversion (N/A, 04/23/2016); Endoharvest vein of greater saphenous vein (Right, 04/23/2016); Esophagogastroduodenoscopy (2013); Colonoscopy with propofol (N/A, 09/24/2017); Esophagogastroduodenoscopy (egd) with propofol  (N/A, 09/24/2017); polypectomy (09/24/2017); and biopsy (09/24/2017).   His family history includes CAD in his maternal uncle; CVA in his sister; Heart attack in his father.He reports that he quit smoking about 19 years ago. His smoking use included cigarettes. He has a 20.00 pack-year smoking history. He quit smokeless tobacco use about 19 years ago. He reports that he does not drink alcohol or use drugs.  Current Outpatient Medications on File Prior to Visit  Medication Sig Dispense Refill  . aspirin EC 81 MG EC tablet Take 1 tablet (81 mg total) by mouth daily.    Marland Kitchen atorvastatin (LIPITOR) 40 MG tablet TAKE 1 TABLET BY MOUTH ONCE DAILY (Patient taking differently: Take 40 mg by mouth daily. ) 90 tablet 2  . Continuous Blood Gluc Receiver (FREESTYLE LIBRE 14 DAY READER) DEVI 1 Dose by Does not apply route 2 (two) times daily. 1 Device 0  . Continuous Blood Gluc Receiver (FREESTYLE LIBRE 14 DAY READER) DEVI FreeStyle Libre 14 Day Reader  USE AS DIRECTED TWICE DAILY    . Continuous Blood Gluc Sensor (FREESTYLE LIBRE SENSOR SYSTEM) MISC 1 Units by Does not apply route 2 (two) times daily. 2 each 11  . diclofenac sodium (VOLTAREN) 1 % GEL diclofenac 1 % topical gel    . escitalopram (LEXAPRO) 10 MG tablet TAKE 1 TABLET BY MOUTH ONCE DAILY (Patient taking differently: Take 10 mg by mouth at bedtime. ) 90 tablet 3  . furosemide (LASIX) 20 MG tablet Take 20 mg by mouth daily as needed for fluid.    Marland Kitchen gabapentin (NEURONTIN) 600 MG tablet Take 1 tablet (600 mg total) by mouth 4 (four) times daily. 90 tablet 0  . JARDIANCE 25 MG TABS tablet TAKE 1 TABLET BY MOUTH ONCE DAILY (Patient taking differently: Take 25 mg by mouth daily. ) 90 tablet 1  . liraglutide (VICTOZA)  18 MG/3ML SOPN Victoza 3-Pak 0.6 mg/0.1 mL (18 mg/3 mL) subcutaneous pen injector    . lisinopril (PRINIVIL,ZESTRIL) 2.5 MG tablet TAKE 1 TABLET BY MOUTH ONCE DAILY (Patient taking differently: Take 2.5 mg by mouth daily. ) 90 tablet 2  .  metFORMIN (GLUCOPHAGE) 1000 MG tablet TAKE 1 TABLET BY MOUTH TWICE DAILY 180 tablet 1  . metoprolol tartrate (LOPRESSOR) 25 MG tablet TAKE 1 TABLET BY MOUTH TWICE DAILY (Patient taking differently: Take 25 mg by mouth 2 (two) times daily. ) 180 tablet 2  . nitroGLYCERIN (NITROSTAT) 0.4 MG SL tablet Place 1 tablet (0.4 mg total) under the tongue every 5 (five) minutes as needed for chest pain. X 3 doses 25 tablet prn  . oxyCODONE (OXY IR/ROXICODONE) 5 MG immediate release tablet Take 1-2 tablets (5-10 mg total) by mouth every 3 (three) hours as needed for severe pain. Chronic Pain. Dx: G89.4, G62.9 60 tablet 0  . pantoprazole (PROTONIX) 40 MG tablet TAKE 1 TABLET BY MOUTH ONCE DAILY 30 tablet 11  . polyethylene glycol-electrolytes (GAVILYTE-N WITH FLAVOR PACK) 420 g solution GaviLyte-N 420 gram oral solution    . sitaGLIPtin (JANUVIA) 100 MG tablet Take 1 tablet (100 mg total) by mouth daily. 30 tablet 2  . tadalafil (CIALIS) 20 MG tablet Take 0.5-1 tablets (10-20 mg total) by mouth every other day as needed for erectile dysfunction. 100 tablet 0   No current facility-administered medications on file prior to visit.     ROS Review of Systems  Constitutional: Negative for fever.  Respiratory: Negative for shortness of breath.   Cardiovascular: Negative for chest pain.  Gastrointestinal: Positive for abdominal distention and diarrhea. Negative for abdominal pain, constipation and nausea.  Musculoskeletal: Negative for arthralgias.  Skin: Negative for rash.    Objective:  BP 126/84   Pulse 75   Temp 97.6 F (36.4 C) (Oral)   Ht 5' 9"  (1.753 m)   Wt 238 lb (108 kg)   BMI 35.15 kg/m   BP Readings from Last 3 Encounters:  12/17/17 126/84  12/16/17 114/75  11/26/17 95/64    Wt Readings from Last 3 Encounters:  12/17/17 238 lb (108 kg)  12/16/17 239 lb (108.4 kg)  11/26/17 240 lb 9.6 oz (109.1 kg)     Physical Exam Vitals signs reviewed.  Constitutional:      Appearance: He is  well-developed.  HENT:     Head: Normocephalic and atraumatic.     Right Ear: Tympanic membrane and external ear normal. No decreased hearing noted.     Left Ear: Tympanic membrane and external ear normal. No decreased hearing noted.     Mouth/Throat:     Pharynx: No oropharyngeal exudate or posterior oropharyngeal erythema.  Eyes:     Pupils: Pupils are equal, round, and reactive to light.  Neck:     Musculoskeletal: Normal range of motion and neck supple.  Cardiovascular:     Rate and Rhythm: Normal rate and regular rhythm.     Heart sounds: No murmur.  Pulmonary:     Effort: No respiratory distress.     Breath sounds: Normal breath sounds.  Abdominal:     General: Bowel sounds are normal.     Palpations: Abdomen is soft. There is no mass.     Tenderness: There is no abdominal tenderness.       Assessment & Plan:   Juan Ray was seen today for diabetes and diarrhea.  Diagnoses and all orders for this visit:  Uncontrolled type 2  diabetes mellitus with diabetic polyneuropathy, without long-term current use of insulin (HCC)  Diarrhea due to drug  Essential hypertension -     CBC with Differential/Platelet -     CMP14+EGFR -     Lipase  Peripheral polyneuropathy  Other orders -     diphenoxylate-atropine (LOMOTIL) 2.5-0.025 MG tablet; Take 2 tablets by mouth 4 (four) times daily as needed for diarrhea or loose stools.   Patient will hold the [place] for about a week.  He can take Lomotil as needed.  By the end of the week if he is no longer needing the Lomotil then we can be sure the Januvia has been the cause.  However, should the diarrhea continue and he needs the Lomotil regularly toward the end of the week and then a likely the Januvia is not the cause and we need to look at metformin.  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  I  I am having Juan Ray start on diphenoxylate-atropine. I am also having him maintain his aspirin, nitroGLYCERIN, tadalafil, metoprolol tartrate, pantoprazole, JARDIANCE, atorvastatin, lisinopril, escitalopram, furosemide, metFORMIN, oxyCODONE, FREESTYLE LIBRE 14 DAY READER, FREESTYLE LIBRE SENSOR SYSTEM, FREESTYLE LIBRE 14 DAY READER, liraglutide, polyethylene glycol-electrolytes, diclofenac sodium, sitaGLIPtin, and gabapentin.  Meds ordered this encounter  Medications  . diphenoxylate-atropine (LOMOTIL) 2.5-0.025 MG tablet    Sig: Take 2 tablets by mouth 4 (four) times daily as needed for diarrhea or loose stools.    Dispense:  30 tablet    Refill:  0     Follow-up: Return in about 1 week (around 12/24/2017).  Claretta Fraise, M.D.

## 2017-12-17 NOTE — Addendum Note (Signed)
Addended by: Alger Simons T on: 12/17/2017 06:21 PM   Modules accepted: Orders

## 2017-12-18 LAB — CMP14+EGFR
ALK PHOS: 83 IU/L (ref 39–117)
ALT: 23 IU/L (ref 0–44)
AST: 21 IU/L (ref 0–40)
Albumin/Globulin Ratio: 1.8 (ref 1.2–2.2)
Albumin: 4.4 g/dL (ref 3.5–5.5)
BUN/Creatinine Ratio: 22 — ABNORMAL HIGH (ref 9–20)
BUN: 19 mg/dL (ref 6–24)
Bilirubin Total: 0.5 mg/dL (ref 0.0–1.2)
CO2: 22 mmol/L (ref 20–29)
Calcium: 9.8 mg/dL (ref 8.7–10.2)
Chloride: 103 mmol/L (ref 96–106)
Creatinine, Ser: 0.88 mg/dL (ref 0.76–1.27)
GFR calc Af Amer: 109 mL/min/{1.73_m2} (ref >59)
GFR calc non Af Amer: 94 mL/min/{1.73_m2} (ref >59)
Globulin, Total: 2.4 g/dL (ref 1.5–4.5)
Glucose: 170 mg/dL — ABNORMAL HIGH (ref 65–99)
Potassium: 4.4 mmol/L (ref 3.5–5.2)
Sodium: 142 mmol/L (ref 134–144)
Total Protein: 6.8 g/dL (ref 6.0–8.5)

## 2017-12-18 LAB — CBC WITH DIFFERENTIAL/PLATELET
BASOS ABS: 0.1 10*3/uL (ref 0.0–0.2)
Basos: 1 %
EOS (ABSOLUTE): 0.3 10*3/uL (ref 0.0–0.4)
Eos: 3 %
Hematocrit: 44.7 % (ref 37.5–51.0)
Hemoglobin: 15.7 g/dL (ref 13.0–17.7)
Immature Grans (Abs): 0 10*3/uL (ref 0.0–0.1)
Immature Granulocytes: 0 %
LYMPHS ABS: 2.3 10*3/uL (ref 0.7–3.1)
Lymphs: 24 %
MCH: 32.6 pg (ref 26.6–33.0)
MCHC: 35.1 g/dL (ref 31.5–35.7)
MCV: 93 fL (ref 79–97)
Monocytes Absolute: 1.4 10*3/uL — ABNORMAL HIGH (ref 0.1–0.9)
Monocytes: 14 %
NEUTROS PCT: 58 %
Neutrophils Absolute: 5.5 10*3/uL (ref 1.4–7.0)
PLATELETS: 276 10*3/uL (ref 150–450)
RBC: 4.82 x10E6/uL (ref 4.14–5.80)
RDW: 13.1 % (ref 12.3–15.4)
WBC: 9.6 10*3/uL (ref 3.4–10.8)

## 2017-12-18 LAB — LIPASE: LIPASE: 49 U/L (ref 13–78)

## 2017-12-18 NOTE — Progress Notes (Signed)
Hello Juan Ray,  Your lab result is normal.Some minor variations that are not significant are commonly marked abnormal, but do not represent any medical problem for you.  Best regards, Kindal Ponti, M.D.

## 2017-12-20 LAB — TOXASSURE SELECT,+ANTIDEPR,UR

## 2017-12-23 ENCOUNTER — Telehealth: Payer: Self-pay | Admitting: *Deleted

## 2017-12-23 NOTE — Telephone Encounter (Signed)
Urine drug screen for 12/16/17 was positive for antidepressant only. He reported last dose of oxycodone was 12/15/17. His last fill was in October (10th) #60 so he could not be taking regularly and would be expected.

## 2017-12-26 ENCOUNTER — Encounter (HOSPITAL_COMMUNITY): Payer: Self-pay

## 2017-12-26 ENCOUNTER — Ambulatory Visit (HOSPITAL_COMMUNITY): Payer: BLUE CROSS/BLUE SHIELD | Attending: Physical Medicine & Rehabilitation

## 2017-12-26 ENCOUNTER — Telehealth (HOSPITAL_COMMUNITY): Payer: Self-pay | Admitting: Specialist

## 2017-12-26 ENCOUNTER — Other Ambulatory Visit: Payer: Self-pay | Admitting: Family Medicine

## 2017-12-26 ENCOUNTER — Telehealth: Payer: Self-pay | Admitting: Family Medicine

## 2017-12-26 ENCOUNTER — Other Ambulatory Visit: Payer: Self-pay

## 2017-12-26 ENCOUNTER — Ambulatory Visit: Payer: BLUE CROSS/BLUE SHIELD | Admitting: Family Medicine

## 2017-12-26 ENCOUNTER — Ambulatory Visit (HOSPITAL_COMMUNITY)
Admission: RE | Admit: 2017-12-26 | Discharge: 2017-12-26 | Disposition: A | Discharge status: Home / Self Care | Payer: BLUE CROSS/BLUE SHIELD | Source: Ambulatory Visit | Attending: Physical Medicine & Rehabilitation | Admitting: Physical Medicine & Rehabilitation

## 2017-12-26 DIAGNOSIS — G8929 Other chronic pain: Secondary | ICD-10-CM | POA: Diagnosis not present

## 2017-12-26 DIAGNOSIS — M25611 Stiffness of right shoulder, not elsewhere classified: Secondary | ICD-10-CM

## 2017-12-26 DIAGNOSIS — M75101 Unspecified rotator cuff tear or rupture of right shoulder, not specified as traumatic: Secondary | ICD-10-CM | POA: Insufficient documentation

## 2017-12-26 DIAGNOSIS — M25512 Pain in left shoulder: Secondary | ICD-10-CM | POA: Diagnosis not present

## 2017-12-26 DIAGNOSIS — M25511 Pain in right shoulder: Secondary | ICD-10-CM | POA: Diagnosis not present

## 2017-12-26 DIAGNOSIS — R29898 Other symptoms and signs involving the musculoskeletal system: Secondary | ICD-10-CM | POA: Diagnosis not present

## 2017-12-26 MED ORDER — OXYCODONE HCL 5 MG PO TABS: 5.0000 mg | ORAL_TABLET | Freq: Two times a day (BID) | ORAL | 0 refills | Status: DC | PRN

## 2017-12-26 MED ORDER — PIOGLITAZONE HCL 30 MG PO TABS: 30.0000 mg | ORAL_TABLET | Freq: Every day | ORAL | 2 refills | Status: DC

## 2017-12-26 NOTE — Telephone Encounter (Signed)
Sent pt email to cx this apptment since voicemail is full and cound not leave a message. NF 12/26/17 Called to cancel this apptment per LE. Mailbox is full and I could not leave a message. NF 12/26/17 covered at 100% until Jan 01, 2018. ON WAIT LIST FOR DEC 30-or 31/ 19.NF 12/26/17

## 2017-12-26 NOTE — Patient Instructions (Signed)
1) Use heat on right shoulder for 10-20 minutes for tightness. Can use heat multiple times a day.   2) Use tennis ball in front of shoulder against door frame. Once tender area is felt keep pressure on for approximately 1-2 minutes. Spend about 5 minutes on shoulder using ball.   Complete the following exercises 2-3 times a day.  Doorway Stretch  Place each hand opposite each other on the doorway. (You can change where you feel the stretch by moving arms higher or lower.) Step through with one foot and bend front knee until a stretch is felt and hold. Step through with the opposite foot on the next rep. Hold for __15-30___ seconds. Repeat __2__times.     Scapular Retraction (Standing)    Shoulder Abduction Stretch  Stand side ways by a wall with affected up on wall. Gently step in toward wall to feel stretch. Hold for 15-30 seconds. Complete 2 times.    Repeat all exercises 10-15 times, 1 times per day.   5) Shoulder Abduction  Standing:       begin with your arms flat on the table next to your side. Slowly move your arms out to the side so that they go overhead, in a jumping jack or snow angel movement.

## 2017-12-26 NOTE — Telephone Encounter (Signed)
Patient aware of recommendations.  

## 2017-12-26 NOTE — Telephone Encounter (Signed)
There was some confusion on appointment time today and he missed his appointment this morning. Patient states since stopping the Januvia the diarrhea has stopped. Patient wants to see what you suggest. We have no open appointments with you today or tomorrow.

## 2017-12-26 NOTE — Telephone Encounter (Signed)
Oxycodone rx written

## 2017-12-26 NOTE — Telephone Encounter (Signed)
Please contact the patient. I sent in Actos. Take one daily. This one takes 6 weeks to fully get into his system. Don't look for immediate drop in glucose.

## 2017-12-26 NOTE — Telephone Encounter (Signed)
NA - no VM, 12/26-jhb

## 2017-12-26 NOTE — Therapy (Signed)
[place], [place], [address] Phone: [phone]   Fax:  [phone]  Occupational Therapy Evaluation  Patient Details  Name: Juan Ray MRN: [id number] Date of Birth: January 16, 1960 Referring Provider (OT): Alger Simons MD   Encounter Date: 12/26/2017  OT End of Session - 12/26/17 1540    Visit Number  1    Number of Visits  3    Date for OT Re-Evaluation  01/17/18    Authorization Type  [employer]     Authorization Time Period  Covered 100% until the end of the calender year. 30 visit limit. zero used.     Authorization - Visit Number  1    Authorization - Number of Visits  30    OT Start Time  6962    OT Stop Time  1117    OT Time Calculation (min)  45 min    Activity Tolerance  Patient tolerated treatment well    Behavior During Therapy  WFL for tasks assessed/performed       Past Medical History:  Diagnosis Date  . Anxiety   . Arthritis   . CAD (coronary artery disease) 04/07/2012   a. Inferoposterior STEMI s/p DES-mid LCx. b. s/p CABG 04/2016.  . Diabetes mellitus   . Diabetic neuropathy ([place])   . GERD (gastroesophageal reflux disease)   . High cholesterol   . History of kidney stones   . Hypertension   . Ischemic cardiomyopathy    a. EF not totally clear - in 04/2016 was 35-40% by cath, 55-60% by echo, 40-45% by TEE all around same time.  . Myocardial infarction ([place]) 2013  . Neuropathy   . Obesity   . Pancreatitis    a. mild by CT 06/2011  . Peptic ulcer disease    a. 06/2009 EGD: multiple gastric and duodenal ulcers.    Past Surgical History:  Procedure Laterality Date  . BIOPSY  09/24/2017   Procedure: BIOPSY;  Surgeon: Danie Binder, MD;  [facility];  Service: Endoscopy;;  gastric bx's  . COLONOSCOPY WITH PROPOFOL N/A 09/24/2017   Procedure: COLONOSCOPY WITH PROPOFOL;  Surgeon: Danie Binder, MD;  [facility];  Service: Endoscopy;  Laterality: N/A;  7:30am  .  CORONARY ANGIOPLASTY WITH STENT PLACEMENT  04/07/2012   70% prox LAD, 70-80% ostial diagonal, 70% mid-distal LAD, 80% prox OM1, mid LCx totally occluded just distal to OM1 s/p DES, occluded RCA; severe inferior HK, LVEF 45%  . CORONARY ARTERY BYPASS GRAFT N/A 04/23/2016   Procedure: CORONARY ARTERY BYPASS GRAFTING (CABG) x 4;  Surgeon: Gaye Pollack, MD;  Location: [facility];  Service: Open Heart Surgery;  Laterality: N/A;  . ENDOVEIN HARVEST OF GREATER SAPHENOUS VEIN Right 04/23/2016   Procedure: ENDOVEIN HARVEST OF GREATER SAPHENOUS VEIN;  Surgeon: Gaye Pollack, MD;  Location: [facility];  Service: Open Heart Surgery;  Laterality: Right;  . ESOPHAGOGASTRODUODENOSCOPY  06/18/2011   multiple ulcers in proximal stomach with stigmata of bleeding s/p biopsy, multiple superficial ulcers in duodenal bulb. normal ampulla and second portion of duodenum  . ESOPHAGOGASTRODUODENOSCOPY  2013  . ESOPHAGOGASTRODUODENOSCOPY (EGD) WITH PROPOFOL N/A 09/24/2017   Procedure: ESOPHAGOGASTRODUODENOSCOPY (EGD) WITH PROPOFOL;  Surgeon: Danie Binder, MD;  [facility];  Service: Endoscopy;  Laterality: N/A;  . INTRAVASCULAR PRESSURE WIRE/FFR STUDY N/A 04/13/2016   Procedure: Intravascular Pressure Wire/FFR Study;  Surgeon: Nelva Bush, MD;  Location: [facility];  Service: Cardiovascular;  Laterality: N/A;  . LEFT HEART CATH AND CORONARY ANGIOGRAPHY N/A 04/13/2016   Procedure: Left Heart Cath and Coronary Angiography;  Surgeon: Nelva Bush, MD;  Location: [facility];  Service: Cardiovascular;  Laterality: N/A;  . LEFT HEART CATHETERIZATION WITH CORONARY ANGIOGRAM N/A 04/07/2012   Procedure: LEFT HEART CATHETERIZATION WITH CORONARY ANGIOGRAM;  Surgeon: Sherren Mocha, MD;  Location: [facility];  Service: Cardiovascular;  Laterality: N/A;  . PERCUTANEOUS CORONARY STENT INTERVENTION (PCI-S)  04/07/2012   Procedure: PERCUTANEOUS CORONARY STENT INTERVENTION (PCI-S);  Surgeon: Sherren Mocha, MD;  Location:  [facility] C[facility];  Service: Cardiovascular;;  . POLYPECTOMY  09/24/2017   Procedure: POLYPECTOMY;  Surgeon: Danie Binder, MD;  [facility];  Service: Endoscopy;;  hepatic flexure polyp, transverse colon polyps x4  . TEE WITHOUT CARDIOVERSION N/A 04/23/2016   Procedure: TRANSESOPHAGEAL ECHOCARDIOGRAM (TEE);  Surgeon: Gaye Pollack, MD;  Location: [facility];  Service: Open Heart Surgery;  Laterality: N/A;    There were no vitals filed for this visit.  Subjective Assessment - 12/26/17 1523    Subjective   S: I think I originally caused this about 30 years ago when I dislocated this shoulder playing ball.     Pertinent History  Patient is a 57 y/o male S/P right chronic shoulder pain suspected to be caused by tendonosis and AC joint arthritis. Patient report a long ago shoulder dislocation approximately 30 years ago. Since the dislocation he has always had pain. In the past 5 years, he reports the pain has gotten worse. Dr. Naaman Plummer has referred him to occupational therapy for evaluation and treatment.     Special Tests  FOTO score: 74/100    Patient Stated Goals  To decrease the pain and be able to use his right arm with more comfort.     Currently in Pain?  Yes    Pain Score  3    8/10 with abduction/reaching to the side   Pain Location  Shoulder    Pain Orientation  Right    Pain Descriptors / Indicators  Aching;Sharp    Pain Type  Chronic pain    Pain Radiating Towards  N/A    Pain Onset  More than a month ago    Pain Frequency  Occasional    Aggravating Factors   sleeping on the right side. abduction/reaching out to the side    Pain Relieving Factors  rest    Effect of Pain on Daily Activities  patient is limited to sleeping on his left side and he is unable to reach to the side without pain.    Multiple Pain Sites  No        [facility] OT Assessment - 12/26/17 1040      Assessment   Medical Diagnosis  Right shoulder tendonitis/AC joint     Referring Provider (OT)  Alger Simons  MD    Onset Date/Surgical Date  --   approximately 30 years ago   Hand Dominance  Right    Next MD Visit  01/21/18    Prior Therapy  None on right shoulder      Precautions   Precautions  None      Restrictions   Weight Bearing Restrictions  No      Balance Screen   Has the patient fallen in the past 6 months  No      Home  Environment   Family/patient expects to be discharged to:  Private residence  Prior Function   Level of Independence  Independent    Vocation  Full time employment;Self employed    [employer]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 He is not driving right now due to his shoulder.       ADL   ADL comments  Unable up to sleep on the right side comfortably without waking up. Pain of 8/10 when reaching out to the side.        Mobility   Mobility Status  Independent      Written Expression   Dominant Hand  Right      Vision - History   Baseline Vision  No visual deficits      Cognition   Overall Cognitive Status  Within Functional Limits for tasks assessed      Observation/Other Assessments   Observations  Negative: [employer], [employer], Empty Can Test, Chandelliar Test, and Hawkin's Test    Focus on Therapeutic Outcomes (FOTO)   74/100      ROM / Strength   AROM / PROM / Strength  AROM;PROM;Strength      Palpation   Palpation comment  Max fascial restrictions in right anterior shoulder and AC joint region only.       AROM   Overall AROM Comments  Assessed seated. IR/er abducted    AROM Assessment Site  Shoulder    Right/Left Shoulder  Right    Right Shoulder Flexion  160 Degrees   left: 160   Right Shoulder ABduction  105 Degrees   left: 160   Right Shoulder Internal Rotation  75 Degrees   left: 90   Right Shoulder External Rotation  60 Degrees   left: 85     PROM   Overall PROM   Within functional limits for tasks performed    Overall PROM Comments  Shoulder BUE      Strength    Overall Strength Comments  Assessed seated. IR/er abducted    Strength Assessment Site  Shoulder    Right/Left Shoulder  Right    Right Shoulder Flexion  5/5    Right Shoulder ABduction  5/5    Right Shoulder Internal Rotation  5/5    Right Shoulder External Rotation  5/5               OT Treatments/Exercises (OP) - 12/26/17 1553      Exercises   Exercises  Shoulder      Manual Therapy   Manual Therapy  Joint mobilization    Manual therapy comments  Manual therapy completed prior to exercises.    Joint Mobilization  Joint mobilization techniques completed during passive shoulder abduction to increase joint mobility and pain.             OT Education - 12/26/17 1539    Education Details  Use of heat therapy, self myofascial release with ball, doorway stretch, abduction stretch, and A/ROM abduction shoulder exercises for HEP.     Person(s) Educated  Patient    Methods  Explanation;Demonstration;Verbal cues;Handout    Comprehension  Returned demonstration;Verbalized understanding       OT Short Term Goals - 12/26/17 1549      OT SHORT TERM GOAL #1   Title  Patient will be educated and independent with a HEP to work towards increasing his Right shoulder ROM and decrease fascial restrictions and pain level when completing daily and work related tasks.     Time  3  Period  Weeks    Status  New    Target Date  01/17/18      OT SHORT TERM GOAL #2   Title  Patient will achieve full to functional shoulder abduction in order to work towards less pain and discomfort when reaching to the side.     Time  3    Period  Weeks    Status  New      OT SHORT TERM GOAL #3   Title  Patient will achieve moderate amount of fascial restrictions in his RUE by completing self myofascial release techniques in order to increase functional mobility needed to complete daily tasks.     Time  3    Period  Weeks    Status  New               Plan - 12/26/17 1542    Clinical  Impression Statement  A: patient is a 57 y/o male S/P right chronic shoulder pain which has been ongoing for 30 years and gradually becoming worse in the past 5. Patient presents with pain with abduction/ reaching out to the side, and is unable to sleep on his right side. He has increased fascial restrictions and slightly decreased ROM for abduction only. No strength concerns as patient has full 5/5 shoulder strength. Patient reports limitations only with sleeping and reaching out to the side. Pt is scheduled for a X-ray following today's appointment. Based on his evaluation, it appears that his shoulder is showing signs of arthritis and degenerative changes and he will be provided with a HEP and educated on self myofascial release and use of heat for pain management and tightness. No long term or frequent therapy treatment is warranted.     Occupational Profile and client history currently impacting functional performance  Patient is motivated to return to prior level of function.    Occupational performance deficits (Please refer to evaluation for details):  ADL's;Rest and Sleep;Work    Environmental manager    Current Impairments/barriers affecting progress:  Chronic condition with longstanding pain.     OT Frequency  1x / week    OT Duration  Other (comment)   3 weeks. (pt is on waitlist for next week. Extending duration/cert in case he is not able to be seen.)   OT Treatment/Interventions  Self-care/ADL training;Moist Heat;DME and/or AE instruction;Therapeutic activities;Ultrasound;Therapeutic exercise;Passive range of motion;Neuromuscular education;Cryotherapy;Electrical Stimulation;Manual Therapy;Patient/family education    Plan  P: patient will benefit from skilled OT services to increase functional performance and allow him to complete daily and work related tasks with less difficulty and discomfort. Treatment plan: follow up on patient's HEP and progress with self treatment. Myofascial  release to right anterior shoulder and deltoid region. manual stretching for abduction. General ROM exercises.     Clinical Decision Making  Limited treatment options, no task modification necessary    Consulted and Agree with Plan of Care  Patient       Patient will benefit from skilled therapeutic intervention in order to improve the following deficits and impairments:  Decreased range of motion, Pain, Impaired UE functional use, Increased fascial restrictions  Visit Diagnosis: Other symptoms and signs involving the musculoskeletal system  Stiffness of right shoulder, not elsewhere classified  Chronic left shoulder pain    Problem List Patient Active Problem List   Diagnosis Date Noted  . Meralgia paresthetica of right side 12/16/2017  . Meralgia paresthetica of left side 12/16/2017  . Meralgia paresthetica of both  lower extremities 12/16/2017  . Rotator cuff syndrome of right shoulder 12/16/2017  . Diabetic peripheral neuropathy ([place]) 10/31/2017  . Impingement syndrome of right shoulder region 10/31/2017  . Knee pain 10/30/2017  . Encounter for screening colonoscopy 07/03/2017  . Chronic diastolic CHF (congestive heart failure) ([place]) 01/22/2017  . S/P CABG x 4 04/23/2016  . Abnormal stress test 04/13/2016  . Chest pain 03/11/2016  . Ischemic cardiomyopathy 07/02/2013  . Midsternal chest pain 07/11/2012  . ST elevation myocardial infarction (STEMI) of inferoposterior wall ([place]) 04/09/2012  . CAD (coronary artery disease), native coronary artery 04/09/2012  . PUD (peptic ulcer disease) 04/09/2012  . Epigastric pain 06/18/2011  . HTN (hypertension) 06/18/2011  . Type 1 diabetes mellitus ([place]) 06/18/2011  . Hyperlipidemia 06/18/2011   Ailene Ravel, OTR/L,CBIS  [phone]  12/26/2017, 3:55 PM  [facility] [address] [place], [place], [address] Phone: [phone]   Fax:  [phone]  Name: Juan Ray MRN:  [id number] Date of Birth: 1960/12/10

## 2017-12-27 ENCOUNTER — Ambulatory Visit (HOSPITAL_COMMUNITY): Payer: BLUE CROSS/BLUE SHIELD | Admitting: Specialist

## 2017-12-27 ENCOUNTER — Telehealth (HOSPITAL_COMMUNITY): Payer: Self-pay | Admitting: Specialist

## 2017-12-27 NOTE — Telephone Encounter (Signed)
Please let Juan Ray know that xray of shoulder demonstrates arthritis along where his shoulder blade meets his clavicle and in the shoulder socket itself. He may proceed with therapies as directed. thx

## 2017-12-27 NOTE — Telephone Encounter (Signed)
Patient notified, patient verbalized understanding.

## 2017-12-27 NOTE — Telephone Encounter (Signed)
Pt called back to cx this apptment

## 2017-12-31 ENCOUNTER — Encounter

## 2017-12-31 ENCOUNTER — Other Ambulatory Visit: Payer: Self-pay | Admitting: Family Medicine

## 2018-01-08 ENCOUNTER — Ambulatory Visit (HOSPITAL_COMMUNITY): Payer: BLUE CROSS/BLUE SHIELD | Attending: Physical Medicine & Rehabilitation | Admitting: Occupational Therapy

## 2018-01-14 ENCOUNTER — Telehealth (HOSPITAL_COMMUNITY): Payer: Self-pay | Admitting: Family Medicine

## 2018-01-14 NOTE — Telephone Encounter (Signed)
01/14/18  pt called to cx and doesn't want to reschedule

## 2018-01-15 ENCOUNTER — Ambulatory Visit (HOSPITAL_COMMUNITY): Payer: BLUE CROSS/BLUE SHIELD

## 2018-01-21 ENCOUNTER — Encounter: Payer: Self-pay | Admitting: Physical Medicine & Rehabilitation

## 2018-01-21 ENCOUNTER — Encounter
Payer: BLUE CROSS/BLUE SHIELD | Attending: Physical Medicine & Rehabilitation | Admitting: Physical Medicine & Rehabilitation

## 2018-01-21 VITALS — BP 124/80 | HR 67 | Resp 14 | Ht 69.0 in | Wt 243.0 lb

## 2018-01-21 DIAGNOSIS — E669 Obesity, unspecified: Secondary | ICD-10-CM | POA: Insufficient documentation

## 2018-01-21 DIAGNOSIS — G5711 Meralgia paresthetica, right lower limb: Secondary | ICD-10-CM

## 2018-01-21 DIAGNOSIS — Z8249 Family history of ischemic heart disease and other diseases of the circulatory system: Secondary | ICD-10-CM | POA: Insufficient documentation

## 2018-01-21 DIAGNOSIS — Z8711 Personal history of peptic ulcer disease: Secondary | ICD-10-CM | POA: Insufficient documentation

## 2018-01-21 DIAGNOSIS — G5713 Meralgia paresthetica, bilateral lower limbs: Secondary | ICD-10-CM | POA: Insufficient documentation

## 2018-01-21 DIAGNOSIS — Z6835 Body mass index (BMI) 35.0-35.9, adult: Secondary | ICD-10-CM | POA: Diagnosis not present

## 2018-01-21 DIAGNOSIS — E7800 Pure hypercholesterolemia, unspecified: Secondary | ICD-10-CM | POA: Diagnosis not present

## 2018-01-21 DIAGNOSIS — F419 Anxiety disorder, unspecified: Secondary | ICD-10-CM | POA: Diagnosis not present

## 2018-01-21 DIAGNOSIS — G894 Chronic pain syndrome: Secondary | ICD-10-CM | POA: Insufficient documentation

## 2018-01-21 DIAGNOSIS — Z87442 Personal history of urinary calculi: Secondary | ICD-10-CM | POA: Insufficient documentation

## 2018-01-21 DIAGNOSIS — I252 Old myocardial infarction: Secondary | ICD-10-CM | POA: Insufficient documentation

## 2018-01-21 DIAGNOSIS — E1142 Type 2 diabetes mellitus with diabetic polyneuropathy: Secondary | ICD-10-CM | POA: Diagnosis not present

## 2018-01-21 DIAGNOSIS — Z823 Family history of stroke: Secondary | ICD-10-CM | POA: Diagnosis not present

## 2018-01-21 DIAGNOSIS — M25511 Pain in right shoulder: Secondary | ICD-10-CM | POA: Insufficient documentation

## 2018-01-21 DIAGNOSIS — M7521 Bicipital tendinitis, right shoulder: Secondary | ICD-10-CM | POA: Diagnosis not present

## 2018-01-21 DIAGNOSIS — I255 Ischemic cardiomyopathy: Secondary | ICD-10-CM | POA: Diagnosis not present

## 2018-01-21 DIAGNOSIS — I2510 Atherosclerotic heart disease of native coronary artery without angina pectoris: Secondary | ICD-10-CM | POA: Diagnosis not present

## 2018-01-21 DIAGNOSIS — Z951 Presence of aortocoronary bypass graft: Secondary | ICD-10-CM | POA: Diagnosis not present

## 2018-01-21 DIAGNOSIS — M75101 Unspecified rotator cuff tear or rupture of right shoulder, not specified as traumatic: Secondary | ICD-10-CM

## 2018-01-21 DIAGNOSIS — M1990 Unspecified osteoarthritis, unspecified site: Secondary | ICD-10-CM | POA: Insufficient documentation

## 2018-01-21 DIAGNOSIS — S134XXD Sprain of ligaments of cervical spine, subsequent encounter: Secondary | ICD-10-CM | POA: Diagnosis not present

## 2018-01-21 DIAGNOSIS — I10 Essential (primary) hypertension: Secondary | ICD-10-CM | POA: Diagnosis not present

## 2018-01-21 DIAGNOSIS — Z87891 Personal history of nicotine dependence: Secondary | ICD-10-CM | POA: Insufficient documentation

## 2018-01-21 DIAGNOSIS — K219 Gastro-esophageal reflux disease without esophagitis: Secondary | ICD-10-CM | POA: Insufficient documentation

## 2018-01-21 MED ORDER — OXYCODONE HCL 5 MG PO TABS: 5.0000 mg | ORAL_TABLET | Freq: Two times a day (BID) | ORAL | 0 refills | Status: DC | PRN

## 2018-01-21 MED ORDER — CYCLOBENZAPRINE HCL 10 MG PO TABS: 10.0000 mg | ORAL_TABLET | Freq: Two times a day (BID) | ORAL | 3 refills | Status: DC | PRN

## 2018-01-21 NOTE — Progress Notes (Signed)
Subjective:    Patient ID: Juan Ray, male    DOB: [birth year], 58 y.o.   MRN: [id number]  HPI   Juan Ray is here in follow-up of his chronic pain syndrome, specifically low back and hip pain.  I last saw him on December 16.  At that time I felt that a lot of his pain was related to right shoulder and associated degenerative disease as well as meralgia paresthetica bilaterally.  I ordered x-rays of his right shoulder.  X-rays revealed mild degenerative changes at the right [facility] joint as well as glenohumeral joint with joint space narrowing and associated spurring. He went for therapy x 1 visit.  He tells me that the therapist only planned on a second visit as he had a "chronic problem and there was not much that could be done".  He was given some exercises which she has continued with at home which have helped somewhat with his range of motion.  His primary care doctor has been adjusting medications for his diabetes.  Now he is on Actos 30 mg daily.  He tells me he has begun to have cramps in both legs especially at nighttime.  He wonders if it is related to the medication.  He states that the increase in his gabapentin has helped with the pain in both of his thighs.  He is trying to wear more loose fitting clothing.  He is aware that he needs to lose some weight as well.   Pain Inventory Average Pain 5 Pain Right Now 5 My pain is constant  In the last 24 hours, has pain interfered with the following? General activity 4 Relation with others 10 Enjoyment of life 3 What TIME of day is your pain at its worst? night Sleep (in general) Poor  Pain is worse with: inactivity Pain improves with: medication Relief from Meds: n/a  Mobility walk without assistance ability to climb steps?  yes do you drive?  yes transfers alone  Function employed # of hrs/week .  Neuro/Psych numbness tingling spasms  Prior Studies Any changes since last visit?  no  Physicians involved in  your care Any changes since last visit?  no   Family History  Problem Relation Age of Onset  . Heart attack Father        MI x 2 in late 50's, currently 64's  . CVA Sister        late 50s  . CAD Maternal Uncle        MI at 26  . Colon cancer Neg Hx   . Colon polyps Neg Hx    Social History   Socioeconomic History  . Marital status: Married    Spouse name: Not on file  . Number of children: 2  . Years of education: Not on file  . Highest education level: Not on file  Occupational History  . Occupation: truck Animator Needs  . Financial resource strain: Not on file  . Food insecurity:    Worry: Not on file    Inability: Not on file  . Transportation needs:    Medical: Not on file    Non-medical: Not on file  Tobacco Use  . Smoking status: Former Smoker    Packs/day: 1.00    Years: 20.00    Pack years: 20.00    Types: Cigarettes    Last attempt to quit: 2000    Years since quitting: 20.0  . Smokeless tobacco: Former Leisure centre manager  date: 01/01/1998  Substance and Sexual Activity  . Alcohol use: No  . Drug use: No  . Sexual activity: Yes  Lifestyle  . Physical activity:    Days per week: Not on file    Minutes per session: Not on file  . Stress: Not on file  Relationships  . Social connections:    Talks on phone: Not on file    Gets together: Not on file    Attends religious service: Not on file    Active member of club or organization: Not on file    Attends meetings of clubs or organizations: Not on file    Relationship status: Not on file  Other Topics Concern  . Not on file  Social History Narrative   Lives in [place], with his wife and 2 children   Past Surgical History:  Procedure Laterality Date  . BIOPSY  09/24/2017   Procedure: BIOPSY;  Surgeon: Danie Binder, MD;  [facility];  Service: Endoscopy;;  gastric bx's  . COLONOSCOPY WITH PROPOFOL N/A 09/24/2017   Procedure: COLONOSCOPY WITH PROPOFOL;  Surgeon: Danie Binder, MD;   [facility];  Service: Endoscopy;  Laterality: N/A;  7:30am  . CORONARY ANGIOPLASTY WITH STENT PLACEMENT  04/07/2012   70% prox LAD, 70-80% ostial diagonal, 70% mid-distal LAD, 80% prox OM1, mid LCx totally occluded just distal to OM1 s/p DES, occluded RCA; severe inferior HK, LVEF 45%  . CORONARY ARTERY BYPASS GRAFT N/A 04/23/2016   Procedure: CORONARY ARTERY BYPASS GRAFTING (CABG) x 4;  Surgeon: Gaye Pollack, MD;  Location: [place] OR;  Service: Open Heart Surgery;  Laterality: N/A;  . ENDOVEIN HARVEST OF GREATER SAPHENOUS VEIN Right 04/23/2016   Procedure: ENDOVEIN HARVEST OF GREATER SAPHENOUS VEIN;  Surgeon: Gaye Pollack, MD;  Location: [place];  Service: Open Heart Surgery;  Laterality: Right;  . ESOPHAGOGASTRODUODENOSCOPY  06/18/2011   multiple ulcers in proximal stomach with stigmata of bleeding s/p biopsy, multiple superficial ulcers in duodenal bulb. normal ampulla and second portion of duodenum  . ESOPHAGOGASTRODUODENOSCOPY  2013  . ESOPHAGOGASTRODUODENOSCOPY (EGD) WITH PROPOFOL N/A 09/24/2017   Procedure: ESOPHAGOGASTRODUODENOSCOPY (EGD) WITH PROPOFOL;  Surgeon: Danie Binder, MD;  [facility];  Service: Endoscopy;  Laterality: N/A;  . INTRAVASCULAR PRESSURE WIRE/FFR STUDY N/A 04/13/2016   Procedure: Intravascular Pressure Wire/FFR Study;  Surgeon: Nelva Bush, MD;  Location: [place] [facility] LAB;  Service: Cardiovascular;  Laterality: N/A;  . LEFT HEART CATH AND CORONARY ANGIOGRAPHY N/A 04/13/2016   Procedure: Left Heart Cath and Coronary Angiography;  Surgeon: Nelva Bush, MD;  Location: [place] [facility] LAB;  Service: Cardiovascular;  Laterality: N/A;  . LEFT HEART CATHETERIZATION WITH CORONARY ANGIOGRAM N/A 04/07/2012   Procedure: LEFT HEART CATHETERIZATION WITH CORONARY ANGIOGRAM;  Surgeon: Sherren Mocha, MD;  Location: [facility] CATH LAB;  Service: Cardiovascular;  Laterality: N/A;  . PERCUTANEOUS CORONARY STENT INTERVENTION (PCI-S)  04/07/2012   Procedure: PERCUTANEOUS  CORONARY STENT INTERVENTION (PCI-S);  Surgeon: Sherren Mocha, MD;  Location: [facility] CATH LAB;  Service: Cardiovascular;;  . POLYPECTOMY  09/24/2017   Procedure: POLYPECTOMY;  Surgeon: Danie Binder, MD;  [facility];  Service: Endoscopy;;  hepatic flexure polyp, transverse colon polyps x4  . TEE WITHOUT CARDIOVERSION N/A 04/23/2016   Procedure: TRANSESOPHAGEAL ECHOCARDIOGRAM (TEE);  Surgeon: Gaye Pollack, MD;  Location: [place];  Service: Open Heart Surgery;  Laterality: N/A;   Past Medical History:  Diagnosis Date  . Anxiety   . Arthritis   .  CAD (coronary artery disease) 04/07/2012   a. Inferoposterior STEMI s/p DES-mid LCx. b. s/p CABG 04/2016.  . Diabetes mellitus   . Diabetic neuropathy ([place])   . GERD (gastroesophageal reflux disease)   . High cholesterol   . History of kidney stones   . Hypertension   . Ischemic cardiomyopathy    a. EF not totally clear - in 04/2016 was 35-40% by cath, 55-60% by echo, 40-45% by TEE all around same time.  . Myocardial infarction ([place]) 2013  . Neuropathy   . Obesity   . Pancreatitis    a. mild by CT 06/2011  . Peptic ulcer disease    a. 06/2009 EGD: multiple gastric and duodenal ulcers.   BP 124/80   Pulse 67   Resp 14   Ht 5\' 9"  (1.753 m)   Wt 243 lb (110.2 kg)   SpO2 97%   BMI 35.88 kg/m   Opioid Risk Score:   Fall Risk Score:  `1  Depression screen PHQ 2/9  Depression screen [facility] 2/9 12/17/2017 12/16/2017 11/26/2017 11/14/2017 10/15/2017 04/05/2017 03/15/2016  Decreased Interest 0 0 0 0 - 0 0  Down, Depressed, Hopeless 0 0 0 0 1 0 0  PHQ - 2 Score 0 0 0 0 1 0 0  Altered sleeping - - - - 3 - 0  Tired, decreased energy - - - - 3 - 0  Change in appetite - - - - 1 - 0  Feeling bad or failure about yourself  - - - - 1 - 0  Trouble concentrating - - - - 0 - 0  Moving slowly or fidgety/restless - - - - 0 - 0  Suicidal thoughts - - - - 0 - 0  PHQ-9 Score - - - - 9 - 0  Difficult doing work/chores - - - - Somewhat difficult - Not  difficult at all    Review of Systems  Constitutional: Positive for diaphoresis.  HENT: Negative.   Eyes: Negative.   Respiratory: Positive for apnea.   Cardiovascular: Negative.   Gastrointestinal: Negative.   Musculoskeletal:       Spasms   Neurological: Positive for numbness.       Tingling  Hematological: Negative.   Psychiatric/Behavioral: Negative.        Objective:   Physical Exam General: No acute distress. Abdominally obese HEENT: EOMI, oral membranes moist Cards: reg rate  Chest: normal effort Abdomen: Soft, NT, ND Skin: dry, intact Extremities: no edema Neuro: Pt is cognitively appropriate with normal insight, memory, and awareness. Cranial nerves 2-12 are intact. Sensory exam is normal except for in the lateral femoral cutaneous distribution bilaterally. Reflexes are 2+ in all 4's. Fine motor coordination is intact. No tremors. Motor function is grossly 5/5.  Musculoskeletal:  Right shoulder tender at [facility] joint with palpation. ER/IR produced pain as well as impingement maneuver but lesser so today. More pain along biceps tendons, long>short head. . Crepitus with PROM/AROM. Mild pain over upper thoracic and lower cervical paraspinals. Head forward posture. Back rom functional.  Psych: Pt's affect is appropriate. Pt is cooperative       Assessment & Plan:  1. Right shoulder pain likely rotator cuff tendonitis with associated DJD and AC jt arthritis 2. Meralgia Paresthetica bilaterally likely due to his abdominal girth and diabetes.  3. Cervical whiplash injury which appears to be improving.  4. DM 2 5.  Lower extremity cramping, this is not related to his back.  Could this be related to Actos?  Plan: 1.  Counseled patient extensively on potential treatments for his meralgia paresthetica.    -weight loss discussed again (especially for diabetes) 2.  Continue gabapentin to 600 mg 4 times daily  -add flexeril for spasms hs prn.   -Patient needs to talk with  primary about actos---may be causing LE cramping 3.  Not overly anxious to inject his lateral femoral cutaneous nerve given his diabetes and the chronicity of the problem 4.  After informed consent and preparation of the skin with betadine and isopropyl alcohol, I injected 6mg  (1cc) of celestone and 4cc of 1% lidocaine around the long head biceps tendon via anterior approach. Additionally, aspiration was performed prior to injection. The patient tolerated well, and no complications were encountered. Afterward the area was cleaned and dressed. Post- injection instructions were provided.     5.  Received outpt OT x 1. Will not send back to [facility] for therapies. Continue with HEP 6.  Consider MRI if he fails to improve after injection 7. Oxycodone 5mg  prn for breakthrough pain q12 prn #60.   -We will continue the controlled substance monitoring program, this consists of regular clinic visits, examinations, routine drug screening, pill counts as well as use of [place] Controlled Substance Reporting System. [place] was reviewed today.    I will see the patient back in about 6 weeks time.  15 minutes was spent with the patient in direct consultation today.

## 2018-01-21 NOTE — Patient Instructions (Signed)
PLEASE FEEL FREE TO CALL OUR OFFICE WITH ANY PROBLEMS OR QUESTIONS ([phone])      

## 2018-01-23 ENCOUNTER — Encounter: Payer: Self-pay | Admitting: Family Medicine

## 2018-02-16 ENCOUNTER — Other Ambulatory Visit: Payer: Self-pay | Admitting: Physician Assistant

## 2018-02-16 DIAGNOSIS — I10 Essential (primary) hypertension: Secondary | ICD-10-CM

## 2018-02-18 ENCOUNTER — Other Ambulatory Visit: Payer: Self-pay | Admitting: Family Medicine

## 2018-02-18 ENCOUNTER — Encounter: Payer: Self-pay | Admitting: Family Medicine

## 2018-02-18 MED ORDER — FUROSEMIDE 20 MG PO TABS: 20.0000 mg | ORAL_TABLET | Freq: Every day | ORAL | 0 refills | Status: DC | PRN

## 2018-02-23 ENCOUNTER — Other Ambulatory Visit: Payer: Self-pay | Admitting: Family Medicine

## 2018-02-23 DIAGNOSIS — G5711 Meralgia paresthetica, right lower limb: Secondary | ICD-10-CM

## 2018-03-04 ENCOUNTER — Encounter: Payer: BLUE CROSS/BLUE SHIELD | Admitting: Physical Medicine & Rehabilitation

## 2018-03-18 ENCOUNTER — Encounter: Payer: BLUE CROSS/BLUE SHIELD | Admitting: Physical Medicine & Rehabilitation

## 2018-03-19 ENCOUNTER — Telehealth: Payer: Self-pay | Admitting: Physical Medicine & Rehabilitation

## 2018-03-19 ENCOUNTER — Encounter: Payer: BLUE CROSS/BLUE SHIELD | Admitting: Physical Medicine & Rehabilitation

## 2018-03-19 DIAGNOSIS — M75101 Unspecified rotator cuff tear or rupture of right shoulder, not specified as traumatic: Secondary | ICD-10-CM

## 2018-03-19 DIAGNOSIS — M7521 Bicipital tendinitis, right shoulder: Secondary | ICD-10-CM

## 2018-03-19 DIAGNOSIS — G5711 Meralgia paresthetica, right lower limb: Secondary | ICD-10-CM

## 2018-03-19 DIAGNOSIS — G894 Chronic pain syndrome: Secondary | ICD-10-CM

## 2018-03-19 MED ORDER — OXYCODONE HCL 5 MG PO TABS: 5.0000 mg | ORAL_TABLET | Freq: Two times a day (BID) | ORAL | 0 refills | Status: DC | PRN

## 2018-03-19 NOTE — Telephone Encounter (Signed)
Patient is canceling today's visit due to being sick.  He has also canceled 2 previous appointments earlier this month due to being out of town.  He states he been without his oxycodone and would like to know if Dr. Naaman Plummer will refill it.  Please advise.

## 2018-03-19 NOTE — Telephone Encounter (Signed)
Spoke with wife and informed that a script for #30 oxycodone was sent to the pharmacy.  I informed that the patient needs to make an appointment in order to receive future scripts. Patients wife verbalized understanding

## 2018-03-19 NOTE — Telephone Encounter (Signed)
He may have #30. Needs to follow up with eunice next month

## 2018-03-26 ENCOUNTER — Other Ambulatory Visit: Payer: Self-pay

## 2018-03-26 ENCOUNTER — Encounter
Payer: BLUE CROSS/BLUE SHIELD | Attending: Physical Medicine & Rehabilitation | Admitting: Physical Medicine & Rehabilitation

## 2018-03-26 ENCOUNTER — Encounter: Payer: Self-pay | Admitting: Physical Medicine & Rehabilitation

## 2018-03-26 DIAGNOSIS — M7521 Bicipital tendinitis, right shoulder: Secondary | ICD-10-CM

## 2018-03-26 DIAGNOSIS — G894 Chronic pain syndrome: Secondary | ICD-10-CM | POA: Diagnosis not present

## 2018-03-26 DIAGNOSIS — E7800 Pure hypercholesterolemia, unspecified: Secondary | ICD-10-CM | POA: Insufficient documentation

## 2018-03-26 DIAGNOSIS — I2510 Atherosclerotic heart disease of native coronary artery without angina pectoris: Secondary | ICD-10-CM | POA: Insufficient documentation

## 2018-03-26 DIAGNOSIS — M75101 Unspecified rotator cuff tear or rupture of right shoulder, not specified as traumatic: Secondary | ICD-10-CM | POA: Diagnosis not present

## 2018-03-26 DIAGNOSIS — Z8249 Family history of ischemic heart disease and other diseases of the circulatory system: Secondary | ICD-10-CM | POA: Insufficient documentation

## 2018-03-26 DIAGNOSIS — E669 Obesity, unspecified: Secondary | ICD-10-CM | POA: Insufficient documentation

## 2018-03-26 DIAGNOSIS — G5711 Meralgia paresthetica, right lower limb: Secondary | ICD-10-CM | POA: Diagnosis not present

## 2018-03-26 DIAGNOSIS — K219 Gastro-esophageal reflux disease without esophagitis: Secondary | ICD-10-CM | POA: Insufficient documentation

## 2018-03-26 DIAGNOSIS — S134XXD Sprain of ligaments of cervical spine, subsequent encounter: Secondary | ICD-10-CM | POA: Insufficient documentation

## 2018-03-26 DIAGNOSIS — F419 Anxiety disorder, unspecified: Secondary | ICD-10-CM | POA: Insufficient documentation

## 2018-03-26 DIAGNOSIS — I255 Ischemic cardiomyopathy: Secondary | ICD-10-CM | POA: Insufficient documentation

## 2018-03-26 DIAGNOSIS — I252 Old myocardial infarction: Secondary | ICD-10-CM | POA: Insufficient documentation

## 2018-03-26 DIAGNOSIS — Z87891 Personal history of nicotine dependence: Secondary | ICD-10-CM | POA: Insufficient documentation

## 2018-03-26 DIAGNOSIS — Z87442 Personal history of urinary calculi: Secondary | ICD-10-CM | POA: Insufficient documentation

## 2018-03-26 DIAGNOSIS — G5713 Meralgia paresthetica, bilateral lower limbs: Secondary | ICD-10-CM | POA: Insufficient documentation

## 2018-03-26 DIAGNOSIS — I10 Essential (primary) hypertension: Secondary | ICD-10-CM | POA: Insufficient documentation

## 2018-03-26 DIAGNOSIS — Z6835 Body mass index (BMI) 35.0-35.9, adult: Secondary | ICD-10-CM | POA: Insufficient documentation

## 2018-03-26 DIAGNOSIS — M25511 Pain in right shoulder: Secondary | ICD-10-CM | POA: Insufficient documentation

## 2018-03-26 DIAGNOSIS — Z8711 Personal history of peptic ulcer disease: Secondary | ICD-10-CM | POA: Insufficient documentation

## 2018-03-26 DIAGNOSIS — Z951 Presence of aortocoronary bypass graft: Secondary | ICD-10-CM | POA: Insufficient documentation

## 2018-03-26 DIAGNOSIS — M1990 Unspecified osteoarthritis, unspecified site: Secondary | ICD-10-CM | POA: Insufficient documentation

## 2018-03-26 DIAGNOSIS — E1142 Type 2 diabetes mellitus with diabetic polyneuropathy: Secondary | ICD-10-CM | POA: Insufficient documentation

## 2018-03-26 DIAGNOSIS — Z823 Family history of stroke: Secondary | ICD-10-CM | POA: Insufficient documentation

## 2018-03-26 MED ORDER — OXYCODONE HCL 5 MG PO TABS: 5.0000 mg | ORAL_TABLET | Freq: Two times a day (BID) | ORAL | 0 refills | Status: DC | PRN
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
## 2018-03-28 ENCOUNTER — Other Ambulatory Visit: Payer: Self-pay | Admitting: Cardiovascular Disease

## 2018-03-28 ENCOUNTER — Other Ambulatory Visit: Payer: Self-pay | Admitting: Family Medicine

## 2018-03-28 ENCOUNTER — Encounter: Payer: Self-pay | Admitting: Family Medicine

## 2018-03-28 DIAGNOSIS — I10 Essential (primary) hypertension: Secondary | ICD-10-CM

## 2018-04-07 ENCOUNTER — Other Ambulatory Visit: Payer: Self-pay | Admitting: Cardiology

## 2018-04-07 ENCOUNTER — Other Ambulatory Visit: Payer: Self-pay | Admitting: Cardiovascular Disease

## 2018-04-07 ENCOUNTER — Encounter (HOSPITAL_COMMUNITY): Payer: Self-pay

## 2018-04-07 ENCOUNTER — Other Ambulatory Visit: Payer: Self-pay | Admitting: Family Medicine

## 2018-04-07 DIAGNOSIS — I10 Essential (primary) hypertension: Secondary | ICD-10-CM

## 2018-04-07 NOTE — Therapy (Signed)
[place], [place], [address] Phone: [phone]   Fax:  [phone]  Patient Details  Name: Juan Ray MRN: [id number] Date of Birth: 1960-09-22 Referring Provider:  No ref. provider found  Encounter Date: 04/07/2018  OCCUPATIONAL THERAPY DISCHARGE SUMMARY  Visits from Start of Care: 1  Current functional level related to goals / functional outcomes: See evaluation for functional level related to goals. Patient did not return to clinic after initial evaluation. Current functional levels are unknown at this time.    Remaining deficits: See evaluation for deficits.    Education / Equipment: HEP received at evaluation. Plan: Patient agrees to discharge.  Patient goals were not met. Patient is being discharged due to not returning since the last visit.  ?????           Ailene Ravel, OTR/L,CBIS  [phone]  04/07/2018, 10:46 AM  [place], [place], [address] Phone: [phone]   Fax:  [phone]

## 2018-04-07 NOTE — Telephone Encounter (Signed)
Last seen 12/17/17  Dr Livia Snellen PCP

## 2018-04-08 ENCOUNTER — Other Ambulatory Visit: Payer: Self-pay | Admitting: Cardiovascular Disease

## 2018-04-08 DIAGNOSIS — I10 Essential (primary) hypertension: Secondary | ICD-10-CM

## 2018-04-14 ENCOUNTER — Other Ambulatory Visit: Payer: Self-pay | Admitting: Family Medicine

## 2018-04-14 DIAGNOSIS — G5711 Meralgia paresthetica, right lower limb: Secondary | ICD-10-CM

## 2018-04-20 ENCOUNTER — Other Ambulatory Visit: Payer: Self-pay | Admitting: Family Medicine

## 2018-04-29 ENCOUNTER — Other Ambulatory Visit: Payer: Self-pay | Admitting: Family Medicine

## 2018-04-30 ENCOUNTER — Telehealth: Payer: Self-pay | Admitting: Family Medicine

## 2018-05-01 ENCOUNTER — Encounter: Payer: Self-pay | Admitting: Family Medicine

## 2018-05-01 ENCOUNTER — Ambulatory Visit: Payer: BLUE CROSS/BLUE SHIELD | Admitting: Family Medicine

## 2018-05-01 ENCOUNTER — Other Ambulatory Visit: Payer: Self-pay

## 2018-05-01 VITALS — BP 135/81 | HR 70 | Temp 97.6°F | Ht 69.0 in | Wt 245.0 lb

## 2018-05-01 DIAGNOSIS — G5711 Meralgia paresthetica, right lower limb: Secondary | ICD-10-CM

## 2018-05-01 DIAGNOSIS — I10 Essential (primary) hypertension: Secondary | ICD-10-CM

## 2018-05-01 DIAGNOSIS — E1165 Type 2 diabetes mellitus with hyperglycemia: Secondary | ICD-10-CM | POA: Diagnosis not present

## 2018-05-01 DIAGNOSIS — IMO0002 Reserved for concepts with insufficient information to code with codable children: Secondary | ICD-10-CM

## 2018-05-01 DIAGNOSIS — E1142 Type 2 diabetes mellitus with diabetic polyneuropathy: Secondary | ICD-10-CM

## 2018-05-01 LAB — CMP14+EGFR
ALT: 23 IU/L (ref 0–44)
AST: 16 IU/L (ref 0–40)
Albumin/Globulin Ratio: 1.9 (ref 1.2–2.2)
Albumin: 4.2 g/dL (ref 3.8–4.9)
Alkaline Phosphatase: 77 IU/L (ref 39–117)
BUN/Creatinine Ratio: 24 — ABNORMAL HIGH (ref 9–20)
BUN: 24 mg/dL (ref 6–24)
Bilirubin Total: 0.7 mg/dL (ref 0.0–1.2)
CO2: 24 mmol/L (ref 20–29)
Calcium: 9.4 mg/dL (ref 8.7–10.2)
Chloride: 100 mmol/L (ref 96–106)
Creatinine, Ser: 1.02 mg/dL (ref 0.76–1.27)
GFR calc Af Amer: 93 mL/min/{1.73_m2} (ref >59)
GFR calc non Af Amer: 80 mL/min/{1.73_m2} (ref >59)
Globulin, Total: 2.2 g/dL (ref 1.5–4.5)
Glucose: 177 mg/dL — ABNORMAL HIGH (ref 65–99)
Potassium: 4.5 mmol/L (ref 3.5–5.2)
Sodium: 139 mmol/L (ref 134–144)
Total Protein: 6.4 g/dL (ref 6.0–8.5)

## 2018-05-01 LAB — CBC WITH DIFFERENTIAL/PLATELET
Basophils Absolute: 0.1 10*3/uL (ref 0.0–0.2)
Basos: 1 %
EOS (ABSOLUTE): 0.2 10*3/uL (ref 0.0–0.4)
Eos: 3 %
Hematocrit: 45.8 % (ref 37.5–51.0)
Hemoglobin: 15.8 g/dL (ref 13.0–17.7)
Immature Grans (Abs): 0 10*3/uL (ref 0.0–0.1)
Immature Granulocytes: 0 %
Lymphocytes Absolute: 2.2 10*3/uL (ref 0.7–3.1)
Lymphs: 25 %
MCH: 32.7 pg (ref 26.6–33.0)
MCHC: 34.5 g/dL (ref 31.5–35.7)
MCV: 95 fL (ref 79–97)
Monocytes Absolute: 1.2 10*3/uL — ABNORMAL HIGH (ref 0.1–0.9)
Monocytes: 14 %
Neutrophils Absolute: 5.1 10*3/uL (ref 1.4–7.0)
Neutrophils: 57 %
Platelets: 248 10*3/uL (ref 150–450)
RBC: 4.83 x10E6/uL (ref 4.14–5.80)
RDW: 12.6 % (ref 11.6–15.4)
WBC: 8.8 10*3/uL (ref 3.4–10.8)

## 2018-05-01 LAB — BAYER DCA HB A1C WAIVED: HB A1C (BAYER DCA - WAIVED): 7.3 % — ABNORMAL HIGH (ref <7.0)

## 2018-05-01 MED ORDER — LISINOPRIL 2.5 MG PO TABS: 2.5000 mg | ORAL_TABLET | Freq: Every day | ORAL | 2 refills | Status: DC

## 2018-05-01 MED ORDER — METOPROLOL TARTRATE 25 MG PO TABS: ORAL_TABLET | ORAL | 3 refills | Status: DC

## 2018-05-01 MED ORDER — METFORMIN HCL 1000 MG PO TABS: 1000.0000 mg | ORAL_TABLET | Freq: Two times a day (BID) | ORAL | 0 refills | Status: DC

## 2018-05-01 MED ORDER — SITAGLIPTIN PHOSPHATE 100 MG PO TABS: 100.0000 mg | ORAL_TABLET | Freq: Every day | ORAL | 2 refills | Status: DC

## 2018-05-01 MED ORDER — SILDENAFIL CITRATE 20 MG PO TABS: ORAL_TABLET | ORAL | 5 refills | Status: DC

## 2018-05-01 MED ORDER — GABAPENTIN 600 MG PO TABS: 600.0000 mg | ORAL_TABLET | Freq: Three times a day (TID) | ORAL | 2 refills | Status: DC

## 2018-05-01 MED ORDER — FUROSEMIDE 20 MG PO TABS: 20.0000 mg | ORAL_TABLET | Freq: Every day | ORAL | 3 refills | Status: DC | PRN

## 2018-05-01 MED ORDER — BLOOD GLUCOSE METER KIT: PACK | 0 refills | Status: DC

## 2018-05-01 MED ORDER — EMPAGLIFLOZIN 25 MG PO TABS: 25.0000 mg | ORAL_TABLET | Freq: Every day | ORAL | 0 refills | Status: DC

## 2018-05-01 MED ORDER — PANTOPRAZOLE SODIUM 40 MG PO TBEC: 40.0000 mg | DELAYED_RELEASE_TABLET | Freq: Every day | ORAL | 11 refills | Status: DC

## 2018-05-01 MED ORDER — ATORVASTATIN CALCIUM 40 MG PO TABS: 40.0000 mg | ORAL_TABLET | Freq: Every day | ORAL | 0 refills | Status: DC

## 2018-05-01 MED ORDER — PIOGLITAZONE HCL 30 MG PO TABS: 30.0000 mg | ORAL_TABLET | Freq: Every day | ORAL | 3 refills | Status: DC

## 2018-05-01 MED ORDER — ESCITALOPRAM OXALATE 10 MG PO TABS: 10.0000 mg | ORAL_TABLET | Freq: Every day | ORAL | 3 refills | Status: DC

## 2018-05-01 NOTE — Progress Notes (Signed)
Subjective:    Patient ID: Juan Ray, male    DOB: 06-18-1960, 58 y.o.   MRN: [id number]   HPI: Juan Ray is a 58 y.o. male presenting for follow-up of diabetes. Patient does not check blood sugar at home Patient denies symptoms such as polyuria, polydipsia, excessive hunger, nausea No significant hypoglycemic spells noted. Medications as noted below. Taking them regularly without complication/adverse reaction being reported today.   Depression screen [facility] 2/9 05/01/2018 12/17/2017 12/16/2017 11/26/2017 11/14/2017  Decreased Interest 0 0 0 0 0  Down, Depressed, Hopeless 0 0 0 0 0  PHQ - 2 Score 0 0 0 0 0  Altered sleeping - - - - -  Tired, decreased energy - - - - -  Change in appetite - - - - -  Feeling bad or failure about yourself  - - - - -  Trouble concentrating - - - - -  Moving slowly or fidgety/restless - - - - -  Suicidal thoughts - - - - -  PHQ-9 Score - - - - -  Difficult doing work/chores - - - - -     Relevant past medical, surgical, family and social history reviewed and updated as indicated.  Interim medical history since our last visit reviewed. Allergies and medications reviewed and updated.  ROS:  Review of Systems  Constitutional: Negative.   HENT: Negative.   Eyes: Negative for visual disturbance.  Respiratory: Negative for cough and shortness of breath.   Cardiovascular: Negative for chest pain and leg swelling.  Gastrointestinal: Negative for abdominal pain, diarrhea, nausea and vomiting.  Genitourinary: Negative for difficulty urinating.  Musculoskeletal: Negative for arthralgias and myalgias.  Skin: Negative for rash.  Neurological: Negative for headaches.  Psychiatric/Behavioral: Negative for sleep disturbance.     Social History   Tobacco Use  Smoking Status Former Smoker  . Packs/day: 1.00  . Years: 20.00  . Pack years: 20.00  . Types: Cigarettes  . Last attempt to quit: 2000  . Years since quitting: 20.3  Smokeless  Tobacco Former Systems developer  . Quit date: 01/01/1998       Objective:     Wt Readings from Last 3 Encounters:  05/01/18 245 lb (111.1 kg)  03/26/18 242 lb (109.8 kg)  01/21/18 243 lb (110.2 kg)     Exam deferred. Pt. Harboring due to COVID 19. Phone visit performed.   Assessment & Plan:   1. Essential hypertension   2. Uncontrolled type 2 diabetes mellitus with diabetic polyneuropathy, without long-term current use of insulin ([place])   3. Meralgia paresthetica of right side     Meds ordered this encounter  Medications  . DISCONTD: sildenafil (REVATIO) 20 MG tablet    Sig: Take 2-5 pills at once, orally, with each sexual encounter    Dispense:  50 tablet    Refill:  5  . sildenafil (REVATIO) 20 MG tablet    Sig: Take 2-5 pills at once, orally, with each sexual encounter    Dispense:  50 tablet    Refill:  5  . pantoprazole (PROTONIX) 40 MG tablet    Sig: Take 1 tablet (40 mg total) by mouth daily.    Dispense:  30 tablet    Refill:  11    Please consider 90 day supplies to promote better adherence  . metoprolol tartrate (LOPRESSOR) 25 MG tablet    Sig: TAKE 1 TABLET BY MOUTH TWICE DAILY (PATIENT  NEEDS  TO  SCHEDULE  APPOINTMENT  TO  RECEIVE  FURTHER  REFILLS)    Dispense:  180 tablet    Refill:  3  . metFORMIN (GLUCOPHAGE) 1000 MG tablet    Sig: Take 1 tablet (1,000 mg total) by mouth 2 (two) times daily.    Dispense:  180 tablet    Refill:  0  . lisinopril (ZESTRIL) 2.5 MG tablet    Sig: Take 1 tablet (2.5 mg total) by mouth daily.    Dispense:  90 tablet    Refill:  2  . empagliflozin (JARDIANCE) 25 MG TABS tablet    Sig: Take 25 mg by mouth daily.    Dispense:  90 tablet    Refill:  0  . gabapentin (NEURONTIN) 600 MG tablet    Sig: Take 1 tablet (600 mg total) by mouth 3 (three) times daily.    Dispense:  90 tablet    Refill:  2  . escitalopram (LEXAPRO) 10 MG tablet    Sig: Take 1 tablet (10 mg total) by mouth daily.    Dispense:  90 tablet    Refill:  3  .  atorvastatin (LIPITOR) 40 MG tablet    Sig: Take 1 tablet (40 mg total) by mouth daily.    Dispense:  90 tablet    Refill:  0  . sitaGLIPtin (JANUVIA) 100 MG tablet    Sig: Take 1 tablet (100 mg total) by mouth daily.    Dispense:  30 tablet    Refill:  2  . furosemide (LASIX) 20 MG tablet    Sig: Take 1 tablet (20 mg total) by mouth daily as needed for fluid.    Dispense:  30 tablet    Refill:  3  . pioglitazone (ACTOS) 30 MG tablet    Sig: Take 1 tablet (30 mg total) by mouth daily. )    Dispense:  30 tablet    Refill:  3  . blood glucose meter kit and supplies    Sig: Dispense based on patient and insurance preference. Use up to four times daily as directed. (FOR ICD-10 E10.9, E11.9).    Dispense:  1 each    Refill:  0    Order Specific Question:   Number of strips    Answer:   100    Order Specific Question:   Number of lancets    Answer:   100    Orders Placed This Encounter  Procedures  . CBC with Differential/Platelet  . CMP14+EGFR  . Bayer DCA Hb A1c Waived  . Microalbumin / creatinine urine ratio      Juan Ray was seen today for medical management of chronic issues.  Diagnoses and all orders for this visit:  Essential hypertension -     CBC with Differential/Platelet -     CMP14+EGFR -     metoprolol tartrate (LOPRESSOR) 25 MG tablet; TAKE 1 TABLET BY MOUTH TWICE DAILY (PATIENT  NEEDS  TO  SCHEDULE  APPOINTMENT  TO  RECEIVE  FURTHER  REFILLS)  Uncontrolled type 2 diabetes mellitus with diabetic polyneuropathy, without long-term current use of insulin ([facility]) -     Bayer DCA Hb A1c Waived -     Microalbumin / creatinine urine ratio  Meralgia paresthetica of right side -     gabapentin (NEURONTIN) 600 MG tablet; Take 1 tablet (600 mg total) by mouth 3 (three) times daily.  Other orders -     Discontinue: sildenafil (REVATIO) 20 MG tablet; Take 2-5 pills at once, orally, with each sexual encounter -  sildenafil (REVATIO) 20 MG tablet; Take 2-5 pills at once,  orally, with each sexual encounter -     pantoprazole (PROTONIX) 40 MG tablet; Take 1 tablet (40 mg total) by mouth daily. -     metFORMIN (GLUCOPHAGE) 1000 MG tablet; Take 1 tablet (1,000 mg total) by mouth 2 (two) times daily. -     lisinopril (ZESTRIL) 2.5 MG tablet; Take 1 tablet (2.5 mg total) by mouth daily. -     empagliflozin (JARDIANCE) 25 MG TABS tablet; Take 25 mg by mouth daily. -     escitalopram (LEXAPRO) 10 MG tablet; Take 1 tablet (10 mg total) by mouth daily. -     atorvastatin (LIPITOR) 40 MG tablet; Take 1 tablet (40 mg total) by mouth daily. -     sitaGLIPtin (JANUVIA) 100 MG tablet; Take 1 tablet (100 mg total) by mouth daily. -     furosemide (LASIX) 20 MG tablet; Take 1 tablet (20 mg total) by mouth daily as needed for fluid. -     pioglitazone (ACTOS) 30 MG tablet; Take 1 tablet (30 mg total) by mouth daily. ) -     blood glucose meter kit and supplies; Dispense based on patient and insurance preference. Use up to four times daily as directed. (FOR ICD-10 E10.9, E11.9).    Virtual Visit via telephone Note  I discussed the limitations, risks, security and privacy concerns of performing an evaluation and management service by telephone and the availability of in person appointments. The patient was identified with two identifiers. Pt.expressed understanding and agreed to proceed. Pt. Is at home. Dr. Livia Snellen is in his office.  Follow Up Instructions:   I discussed the assessment and treatment plan with the patient. The patient was provided an opportunity to ask questions and all were answered. The patient agreed with the plan and demonstrated an understanding of the instructions.   The patient was advised to call back or seek an in-person evaluation if the symptoms worsen or if the condition fails to improve as anticipated.  Visit started: 9:30 Call ended:  9:50 Total minutes including chart review and phone contact time: 25   Follow up plan: Return in about 3 months  (around 07/31/2018).  Claretta Fraise, MD [place]

## 2018-05-02 LAB — MICROALBUMIN / CREATININE URINE RATIO
Creatinine, Urine: 152.9 mg/dL
Microalb/Creat Ratio: 4 mg/g creat (ref 0–29)
Microalbumin, Urine: 6.3 ug/mL

## 2018-05-04 ENCOUNTER — Encounter: Payer: Self-pay | Admitting: Family Medicine

## 2018-05-05 ENCOUNTER — Other Ambulatory Visit: Payer: Self-pay | Admitting: Family Medicine

## 2018-05-07 ENCOUNTER — Other Ambulatory Visit: Payer: Self-pay

## 2018-05-07 ENCOUNTER — Encounter
Payer: BLUE CROSS/BLUE SHIELD | Attending: Physical Medicine & Rehabilitation | Admitting: Physical Medicine & Rehabilitation

## 2018-05-07 ENCOUNTER — Encounter: Payer: Self-pay | Admitting: Physical Medicine & Rehabilitation

## 2018-05-07 DIAGNOSIS — M7521 Bicipital tendinitis, right shoulder: Secondary | ICD-10-CM

## 2018-05-07 DIAGNOSIS — Z8249 Family history of ischemic heart disease and other diseases of the circulatory system: Secondary | ICD-10-CM | POA: Insufficient documentation

## 2018-05-07 DIAGNOSIS — M75101 Unspecified rotator cuff tear or rupture of right shoulder, not specified as traumatic: Secondary | ICD-10-CM | POA: Diagnosis not present

## 2018-05-07 DIAGNOSIS — Z951 Presence of aortocoronary bypass graft: Secondary | ICD-10-CM | POA: Insufficient documentation

## 2018-05-07 DIAGNOSIS — E669 Obesity, unspecified: Secondary | ICD-10-CM | POA: Insufficient documentation

## 2018-05-07 DIAGNOSIS — Z87891 Personal history of nicotine dependence: Secondary | ICD-10-CM | POA: Insufficient documentation

## 2018-05-07 DIAGNOSIS — Z87442 Personal history of urinary calculi: Secondary | ICD-10-CM | POA: Insufficient documentation

## 2018-05-07 DIAGNOSIS — G5711 Meralgia paresthetica, right lower limb: Secondary | ICD-10-CM

## 2018-05-07 DIAGNOSIS — I2510 Atherosclerotic heart disease of native coronary artery without angina pectoris: Secondary | ICD-10-CM | POA: Insufficient documentation

## 2018-05-07 DIAGNOSIS — I255 Ischemic cardiomyopathy: Secondary | ICD-10-CM | POA: Insufficient documentation

## 2018-05-07 DIAGNOSIS — E1142 Type 2 diabetes mellitus with diabetic polyneuropathy: Secondary | ICD-10-CM | POA: Insufficient documentation

## 2018-05-07 DIAGNOSIS — I10 Essential (primary) hypertension: Secondary | ICD-10-CM | POA: Insufficient documentation

## 2018-05-07 DIAGNOSIS — G894 Chronic pain syndrome: Secondary | ICD-10-CM

## 2018-05-07 DIAGNOSIS — K219 Gastro-esophageal reflux disease without esophagitis: Secondary | ICD-10-CM | POA: Insufficient documentation

## 2018-05-07 DIAGNOSIS — I252 Old myocardial infarction: Secondary | ICD-10-CM | POA: Insufficient documentation

## 2018-05-07 DIAGNOSIS — S134XXD Sprain of ligaments of cervical spine, subsequent encounter: Secondary | ICD-10-CM | POA: Insufficient documentation

## 2018-05-07 DIAGNOSIS — M1990 Unspecified osteoarthritis, unspecified site: Secondary | ICD-10-CM | POA: Insufficient documentation

## 2018-05-07 DIAGNOSIS — M25511 Pain in right shoulder: Secondary | ICD-10-CM | POA: Insufficient documentation

## 2018-05-07 DIAGNOSIS — G5713 Meralgia paresthetica, bilateral lower limbs: Secondary | ICD-10-CM | POA: Insufficient documentation

## 2018-05-07 DIAGNOSIS — Z823 Family history of stroke: Secondary | ICD-10-CM | POA: Insufficient documentation

## 2018-05-07 DIAGNOSIS — Z6835 Body mass index (BMI) 35.0-35.9, adult: Secondary | ICD-10-CM | POA: Insufficient documentation

## 2018-05-07 DIAGNOSIS — F419 Anxiety disorder, unspecified: Secondary | ICD-10-CM | POA: Insufficient documentation

## 2018-05-07 DIAGNOSIS — Z8711 Personal history of peptic ulcer disease: Secondary | ICD-10-CM | POA: Insufficient documentation

## 2018-05-07 DIAGNOSIS — M1711 Unilateral primary osteoarthritis, right knee: Secondary | ICD-10-CM | POA: Insufficient documentation

## 2018-05-07 DIAGNOSIS — E7800 Pure hypercholesterolemia, unspecified: Secondary | ICD-10-CM | POA: Insufficient documentation

## 2018-05-07 MED ORDER — OXYCODONE HCL 5 MG PO TABS: 5.0000 mg | ORAL_TABLET | Freq: Two times a day (BID) | ORAL | 0 refills | Status: DC | PRN

## 2018-05-07 NOTE — Progress Notes (Signed)
Subjective:    Patient ID: Juan Ray, male    DOB: 06-Oct-1960, 58 y.o.   MRN: [id number]  HPI   Due to national recommendations of social distancing because of COVID 56, an audio/video tele-health visit is felt to be the most appropriate encounter for this patient at this time. See MyChart message from today for the patient's consent to a tele-health encounter with [facility]. This is a follow up telephone visit for the patient who is at home. MD is at office.    I am meeting with the patient today regarding his chronic pain. For some reason his oxycodone was written for only 30 last month despite the fact that I told him I was writing for 60. It might have been my mistake. He is having increased right shoulder pain again as well as ongoing pain in his right knee. He is working on weight loss and diet. He last lost 7lbs over the last month. 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 He remains on gabapentin for that pain.    Pain Inventory Average Pain 8 Pain Right Now 4 My pain is intermittent  In the last 24 hours, has pain interfered with the following? General activity 4 Relation with others 4 Enjoyment of life 4 What TIME of day is your pain at its worst? varies Sleep (in general) Fair  Pain is worse with: sitting, inactivity and some activites Pain improves with: medication and injections Relief from Meds: 8  Mobility walk without assistance how many minutes can you walk? 60 ability to climb steps?  yes do you drive?  yes  Function employed # of hrs/week 40  Neuro/Psych tingling  Prior Studies Any changes since last visit?  no  Physicians involved in your care Primary care Claretta Fraise   Family History  Problem Relation Age of Onset  . Heart attack Father        MI x 2 in late 50's, currently 87's  . CVA Sister        late 21s  . CAD Maternal Uncle        MI  at 92  . Colon cancer Neg Hx   . Colon polyps Neg Hx    Social History   Socioeconomic History  . Marital status: Married    Spouse name: Not on file  . Number of children: 2  . Years of education: Not on file  . Highest education level: Not on file  Occupational History  . Occupation: truck Animator Needs  . Financial resource strain: Not on file  . Food insecurity:    Worry: Not on file    Inability: Not on file  . Transportation needs:    Medical: Not on file    Non-medical: Not on file  Tobacco Use  . Smoking status: Former Smoker    Packs/day: 1.00    Years: 20.00    Pack years: 20.00    Types: Cigarettes    Last attempt to quit: 2000    Years since quitting: 20.3  . Smokeless tobacco: Former Systems developer    Quit date: 01/01/1998  Substance and Sexual Activity  . Alcohol use: No  . Drug use: No  . Sexual activity: Yes  Lifestyle  . Physical activity:    Days per week: Not on file    Minutes per session: Not on file  . Stress: Not on file  Relationships  .  Social connections:    Talks on phone: Not on file    Gets together: Not on file    Attends religious service: Not on file    Active member of club or organization: Not on file    Attends meetings of clubs or organizations: Not on file    Relationship status: Not on file  Other Topics Concern  . Not on file  Social History Narrative   Lives in [place], with his wife and 2 children   Past Surgical History:  Procedure Laterality Date  . BIOPSY  09/24/2017   Procedure: BIOPSY;  Surgeon: Danie Binder, MD;  [facility];  Service: Endoscopy;;  gastric bx's  . COLONOSCOPY WITH PROPOFOL N/A 09/24/2017   Procedure: COLONOSCOPY WITH PROPOFOL;  Surgeon: Danie Binder, MD;  [facility];  Service: Endoscopy;  Laterality: N/A;  7:30am  . CORONARY ANGIOPLASTY WITH STENT PLACEMENT  04/07/2012   70% prox LAD, 70-80% ostial diagonal, 70% mid-distal LAD, 80% prox OM1, mid LCx totally occluded  just distal to OM1 s/p DES, occluded RCA; severe inferior HK, LVEF 45%  . CORONARY ARTERY BYPASS GRAFT N/A 04/23/2016   Procedure: CORONARY ARTERY BYPASS GRAFTING (CABG) x 4;  Surgeon: Gaye Pollack, MD;  Location: [facility];  Service: Open Heart Surgery;  Laterality: N/A;  . ENDOVEIN HARVEST OF GREATER SAPHENOUS VEIN Right 04/23/2016   Procedure: ENDOVEIN HARVEST OF GREATER SAPHENOUS VEIN;  Surgeon: Gaye Pollack, MD;  Location: [facility];  Service: Open Heart Surgery;  Laterality: Right;  . ESOPHAGOGASTRODUODENOSCOPY  06/18/2011   multiple ulcers in proximal stomach with stigmata of bleeding s/p biopsy, multiple superficial ulcers in duodenal bulb. normal ampulla and second portion of duodenum  . ESOPHAGOGASTRODUODENOSCOPY  2013  . ESOPHAGOGASTRODUODENOSCOPY (EGD) WITH PROPOFOL N/A 09/24/2017   Procedure: ESOPHAGOGASTRODUODENOSCOPY (EGD) WITH PROPOFOL;  Surgeon: Danie Binder, MD;  [facility];  Service: Endoscopy;  Laterality: N/A;  . INTRAVASCULAR PRESSURE WIRE/FFR STUDY N/A 04/13/2016   Procedure: Intravascular Pressure Wire/FFR Study;  Surgeon: Nelva Bush, MD;  Location: [facility];  Service: Cardiovascular;  Laterality: N/A;  . LEFT HEART CATH AND CORONARY ANGIOGRAPHY N/A 04/13/2016   Procedure: Left Heart Cath and Coronary Angiography;  Surgeon: Nelva Bush, MD;  Location: [facility];  Service: Cardiovascular;  Laterality: N/A;  . LEFT HEART CATHETERIZATION WITH CORONARY ANGIOGRAM N/A 04/07/2012   Procedure: LEFT HEART CATHETERIZATION WITH CORONARY ANGIOGRAM;  Surgeon: Sherren Mocha, MD;  Location: [facility] C[facility] LAB;  Service: Cardiovascular;  Laterality: N/A;  . PERCUTANEOUS CORONARY STENT INTERVENTION (PCI-S)  04/07/2012   Procedure: PERCUTANEOUS CORONARY STENT INTERVENTION (PCI-S);  Surgeon: Sherren Mocha, MD;  Location: [facility];  Service: Cardiovascular;;  . POLYPECTOMY  09/24/2017   Procedure: POLYPECTOMY;  Surgeon: Danie Binder, MD;  [facility];   Service: Endoscopy;;  hepatic flexure polyp, transverse colon polyps x4  . TEE WITHOUT CARDIOVERSION N/A 04/23/2016   Procedure: TRANSESOPHAGEAL ECHOCARDIOGRAM (TEE);  Surgeon: Gaye Pollack, MD;  Location: [facility];  Service: Open Heart Surgery;  Laterality: N/A;   Past Medical History:  Diagnosis Date  . Anxiety   . Arthritis   . CAD (coronary artery disease) 04/07/2012   a. Inferoposterior STEMI s/p DES-mid LCx. b. s/p CABG 04/2016.  . Diabetes mellitus   . Diabetic neuropathy ([place])   . GERD (gastroesophageal reflux disease)   . High cholesterol   . History of kidney stones   . Hypertension   .  Ischemic cardiomyopathy    a. EF not totally clear - in 04/2016 was 35-40% by cath, 55-60% by echo, 40-45% by TEE all around same time.  . Myocardial infarction ([place]) 2013  . Neuropathy   . Obesity   . Pancreatitis    a. mild by CT 06/2011  . Peptic ulcer disease    a. 06/2009 EGD: multiple gastric and duodenal ulcers.   BP 135/88 Comment: pt reported virtual visit  Ht 5\' 9"  (1.753 m) Comment: pt reported virtual visit  Wt 238 lb (108 kg) Comment: pt reported virtual visit  BMI 35.15 kg/m   Opioid Risk Score:   Fall Risk Score:  `1  Depression screen PHQ 2/9  Depression screen [facility] 2/9 05/01/2018 12/17/2017 12/16/2017 11/26/2017 11/14/2017 10/15/2017 04/05/2017  Decreased Interest 0 0 0 0 0 - 0  Down, Depressed, Hopeless 0 0 0 0 0 1 0  PHQ - 2 Score 0 0 0 0 0 1 0  Altered sleeping - - - - - 3 -  Tired, decreased energy - - - - - 3 -  Change in appetite - - - - - 1 -  Feeling bad or failure about yourself  - - - - - 1 -  Trouble concentrating - - - - - 0 -  Moving slowly or fidgety/restless - - - - - 0 -  Suicidal thoughts - - - - - 0 -  PHQ-9 Score - - - - - 9 -  Difficult doing work/chores - - - - - Somewhat difficult -    Review of Systems  Constitutional: Negative.   HENT: Negative.   Eyes: Negative.   Respiratory: Negative.   Cardiovascular: Negative.   Gastrointestinal:  Negative.   Endocrine: Negative.   Genitourinary: Negative.   Musculoskeletal: Positive for arthralgias.       Leg pain Foot pain Shoulder pain   Skin: Negative.   Allergic/Immunologic: Negative.   Neurological:       Tingling   Hematological: Negative.   Psychiatric/Behavioral: Negative.   All other systems reviewed and are negative.          Assessment & Plan:  1. Right shoulder pain likely rotator cuff tendonitis with associated DJD and AC jt arthritis.  Most recent exam consistent with bicipital tendinitis 2. Meralgia Paresthetica bilaterally likely due to his abdominal girth and diabetes.  3. Cervical whiplash injury which appears to be improving.  4. DM 2 5.  Distal paresthesias and cramping in the lower extremities 6. Right knee pain.  likely osteoarthritis   Plan: 1.Counseled patient extensively on potential treatments for his meralgia paresthetica. -weight loss discussed again (especially for diabetes)--he's working on this.            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    3.Not overly anxious to inject his lateral femoral cutaneous nerve given his diabetes and the chronicity of the problem 4. Patient had nice results with right biceps tendon injection.  Pain is recurreint. Repeat injection at his next visit    needs to maintain HEP 5.Received outpt OT x 1. Continue with HEP 6. Patient also has right knee pain which he would like me to investigate at his next visit in person   -  this was seen and injected by orthopedics before.  -inject at next visit 7.Oxycodone 5mg  prn for breakthrough pain q12 prn #60. RF today  -We will continue the controlled substance monitoring program,  this consists of regular clinic visits, examinations, routine drug screening, pill counts as well as use of [place] Controlled Substance Reporting System. [place] today.     11 minutes of tele-visit time was spent with this patient today. Follow up in 1 months   .

## 2018-05-07 NOTE — Addendum Note (Signed)
Addended by: Alger Simons T on: 05/07/2018 03:35 PM   Modules accepted: Level of Service

## 2018-06-11 ENCOUNTER — Encounter
Payer: BC Managed Care – PPO | Attending: Physical Medicine & Rehabilitation | Admitting: Physical Medicine & Rehabilitation

## 2018-06-11 DIAGNOSIS — I10 Essential (primary) hypertension: Secondary | ICD-10-CM | POA: Insufficient documentation

## 2018-06-11 DIAGNOSIS — I255 Ischemic cardiomyopathy: Secondary | ICD-10-CM | POA: Insufficient documentation

## 2018-06-11 DIAGNOSIS — Z87442 Personal history of urinary calculi: Secondary | ICD-10-CM | POA: Insufficient documentation

## 2018-06-11 DIAGNOSIS — S134XXD Sprain of ligaments of cervical spine, subsequent encounter: Secondary | ICD-10-CM | POA: Insufficient documentation

## 2018-06-11 DIAGNOSIS — K219 Gastro-esophageal reflux disease without esophagitis: Secondary | ICD-10-CM | POA: Insufficient documentation

## 2018-06-11 DIAGNOSIS — I252 Old myocardial infarction: Secondary | ICD-10-CM | POA: Insufficient documentation

## 2018-06-11 DIAGNOSIS — M25511 Pain in right shoulder: Secondary | ICD-10-CM | POA: Insufficient documentation

## 2018-06-11 DIAGNOSIS — M1990 Unspecified osteoarthritis, unspecified site: Secondary | ICD-10-CM | POA: Insufficient documentation

## 2018-06-11 DIAGNOSIS — E7800 Pure hypercholesterolemia, unspecified: Secondary | ICD-10-CM | POA: Insufficient documentation

## 2018-06-11 DIAGNOSIS — Z8249 Family history of ischemic heart disease and other diseases of the circulatory system: Secondary | ICD-10-CM | POA: Insufficient documentation

## 2018-06-11 DIAGNOSIS — Z87891 Personal history of nicotine dependence: Secondary | ICD-10-CM | POA: Insufficient documentation

## 2018-06-11 DIAGNOSIS — Z8711 Personal history of peptic ulcer disease: Secondary | ICD-10-CM | POA: Insufficient documentation

## 2018-06-11 DIAGNOSIS — G5713 Meralgia paresthetica, bilateral lower limbs: Secondary | ICD-10-CM | POA: Insufficient documentation

## 2018-06-11 DIAGNOSIS — F419 Anxiety disorder, unspecified: Secondary | ICD-10-CM | POA: Insufficient documentation

## 2018-06-11 DIAGNOSIS — E669 Obesity, unspecified: Secondary | ICD-10-CM | POA: Insufficient documentation

## 2018-06-11 DIAGNOSIS — Z951 Presence of aortocoronary bypass graft: Secondary | ICD-10-CM | POA: Insufficient documentation

## 2018-06-11 DIAGNOSIS — Z823 Family history of stroke: Secondary | ICD-10-CM | POA: Insufficient documentation

## 2018-06-11 DIAGNOSIS — I2510 Atherosclerotic heart disease of native coronary artery without angina pectoris: Secondary | ICD-10-CM | POA: Insufficient documentation

## 2018-06-11 DIAGNOSIS — Z6835 Body mass index (BMI) 35.0-35.9, adult: Secondary | ICD-10-CM | POA: Insufficient documentation

## 2018-06-11 DIAGNOSIS — E1142 Type 2 diabetes mellitus with diabetic polyneuropathy: Secondary | ICD-10-CM | POA: Insufficient documentation

## 2018-06-18 DIAGNOSIS — H0279 Other degenerative disorders of eyelid and periocular area: Secondary | ICD-10-CM | POA: Diagnosis not present

## 2018-06-18 DIAGNOSIS — H02423 Myogenic ptosis of bilateral eyelids: Secondary | ICD-10-CM | POA: Diagnosis not present

## 2018-06-18 DIAGNOSIS — H02831 Dermatochalasis of right upper eyelid: Secondary | ICD-10-CM | POA: Diagnosis not present

## 2018-06-18 DIAGNOSIS — H02834 Dermatochalasis of left upper eyelid: Secondary | ICD-10-CM | POA: Diagnosis not present

## 2018-06-18 DIAGNOSIS — H02413 Mechanical ptosis of bilateral eyelids: Secondary | ICD-10-CM | POA: Diagnosis not present

## 2018-06-20 DIAGNOSIS — H53482 Generalized contraction of visual field, left eye: Secondary | ICD-10-CM | POA: Diagnosis not present

## 2018-06-20 DIAGNOSIS — H53483 Generalized contraction of visual field, bilateral: Secondary | ICD-10-CM | POA: Diagnosis not present

## 2018-06-20 DIAGNOSIS — H53481 Generalized contraction of visual field, right eye: Secondary | ICD-10-CM | POA: Diagnosis not present

## 2018-07-11 ENCOUNTER — Encounter: Payer: Self-pay | Admitting: Family Medicine

## 2018-08-07 ENCOUNTER — Other Ambulatory Visit: Payer: Self-pay | Admitting: Family Medicine

## 2018-08-13 ENCOUNTER — Encounter: Payer: Self-pay | Admitting: Family Medicine

## 2018-08-13 ENCOUNTER — Ambulatory Visit (INDEPENDENT_AMBULATORY_CARE_PROVIDER_SITE_OTHER): Payer: BLUE CROSS/BLUE SHIELD | Admitting: Family Medicine

## 2018-08-13 DIAGNOSIS — E1142 Type 2 diabetes mellitus with diabetic polyneuropathy: Secondary | ICD-10-CM

## 2018-08-13 DIAGNOSIS — I10 Essential (primary) hypertension: Secondary | ICD-10-CM

## 2018-08-13 MED ORDER — ESCITALOPRAM OXALATE 20 MG PO TABS: 20.0000 mg | ORAL_TABLET | Freq: Every day | ORAL | 1 refills | Status: DC

## 2018-08-13 NOTE — Progress Notes (Addendum)
Subjective:    Patient ID: Juan Ray, male    DOB: July 17, 1960, 58 y.o.   MRN: [id number]   HPI: Juan Ray is a 58 y.o. male presenting for presents forFollow-up of diabetes. Patient checks blood sugar at home.   120 fasting and 170 postprandial Patient denies symptoms such as polyuria, polydipsia, excessive hunger, nausea No significant hypoglycemic spells noted. Medications reviewed. Pt reports taking them regularly without complication/adverse reaction being reported today.  Checking feet daily. Last eye appt was recent, surgery next month  presents for  follow-up of hypertension. Patient has no history of headache chest pain or shortness of breath or recent cough. Patient also denies symptoms of TIA such as focal numbness or weakness. Patient denies side effects from medication. States taking it regularly.  Wt. 228. Losing by cutting back on fatty foods. Hard to eat right traveling - but working on it.   Marital - stress - wife just moved back in. Lexpro not successful at helping him control his temper.  Depression screen [facility] 2/9 05/01/2018 12/17/2017 12/16/2017 11/26/2017 11/14/2017  Decreased Interest 0 0 0 0 0  Down, Depressed, Hopeless 0 0 0 0 0  PHQ - 2 Score 0 0 0 0 0  Altered sleeping - - - - -  Tired, decreased energy - - - - -  Change in appetite - - - - -  Feeling bad or failure about yourself  - - - - -  Trouble concentrating - - - - -  Moving slowly or fidgety/restless - - - - -  Suicidal thoughts - - - - -  PHQ-9 Score - - - - -  Difficult doing work/chores - - - - -     Relevant past medical, surgical, family and social history reviewed and updated as indicated.  Interim medical history since our last visit reviewed. Allergies and medications reviewed and updated.  ROS:  Review of Systems  Constitutional: Negative.   HENT: Negative.   Eyes: Negative for visual disturbance.  Respiratory: Negative for cough and shortness of breath.    Cardiovascular: Negative for chest pain and leg swelling.  Gastrointestinal: Negative for abdominal pain, diarrhea, nausea and vomiting.  Genitourinary: Negative for difficulty urinating.  Musculoskeletal: Negative for arthralgias and myalgias.  Skin: Negative for rash.  Neurological: Negative for headaches.  Psychiatric/Behavioral: Negative for sleep disturbance.     Social History   Tobacco Use  Smoking Status Former Smoker  . Packs/day: 1.00  . Years: 20.00  . Pack years: 20.00  . Types: Cigarettes  . Quit date: 2000  . Years since quitting: 20.6  Smokeless Tobacco Former Systems developer  . Quit date: 01/01/1998       Objective:     Wt Readings from Last 3 Encounters:  05/07/18 238 lb (108 kg)  05/01/18 245 lb (111.1 kg)  03/26/18 242 lb (109.8 kg)     Exam deferred. Pt. Harboring due to COVID 19. Phone visit performed.   Assessment & Plan:   1. Essential hypertension   2. Diabetic peripheral neuropathy ([place])     Meds ordered this encounter  Medications  . escitalopram (LEXAPRO) 20 MG tablet    Sig: Take 1 tablet (20 mg total) by mouth daily.    Dispense:  90 tablet    Refill:  1    No orders of the defined types were placed in this encounter.     Diagnoses and all orders for this visit:  Essential hypertension  Diabetic peripheral neuropathy (  [place])  Other orders -     escitalopram (LEXAPRO) 20 MG tablet; Take 1 tablet (20 mg total) by mouth daily.    Virtual Visit via telephone Note  I discussed the limitations, risks, security and privacy concerns of performing an evaluation and management service by telephone and the availability of in person appointments. The patient was identified with two identifiers. Pt.expressed understanding and agreed to proceed. Pt. Is at home. Dr. Livia Snellen is in his office.  Follow Up Instructions:   I discussed the assessment and treatment plan with the patient. The patient was provided an opportunity to ask questions and all  were answered. The patient agreed with the plan and demonstrated an understanding of the instructions.   The patient was advised to call back or seek an in-person evaluation if the symptoms worsen or if the condition fails to improve as anticipated.   Total minutes including chart review and phone contact time: 35   Follow up plan: Return in about 3 months (around 11/13/2018).  Claretta Fraise, MD [place]

## 2018-08-15 ENCOUNTER — Telehealth: Payer: Self-pay | Admitting: Physical Medicine & Rehabilitation

## 2018-08-15 NOTE — Telephone Encounter (Signed)
Please see message below and advise if refilling the pain medication is okay?

## 2018-08-15 NOTE — Telephone Encounter (Signed)
PT called wanting to reschedule injection appt he missed and needing medication refills. He has been rescheduled for 09/24/18 which one the first available appointment. He would like to know if he can get refills on medications.

## 2018-08-15 NOTE — Telephone Encounter (Signed)
He no-showed for appt with me. Will need to be seen for meds. Hasn't been seen for a couple months

## 2018-08-18 NOTE — Telephone Encounter (Signed)
Please schedule for an appointment, no refills until seen per message below.

## 2018-08-19 ENCOUNTER — Other Ambulatory Visit: Payer: Self-pay | Admitting: Family Medicine

## 2018-08-25 ENCOUNTER — Other Ambulatory Visit: Payer: Self-pay | Admitting: Family Medicine

## 2018-08-25 ENCOUNTER — Other Ambulatory Visit: Payer: Self-pay

## 2018-08-25 ENCOUNTER — Emergency Department (HOSPITAL_COMMUNITY): Payer: BC Managed Care – PPO

## 2018-08-25 ENCOUNTER — Encounter (HOSPITAL_COMMUNITY): Payer: Self-pay | Admitting: Emergency Medicine

## 2018-08-25 ENCOUNTER — Observation Stay (HOSPITAL_COMMUNITY)
Admission: EM | Admit: 2018-08-25 | Discharge: 2018-08-26 | Disposition: A | Discharge status: Home / Self Care | Payer: BC Managed Care – PPO | Attending: Internal Medicine | Admitting: Internal Medicine

## 2018-08-25 DIAGNOSIS — I2510 Atherosclerotic heart disease of native coronary artery without angina pectoris: Secondary | ICD-10-CM | POA: Diagnosis present

## 2018-08-25 DIAGNOSIS — Z20828 Contact with and (suspected) exposure to other viral communicable diseases: Secondary | ICD-10-CM | POA: Insufficient documentation

## 2018-08-25 DIAGNOSIS — R0789 Other chest pain: Principal | ICD-10-CM | POA: Insufficient documentation

## 2018-08-25 DIAGNOSIS — I5032 Chronic diastolic (congestive) heart failure: Secondary | ICD-10-CM | POA: Diagnosis present

## 2018-08-25 DIAGNOSIS — Z951 Presence of aortocoronary bypass graft: Secondary | ICD-10-CM | POA: Diagnosis not present

## 2018-08-25 DIAGNOSIS — Z79899 Other long term (current) drug therapy: Secondary | ICD-10-CM | POA: Insufficient documentation

## 2018-08-25 DIAGNOSIS — R079 Chest pain, unspecified: Secondary | ICD-10-CM

## 2018-08-25 DIAGNOSIS — I110 Hypertensive heart disease with heart failure: Secondary | ICD-10-CM | POA: Diagnosis not present

## 2018-08-25 DIAGNOSIS — Z7984 Long term (current) use of oral hypoglycemic drugs: Secondary | ICD-10-CM | POA: Insufficient documentation

## 2018-08-25 DIAGNOSIS — E1165 Type 2 diabetes mellitus with hyperglycemia: Secondary | ICD-10-CM

## 2018-08-25 DIAGNOSIS — Z7982 Long term (current) use of aspirin: Secondary | ICD-10-CM | POA: Diagnosis not present

## 2018-08-25 DIAGNOSIS — I252 Old myocardial infarction: Secondary | ICD-10-CM | POA: Insufficient documentation

## 2018-08-25 DIAGNOSIS — E119 Type 2 diabetes mellitus without complications: Secondary | ICD-10-CM

## 2018-08-25 DIAGNOSIS — E785 Hyperlipidemia, unspecified: Secondary | ICD-10-CM | POA: Diagnosis present

## 2018-08-25 DIAGNOSIS — R9431 Abnormal electrocardiogram [ECG] [EKG]: Secondary | ICD-10-CM | POA: Diagnosis not present

## 2018-08-25 DIAGNOSIS — E782 Mixed hyperlipidemia: Secondary | ICD-10-CM | POA: Diagnosis present

## 2018-08-25 DIAGNOSIS — R0602 Shortness of breath: Secondary | ICD-10-CM | POA: Diagnosis not present

## 2018-08-25 DIAGNOSIS — I10 Essential (primary) hypertension: Secondary | ICD-10-CM | POA: Diagnosis present

## 2018-08-25 DIAGNOSIS — Z87891 Personal history of nicotine dependence: Secondary | ICD-10-CM | POA: Diagnosis not present

## 2018-08-25 LAB — COMPREHENSIVE METABOLIC PANEL
ALT: 19 U/L (ref 0–44)
AST: 18 U/L (ref 15–41)
Albumin: 3.7 g/dL (ref 3.5–5.0)
Alkaline Phosphatase: 68 U/L (ref 38–126)
Anion gap: 8 (ref 5–15)
BUN: 20 mg/dL (ref 6–20)
CO2: 25 mmol/L (ref 22–32)
Calcium: 9.1 mg/dL (ref 8.9–10.3)
Chloride: 105 mmol/L (ref 98–111)
Creatinine, Ser: 1.1 mg/dL (ref 0.61–1.24)
GFR calc Af Amer: >60 mL/min (ref >60)
GFR calc non Af Amer: >60 mL/min (ref >60)
Glucose, Bld: 145 mg/dL — ABNORMAL HIGH (ref 70–99)
Potassium: 4 mmol/L (ref 3.5–5.1)
Sodium: 138 mmol/L (ref 135–145)
Total Bilirubin: 0.4 mg/dL (ref 0.3–1.2)
Total Protein: 6.6 g/dL (ref 6.5–8.1)

## 2018-08-25 LAB — CBC
HCT: 44.4 % (ref 39.0–52.0)
Hemoglobin: 15 g/dL (ref 13.0–17.0)
MCH: 32.1 pg (ref 26.0–34.0)
MCHC: 33.8 g/dL (ref 30.0–36.0)
MCV: 95.1 fL (ref 80.0–100.0)
Platelets: 237 10*3/uL (ref 150–400)
RBC: 4.67 MIL/uL (ref 4.22–5.81)
RDW: 12.9 % (ref 11.5–15.5)
WBC: 9.4 10*3/uL (ref 4.0–10.5)
nRBC: 0 % (ref 0.0–0.2)

## 2018-08-25 LAB — TROPONIN I (HIGH SENSITIVITY)
Troponin I (High Sensitivity): 8 ng/L (ref <18)
Troponin I (High Sensitivity): 8 ng/L (ref <18)

## 2018-08-25 LAB — SARS CORONAVIRUS 2 BY RT PCR (HOSPITAL ORDER, PERFORMED IN ~~LOC~~ HOSPITAL LAB): SARS Coronavirus 2: NEGATIVE

## 2018-08-25 LAB — GLUCOSE, CAPILLARY: Glucose-Capillary: 130 mg/dL — ABNORMAL HIGH (ref 70–99)

## 2018-08-25 MED ORDER — ACETAMINOPHEN 650 MG RE SUPP: 650.0000 mg | Freq: Four times a day (QID) | RECTAL | Status: DC | PRN

## 2018-08-25 MED ORDER — SODIUM CHLORIDE 0.9 % IV SOLN: INTRAVENOUS | Status: DC

## 2018-08-25 MED ORDER — NITROGLYCERIN 0.4 MG SL SUBL: 0.4000 mg | SUBLINGUAL_TABLET | SUBLINGUAL | 99 refills | Status: DC | PRN

## 2018-08-25 MED ORDER — ESCITALOPRAM OXALATE 10 MG PO TABS
20.0000 mg | ORAL_TABLET | Freq: Every day | ORAL | Status: DC
  Administered 2018-08-25: 20 mg via ORAL
  Filled 2018-08-25: qty 2

## 2018-08-25 MED ORDER — ATORVASTATIN CALCIUM 40 MG PO TABS: 40.0000 mg | ORAL_TABLET | Freq: Every evening | ORAL | Status: DC

## 2018-08-25 MED ORDER — FENTANYL CITRATE (PF) 100 MCG/2ML IJ SOLN
50.0000 ug | Freq: Once | INTRAMUSCULAR | Status: AC
  Administered 2018-08-25: 50 ug via INTRAVENOUS
  Filled 2018-08-25: qty 2

## 2018-08-25 MED ORDER — OXYCODONE HCL 5 MG PO TABS: 5.0000 mg | ORAL_TABLET | Freq: Two times a day (BID) | ORAL | Status: DC | PRN

## 2018-08-25 MED ORDER — ENOXAPARIN SODIUM 60 MG/0.6ML ~~LOC~~ SOLN
0.5000 mg/kg | SUBCUTANEOUS | Status: DC
  Administered 2018-08-26: 55 mg via SUBCUTANEOUS
  Filled 2018-08-25: qty 0.6

## 2018-08-25 MED ORDER — ASPIRIN 81 MG PO CHEW
324.0000 mg | CHEWABLE_TABLET | Freq: Once | ORAL | Status: AC
  Administered 2018-08-25: 324 mg via ORAL
  Filled 2018-08-25: qty 4

## 2018-08-25 MED ORDER — ACETAMINOPHEN 325 MG PO TABS: 650.0000 mg | ORAL_TABLET | Freq: Four times a day (QID) | ORAL | Status: DC | PRN

## 2018-08-25 MED ORDER — METOPROLOL TARTRATE 25 MG PO TABS
25.0000 mg | ORAL_TABLET | Freq: Two times a day (BID) | ORAL | Status: DC
  Administered 2018-08-25 – 2018-08-26 (×2): 25 mg via ORAL
  Filled 2018-08-25 (×2): qty 1

## 2018-08-25 MED ORDER — GABAPENTIN 300 MG PO CAPS
600.0000 mg | ORAL_CAPSULE | Freq: Three times a day (TID) | ORAL | Status: DC
  Administered 2018-08-25 – 2018-08-26 (×2): 600 mg via ORAL
  Filled 2018-08-25 (×2): qty 2

## 2018-08-25 MED ORDER — ASPIRIN EC 81 MG PO TBEC
81.0000 mg | DELAYED_RELEASE_TABLET | Freq: Every day | ORAL | Status: DC
  Administered 2018-08-26: 81 mg via ORAL
  Filled 2018-08-25 (×5): qty 1

## 2018-08-25 MED ORDER — LISINOPRIL 5 MG PO TABS
2.5000 mg | ORAL_TABLET | Freq: Every day | ORAL | Status: DC
  Administered 2018-08-26: 08:00:00 2.5 mg via ORAL
  Filled 2018-08-25: qty 1

## 2018-08-25 MED ORDER — PANTOPRAZOLE SODIUM 40 MG PO TBEC
40.0000 mg | DELAYED_RELEASE_TABLET | Freq: Every day | ORAL | Status: DC
  Administered 2018-08-26: 08:00:00 40 mg via ORAL
  Filled 2018-08-25: qty 1

## 2018-08-25 MED ORDER — INSULIN ASPART 100 UNIT/ML ~~LOC~~ SOLN
0.0000 [IU] | Freq: Three times a day (TID) | SUBCUTANEOUS | Status: DC
  Administered 2018-08-26: 1 [IU] via SUBCUTANEOUS

## 2018-08-25 NOTE — Telephone Encounter (Signed)
What is the name of the medication? Nitroglycerin  Have you contacted your pharmacy to request a [facility]ill[facility]   Which pharmacy would you like this sent to? yes   Patient notified that their request is being sent to the clinical staff for review and that they should receive a call once it is complete. If they do not receive a call within 24 hours they can check with their pharmacy or our office.

## 2018-08-25 NOTE — H&P (Signed)
TRH H&P    Patient Demographics:    Juan Ray, is a 58 y.o. male  MRN: [id number]  DOB - 12-08-1960  Admit Date - 08/25/2018  Referring MD/NP/PA: Dr. Lacinda Axon  Outpatient Primary MD for the patient is Claretta Fraise, MD  Patient coming from: Home  Chief complaint-chest pain   HPI:    Juan Ray  is a 58 y.o. male, with history of CAD status post CABG x4, diabetes mellitus type 2, hypertension came to the ED with complaints of chest pain which started around 9 AM this morning.  Patient says that he was just sitting and he developed chest pain which felt like pressure and burning lasted for almost whole day.  Chest pain has eased up in the ED right now.  He also had some shortness of breath and dizziness.  Denies nausea or vomiting.  Pain felt like pressure.  Patient reported taking Viagra last evening.. Denies abdominal pain or dysuria. No previous history of stroke or seizures. In the ED initial troponin x2 were 8 and 8. EKG was unremarkable.  SARS-CoV-2 test obtained.    Review of systems:    In addition to the HPI above,    All other systems reviewed and are negative.    Past History of the following :    Past Medical History:  Diagnosis Date  . Anxiety   . Arthritis   . CAD (coronary artery disease) 04/07/2012   a. Inferoposterior STEMI s/p DES-mid LCx. b. s/p CABG 04/2016.  . Diabetes mellitus   . Diabetic neuropathy ([place])   . GERD (gastroesophageal reflux disease)   . High cholesterol   . History of kidney stones   . Hypertension   . Ischemic cardiomyopathy    a. EF not totally clear - in 04/2016 was 35-40% by cath, 55-60% by echo, 40-45% by TEE all around same time.  . Myocardial infarction ([place]) 2013  . Neuropathy   . Obesity   . Pancreatitis    a. mild by CT 06/2011  . Peptic ulcer disease    a. 06/2009 EGD: multiple gastric and duodenal ulcers.      Past Surgical History:   Procedure Laterality Date  . BIOPSY  09/24/2017   Procedure: BIOPSY;  Surgeon: Danie Binder, MD;  [facility];  Service: Endoscopy;;  gastric bx's  . COLONOSCOPY WITH PROPOFOL N/A 09/24/2017   Procedure: COLONOSCOPY WITH PROPOFOL;  Surgeon: Danie Binder, MD;  [facility];  Service: Endoscopy;  Laterality: N/A;  7:30am  . CORONARY ANGIOPLASTY WITH STENT PLACEMENT  04/07/2012   70% prox LAD, 70-80% ostial diagonal, 70% mid-distal LAD, 80% prox OM1, mid LCx totally occluded just distal to OM1 s/p DES, occluded RCA; severe inferior HK, LVEF 45%  . CORONARY ARTERY BYPASS GRAFT N/A 04/23/2016   Procedure: CORONARY ARTERY BYPASS GRAFTING (CABG) x 4;  Surgeon: Gaye Pollack, MD;  Location: [facility];  Service: Open Heart Surgery;  Laterality: N/A;  . ENDOVEIN HARVEST OF GREATER SAPHENOUS VEIN Right 04/23/2016   Procedure: ENDOVEIN HARVEST  OF GREATER SAPHENOUS VEIN;  Surgeon: Gaye Pollack, MD;  Location: [facility];  Service: Open Heart Surgery;  Laterality: Right;  . ESOPHAGOGASTRODUODENOSCOPY  06/18/2011   multiple ulcers in proximal stomach with stigmata of bleeding s/p biopsy, multiple superficial ulcers in duodenal bulb. normal ampulla and second portion of duodenum  . ESOPHAGOGASTRODUODENOSCOPY  2013  . ESOPHAGOGASTRODUODENOSCOPY (EGD) WITH PROPOFOL N/A 09/24/2017   Procedure: ESOPHAGOGASTRODUODENOSCOPY (EGD) WITH PROPOFOL;  Surgeon: Danie Binder, MD;  [facility];  Service: Endoscopy;  Laterality: N/A;  . INTRAVASCULAR PRESSURE WIRE/FFR STUDY N/A 04/13/2016   Procedure: Intravascular Pressure Wire/FFR Study;  Surgeon: Nelva Bush, MD;  Location: [place] [facility];  Service: Cardiovascular;  Laterality: N/A;  . LEFT HEART CATH AND CORONARY ANGIOGRAPHY N/A 04/13/2016   Procedure: Left Heart Cath and Coronary Angiography;  Surgeon: Nelva Bush, MD;  Location: [place] [facility];  Service: Cardiovascular;  Laterality: N/A;  . LEFT HEART CATHETERIZATION WITH  CORONARY ANGIOGRAM N/A 04/07/2012   Procedure: LEFT HEART CATHETERIZATION WITH CORONARY ANGIOGRAM;  Surgeon: Sherren Mocha, MD;  Location: [facility] CATH LAB;  Service: Cardiovascular;  Laterality: N/A;  . PERCUTANEOUS CORONARY STENT INTERVENTION (PCI-S)  04/07/2012   Procedure: PERCUTANEOUS CORONARY STENT INTERVENTION (PCI-S);  Surgeon: Sherren Mocha, MD;  Location: [facility] CATH LAB;  Service: Cardiovascular;;  . POLYPECTOMY  09/24/2017   Procedure: POLYPECTOMY;  Surgeon: Danie Binder, MD;  [facility];  Service: Endoscopy;;  hepatic flexure polyp, transverse colon polyps x4  . TEE WITHOUT CARDIOVERSION N/A 04/23/2016   Procedure: TRANSESOPHAGEAL ECHOCARDIOGRAM (TEE);  Surgeon: Gaye Pollack, MD;  Location: [place];  Service: Open Heart Surgery;  Laterality: N/A;      Social History:      Social History   Tobacco Use  . Smoking status: Former Smoker    Packs/day: 1.00    Years: 20.00    Pack years: 20.00    Types: Cigarettes    Quit date: 2000    Years since quitting: 20.6  . Smokeless tobacco: Former Systems developer    Quit date: 01/01/1998  Substance Use Topics  . Alcohol use: No       Family History :     Family History  Problem Relation Age of Onset  . Heart attack Father        MI x 2 in late 50's, currently 73's  . CVA Sister        late 32s  . CAD Maternal Uncle        MI at 46  . Colon cancer Neg Hx   . Colon polyps Neg Hx       Home Medications:   Prior to Admission medications   Medication Sig Start Date End Date Taking? Authorizing Provider  aspirin EC 81 MG EC tablet Take 1 tablet (81 mg total) by mouth daily. 04/09/12  Yes Arguello, Roger A, PA-C  atorvastatin (LIPITOR) 40 MG tablet TAKE ONE (1) TABLET EACH DAY Patient taking differently: Take 40 mg by mouth every evening.  08/08/18  Yes Claretta Fraise, MD  diphenoxylate-atropine (LOMOTIL) 2.5-0.025 MG tablet Take 2 tablets by mouth 4 (four) times daily as needed for diarrhea or loose stools. 12/17/17  Yes Stacks,  Cletus Gash, MD  escitalopram (LEXAPRO) 20 MG tablet Take 1 tablet (20 mg total) by mouth daily. Patient taking differently: Take 20 mg by mouth at bedtime.  08/13/18  Yes Claretta Fraise, MD  gabapentin (NEURONTIN) 600 MG tablet Take 1 tablet (600 mg total) by mouth 3 (  three) times daily. 05/01/18  Yes Stacks, Cletus Gash, MD  JARDIANCE 25 MG TABS tablet TAKE ONE (1) TABLET EACH DAY Patient taking differently: Take 25 mg by mouth daily.  08/19/18  Yes Stacks, Cletus Gash, MD  lisinopril (ZESTRIL) 2.5 MG tablet Take 1 tablet (2.5 mg total) by mouth daily. 05/01/18  Yes Claretta Fraise, MD  metFORMIN (GLUCOPHAGE) 1000 MG tablet Take 1 tablet (1,000 mg total) by mouth 2 (two) times daily. 05/01/18  Yes Stacks, Cletus Gash, MD  metoprolol tartrate (LOPRESSOR) 25 MG tablet TAKE 1 TABLET BY MOUTH TWICE DAILY (PATIENT  NEEDS  TO  SCHEDULE  APPOINTMENT  TO  RECEIVE  FURTHER  REFILLS) 05/01/18  Yes Claretta Fraise, MD  nitroGLYCERIN (NITROSTAT) 0.4 MG SL tablet Place 1 tablet (0.4 mg total) under the tongue every 5 (five) minutes as needed for chest pain. X 3 doses 08/25/18  Yes Stacks, Cletus Gash, MD  oxyCODONE (OXY IR/ROXICODONE) 5 MG immediate release tablet Take 1 tablet (5 mg total) by mouth every 12 (twelve) hours as needed for severe pain. Chronic Pain. Dx: G89.4, G62.9 05/07/18  Yes Meredith Staggers, MD  pantoprazole (PROTONIX) 40 MG tablet Take 1 tablet (40 mg total) by mouth daily. 05/01/18  Yes Claretta Fraise, MD  pioglitazone (ACTOS) 30 MG tablet Take 1 tablet (30 mg total) by mouth daily. ) Patient taking differently: Take 30 mg by mouth every evening.  05/01/18  Yes Claretta Fraise, MD  sildenafil (REVATIO) 20 MG tablet Take 2-5 pills at once, orally, with each sexual encounter Patient taking differently: Take 20-100 mg by mouth daily as needed. with each sexual encounter 05/01/18  Yes Stacks, Cletus Gash, MD  furosemide (LASIX) 20 MG tablet Take 1 tablet (20 mg total) by mouth daily as needed for fluid. 05/01/18   Claretta Fraise, MD      Allergies:     Allergies  Allergen Reactions  . No Known Allergies      Physical Exam:   Vitals  Blood pressure 113/71, pulse 66, temperature 98 F (36.7 C), temperature source Oral, resp. rate 12, height 5\' 9"  (1.753 m), weight 105.2 kg, SpO2 98 %.  1.  General: Appears in no acute distress  2. Psychiatric: Alert, oriented x3, intact insight and judgment  3. Neurologic: Cranial nerves II to XII grossly intact, motor strength 5/5 in all extremities  4. HEENMT:  Atraumatic normocephalic, extraocular muscles are intact  5. Respiratory : Clear to auscultation bilaterally, no wheezing or crackles  6. Cardiovascular : S1-S2, regular, no murmur auscultated  7. Gastrointestinal:  Abdomen is soft, nontender, no organomegaly  8. Skin:  No rashes noted       Data Review:    CBC Recent Labs  Lab 08/25/18 1906  WBC 9.4  HGB 15.0  HCT 44.4  PLT 237  MCV 95.1  MCH 32.1  MCHC 33.8  RDW 12.9   ------------------------------------------------------------------------------------------------------------------  Results for orders placed or performed during the hospital encounter of 08/25/18 (from the past 48 hour(s))  CBC     Status: None   Collection Time: 08/25/18  7:06 PM  Result Value Ref Range   WBC 9.4 4.0 - 10.5 K/uL   RBC 4.67 4.22 - 5.81 MIL/uL   Hemoglobin 15.0 13.0 - 17.0 g/dL   HCT 44.4 39.0 - 52.0 %   MCV 95.1 80.0 - 100.0 fL   MCH 32.1 26.0 - 34.0 pg   MCHC 33.8 30.0 - 36.0 g/dL   RDW 12.9 11.5 - 15.5 %   Platelets 237 150 -  400 K/uL   nRBC 0.0 0.0 - 0.2 %    Comment: Performed at [facility], [address] [place], [place] [address]  Troponin I (High Sensitivity)     Status: None   Collection Time: 08/25/18  7:06 PM  Result Value Ref Range   Troponin I (High Sensitivity) 8 <18 ng/L    Comment: (NOTE) Elevated high sensitivity troponin I (hsTnI) values and significant  changes across serial measurements may suggest ACS but many other   chronic and acute conditions are known to elevate hsTnI results.  Refer to the "Links" section for chest pain algorithms and additional  guidance. Performed at [facility], [address], [place], [place] [address]   Comprehensive metabolic panel     Status: Abnormal   Collection Time: 08/25/18  7:06 PM  Result Value Ref Range   Sodium 138 135 - 145 mmol/L   Potassium 4.0 3.5 - 5.1 mmol/L   Chloride 105 98 - 111 mmol/L   CO2 25 22 - 32 mmol/L   Glucose, Bld 145 (H) 70 - 99 mg/dL   BUN 20 6 - 20 mg/dL   Creatinine, Ser 1.10 0.61 - 1.24 mg/dL   Calcium 9.1 8.9 - 10.3 mg/dL   Total Protein 6.6 6.5 - 8.1 g/dL   Albumin 3.7 3.5 - 5.0 g/dL   AST 18 15 - 41 U/L   ALT 19 0 - 44 U/L   Alkaline Phosphatase 68 38 - 126 U/L   Total Bilirubin 0.4 0.3 - 1.2 mg/dL   GFR calc non Af Amer >60 >60 mL/min   GFR calc Af Amer >60 >60 mL/min   Anion gap 8 5 - 15    Comment: Performed at [facility], [address] [place], [place] [address]  Troponin I (High Sensitivity)     Status: None   Collection Time: 08/25/18  9:05 PM  Result Value Ref Range   Troponin I (High Sensitivity) 8 <18 ng/L    Comment: (NOTE) Elevated high sensitivity troponin I (hsTnI) values and significant  changes across serial measurements may suggest ACS but many other  chronic and acute conditions are known to elevate hsTnI results.  Refer to the "Links" section for chest pain algorithms and additional  guidance. Performed at [facility], [address], [place], [place] [address]     Chemistries  Recent Labs  Lab 08/25/18 1906  NA 138  K 4.0  CL 105  CO2 25  GLUCOSE 145*  BUN 20  CREATININE 1.10  CALCIUM 9.1  AST 18  ALT 19  ALKPHOS 68  BILITOT 0.4   ------------------------------------------------------------------------------------------------------------------  ------------------------------------------------------------------------------------------------------------------ GFR: Estimated  Creatinine Clearance: 86.4 mL/min (by C-G formula based on SCr of 1.1 mg/dL). Liver Function Tests: Recent Labs  Lab 08/25/18 1906  AST 18  ALT 19  ALKPHOS 68  BILITOT 0.4  PROT 6.6  ALBUMIN 3.7    --------------------------------------------------------------------------------------------------------------- Urine analysis:    Component Value Date/Time   COLORURINE YELLOW 04/19/2016 1110   APPEARANCEUR CLEAR 04/19/2016 1110   LABSPEC 1.027 04/19/2016 1110   PHURINE 5.0 04/19/2016 1110   GLUCOSEU 50 (A) 04/19/2016 1110   HGBUR NEGATIVE 04/19/2016 1110   BILIRUBINUR NEGATIVE 04/19/2016 1110   KETONESUR NEGATIVE 04/19/2016 1110   PROTEINUR NEGATIVE 04/19/2016 1110   UROBILINOGEN 0.2 06/18/2011 0242   NITRITE NEGATIVE 04/19/2016 1110   LEUKOCYTESUR NEGATIVE 04/19/2016 1110      Imaging Results:    Dg Chest 2 View  Result Date: 08/25/2018 CLINICAL DATA:  Chest pain, shortness of breath, dizziness. EXAM: CHEST - 2 VIEW COMPARISON:  Radiograph May 30, 2016 FINDINGS: Postsurgical changes related to prior CABG including intact and aligned sternotomy wires and multiple surgical clips projecting over the mediastinum. Hypoventilatory changes with central vascular crowding. No consolidation, features of edema, pneumothorax, or effusion. Pulmonary vascularity is normally distributed. The cardiomediastinal contours are unremarkable. No acute osseous or soft tissue abnormality. IMPRESSION: Atelectasis, otherwise no acute cardiopulmonary abnormality. Electronically Signed   By: Lovena Le M.D.   On: 08/25/2018 19:27    My personal review of EKG: Normal sinus rhythm, left anterior fascicular block   Assessment & Plan:    Active Problems:   Chest pain   1. Chest pain-placed under observation to rule out ACS.  Initial troponin unremarkable.  Will cycle troponin x2.  Chest pain has resolved at this time.  Continue to monitor.  2. CAD status post CABG x4 vessel-continue aspirin, Lipitor,  metoprolol.  3. Diabetes mellitus type 2-we will initiate sliding scale insulin with NovoLog.  We will hold oral hypoglycemic agents.   DVT Prophylaxis-   Lovenox   AM Labs Ordered, also please review Full Orders  Family Communication: Admission, patients condition and plan of care including tests being ordered have been discussed with the patient  who indicate understanding and agree with the plan and Code Status.  Code Status: Full code  Admission status: Observation: Based on patients clinical presentation and evaluation of above clinical data, I have made determination that patient may need less than 2 midnight stay in the hospital  Time spent in minutes : 60 minutes   Oswald Hillock M.D on 08/25/2018 at 10:33 PM

## 2018-08-25 NOTE — ED Notes (Signed)
Pt back from X-ray.  

## 2018-08-25 NOTE — ED Provider Notes (Signed)
[facility] EMERGENCY DEPARTMENT Provider Note   CSN: [id number] Arrival date & time: 08/25/18  1740     History   Chief Complaint Chief Complaint  Patient presents with  . Chest Pain    HPI Carder Below is a 58 y.o. male with history of CAD with CABG x4, diabetes, hypertension who presents with chest pain, shortness of breath, dizziness, diaphoresis that occurred earlier today while patient was fishing.  He describes the pain as a heavy pressure.  It worsens with exertion and taking a deep breath.  It does not radiate.  He reports it feels similar to his last heart attack.  No medications taken prior to arrival.  Patient reports some pain in his epigastrium, but mostly pain is in his central chest.  He denies any nausea or vomiting.  He reports taking a Viagra last evening.  Patient denies any recent long trips, surgeries, known cancer, new leg pain or swelling.     HPI  Past Medical History:  Diagnosis Date  . Anxiety   . Arthritis   . CAD (coronary artery disease) 04/07/2012   a. Inferoposterior STEMI s/p DES-mid LCx. b. s/p CABG 04/2016.  . Diabetes mellitus   . Diabetic neuropathy ([place])   . GERD (gastroesophageal reflux disease)   . High cholesterol   . History of kidney stones   . Hypertension   . Ischemic cardiomyopathy    a. EF not totally clear - in 04/2016 was 35-40% by cath, 55-60% by echo, 40-45% by TEE all around same time.  . Myocardial infarction ([place]) 2013  . Neuropathy   . Obesity   . Pancreatitis    a. mild by CT 06/2011  . Peptic ulcer disease    a. 06/2009 EGD: multiple gastric and duodenal ulcers.    Patient Active Problem List   Diagnosis Date Noted  . Primary osteoarthritis of right knee 05/07/2018  . Biceps tendonitis on right 01/21/2018  . Chronic pain syndrome 01/21/2018  . Meralgia paresthetica of right side 12/16/2017  . Meralgia paresthetica of left side 12/16/2017  . Meralgia paresthetica of both lower extremities 12/16/2017  . Rotator  cuff syndrome of right shoulder 12/16/2017  . Diabetic peripheral neuropathy ([place]) 10/31/2017  . Impingement syndrome of right shoulder region 10/31/2017  . Knee pain 10/30/2017  . Encounter for screening colonoscopy 07/03/2017  . Chronic diastolic CHF (congestive heart failure) ([place]) 01/22/2017  . S/P CABG x 4 04/23/2016  . Abnormal stress test 04/13/2016  . Chest pain 03/11/2016  . Ischemic cardiomyopathy 07/02/2013  . Midsternal chest pain 07/11/2012  . ST elevation myocardial infarction (STEMI) of inferoposterior wall ([place]) 04/09/2012  . CAD (coronary artery disease), native coronary artery 04/09/2012  . PUD (peptic ulcer disease) 04/09/2012  . Epigastric pain 06/18/2011  . HTN (hypertension) 06/18/2011  . Type 1 diabetes mellitus ([place]) 06/18/2011  . Hyperlipidemia 06/18/2011    Past Surgical History:  Procedure Laterality Date  . BIOPSY  09/24/2017   Procedure: BIOPSY;  Surgeon: Danie Binder, MD;  [facility];  Service: Endoscopy;;  gastric bx's  . COLONOSCOPY WITH PROPOFOL N/A 09/24/2017   Procedure: COLONOSCOPY WITH PROPOFOL;  Surgeon: Danie Binder, MD;  [facility];  Service: Endoscopy;  Laterality: N/A;  7:30am  . CORONARY ANGIOPLASTY WITH STENT PLACEMENT  04/07/2012   70% prox LAD, 70-80% ostial diagonal, 70% mid-distal LAD, 80% prox OM1, mid LCx totally occluded just distal to OM1 s/p DES, occluded RCA; severe inferior HK, LVEF 45%  .  CORONARY ARTERY BYPASS GRAFT N/A 04/23/2016   Procedure: CORONARY ARTERY BYPASS GRAFTING (CABG) x 4;  Surgeon: Gaye Pollack, MD;  Location: [facility];  Service: Open Heart Surgery;  Laterality: N/A;  . ENDOVEIN HARVEST OF GREATER SAPHENOUS VEIN Right 04/23/2016   Procedure: ENDOVEIN HARVEST OF GREATER SAPHENOUS VEIN;  Surgeon: Gaye Pollack, MD;  Location: [facility];  Service: Open Heart Surgery;  Laterality: Right;  . ESOPHAGOGASTRODUODENOSCOPY  06/18/2011   multiple ulcers in proximal stomach with stigmata of bleeding s/p  biopsy, multiple superficial ulcers in duodenal bulb. normal ampulla and second portion of duodenum  . ESOPHAGOGASTRODUODENOSCOPY  2013  . ESOPHAGOGASTRODUODENOSCOPY (EGD) WITH PROPOFOL N/A 09/24/2017   Procedure: ESOPHAGOGASTRODUODENOSCOPY (EGD) WITH PROPOFOL;  Surgeon: Danie Binder, MD;  [facility];  Service: Endoscopy;  Laterality: N/A;  . INTRAVASCULAR PRESSURE WIRE/FFR STUDY N/A 04/13/2016   Procedure: Intravascular Pressure Wire/FFR Study;  Surgeon: Nelva Bush, MD;  Location: [place] [facility];  Service: Cardiovascular;  Laterality: N/A;  . LEFT HEART CATH AND CORONARY ANGIOGRAPHY N/A 04/13/2016   Procedure: Left Heart Cath and Coronary Angiography;  Surgeon: Nelva Bush, MD;  Location: [place] [facility];  Service: Cardiovascular;  Laterality: N/A;  . LEFT HEART CATHETERIZATION WITH CORONARY ANGIOGRAM N/A 04/07/2012   Procedure: LEFT HEART CATHETERIZATION WITH CORONARY ANGIOGRAM;  Surgeon: Sherren Mocha, MD;  Location: [facility] CATH LAB;  Service: Cardiovascular;  Laterality: N/A;  . PERCUTANEOUS CORONARY STENT INTERVENTION (PCI-S)  04/07/2012   Procedure: PERCUTANEOUS CORONARY STENT INTERVENTION (PCI-S);  Surgeon: Sherren Mocha, MD;  Location: [facility] CATH LAB;  Service: Cardiovascular;;  . POLYPECTOMY  09/24/2017   Procedure: POLYPECTOMY;  Surgeon: Danie Binder, MD;  [facility];  Service: Endoscopy;;  hepatic flexure polyp, transverse colon polyps x4  . TEE WITHOUT CARDIOVERSION N/A 04/23/2016   Procedure: TRANSESOPHAGEAL ECHOCARDIOGRAM (TEE);  Surgeon: Gaye Pollack, MD;  Location: [facility];  Service: Open Heart Surgery;  Laterality: N/A;        Home Medications    Prior to Admission medications   Medication Sig Start Date End Date Taking? Authorizing Provider  aspirin EC 81 MG EC tablet Take 1 tablet (81 mg total) by mouth daily. 04/09/12  Yes Arguello, Roger A, PA-C  atorvastatin (LIPITOR) 40 MG tablet TAKE ONE (1) TABLET EACH DAY Patient taking differently:  Take 40 mg by mouth every evening.  08/08/18  Yes Claretta Fraise, MD  diphenoxylate-atropine (LOMOTIL) 2.5-0.025 MG tablet Take 2 tablets by mouth 4 (four) times daily as needed for diarrhea or loose stools. 12/17/17  Yes Stacks, Cletus Gash, MD  escitalopram (LEXAPRO) 20 MG tablet Take 1 tablet (20 mg total) by mouth daily. Patient taking differently: Take 20 mg by mouth at bedtime.  08/13/18  Yes Claretta Fraise, MD  gabapentin (NEURONTIN) 600 MG tablet Take 1 tablet (600 mg total) by mouth 3 (three) times daily. 05/01/18  Yes Stacks, Cletus Gash, MD  JARDIANCE 25 MG TABS tablet TAKE ONE (1) TABLET EACH DAY Patient taking differently: Take 25 mg by mouth daily.  08/19/18  Yes Stacks, Cletus Gash, MD  lisinopril (ZESTRIL) 2.5 MG tablet Take 1 tablet (2.5 mg total) by mouth daily. 05/01/18  Yes Claretta Fraise, MD  metFORMIN (GLUCOPHAGE) 1000 MG tablet Take 1 tablet (1,000 mg total) by mouth 2 (two) times daily. 05/01/18  Yes Stacks, Cletus Gash, MD  metoprolol tartrate (LOPRESSOR) 25 MG tablet TAKE 1 TABLET BY MOUTH TWICE DAILY (PATIENT  NEEDS  TO  SCHEDULE  APPOINTMENT  TO  RECEIVE  FURTHER  REFILLS) 05/01/18  Yes Stacks, Cletus Gash, MD  nitroGLYCERIN (NITROSTAT) 0.4 MG SL tablet Place 1 tablet (0.4 mg total) under the tongue every 5 (five) minutes as needed for chest pain. X 3 doses 08/25/18  Yes Stacks, Cletus Gash, MD  oxyCODONE (OXY IR/ROXICODONE) 5 MG immediate release tablet Take 1 tablet (5 mg total) by mouth every 12 (twelve) hours as needed for severe pain. Chronic Pain. Dx: G89.4, G62.9 05/07/18  Yes Meredith Staggers, MD  pantoprazole (PROTONIX) 40 MG tablet Take 1 tablet (40 mg total) by mouth daily. 05/01/18  Yes Claretta Fraise, MD  pioglitazone (ACTOS) 30 MG tablet Take 1 tablet (30 mg total) by mouth daily. ) Patient taking differently: Take 30 mg by mouth every evening.  05/01/18  Yes Claretta Fraise, MD  sildenafil (REVATIO) 20 MG tablet Take 2-5 pills at once, orally, with each sexual encounter Patient taking differently:  Take 20-100 mg by mouth daily as needed. with each sexual encounter 05/01/18  Yes Stacks, Cletus Gash, MD  furosemide (LASIX) 20 MG tablet Take 1 tablet (20 mg total) by mouth daily as needed for fluid. 05/01/18   Claretta Fraise, MD    Family History Family History  Problem Relation Age of Onset  . Heart attack Father        MI x 2 in late 50's, currently 54's  . CVA Sister        late 22s  . CAD Maternal Uncle        MI at 28  . Colon cancer Neg Hx   . Colon polyps Neg Hx     Social History Social History   Tobacco Use  . Smoking status: Former Smoker    Packs/day: 1.00    Years: 20.00    Pack years: 20.00    Types: Cigarettes    Quit date: 2000    Years since quitting: 20.6  . Smokeless tobacco: Former Systems developer    Quit date: 01/01/1998  Substance Use Topics  . Alcohol use: No  . Drug use: No     Allergies   No known allergies   Review of Systems Review of Systems  Constitutional: Positive for diaphoresis. Negative for chills and fever.  HENT: Negative for facial swelling and sore throat.   Respiratory: Positive for shortness of breath. Negative for cough.   Cardiovascular: Positive for chest pain. Negative for leg swelling.  Gastrointestinal: Negative for abdominal pain, nausea and vomiting.  Genitourinary: Negative for dysuria.  Musculoskeletal: Negative for back pain.  Skin: Negative for rash and wound.  Neurological: Positive for dizziness. Negative for headaches.  Psychiatric/Behavioral: The patient is not nervous/anxious.      Physical Exam Updated Vital Signs BP 118/88   Pulse 72   Temp 98 F (36.7 C) (Oral)   Resp 13   Ht 5\' 9"  (1.753 m)   Wt 105.2 kg   SpO2 99%   BMI 34.26 kg/m   Physical Exam Vitals signs and nursing note reviewed.  Constitutional:      General: He is not in acute distress.    Appearance: He is well-developed. He is not diaphoretic.  HENT:     Head: Normocephalic and atraumatic.     Mouth/Throat:     Pharynx: No oropharyngeal  exudate.  Eyes:     General: No scleral icterus.       Right eye: No discharge.        Left eye: No discharge.     Conjunctiva/sclera: Conjunctivae normal.  Pupils: Pupils are equal, round, and reactive to light.  Neck:     Musculoskeletal: Normal range of motion and neck supple.     Thyroid: No thyromegaly.  Cardiovascular:     Rate and Rhythm: Normal rate and regular rhythm.     Heart sounds: Normal heart sounds. No murmur. No friction rub. No gallop.   Pulmonary:     Effort: Pulmonary effort is normal. No respiratory distress.     Breath sounds: Normal breath sounds. No stridor. No wheezing or rales.  Chest:     Chest wall: No tenderness.  Abdominal:     General: Bowel sounds are normal. There is no distension.     Palpations: Abdomen is soft.     Tenderness: There is no abdominal tenderness. There is no guarding or rebound.  Musculoskeletal:     Right lower leg: He exhibits no tenderness. No edema.     Left lower leg: He exhibits no tenderness. No edema.  Lymphadenopathy:     Cervical: No cervical adenopathy.  Skin:    General: Skin is warm and dry.     Coloration: Skin is not pale.     Findings: No rash.  Neurological:     Mental Status: He is alert.     Coordination: Coordination normal.      ED Treatments / Results  Labs (all labs ordered are listed, but only abnormal results are displayed) Labs Reviewed  COMPREHENSIVE METABOLIC PANEL - Abnormal; Notable for the following components:      Result Value   Glucose, Bld 145 (*)    All other components within normal limits  SARS CORONAVIRUS 2 (HOSPITAL ORDER, [facility])  CBC  TROPONIN I (HIGH SENSITIVITY)  TROPONIN I (HIGH SENSITIVITY)    EKG None  Radiology Dg Chest 2 View  Result Date: 08/25/2018 CLINICAL DATA:  Chest pain, shortness of breath, dizziness. EXAM: CHEST - 2 VIEW COMPARISON:  Radiograph May 30, 2016 FINDINGS: Postsurgical changes related to prior CABG including  intact and aligned sternotomy wires and multiple surgical clips projecting over the mediastinum. Hypoventilatory changes with central vascular crowding. No consolidation, features of edema, pneumothorax, or effusion. Pulmonary vascularity is normally distributed. The cardiomediastinal contours are unremarkable. No acute osseous or soft tissue abnormality. IMPRESSION: Atelectasis, otherwise no acute cardiopulmonary abnormality. Electronically Signed   By: Lovena Le M.D.   On: 08/25/2018 19:27    Procedures Procedures (including critical care time)  Medications Ordered in ED Medications  aspirin chewable tablet 324 mg (324 mg Oral Given 08/25/18 1910)  fentaNYL (SUBLIMAZE) injection 50 mcg (50 mcg Intravenous Given 08/25/18 1910)     Initial Impression / Assessment and Plan / ED Course  I have reviewed the triage vital signs and the nursing notes.  Pertinent labs & imaging results that were available during my care of the patient were reviewed by me and considered in my medical decision making (see chart for details).        Patient with a significant cardiac history presenting with chest pain concerning for ACS.  Patient describes a heaviness with shortness of breath, diaphoresis, and dizziness.  Heart score is 6.  Initial troponin is 8 with second troponin pending.  Mild hyperglycemia at 145, otherwise labs are unremarkable.  Chest x-ray shows atelectasis.  EKG shows NSR with left anterior fascicular block.  Aspirin 324 mg given.  Nitroglycerin not given as patient reports taking Viagra last evening.  Patient is pain-free after dose of fentanyl 50  mcg.  Considering patient's high risk, I feel observation admission is best with trending of troponin.  I discussed patient case with Dr. Darrick Meigs with [facility] who accepts patient for admission.  I appreciate his assistance with the patient.  Patient also evaluated by my attending, Dr. Lacinda Axon, who guided the patient's management and agrees with plan.  Final  Clinical Impressions(s) / ED Diagnoses   Final diagnoses:  Chest pain, unspecified type    ED Discharge Orders    None       Caryl Ada 08/25/18 2151    Nat Christen, MD 08/26/18 [phone]

## 2018-08-25 NOTE — Telephone Encounter (Signed)
Refill sent.

## 2018-08-25 NOTE — ED Triage Notes (Signed)
Pt states he began having chest pain this morning which has increased throughout the day after doing some weedeating today. Was having some sob and dizziness earlier.

## 2018-08-26 ENCOUNTER — Other Ambulatory Visit: Payer: Self-pay

## 2018-08-26 DIAGNOSIS — E785 Hyperlipidemia, unspecified: Secondary | ICD-10-CM

## 2018-08-26 DIAGNOSIS — R42 Dizziness and giddiness: Secondary | ICD-10-CM

## 2018-08-26 DIAGNOSIS — R0602 Shortness of breath: Secondary | ICD-10-CM | POA: Diagnosis not present

## 2018-08-26 DIAGNOSIS — E7800 Pure hypercholesterolemia, unspecified: Secondary | ICD-10-CM

## 2018-08-26 DIAGNOSIS — E119 Type 2 diabetes mellitus without complications: Secondary | ICD-10-CM

## 2018-08-26 DIAGNOSIS — I10 Essential (primary) hypertension: Secondary | ICD-10-CM

## 2018-08-26 DIAGNOSIS — R079 Chest pain, unspecified: Secondary | ICD-10-CM | POA: Diagnosis not present

## 2018-08-26 DIAGNOSIS — I25708 Atherosclerosis of coronary artery bypass graft(s), unspecified, with other forms of angina pectoris: Secondary | ICD-10-CM

## 2018-08-26 DIAGNOSIS — E1165 Type 2 diabetes mellitus with hyperglycemia: Secondary | ICD-10-CM

## 2018-08-26 DIAGNOSIS — E1159 Type 2 diabetes mellitus with other circulatory complications: Secondary | ICD-10-CM

## 2018-08-26 DIAGNOSIS — I5032 Chronic diastolic (congestive) heart failure: Secondary | ICD-10-CM

## 2018-08-26 LAB — COMPREHENSIVE METABOLIC PANEL
ALT: 19 U/L (ref 0–44)
AST: 16 U/L (ref 15–41)
Albumin: 3.6 g/dL (ref 3.5–5.0)
Alkaline Phosphatase: 67 U/L (ref 38–126)
Anion gap: 8 (ref 5–15)
BUN: 21 mg/dL — ABNORMAL HIGH (ref 6–20)
CO2: 25 mmol/L (ref 22–32)
Calcium: 8.8 mg/dL — ABNORMAL LOW (ref 8.9–10.3)
Chloride: 105 mmol/L (ref 98–111)
Creatinine, Ser: 0.78 mg/dL (ref 0.61–1.24)
GFR calc Af Amer: >60 mL/min (ref >60)
GFR calc non Af Amer: >60 mL/min (ref >60)
Glucose, Bld: 153 mg/dL — ABNORMAL HIGH (ref 70–99)
Potassium: 3.7 mmol/L (ref 3.5–5.1)
Sodium: 138 mmol/L (ref 135–145)
Total Bilirubin: 0.7 mg/dL (ref 0.3–1.2)
Total Protein: 6.5 g/dL (ref 6.5–8.1)

## 2018-08-26 LAB — CBC
HCT: 45.6 % (ref 39.0–52.0)
Hemoglobin: 15.4 g/dL (ref 13.0–17.0)
MCH: 32.2 pg (ref 26.0–34.0)
MCHC: 33.8 g/dL (ref 30.0–36.0)
MCV: 95.4 fL (ref 80.0–100.0)
Platelets: 215 10*3/uL (ref 150–400)
RBC: 4.78 MIL/uL (ref 4.22–5.81)
RDW: 12.8 % (ref 11.5–15.5)
WBC: 7.7 10*3/uL (ref 4.0–10.5)
nRBC: 0 % (ref 0.0–0.2)

## 2018-08-26 LAB — TROPONIN I (HIGH SENSITIVITY)
Troponin I (High Sensitivity): 9 ng/L (ref <18)
Troponin I (High Sensitivity): 8 ng/L (ref <18)

## 2018-08-26 LAB — GLUCOSE, CAPILLARY
Glucose-Capillary: 138 mg/dL — ABNORMAL HIGH (ref 70–99)
Glucose-Capillary: 209 mg/dL — ABNORMAL HIGH (ref 70–99)

## 2018-08-26 NOTE — Discharge Summary (Signed)
Physician Discharge Summary  Juan Ray [id number] DOB: [birth year] DOA: 08/25/2018  PCP: Claretta Fraise, MD  Admit date: 08/25/2018 Discharge date: 08/26/2018  Admitted From: Home Disposition: Home  Recommendations for Outpatient Follow-up:  1. Follow up with PCP in 1-2 weeks 2. Please obtain BMP/CBC in one week 3. Follow-up with Dr. Burt Knack next 2 to 3 weeks  Discharge Condition: Stable CODE STATUS: Full code Diet recommendation: Heart healthy, carb modified  Brief/Interim Summary: 58 year old male with history of coronary artery disease, admitted to the hospital with chest pain.  Patient ruled out for ACS with negative cardiac markers.  He did not have any acute EKG changes.  Symptoms were somewhat atypical.  He did receive fentanyl in the emergency room that he reported improved his pain.  He did not have any recurrence of chest pain since admission.  He was seen by cardiology who did not feel that further cardiac work-up was needed at this time.  Recommendations were to follow-up with his outpatient cardiologist.  The remainder of his medical problems remained stable  Discharge Diagnoses:  Principal Problem:   Chest pain Active Problems:   HTN (hypertension)   Hyperlipidemia   CAD (coronary artery disease), native coronary artery   Chronic diastolic CHF (congestive heart failure) (HCC)   DM2 (diabetes mellitus, type 2) ([place])    Discharge Instructions  Discharge Instructions    Diet - low sodium heart healthy   Complete by: As directed    Increase activity slowly   Complete by: As directed      Allergies as of 08/26/2018      Reactions   No Known Allergies       Medication List    TAKE these medications   aspirin 81 MG EC tablet Take 1 tablet (81 mg total) by mouth daily.   atorvastatin 40 MG tablet Commonly known as: LIPITOR TAKE ONE (1) TABLET EACH DAY What changed: See the new instructions.   diphenoxylate-atropine 2.5-0.025 MG tablet Commonly  known as: Lomotil Take 2 tablets by mouth 4 (four) times daily as needed for diarrhea or loose stools.   escitalopram 20 MG tablet Commonly known as: LEXAPRO Take 1 tablet (20 mg total) by mouth daily. What changed: when to take this   furosemide 20 MG tablet Commonly known as: LASIX Take 1 tablet (20 mg total) by mouth daily as needed for fluid.   gabapentin 600 MG tablet Commonly known as: NEURONTIN Take 1 tablet (600 mg total) by mouth 3 (three) times daily.   Jardiance 25 MG Tabs tablet Generic drug: empagliflozin TAKE ONE (1) TABLET EACH DAY What changed: See the new instructions.   lisinopril 2.5 MG tablet Commonly known as: ZESTRIL Take 1 tablet (2.5 mg total) by mouth daily.   metFORMIN 1000 MG tablet Commonly known as: GLUCOPHAGE Take 1 tablet (1,000 mg total) by mouth 2 (two) times daily.   metoprolol tartrate 25 MG tablet Commonly known as: LOPRESSOR TAKE 1 TABLET BY MOUTH TWICE DAILY (PATIENT  NEEDS  TO  SCHEDULE  APPOINTMENT  TO  RECEIVE  FURTHER  REFILLS)   nitroGLYCERIN 0.4 MG SL tablet Commonly known as: NITROSTAT Place 1 tablet (0.4 mg total) under the tongue every 5 (five) minutes as needed for chest pain. X 3 doses   oxyCODONE 5 MG immediate release tablet Commonly known as: Oxy IR/ROXICODONE Take 1 tablet (5 mg total) by mouth every 12 (twelve) hours as needed for severe pain. Chronic Pain. Dx: G89.4, G62.9   pantoprazole 40 MG tablet  Commonly known as: PROTONIX Take 1 tablet (40 mg total) by mouth daily.   pioglitazone 30 MG tablet Commonly known as: ACTOS TAKE ONE (1) TABLET EACH DAY What changed: See the new instructions.   sildenafil 20 MG tablet Commonly known as: REVATIO Take 2-5 pills at once, orally, with each sexual encounter What changed:   how much to take  how to take this  when to take this  reasons to take this  additional instructions      Follow-up Information    Sherren Mocha, MD Follow up.   Specialty:  Cardiology Why: call for follow up appointment in 2 weeks Contact information: [address] [place] [address] [phone]          Allergies  Allergen Reactions  . No Known Allergies     Consultations:  cardiology   Procedures/Studies: Dg Chest 2 View  Result Date: 08/25/2018 CLINICAL DATA:  Chest pain, shortness of breath, dizziness. EXAM: CHEST - 2 VIEW COMPARISON:  Radiograph May 30, 2016 FINDINGS: Postsurgical changes related to prior CABG including intact and aligned sternotomy wires and multiple surgical clips projecting over the mediastinum. Hypoventilatory changes with central vascular crowding. No consolidation, features of edema, pneumothorax, or effusion. Pulmonary vascularity is normally distributed. The cardiomediastinal contours are unremarkable. No acute osseous or soft tissue abnormality. IMPRESSION: Atelectasis, otherwise no acute cardiopulmonary abnormality. Electronically Signed   By: Lovena Le M.D.   On: 08/25/2018 19:27      Subjective: No further chest pain since admission  Discharge Exam: Vitals:   08/25/18 2230 08/25/18 2304 08/26/18 0504 08/26/18 0805  BP: 123/71 108/77 118/79   Pulse: 60 61 65   Resp: 11 18 19    Temp:  98.4 F (36.9 C) 98.2 F (36.8 C)   TempSrc:  Oral Oral   SpO2: 97% 99% 98% 99%  Weight:  104.1 kg    Height:  5\' 9"  (1.753 m)      General: Pt is alert, awake, not in acute distress Cardiovascular: RRR, S1/S2 +, no rubs, no gallops Respiratory: CTA bilaterally, no wheezing, no rhonchi Abdominal: Soft, NT, ND, bowel sounds + Extremities: no edema, no cyanosis    The results of significant diagnostics from this hospitalization (including imaging, microbiology, ancillary and laboratory) are listed below for reference.     Microbiology: Recent Results (from the past 240 hour(s))  SARS Coronavirus 2 [facility] order, Performed in [facility] hospital lab) Nasopharyngeal Nasopharyngeal Swab      Status: None   Collection Time: 08/25/18  9:38 PM   Specimen: Nasopharyngeal Swab  Result Value Ref Range Status   SARS Coronavirus 2 NEGATIVE NEGATIVE Final    Comment: (NOTE) If result is NEGATIVE SARS-CoV-2 target nucleic acids are NOT DETECTED. The SARS-CoV-2 RNA is generally detectable in upper and lower  respiratory specimens during the acute phase of infection. The lowest  concentration of SARS-CoV-2 viral copies this assay can detect is 250  copies / mL. A negative result does not preclude SARS-CoV-2 infection  and should not be used as the sole basis for treatment or other  patient management decisions.  A negative result may occur with  improper specimen collection / handling, submission of specimen other  than nasopharyngeal swab, presence of viral mutation(s) within the  areas targeted by this assay, and inadequate number of viral copies  (<250 copies / mL). A negative result must be combined with clinical  observations, patient history, and epidemiological information. If result is POSITIVE SARS-CoV-2  target nucleic acids are DETECTED. The SARS-CoV-2 RNA is generally detectable in upper and lower  respiratory specimens dur ing the acute phase of infection.  Positive  results are indicative of active infection with SARS-CoV-2.  Clinical  correlation with patient history and other diagnostic information is  necessary to determine patient infection status.  Positive results do  not rule out bacterial infection or co-infection with other viruses. If result is PRESUMPTIVE POSTIVE SARS-CoV-2 nucleic acids MAY BE PRESENT.   A presumptive positive result was obtained on the submitted specimen  and confirmed on repeat testing.  While 2019 novel coronavirus  (SARS-CoV-2) nucleic acids may be present in the submitted sample  additional confirmatory testing may be necessary for epidemiological  and / or clinical management purposes  to differentiate between  SARS-CoV-2 and other  Sarbecovirus currently known to infect humans.  If clinically indicated additional testing with an alternate test  methodology [phone]) is advised. The SARS-CoV-2 RNA is generally  detectable in upper and lower respiratory sp ecimens during the acute  phase of infection. The expected result is Negative. Fact Sheet for Patients:  StrictlyIdeas.no Fact Sheet for Healthcare Providers: BankingDealers.co.za This test is not yet approved or cleared by the [place] FDA and has been authorized for detection and/or diagnosis of SARS-CoV-2 by FDA under an Emergency Use Authorization (EUA).  This EUA will remain in effect (meaning this test can be used) for the duration of the COVID-19 declaration under Section 564(b)(1) of the Act, 21 U.S.C. section 360bbb-3(b)(1), unless the authorization is terminated or revoked sooner. Performed at [facility], [address] [place], [place] [address]      Labs: BNP (last 3 results) No results for input(s): BNP in the last 8760 hours. Basic Metabolic Panel: Recent Labs  Lab 08/25/18 1906 08/26/18 0542  NA 138 138  K 4.0 3.7  CL 105 105  CO2 25 25  GLUCOSE 145* 153*  BUN 20 21*  CREATININE 1.10 0.78  CALCIUM 9.1 8.8*   Liver Function Tests: Recent Labs  Lab 08/25/18 1906 08/26/18 0542  AST 18 16  ALT 19 19  ALKPHOS 68 67  BILITOT 0.4 0.7  PROT 6.6 6.5  ALBUMIN 3.7 3.6   No results for input(s): LIPASE, AMYLASE in the last 168 hours. No results for input(s): AMMONIA in the last 168 hours. CBC: Recent Labs  Lab 08/25/18 1906 08/26/18 0542  WBC 9.4 7.7  HGB 15.0 15.4  HCT 44.4 45.6  MCV 95.1 95.4  PLT 237 215   Cardiac Enzymes: No results for input(s): CKTOTAL, CKMB, CKMBINDEX, TROPONINI in the last 168 hours. BNP: Invalid input(s): POCBNP CBG: Recent Labs  Lab 08/25/18 2306 08/26/18 0750 08/26/18 1100  GLUCAP 130* 138* 209*   D-Dimer No results for input(s): DDIMER  in the last 72 hours. Hgb A1c No results for input(s): HGBA1C in the last 72 hours. Lipid Profile No results for input(s): CHOL, HDL, LDLCALC, TRIG, CHOLHDL, LDLDIRECT in the last 72 hours. Thyroid function studies No results for input(s): TSH, T4TOTAL, T3FREE, THYROIDAB in the last 72 hours.  Invalid input(s): FREET3 Anemia work up No results for input(s): VITAMINB12, FOLATE, FERRITIN, TIBC, IRON, RETICCTPCT in the last 72 hours. Urinalysis    Component Value Date/Time   COLORURINE YELLOW 04/19/2016 1110   APPEARANCEUR CLEAR 04/19/2016 1110   LABSPEC 1.027 04/19/2016 1110   PHURINE 5.0 04/19/2016 1110   GLUCOSEU 50 (A) 04/19/2016 1110   HGBUR NEGATIVE 04/19/2016 1110   BILIRUBINUR NEGATIVE 04/19/2016 1110  KETONESUR NEGATIVE 04/19/2016 1110   PROTEINUR NEGATIVE 04/19/2016 1110   UROBILINOGEN 0.2 06/18/2011 0242   NITRITE NEGATIVE 04/19/2016 1110   LEUKOCYTESUR NEGATIVE 04/19/2016 1110   Sepsis Labs Invalid input(s): PROCALCITONIN,  WBC,  LACTICIDVEN Microbiology Recent Results (from the past 240 hour(s))  SARS Coronavirus 2 [facility] order, Performed in [facility] hospital lab) Nasopharyngeal Nasopharyngeal Swab     Status: None   Collection Time: 08/25/18  9:38 PM   Specimen: Nasopharyngeal Swab  Result Value Ref Range Status   SARS Coronavirus 2 NEGATIVE NEGATIVE Final    Comment: (NOTE) If result is NEGATIVE SARS-CoV-2 target nucleic acids are NOT DETECTED. The SARS-CoV-2 RNA is generally detectable in upper and lower  respiratory specimens during the acute phase of infection. The lowest  concentration of SARS-CoV-2 viral copies this assay can detect is 250  copies / mL. A negative result does not preclude SARS-CoV-2 infection  and should not be used as the sole basis for treatment or other  patient management decisions.  A negative result may occur with  improper specimen collection / handling, submission of specimen other  than nasopharyngeal swab, presence of  viral mutation(s) within the  areas targeted by this assay, and inadequate number of viral copies  (<250 copies / mL). A negative result must be combined with clinical  observations, patient history, and epidemiological information. If result is POSITIVE SARS-CoV-2 target nucleic acids are DETECTED. The SARS-CoV-2 RNA is generally detectable in upper and lower  respiratory specimens dur ing the acute phase of infection.  Positive  results are indicative of active infection with SARS-CoV-2.  Clinical  correlation with patient history and other diagnostic information is  necessary to determine patient infection status.  Positive results do  not rule out bacterial infection or co-infection with other viruses. If result is PRESUMPTIVE POSTIVE SARS-CoV-2 nucleic acids MAY BE PRESENT.   A presumptive positive result was obtained on the submitted specimen  and confirmed on repeat testing.  While 2019 novel coronavirus  (SARS-CoV-2) nucleic acids may be present in the submitted sample  additional confirmatory testing may be necessary for epidemiological  and / or clinical management purposes  to differentiate between  SARS-CoV-2 and other Sarbecovirus currently known to infect humans.  If clinically indicated additional testing with an alternate test  methodology [phone]) is advised. The SARS-CoV-2 RNA is generally  detectable in upper and lower respiratory sp ecimens during the acute  phase of infection. The expected result is Negative. Fact Sheet for Patients:  StrictlyIdeas.no Fact Sheet for Healthcare Providers: BankingDealers.co.za This test is not yet approved or cleared by the [place] FDA and has been authorized for detection and/or diagnosis of SARS-CoV-2 by FDA under an Emergency Use Authorization (EUA).  This EUA will remain in effect (meaning this test can be used) for the duration of the COVID-19 declaration under Section  564(b)(1) of the Act, 21 U.S.C. section 360bbb-3(b)(1), unless the authorization is terminated or revoked sooner. Performed at [facility], [address] [place], [place] [address]      Time coordinating discharge: 80mins  SIGNED:   Kathie Dike, MD  [facility] 08/26/2018, 8:00 PM   If 7PM-7AM, please contact night-coverage [email or web address]

## 2018-08-26 NOTE — Consult Note (Signed)
Cardiology Consultation:   Patient ID: Juan Ray MRN: [id number]; DOB: Aug 11, 1960  Admit date: 08/25/2018 Date of Consult: 08/26/2018  Primary Care Provider: Claretta Fraise, MD Primary Cardiologist: Juan Mocha, MD  Primary Electrophysiologist:  None    Patient Profile:   Juan Ray is a 58 y.o. male with a hx of CAD and CABG who is being seen today for the evaluation of chest pain at the request of Dr. Roderic Ray.  History of Present Illness:   Juan Ray  Is a 58 yr old male with a history of CAD. The patient initially presented in 2014 with an inferolateral STEMI and was treated with drug-eluting stent placement in the left circumflex.  He ultimately underwent CABG in 2018 for progressive multivessel disease.  He was treated with LIMA-LAD, SVG-OM1 and OM2, and SVG-RCA. Other problems include hypertension, hyperlipidemia.  He was out fishing with his wife and child yesterday and began to develop chest pressure.  It stayed fairly constant throughout the day and was associated with shortness of breath and dizziness.  He had taken Viagra the evening before and was afraid to take nitroglycerin.  That being said, he could not actually find his nitroglycerin.  He was given fentanyl in the ED and since then he has not had any recurrence of chest pain.  He told me he is feeling much better today and said "I feel like I could run home ".  He also denies shortness of breath, leg swelling, and palpitations.  He said he spoke with his wife today and there was some question as to whether or not he took a double dose of one of his pills which could have led to his symptoms of dizziness.  High-sensitivity troponins were normal.  ECG demonstrated sinus rhythm with old inferior infarct and left anterior fascicular block with no acute ischemic abnormalities.  Chest x-ray showed some atelectasis.   Heart Pathway Score:     Past Medical History:  Diagnosis Date  . Anxiety   . Arthritis   .  CAD (coronary artery disease) 04/07/2012   a. Inferoposterior STEMI s/p DES-mid LCx. b. s/p CABG 04/2016.  . Diabetes mellitus   . Diabetic neuropathy ([place])   . GERD (gastroesophageal reflux disease)   . High cholesterol   . History of kidney stones   . Hypertension   . Ischemic cardiomyopathy    a. EF not totally clear - in 04/2016 was 35-40% by cath, 55-60% by echo, 40-45% by TEE all around same time.  . Myocardial infarction ([place]) 2013  . Neuropathy   . Obesity   . Pancreatitis    a. mild by CT 06/2011  . Peptic ulcer disease    a. 06/2009 EGD: multiple gastric and duodenal ulcers.    Past Surgical History:  Procedure Laterality Date  . BIOPSY  09/24/2017   Procedure: BIOPSY;  Surgeon: Juan Binder, MD;  [facility];  Service: Endoscopy;;  gastric bx's  . COLONOSCOPY WITH PROPOFOL N/A 09/24/2017   Procedure: COLONOSCOPY WITH PROPOFOL;  Surgeon: Juan Binder, MD;  [facility];  Service: Endoscopy;  Laterality: N/A;  7:30am  . CORONARY ANGIOPLASTY WITH STENT PLACEMENT  04/07/2012   70% prox LAD, 70-80% ostial diagonal, 70% mid-distal LAD, 80% prox OM1, mid LCx totally occluded just distal to OM1 s/p DES, occluded RCA; severe inferior HK, LVEF 45%  . CORONARY ARTERY BYPASS GRAFT N/A 04/23/2016   Procedure: CORONARY ARTERY BYPASS GRAFTING (CABG) x 4;  Surgeon: Juan Pollack,  MD;  Location: [facility];  Service: Open Heart Surgery;  Laterality: N/A;  . ENDOVEIN HARVEST OF GREATER SAPHENOUS VEIN Right 04/23/2016   Procedure: ENDOVEIN HARVEST OF GREATER SAPHENOUS VEIN;  Surgeon: Juan Pollack, MD;  Location: [place];  Service: Open Heart Surgery;  Laterality: Right;  . ESOPHAGOGASTRODUODENOSCOPY  06/18/2011   multiple ulcers in proximal stomach with stigmata of bleeding s/p biopsy, multiple superficial ulcers in duodenal bulb. normal ampulla and second portion of duodenum  . ESOPHAGOGASTRODUODENOSCOPY  2013  . ESOPHAGOGASTRODUODENOSCOPY (EGD) WITH PROPOFOL N/A 09/24/2017    Procedure: ESOPHAGOGASTRODUODENOSCOPY (EGD) WITH PROPOFOL;  Surgeon: Juan Binder, MD;  [facility];  Service: Endoscopy;  Laterality: N/A;  . INTRAVASCULAR PRESSURE WIRE/FFR STUDY N/A 04/13/2016   Procedure: Intravascular Pressure Wire/FFR Study;  Surgeon: Juan Bush, MD;  Location: [place] [facility];  Service: Cardiovascular;  Laterality: N/A;  . LEFT HEART CATH AND CORONARY ANGIOGRAPHY N/A 04/13/2016   Procedure: Left Heart Cath and Coronary Angiography;  Surgeon: Juan Bush, MD;  Location: [place] [facility];  Service: Cardiovascular;  Laterality: N/A;  . LEFT HEART CATHETERIZATION WITH CORONARY ANGIOGRAM N/A 04/07/2012   Procedure: LEFT HEART CATHETERIZATION WITH CORONARY ANGIOGRAM;  Surgeon: Juan Mocha, MD;  Location: [facility] C[facility];  Service: Cardiovascular;  Laterality: N/A;  . PERCUTANEOUS CORONARY STENT INTERVENTION (PCI-S)  04/07/2012   Procedure: PERCUTANEOUS CORONARY STENT INTERVENTION (PCI-S);  Surgeon: Juan Mocha, MD;  Location: [facility] CATH LAB;  Service: Cardiovascular;;  . POLYPECTOMY  09/24/2017   Procedure: POLYPECTOMY;  Surgeon: Juan Binder, MD;  [facility];  Service: Endoscopy;;  hepatic flexure polyp, transverse colon polyps x4  . TEE WITHOUT CARDIOVERSION N/A 04/23/2016   Procedure: TRANSESOPHAGEAL ECHOCARDIOGRAM (TEE);  Surgeon: Juan Pollack, MD;  Location: [place];  Service: Open Heart Surgery;  Laterality: N/A;       Inpatient Medications: Scheduled Meds: . aspirin EC  81 mg Oral Daily  . atorvastatin  40 mg Oral QPM  . enoxaparin (LOVENOX) injection  0.5 mg/kg Subcutaneous Q24H  . escitalopram  20 mg Oral QHS  . gabapentin  600 mg Oral TID  . insulin aspart  0-9 Units Subcutaneous TID WC  . lisinopril  2.5 mg Oral Daily  . metoprolol tartrate  25 mg Oral BID  . pantoprazole  40 mg Oral Daily   Continuous Infusions: . sodium chloride     PRN Meds: acetaminophen **OR** acetaminophen, oxyCODONE  Allergies:    Allergies   Allergen Reactions  . No Known Allergies     Social History:   Social History   Socioeconomic History  . Marital status: Married    Spouse name: Not on file  . Number of children: 2  . Years of education: Not on file  . Highest education level: Not on file  Occupational History  . Occupation: truck Animator Needs  . Financial resource strain: Not on file  . Food insecurity    Worry: Not on file    Inability: Not on file  . Transportation needs    Medical: Not on file    Non-medical: Not on file  Tobacco Use  . Smoking status: Former Smoker    Packs/day: 1.00    Years: 20.00    Pack years: 20.00    Types: Cigarettes    Quit date: 2000    Years since quitting: 20.6  . Smokeless tobacco: Former Systems developer    Quit date: 01/01/1998  Substance and Sexual Activity  . Alcohol  use: No  . Drug use: No  . Sexual activity: Yes  Lifestyle  . Physical activity    Days per week: Not on file    Minutes per session: Not on file  . Stress: Not on file  Relationships  . Social Herbalist on phone: Not on file    Gets together: Not on file    Attends religious service: Not on file    Active member of club or organization: Not on file    Attends meetings of clubs or organizations: Not on file    Relationship status: Not on file  . Intimate partner violence    Fear of current or ex partner: Not on file    Emotionally abused: Not on file    Physically abused: Not on file    Forced sexual activity: Not on file  Other Topics Concern  . Not on file  Social History Narrative   Lives in [place], with his wife and 2 children    Family History:    Family History  Problem Relation Age of Onset  . Heart attack Father        MI x 2 in late 50's, currently 44's  . CVA Sister        late 93s  . CAD Maternal Uncle        MI at 32  . Colon cancer Neg Hx   . Colon polyps Neg Hx      ROS:  Please see the history of present illness.   All other ROS reviewed and  negative.     Physical Exam/Data:   Vitals:   08/25/18 2230 08/25/18 2304 08/26/18 0504 08/26/18 0805  BP: 123/71 108/77 118/79   Pulse: 60 61 65   Resp: 11 18 19    Temp:  98.4 F (36.9 C) 98.2 F (36.8 C)   TempSrc:  Oral Oral   SpO2: 97% 99% 98% 99%  Weight:  104.1 kg    Height:  5\' 9"  (1.753 m)      Intake/Output Summary (Last 24 hours) at 08/26/2018 1009 Last data filed at 08/26/2018 0954 Gross per 24 hour  Intake 840 ml  Output -  Net 840 ml   Last 3 Weights 08/25/2018 08/25/2018 05/07/2018  Weight (lbs) 229 lb 8 oz 232 lb 238 lb  Weight (kg) 104.1 kg 105.235 kg 107.956 kg     Body mass index is 33.89 kg/m.  General:  Well nourished, well developed, in no acute distress HEENT: normal Lymph: no adenopathy Neck: no JVD Endocrine:  No thryomegaly Cardiac:  normal S1, S2; RRR; no murmur  Lungs:  clear to auscultation bilaterally, no wheezing, rhonchi or rales  Abd: soft, nontender, no hepatomegaly  Ext: no edema Musculoskeletal:  No deformities, BUE and BLE strength normal and equal Skin: warm and dry  Neuro:  CNs 2-12 intact, no focal abnormalities noted Psych:  Normal affect   EKG:  The EKG was personally reviewed and demonstrates:  Sinus rhythm, LAFB, old inferior infarct Telemetry:  Telemetry was personally reviewed and demonstrates: Sinus rhythm with PVCs  Relevant CV Studies: Cardiac Catheterization 04-28-16: Conclusion   Conclusions: 1. Significant 3-vessel coronary artery disease, including sequential 50-70% ostial, proximal, and mid LAD stenoses, which are hemodynamically significant (resting Pd/Pa 0.72), 50% in-stent restenosis in mid LCx as well as 80% 90% stenoses involving OM2 and distal LCx, and chronic total occlusion of mid RCA with distal vessel filling via bridging and left-to-right collaterals. 2. Upper normal  left ventricular filling pressure. 3. Moderately reduced left ventricular contraction with inferior akinesis (LVEF 35-40%).   Recommendations: 1. Cardiac surgery consultation for CABG, given significant 3-vessel coronary artery disease, reduced LV function, and diabetes mellitus. 2. Aggressive secondary prevention, including escalation of statin therapy. 3. Start isosorbide mononitrate 30 mg daily; if patient experiences side effects, he can decrease the dose to 15 mg daily. 4. Proceed with transthoracic echocardiogram, as previously ordered.   Echo 04-19-2016: Study Conclusions  - Left ventricle: The cavity size was normal. There was moderate concentric hypertrophy. Systolic function was normal. The estimated ejection fraction was in the range of 55% to 60%. There is akinesis of the inferior myocardium. Doppler parameters are consistent with abnormal left ventricular relaxation (grade 1 diastolic dysfunction). - Aortic valve: Trileaflet; mildly thickened, moderately calcified leaflets.   Laboratory Data:  High Sensitivity Troponin:   Recent Labs  Lab 08/25/18 1906 08/25/18 2105 08/26/18 0319 08/26/18 0542  TROPONINIHS 8 8 9 8      Chemistry Recent Labs  Lab 08/25/18 1906 08/26/18 0542  NA 138 138  K 4.0 3.7  CL 105 105  CO2 25 25  GLUCOSE 145* 153*  BUN 20 21*  CREATININE 1.10 0.78  CALCIUM 9.1 8.8*  GFRNONAA >60 >60  GFRAA >60 >60  ANIONGAP 8 8    Recent Labs  Lab 08/25/18 1906 08/26/18 0542  PROT 6.6 6.5  ALBUMIN 3.7 3.6  AST 18 16  ALT 19 19  ALKPHOS 68 67  BILITOT 0.4 0.7   Hematology Recent Labs  Lab 08/25/18 1906 08/26/18 0542  WBC 9.4 7.7  RBC 4.67 4.78  HGB 15.0 15.4  HCT 44.4 45.6  MCV 95.1 95.4  MCH 32.1 32.2  MCHC 33.8 33.8  RDW 12.9 12.8  PLT 237 215   BNPNo results for input(s): BNP, PROBNP in the last 168 hours.  DDimer No results for input(s): DDIMER in the last 168 hours.   Radiology/Studies:  Dg Chest 2 View  Result Date: 08/25/2018 CLINICAL DATA:  Chest pain, shortness of breath, dizziness. EXAM: CHEST - 2 VIEW COMPARISON:   Radiograph May 30, 2016 FINDINGS: Postsurgical changes related to prior CABG including intact and aligned sternotomy wires and multiple surgical clips projecting over the mediastinum. Hypoventilatory changes with central vascular crowding. No consolidation, features of edema, pneumothorax, or effusion. Pulmonary vascularity is normally distributed. The cardiomediastinal contours are unremarkable. No acute osseous or soft tissue abnormality. IMPRESSION: Atelectasis, otherwise no acute cardiopulmonary abnormality. Electronically Signed   By: Lovena Le M.D.   On: 08/25/2018 19:27    Assessment and Plan:   1. Chest pain/CAD: Symptomatically stable.  He has been pain-free since receiving fentanyl in the ED yesterday evening.  High-sensitivity troponins are normal.  ECG is without acute ischemic abnormalities.  I would continue home medication regimen which includes aspirin, statin, and beta-blocker.  If he has symptom recurrence, an outpatient nuclear stress test could be considered.  2. Hyperlipidemia: LDL 94 in October 2019.  Currently on atorvastatin 40 mg.  3. Type 2 DM: On metformin and Jardiance.  Also on atorvastatin.   [facility] will sign off.   Medication Recommendations:  See above Other recommendations (labs, testing, etc):  NA Follow up as an outpatient:  With Dr. Burt Knack or APP  For questions or updates, please contact [facility] Please consult [email or web address] for contact info under     Signed, Kate Sable, MD  08/26/2018 10:09 AM

## 2018-08-27 LAB — HIV ANTIBODY (ROUTINE TESTING W REFLEX): HIV Screen 4th Generation wRfx: NONREACTIVE

## 2018-08-28 LAB — HEMOGLOBIN A1C
Hgb A1c MFr Bld: 7.3 % — ABNORMAL HIGH (ref 4.8–5.6)
Mean Plasma Glucose: 162.81 mg/dL

## 2018-09-14 NOTE — Progress Notes (Signed)
Cardiology Office Note    Date:  09/15/2018   ID:  Juan Ray, DOB [birth year], MRN [id number]  PCP:  Claretta Fraise, MD  Cardiologist:  Dr. Burt Knack  Chief Complaint: Hospital follow up  History of Present Illness:   Juan Ray is a 58 y.o. male CAD s/p CABG, DM, HLD, HTN, and ICM with improved LV function seen for hospital follow up.  The patient initially presented in 2014 with an inferolateral STEMI and was treated with drug-eluting stent placement in the left circumflex. He ultimately underwent CABG in 2018 for progressive multivessel disease. He was treated with LIMA-LAD, SVG-OM1 and OM2, and SVG-RCA. No evaluation since then.  Admitted 08/2018 with CP. Symptoms resolved with fentanyl. Observed overnight. Ruled out and discharged.   Here today for follow up.  No recurrent episode.  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  He denies shortness of breath, dizziness, palpitation, orthopnea, PND, syncope, lower extremity edema or melena.  He cannot afford Jardiance.  Patient is under a lot of stress recently.  Going through divorce.  He takes Lasix as needed for intermittent lower extremity edema which mostly occurs with long distance driving.   Past Medical History:  Diagnosis Date  . Anxiety   . Arthritis   . CAD (coronary artery disease) 04/07/2012   a. Inferoposterior STEMI s/p DES-mid LCx. b. s/p CABG 04/2016.  . Diabetes mellitus   . Diabetic neuropathy ([place])   . GERD (gastroesophageal reflux disease)   . High cholesterol   . History of kidney stones   . Hypertension   . Ischemic cardiomyopathy    a. EF not totally clear - in 04/2016 was 35-40% by cath, 55-60% by echo, 40-45% by TEE all around same time.  . Myocardial infarction ([place]) 2013  . Neuropathy   . Obesity   . Pancreatitis    a. mild by CT 06/2011  . Peptic ulcer disease    a. 06/2009 EGD: multiple gastric and duodenal ulcers.    Past Surgical  History:  Procedure Laterality Date  . BIOPSY  09/24/2017   Procedure: BIOPSY;  Surgeon: Danie Binder, MD;  [facility];  Service: Endoscopy;;  gastric bx's  . COLONOSCOPY WITH PROPOFOL N/A 09/24/2017   Procedure: COLONOSCOPY WITH PROPOFOL;  Surgeon: Danie Binder, MD;  [facility];  Service: Endoscopy;  Laterality: N/A;  7:30am  . CORONARY ANGIOPLASTY WITH STENT PLACEMENT  04/07/2012   70% prox LAD, 70-80% ostial diagonal, 70% mid-distal LAD, 80% prox OM1, mid LCx totally occluded just distal to OM1 s/p DES, occluded RCA; severe inferior HK, LVEF 45%  . CORONARY ARTERY BYPASS GRAFT N/A 04/23/2016   Procedure: CORONARY ARTERY BYPASS GRAFTING (CABG) x 4;  Surgeon: Gaye Pollack, MD;  Location: [facility];  Service: Open Heart Surgery;  Laterality: N/A;  . ENDOVEIN HARVEST OF GREATER SAPHENOUS VEIN Right 04/23/2016   Procedure: ENDOVEIN HARVEST OF GREATER SAPHENOUS VEIN;  Surgeon: Gaye Pollack, MD;  Location: [facility];  Service: Open Heart Surgery;  Laterality: Right;  . ESOPHAGOGASTRODUODENOSCOPY  06/18/2011   multiple ulcers in proximal stomach with stigmata of bleeding s/p biopsy, multiple superficial ulcers in duodenal bulb. normal ampulla and second portion of duodenum  . ESOPHAGOGASTRODUODENOSCOPY  2013  . ESOPHAGOGASTRODUODENOSCOPY (EGD) WITH PROPOFOL N/A 09/24/2017   Procedure: ESOPHAGOGASTRODUODENOSCOPY (EGD) WITH PROPOFOL;  Surgeon: Danie Binder, MD;  [facility];  Service: Endoscopy;  Laterality: N/A;  .  INTRAVASCULAR PRESSURE WIRE/FFR STUDY N/A 04/13/2016   Procedure: Intravascular Pressure Wire/FFR Study;  Surgeon: Nelva Bush, MD;  Location: [facility];  Service: Cardiovascular;  Laterality: N/A;  . LEFT HEART CATH AND CORONARY ANGIOGRAPHY N/A 04/13/2016   Procedure: Left Heart Cath and Coronary Angiography;  Surgeon: Nelva Bush, MD;  Location: [facility];  Service: Cardiovascular;  Laterality: N/A;  . LEFT HEART CATHETERIZATION  WITH CORONARY ANGIOGRAM N/A 04/07/2012   Procedure: LEFT HEART CATHETERIZATION WITH CORONARY ANGIOGRAM;  Surgeon: Sherren Mocha, MD;  Location: [facility] C[facility];  Service: Cardiovascular;  Laterality: N/A;  . PERCUTANEOUS CORONARY STENT INTERVENTION (PCI-S)  04/07/2012   Procedure: PERCUTANEOUS CORONARY STENT INTERVENTION (PCI-S);  Surgeon: Sherren Mocha, MD;  Location: [facility] CATH LAB;  Service: Cardiovascular;;  . POLYPECTOMY  09/24/2017   Procedure: POLYPECTOMY;  Surgeon: Danie Binder, MD;  [facility];  Service: Endoscopy;;  hepatic flexure polyp, transverse colon polyps x4  . TEE WITHOUT CARDIOVERSION N/A 04/23/2016   Procedure: TRANSESOPHAGEAL ECHOCARDIOGRAM (TEE);  Surgeon: Gaye Pollack, MD;  Location: [facility];  Service: Open Heart Surgery;  Laterality: N/A;    Current Medications: Prior to Admission medications   Medication Sig Start Date End Date Taking? Authorizing Provider  aspirin EC 81 MG EC tablet Take 1 tablet (81 mg total) by mouth daily. 04/09/12   Arguello, Roger A, PA-C  atorvastatin (LIPITOR) 40 MG tablet TAKE ONE (1) TABLET EACH DAY Patient taking differently: Take 40 mg by mouth every evening.  08/08/18   Claretta Fraise, MD  diphenoxylate-atropine (LOMOTIL) 2.5-0.025 MG tablet Take 2 tablets by mouth 4 (four) times daily as needed for diarrhea or loose stools. 12/17/17   Claretta Fraise, MD  escitalopram (LEXAPRO) 20 MG tablet Take 1 tablet (20 mg total) by mouth daily. Patient taking differently: Take 20 mg by mouth at bedtime.  08/13/18   Claretta Fraise, MD  furosemide (LASIX) 20 MG tablet Take 1 tablet (20 mg total) by mouth daily as needed for fluid. 05/01/18   Claretta Fraise, MD  gabapentin (NEURONTIN) 600 MG tablet Take 1 tablet (600 mg total) by mouth 3 (three) times daily. 05/01/18   Claretta Fraise, MD  JARDIANCE 25 MG TABS tablet TAKE ONE (1) TABLET EACH DAY Patient taking differently: Take 25 mg by mouth daily.  08/19/18   Claretta Fraise, MD  lisinopril (ZESTRIL) 2.5  MG tablet Take 1 tablet (2.5 mg total) by mouth daily. 05/01/18   Claretta Fraise, MD  metFORMIN (GLUCOPHAGE) 1000 MG tablet Take 1 tablet (1,000 mg total) by mouth 2 (two) times daily. 05/01/18   Claretta Fraise, MD  metoprolol tartrate (LOPRESSOR) 25 MG tablet TAKE 1 TABLET BY MOUTH TWICE DAILY (PATIENT  NEEDS  TO  SCHEDULE  APPOINTMENT  TO  RECEIVE  FURTHER  REFILLS) 05/01/18   Claretta Fraise, MD  nitroGLYCERIN (NITROSTAT) 0.4 MG SL tablet Place 1 tablet (0.4 mg total) under the tongue every 5 (five) minutes as needed for chest pain. X 3 doses 08/25/18   Claretta Fraise, MD  oxyCODONE (OXY IR/ROXICODONE) 5 MG immediate release tablet Take 1 tablet (5 mg total) by mouth every 12 (twelve) hours as needed for severe pain. Chronic Pain. Dx: G89.4, G62.9 05/07/18   Meredith Staggers, MD  pantoprazole (PROTONIX) 40 MG tablet Take 1 tablet (40 mg total) by mouth daily. 05/01/18   Claretta Fraise, MD  pioglitazone (ACTOS) 30 MG tablet TAKE ONE (1) TABLET EACH DAY 08/26/18   Claretta Fraise, MD  sildenafil (REVATIO)  20 MG tablet Take 2-5 pills at once, orally, with each sexual encounter Patient taking differently: Take 20-100 mg by mouth daily as needed. with each sexual encounter 05/01/18   Claretta Fraise, MD    Allergies:   No known allergies   Social History   Socioeconomic History  . Marital status: Married    Spouse name: Not on file  . Number of children: 2  . Years of education: Not on file  . Highest education level: Not on file  Occupational History  . Occupation: truck Animator Needs  . Financial resource strain: Not on file  . Food insecurity    Worry: Not on file    Inability: Not on file  . Transportation needs    Medical: Not on file    Non-medical: Not on file  Tobacco Use  . Smoking status: Former Smoker    Packs/day: 1.00    Years: 20.00    Pack years: 20.00    Types: Cigarettes    Quit date: 2000    Years since quitting: 20.7  . Smokeless tobacco: Former Systems developer    Quit date:  01/01/1998  Substance and Sexual Activity  . Alcohol use: No  . Drug use: No  . Sexual activity: Yes  Lifestyle  . Physical activity    Days per week: Not on file    Minutes per session: Not on file  . Stress: Not on file  Relationships  . Social Herbalist on phone: Not on file    Gets together: Not on file    Attends religious service: Not on file    Active member of club or organization: Not on file    Attends meetings of clubs or organizations: Not on file    Relationship status: Not on file  Other Topics Concern  . Not on file  Social History Narrative   Lives in [place], with his wife and 2 children     Family History:  The patient's family history includes CAD in his maternal uncle; CVA in his sister; Heart attack in his father.   ROS:   Please see the history of present illness.    ROS All other systems reviewed and are negative.   PHYSICAL EXAM:   VS:  BP 130/72   Pulse 71   Ht 5\' 9"  (1.753 m)   Wt 244 lb 12.8 oz (111 kg)   SpO2 98%   BMI 36.15 kg/m    GEN: Well nourished, well developed, in no acute distress  HEENT: normal  Neck: no JVD, carotid bruits, or masses Cardiac: RRR; no murmurs, rubs, or gallops,no edema  Respiratory:  clear to auscultation bilaterally, normal work of breathing GI: soft, nontender, nondistended, + BS MS: no deformity or atrophy  Skin: warm and dry, no rash Neuro:  Alert and Oriented x 3, Strength and sensation are intact Psych: euthymic mood, full affect  Wt Readings from Last 3 Encounters:  09/15/18 244 lb 12.8 oz (111 kg)  08/25/18 229 lb 8 oz (104.1 kg)  05/07/18 238 lb (108 kg)      Studies/Labs Reviewed:   EKG:  EKG is not ordered today.    Recent Labs: 08/26/2018: ALT 19; BUN 21; Creatinine, Ser 0.78; Hemoglobin 15.4; Platelets 215; Potassium 3.7; Sodium 138   Lipid Panel    Component Value Date/Time   CHOL 181 10/15/2017 1559   TRIG 244 (H) 10/15/2017 1559   HDL 38 (L) 10/15/2017 1559   CHOLHDL  4.8 10/15/2017 1559   CHOLHDL 4.1 04/05/2017 1216   VLDL 29 05/01/2016 0856   LDLCALC 94 10/15/2017 1559   LDLCALC 78 04/05/2017 1216    Additional studies/ records that were reviewed today include:   Echocardiogram: 04/2016 Left ventricle: The cavity size was normal. There was moderate   concentric hypertrophy. Systolic function was normal. The   estimated ejection fraction was in the range of 55% to 60%. There   is akinesis of the inferior myocardium. Doppler parameters are   consistent with abnormal left ventricular relaxation (grade 1   diastolic dysfunction). - Aortic valve: Trileaflet; mildly thickened, moderately calcified   leaflets  ASSESSMENT & PLAN:    1. CAD - No recurrent chest pain.  He he has lot of stress.  He is working out with lifting 3 days/week.  He also works in farm.  No exertional chest pain or shortness of breath.  Continue aspirin, statin, and beta-blocker.  2.  Diabetes mellitus -He cannot afford Jardiance.  Recent hemoglobin A1c 7.3. -Advised to follow-up with PCP for diabetic management.  3.  Hypertension -Blood pressure stable on current medication.  4. HLD - Continue statin   5.  Surgical clearance -Patient has upcoming bilateral upper eyelid surgery to improve his vision.  Unsure of anesthesia.  He is easily getting greater than 4 METS of activity.  He is clear at acceptable risk if surgery clearance requested.  Medication Adjustments/Labs and Tests Ordered: Current medicines are reviewed at length with the patient today.  Concerns regarding medicines are outlined above.  Medication changes, Labs and Tests ordered today are listed in the Patient Instructions below. Patient Instructions  Medication Instructions:  Your physician recommends that you continue on your current medications as directed. Please refer to the Current Medication list given to you today.  If you need a refill on your cardiac medications before your next appointment,  please call your pharmacy.   Lab work: None ordered  If you have labs (blood work) drawn today and your tests are completely normal, you will receive your results only by: Marland Kitchen MyChart Message (if you have MyChart) OR . A paper copy in the mail If you have any lab test that is abnormal or we need to change your treatment, we will call you to review the results.  Testing/Procedures: None ordered  Follow-Up: At [facility], you and your health needs are our priority.  As part of our continuing mission to provide you with exceptional heart care, we have created designated Provider Care Teams.  These Care Teams include your primary Cardiologist (physician) and Advanced Practice Providers (APPs -  Physician Assistants and Nurse Practitioners) who all work together to provide you with the care you need, when you need it. You will need a follow up appointment in:  12 months.  Please call our office 2 months in advance to schedule this appointment.  You may see Sherren Mocha, MD or one of the following Advanced Practice Providers on your designated Care Team: Richardson Dopp, PA-C [place], [place] . Daune Perch, NP  Any Other Special Instructions Will Be Listed Below (If Applicable).       Jarrett Soho, [place]  09/15/2018 3:59 PM    [address], [place], [place]  [address] Phone: [phone]; Fax: [phone]

## 2018-09-15 ENCOUNTER — Encounter: Payer: Self-pay | Admitting: Physician Assistant

## 2018-09-15 ENCOUNTER — Ambulatory Visit: Payer: BC Managed Care – PPO | Admitting: Physician Assistant

## 2018-09-15 ENCOUNTER — Other Ambulatory Visit: Payer: Self-pay

## 2018-09-15 VITALS — BP 130/72 | HR 71 | Ht 69.0 in | Wt 244.8 lb

## 2018-09-15 DIAGNOSIS — E785 Hyperlipidemia, unspecified: Secondary | ICD-10-CM

## 2018-09-15 DIAGNOSIS — E119 Type 2 diabetes mellitus without complications: Secondary | ICD-10-CM | POA: Diagnosis not present

## 2018-09-15 DIAGNOSIS — I10 Essential (primary) hypertension: Secondary | ICD-10-CM | POA: Diagnosis not present

## 2018-09-15 DIAGNOSIS — I2510 Atherosclerotic heart disease of native coronary artery without angina pectoris: Secondary | ICD-10-CM | POA: Diagnosis not present

## 2018-09-15 NOTE — Patient Instructions (Addendum)
Medication Instructions:  Your physician recommends that you continue on your current medications as directed. Please refer to the Current Medication list given to you today.  If you need a refill on your cardiac medications before your next appointment, please call your pharmacy.   Lab work: None ordered  If you have labs (blood work) drawn today and your tests are completely normal, you will receive your results only by: . MyChart Message (if you have MyChart) OR . A paper copy in the mail If you have any lab test that is abnormal or we need to change your treatment, we will call you to review the results.  Testing/Procedures: None ordered  Follow-Up: At [facility], you and your health needs are our priority.  As part of our continuing mission to provide you with exceptional heart care, we have created designated Provider Care Teams.  These Care Teams include your primary Cardiologist (physician) and Advanced Practice Providers (APPs -  Physician Assistants and Nurse Practitioners) who all work together to provide you with the care you need, when you need it. You will need a follow up appointment in:  12 months.  Please call our office 2 months in advance to schedule this appointment.  You may see Michael Cooper, MD or one of the following Advanced Practice Providers on your designated Care Team: Scott Weaver, PA-C Vin Bhagat, PA-C . Janine Hammond, NP  Any Other Special Instructions Will Be Listed Below (If Applicable).    

## 2018-09-24 ENCOUNTER — Encounter

## 2018-09-24 ENCOUNTER — Encounter
Payer: BC Managed Care – PPO | Attending: Physical Medicine & Rehabilitation | Admitting: Physical Medicine & Rehabilitation

## 2018-10-06 ENCOUNTER — Telehealth: Payer: Self-pay | Admitting: *Deleted

## 2018-10-06 NOTE — Telephone Encounter (Signed)
A MyChart letter has been sent and one will be mailed as well.

## 2018-10-20 ENCOUNTER — Other Ambulatory Visit: Payer: Self-pay | Admitting: Family Medicine

## 2018-10-20 DIAGNOSIS — G5711 Meralgia paresthetica, right lower limb: Secondary | ICD-10-CM

## 2018-11-24 ENCOUNTER — Other Ambulatory Visit: Payer: Self-pay | Admitting: Family Medicine

## 2018-12-11 ENCOUNTER — Other Ambulatory Visit: Payer: Self-pay | Admitting: Family Medicine

## 2019-01-13 ENCOUNTER — Ambulatory Visit: Payer: Self-pay | Attending: Internal Medicine

## 2019-01-13 ENCOUNTER — Other Ambulatory Visit: Payer: Self-pay

## 2019-01-13 DIAGNOSIS — Z20822 Contact with and (suspected) exposure to covid-19: Secondary | ICD-10-CM | POA: Insufficient documentation

## 2019-01-14 LAB — NOVEL CORONAVIRUS, NAA: SARS-CoV-2, NAA: NOT DETECTED

## 2019-03-20 ENCOUNTER — Other Ambulatory Visit: Payer: Self-pay | Admitting: Family Medicine

## 2019-04-07 ENCOUNTER — Ambulatory Visit (INDEPENDENT_AMBULATORY_CARE_PROVIDER_SITE_OTHER): Payer: Self-pay | Admitting: Family Medicine

## 2019-04-07 ENCOUNTER — Encounter: Payer: Self-pay | Admitting: Family Medicine

## 2019-04-07 ENCOUNTER — Ambulatory Visit (HOSPITAL_COMMUNITY)
Admission: RE | Admit: 2019-04-07 | Discharge: 2019-04-07 | Disposition: A | Discharge status: Home / Self Care | Payer: Self-pay | Source: Ambulatory Visit | Attending: Family Medicine | Admitting: Family Medicine

## 2019-04-07 ENCOUNTER — Other Ambulatory Visit: Payer: Self-pay

## 2019-04-07 VITALS — BP 118/60 | HR 88 | Temp 97.6°F | Resp 18 | Ht 69.0 in | Wt 241.0 lb

## 2019-04-07 DIAGNOSIS — M549 Dorsalgia, unspecified: Secondary | ICD-10-CM | POA: Insufficient documentation

## 2019-04-07 DIAGNOSIS — R0789 Other chest pain: Secondary | ICD-10-CM

## 2019-04-07 NOTE — Progress Notes (Signed)
Subjective:    Patient ID: Juan Ray, male    DOB: 01-20-1960, 59 y.o.   MRN: [id number]  HPI  I have not seen the patient since April 2019.  He transferred his care to [facility].  This appointment was made without is realizing that he was seeing another physician.  Patient presents today with a 9-week history of neuropathic pain radiating from the center of his back at roughly the level of T8 around his right flank into his right mid axillary line and to just below his right nipple.  Is a burning stinging fire-like pain.  He denies any shingles-like rash in that area in the last 2 months.  There is no erythema or rash in that area today.  There is no tenderness to palpation.  There is no tenderness to palpation over the ribs.  He denies any pleurisy.  He denies any hemoptysis.  He denies any shortness of breath.  History sounds consistent with possible thoracic radicular nerve pain however the cause is unknown.  Patient also request a refill of his oxycodone.  We have not refill this medication in 2 years.  Previously the patient was seeing Dr. Claretta Fraise as well as Dr. Tessa Lerner.  I am unclear as to why the patient stopped.  He states that he is transferring back to this office.  I explained to the patient that we do not encourage patients to switch between physicians repeatedly due to gaps in care that can potentially arise.  Patient's last lab work was in August of last year which showed a hemoglobin A1c of 7.3.  Patient states that he was unable to get the pain medication from Dr. Tessa Lerner due to the fact he was unable to go to the clinic primarily due to cost and lack of insurance. Past Medical History:  Diagnosis Date  . Anxiety   . Arthritis   . CAD (coronary artery disease) 04/07/2012   a. Inferoposterior STEMI s/p DES-mid LCx. b. s/p CABG 04/2016.  . Diabetes mellitus   . Diabetic neuropathy ([place])   . GERD (gastroesophageal reflux disease)   . High  cholesterol   . History of kidney stones   . Hypertension   . Ischemic cardiomyopathy    a. EF not totally clear - in 04/2016 was 35-40% by cath, 55-60% by echo, 40-45% by TEE all around same time.  . Myocardial infarction ([place]) 2013  . Neuropathy   . Obesity   . Pancreatitis    a. mild by CT 06/2011  . Peptic ulcer disease    a. 06/2009 EGD: multiple gastric and duodenal ulcers.   Past Surgical History:  Procedure Laterality Date  . BIOPSY  09/24/2017   Procedure: BIOPSY;  Surgeon: Danie Binder, MD;  [facility];  Service: Endoscopy;;  gastric bx's  . COLONOSCOPY WITH PROPOFOL N/A 09/24/2017   Procedure: COLONOSCOPY WITH PROPOFOL;  Surgeon: Danie Binder, MD;  [facility];  Service: Endoscopy;  Laterality: N/A;  7:30am  . CORONARY ANGIOPLASTY WITH STENT PLACEMENT  04/07/2012   70% prox LAD, 70-80% ostial diagonal, 70% mid-distal LAD, 80% prox OM1, mid LCx totally occluded just distal to OM1 s/p DES, occluded RCA; severe inferior HK, LVEF 45%  . CORONARY ARTERY BYPASS GRAFT N/A 04/23/2016   Procedure: CORONARY ARTERY BYPASS GRAFTING (CABG) x 4;  Surgeon: Gaye Pollack, MD;  Location: [facility];  Service: Open Heart Surgery;  Laterality: N/A;  . ENDOVEIN HARVEST OF GREATER SAPHENOUS  VEIN Right 04/23/2016   Procedure: ENDOVEIN HARVEST OF GREATER SAPHENOUS VEIN;  Surgeon: Gaye Pollack, MD;  Location: [facility];  Service: Open Heart Surgery;  Laterality: Right;  . ESOPHAGOGASTRODUODENOSCOPY  06/18/2011   multiple ulcers in proximal stomach with stigmata of bleeding s/p biopsy, multiple superficial ulcers in duodenal bulb. normal ampulla and second portion of duodenum  . ESOPHAGOGASTRODUODENOSCOPY  2013  . ESOPHAGOGASTRODUODENOSCOPY (EGD) WITH PROPOFOL N/A 09/24/2017   Procedure: ESOPHAGOGASTRODUODENOSCOPY (EGD) WITH PROPOFOL;  Surgeon: Danie Binder, MD;  [facility];  Service: Endoscopy;  Laterality: N/A;  . INTRAVASCULAR PRESSURE WIRE/FFR STUDY N/A 04/13/2016    Procedure: Intravascular Pressure Wire/FFR Study;  Surgeon: Nelva Bush, MD;  Location: [facility];  Service: Cardiovascular;  Laterality: N/A;  . LEFT HEART CATH AND CORONARY ANGIOGRAPHY N/A 04/13/2016   Procedure: Left Heart Cath and Coronary Angiography;  Surgeon: Nelva Bush, MD;  Location: [facility];  Service: Cardiovascular;  Laterality: N/A;  . LEFT HEART CATHETERIZATION WITH CORONARY ANGIOGRAM N/A 04/07/2012   Procedure: LEFT HEART CATHETERIZATION WITH CORONARY ANGIOGRAM;  Surgeon: Sherren Mocha, MD;  Location: [facility];  Service: Cardiovascular;  Laterality: N/A;  . PERCUTANEOUS CORONARY STENT INTERVENTION (PCI-S)  04/07/2012   Procedure: PERCUTANEOUS CORONARY STENT INTERVENTION (PCI-S);  Surgeon: Sherren Mocha, MD;  Location: [facility] CATH LAB;  Service: Cardiovascular;;  . POLYPECTOMY  09/24/2017   Procedure: POLYPECTOMY;  Surgeon: Danie Binder, MD;  [facility];  Service: Endoscopy;;  hepatic flexure polyp, transverse colon polyps x4  . TEE WITHOUT CARDIOVERSION N/A 04/23/2016   Procedure: TRANSESOPHAGEAL ECHOCARDIOGRAM (TEE);  Surgeon: Gaye Pollack, MD;  Location: [facility];  Service: Open Heart Surgery;  Laterality: N/A;   Current Outpatient Medications on File Prior to Visit  Medication Sig Dispense Refill  . aspirin EC 81 MG EC tablet Take 1 tablet (81 mg total) by mouth daily.    Marland Kitchen atorvastatin (LIPITOR) 40 MG tablet TAKE ONE (1) TABLET EACH DAY 90 tablet 0  . escitalopram (LEXAPRO) 20 MG tablet Take 1 tablet (20 mg total) by mouth daily. 90 tablet 1  . furosemide (LASIX) 20 MG tablet Take 1 tablet (20 mg total) by mouth daily as needed for fluid. 30 tablet 3  . gabapentin (NEURONTIN) 600 MG tablet TAKE ONE (1) TABLET THREE (3) TIMES EACH DAY 90 tablet 2  . lisinopril (ZESTRIL) 2.5 MG tablet Take 1 tablet (2.5 mg total) by mouth daily. 90 tablet 2  . metFORMIN (GLUCOPHAGE) 1000 MG tablet TAKE ONE TABLET BY MOUTH TWICE DAILY 180 tablet 0  .  metoprolol tartrate (LOPRESSOR) 25 MG tablet TAKE 1 TABLET BY MOUTH TWICE DAILY (PATIENT  NEEDS  TO  SCHEDULE  APPOINTMENT  TO  RECEIVE  FURTHER  REFILLS) 180 tablet 3  . nitroGLYCERIN (NITROSTAT) 0.4 MG SL tablet Place 1 tablet (0.4 mg total) under the tongue every 5 (five) minutes as needed for chest pain. X 3 doses 25 tablet prn  . pantoprazole (PROTONIX) 40 MG tablet Take 1 tablet (40 mg total) by mouth daily. 30 tablet 11  . pioglitazone (ACTOS) 30 MG tablet Take 1 tablet (30 mg total) by mouth daily. (Needs to be seen before next refill) 30 tablet 0  . sildenafil (REVATIO) 20 MG tablet Take 2-5 pills at once, orally, with each sexual encounter 50 tablet 5  . oxyCODONE (OXY IR/ROXICODONE) 5 MG immediate release tablet Take 1 tablet (5 mg total) by mouth every 12 (twelve) hours as needed for severe  pain. Chronic Pain. Dx: G89.4, G62.9 (Patient not taking: Reported on 04/07/2019) 60 tablet 0   No current facility-administered medications on file prior to visit.   Allergies  Allergen Reactions  . No Known Allergies    Social History   Socioeconomic History  . Marital status: Married    Spouse name: Not on file  . Number of children: 2  . Years of education: Not on file  . Highest education level: Not on file  Occupational History  . Occupation: truck Geophysicist/field seismologist  Tobacco Use  . Smoking status: Former Smoker    Packs/day: 1.00    Years: 20.00    Pack years: 20.00    Types: Cigarettes    Quit date: 2000    Years since quitting: 21.2  . Smokeless tobacco: Former Systems developer    Quit date: 01/01/1998  Substance and Sexual Activity  . Alcohol use: No  . Drug use: No  . Sexual activity: Yes  Other Topics Concern  . Not on file  Social History Narrative   Lives in [place], with his wife and 2 children   Social Determinants of Health   Financial Resource Strain:   . Difficulty of Paying Living Expenses:   Food Insecurity:   . Worried About Charity fundraiser in the Last Year:   . Youth worker in the Last Year:   Transportation Needs:   . Film/video editor (Medical):   Marland Kitchen Lack of Transportation (Non-Medical):   Physical Activity:   . Days of Exercise per Week:   . Minutes of Exercise per Session:   Stress:   . Feeling of Stress :   Social Connections:   . Frequency of Communication with Friends and Family:   . Frequency of Social Gatherings with Friends and Family:   . Attends Religious Services:   . Active Member of Clubs or Organizations:   . Attends Archivist Meetings:   Marland Kitchen Marital Status:   Intimate Partner Violence:   . Fear of Current or Ex-Partner:   . Emotionally Abused:   Marland Kitchen Physically Abused:   . Sexually Abused:      Review of Systems  All other systems reviewed and are negative.      Objective:   Physical Exam Vitals reviewed.  Constitutional:      Appearance: He is obese.  Cardiovascular:     Rate and Rhythm: Normal rate and regular rhythm.     Heart sounds: Normal heart sounds.  Pulmonary:     Effort: Pulmonary effort is normal. No respiratory distress.     Breath sounds: Normal breath sounds. No wheezing, rhonchi or rales.    Chest:     Chest wall: No tenderness.    Abdominal:     General: Bowel sounds are normal.     Palpations: Abdomen is soft.  Neurological:     Mental Status: He is alert.           Assessment & Plan:  Chest wall pain - Plan: DG Chest 2 View  Mid back pain - Plan: DG Thoracic Spine W/Swimmers  I believe the patient was erroneously scheduled by my office without the knowledge that he was seen a different doctor for his primary care.  Therefore I have requested that I be allowed to review his records from his previous physician prior to refilling his oxycodone.  I would like to review the records to make sure that there are no extenuating circumstances that caused him to  stop getting his oxycodone filled by his other physician.  In the meantime I will work-up the cause of his right-sided  chest wall pain.  I believe the pain is most likely neuropathic in nature.  I suspect either shingles or a pinched nerve in the thoracic spine.  I will obtain a chest x-ray to rule out any pathology along the ribs and also a T-spine to rule out any skeletal based neoplasm that could possibly cause nerve impingement.

## 2019-04-09 ENCOUNTER — Telehealth: Payer: Self-pay | Admitting: Family Medicine

## 2019-04-09 NOTE — Telephone Encounter (Signed)
Awaiting MD to sign off on it and send to me

## 2019-04-09 NOTE — Telephone Encounter (Signed)
PT CALLING ABOUT XRAY RESULT [phone]

## 2019-04-10 ENCOUNTER — Other Ambulatory Visit: Payer: Self-pay | Admitting: Family Medicine

## 2019-04-10 DIAGNOSIS — M75101 Unspecified rotator cuff tear or rupture of right shoulder, not specified as traumatic: Secondary | ICD-10-CM

## 2019-04-10 DIAGNOSIS — M7521 Bicipital tendinitis, right shoulder: Secondary | ICD-10-CM

## 2019-04-10 DIAGNOSIS — G5711 Meralgia paresthetica, right lower limb: Secondary | ICD-10-CM

## 2019-04-10 DIAGNOSIS — G894 Chronic pain syndrome: Secondary | ICD-10-CM

## 2019-04-10 MED ORDER — OXYCODONE HCL 5 MG PO TABS: 5.0000 mg | ORAL_TABLET | Freq: Two times a day (BID) | ORAL | 0 refills | Status: DC | PRN

## 2019-04-10 NOTE — Telephone Encounter (Signed)
Pt aware.

## 2019-05-26 ENCOUNTER — Other Ambulatory Visit: Payer: Self-pay | Admitting: Family Medicine

## 2019-05-26 ENCOUNTER — Other Ambulatory Visit: Payer: Self-pay

## 2019-05-26 DIAGNOSIS — I10 Essential (primary) hypertension: Secondary | ICD-10-CM

## 2019-05-26 DIAGNOSIS — G5711 Meralgia paresthetica, right lower limb: Secondary | ICD-10-CM

## 2019-05-26 DIAGNOSIS — G894 Chronic pain syndrome: Secondary | ICD-10-CM

## 2019-05-26 DIAGNOSIS — M75101 Unspecified rotator cuff tear or rupture of right shoulder, not specified as traumatic: Secondary | ICD-10-CM

## 2019-05-26 DIAGNOSIS — M7521 Bicipital tendinitis, right shoulder: Secondary | ICD-10-CM

## 2019-05-26 MED ORDER — METOPROLOL TARTRATE 25 MG PO TABS: ORAL_TABLET | ORAL | 1 refills | Status: DC

## 2019-05-26 MED ORDER — OXYCODONE HCL 5 MG PO TABS: 5.0000 mg | ORAL_TABLET | Freq: Two times a day (BID) | ORAL | 0 refills | Status: DC | PRN

## 2019-05-26 NOTE — Telephone Encounter (Signed)
Last refilled: 04/10/2019 Last office visit: 04/10/2019

## 2019-05-26 NOTE — Telephone Encounter (Signed)
Cb# [phone] refill Oxycodone

## 2019-06-20 ENCOUNTER — Other Ambulatory Visit: Payer: Self-pay | Admitting: Family Medicine

## 2019-06-22 ENCOUNTER — Other Ambulatory Visit: Payer: Self-pay | Admitting: Family Medicine

## 2019-06-23 ENCOUNTER — Other Ambulatory Visit: Payer: Self-pay

## 2019-06-23 MED ORDER — LISINOPRIL 2.5 MG PO TABS: 2.5000 mg | ORAL_TABLET | Freq: Every day | ORAL | 2 refills | Status: DC

## 2019-06-23 NOTE — Telephone Encounter (Signed)
Lisinopril was last refilled by another Provider, Juan Ray called wanting it to be refilled.

## 2019-07-27 ENCOUNTER — Other Ambulatory Visit: Payer: Self-pay | Admitting: Family Medicine

## 2019-07-27 ENCOUNTER — Telehealth: Payer: Self-pay | Admitting: *Deleted

## 2019-07-27 DIAGNOSIS — G5711 Meralgia paresthetica, right lower limb: Secondary | ICD-10-CM

## 2019-07-27 DIAGNOSIS — M75101 Unspecified rotator cuff tear or rupture of right shoulder, not specified as traumatic: Secondary | ICD-10-CM

## 2019-07-27 DIAGNOSIS — G894 Chronic pain syndrome: Secondary | ICD-10-CM

## 2019-07-27 DIAGNOSIS — M7521 Bicipital tendinitis, right shoulder: Secondary | ICD-10-CM

## 2019-07-27 MED ORDER — PIOGLITAZONE HCL 30 MG PO TABS: 30.0000 mg | ORAL_TABLET | Freq: Every day | ORAL | 6 refills | Status: DC

## 2019-07-27 MED ORDER — OXYCODONE HCL 5 MG PO TABS: 5.0000 mg | ORAL_TABLET | Freq: Two times a day (BID) | ORAL | 0 refills | Status: DC | PRN

## 2019-07-27 MED ORDER — GABAPENTIN 600 MG PO TABS: ORAL_TABLET | ORAL | 6 refills | Status: DC

## 2019-07-27 NOTE — Telephone Encounter (Signed)
CB# [phone] Refill Gabapentin,Pioglitazone,Oxycodone

## 2019-07-27 NOTE — Telephone Encounter (Signed)
Prescription sent to pharmacy.   Oxycodone refilled in prior message.

## 2019-07-27 NOTE — Telephone Encounter (Signed)
Pt states he has been calling for 2 weeks and has not received a call back. He is completely out of his oxycodone. Last seen on 4//21. Uses the drug store in [place]

## 2019-08-17 ENCOUNTER — Other Ambulatory Visit: Payer: Self-pay | Admitting: Family Medicine

## 2019-08-21 ENCOUNTER — Other Ambulatory Visit: Payer: Self-pay | Admitting: Family Medicine

## 2019-09-28 ENCOUNTER — Other Ambulatory Visit: Payer: Self-pay | Admitting: Family Medicine

## 2019-09-29 ENCOUNTER — Other Ambulatory Visit: Payer: Self-pay | Admitting: Family Medicine

## 2019-09-29 DIAGNOSIS — M7521 Bicipital tendinitis, right shoulder: Secondary | ICD-10-CM

## 2019-09-29 DIAGNOSIS — M75101 Unspecified rotator cuff tear or rupture of right shoulder, not specified as traumatic: Secondary | ICD-10-CM

## 2019-09-29 DIAGNOSIS — G894 Chronic pain syndrome: Secondary | ICD-10-CM

## 2019-09-29 DIAGNOSIS — G5711 Meralgia paresthetica, right lower limb: Secondary | ICD-10-CM

## 2019-09-29 NOTE — Telephone Encounter (Signed)
CB# [phone] refill Oxycodone

## 2019-09-30 NOTE — Telephone Encounter (Signed)
Ok to refill??  Last office visit 04/07/2019.  Last refill 07/27/2019.

## 2019-10-01 MED ORDER — OXYCODONE HCL 5 MG PO TABS: 5.0000 mg | ORAL_TABLET | Freq: Two times a day (BID) | ORAL | 0 refills | Status: DC | PRN

## 2019-10-17 ENCOUNTER — Other Ambulatory Visit: Payer: Self-pay | Admitting: Family Medicine

## 2019-11-20 ENCOUNTER — Other Ambulatory Visit: Payer: Self-pay | Admitting: Family Medicine

## 2019-11-20 ENCOUNTER — Telehealth: Payer: Self-pay | Admitting: Family Medicine

## 2019-11-20 DIAGNOSIS — M75101 Unspecified rotator cuff tear or rupture of right shoulder, not specified as traumatic: Secondary | ICD-10-CM

## 2019-11-20 DIAGNOSIS — M7521 Bicipital tendinitis, right shoulder: Secondary | ICD-10-CM

## 2019-11-20 DIAGNOSIS — G5711 Meralgia paresthetica, right lower limb: Secondary | ICD-10-CM

## 2019-11-20 DIAGNOSIS — G894 Chronic pain syndrome: Secondary | ICD-10-CM

## 2019-11-20 MED ORDER — OXYCODONE HCL 5 MG PO TABS: 5.0000 mg | ORAL_TABLET | Freq: Two times a day (BID) | ORAL | 0 refills | Status: DC | PRN

## 2019-11-20 NOTE — Telephone Encounter (Signed)
Please advise 

## 2019-11-20 NOTE — Telephone Encounter (Signed)
Message sent thru MyChart 

## 2019-11-20 NOTE — Telephone Encounter (Signed)
Refill Oxycodone 

## 2019-11-20 NOTE — Telephone Encounter (Signed)
I will refill, but he needs ov before next refill.

## 2019-12-24 ENCOUNTER — Other Ambulatory Visit: Payer: Self-pay | Admitting: Family Medicine

## 2019-12-24 ENCOUNTER — Other Ambulatory Visit: Payer: Self-pay

## 2019-12-24 DIAGNOSIS — M75101 Unspecified rotator cuff tear or rupture of right shoulder, not specified as traumatic: Secondary | ICD-10-CM

## 2019-12-24 DIAGNOSIS — M7521 Bicipital tendinitis, right shoulder: Secondary | ICD-10-CM

## 2019-12-24 DIAGNOSIS — G894 Chronic pain syndrome: Secondary | ICD-10-CM

## 2019-12-24 DIAGNOSIS — G5711 Meralgia paresthetica, right lower limb: Secondary | ICD-10-CM

## 2019-12-24 MED ORDER — OXYCODONE HCL 5 MG PO TABS: 5.0000 mg | ORAL_TABLET | Freq: Two times a day (BID) | ORAL | 0 refills | Status: AC | PRN

## 2019-12-24 NOTE — Telephone Encounter (Signed)
Oxycodone refill

## 2019-12-24 NOTE — Telephone Encounter (Signed)
He needs ov.  Been over 7 months.

## 2020-01-04 ENCOUNTER — Other Ambulatory Visit: Payer: Self-pay | Admitting: Family Medicine

## 2020-01-19 ENCOUNTER — Other Ambulatory Visit: Payer: Self-pay | Admitting: Family Medicine

## 2020-01-19 DIAGNOSIS — I10 Essential (primary) hypertension: Secondary | ICD-10-CM

## 2020-03-10 ENCOUNTER — Other Ambulatory Visit: Payer: Self-pay

## 2020-03-10 DIAGNOSIS — M7521 Bicipital tendinitis, right shoulder: Secondary | ICD-10-CM

## 2020-03-10 DIAGNOSIS — M75101 Unspecified rotator cuff tear or rupture of right shoulder, not specified as traumatic: Secondary | ICD-10-CM

## 2020-03-10 DIAGNOSIS — G894 Chronic pain syndrome: Secondary | ICD-10-CM

## 2020-03-10 DIAGNOSIS — G5711 Meralgia paresthetica, right lower limb: Secondary | ICD-10-CM

## 2020-03-10 NOTE — Telephone Encounter (Signed)
Patient called need med refill oxyCODONE (OXY IR/ROXICODONE) 5 MG  Pharmacy: [facility], [address] [place] [address]

## 2020-03-11 NOTE — Addendum Note (Signed)
Addended by: Sheral Flow on: 03/11/2020 02:52 PM   Modules accepted: Orders

## 2020-03-11 NOTE — Telephone Encounter (Signed)
Appointment scheduled for Monday. 

## 2020-03-11 NOTE — Telephone Encounter (Signed)
Ok to refill??  Last office visit/ refill 12/24/2019.

## 2020-03-11 NOTE — Telephone Encounter (Signed)
NTBS.

## 2020-03-14 ENCOUNTER — Ambulatory Visit: Payer: Self-pay | Admitting: Family Medicine

## 2020-05-17 ENCOUNTER — Other Ambulatory Visit: Payer: Self-pay | Admitting: Family Medicine

## 2020-06-01 ENCOUNTER — Other Ambulatory Visit: Payer: Self-pay | Admitting: *Deleted

## 2020-06-01 ENCOUNTER — Telehealth: Payer: Self-pay | Admitting: Family Medicine

## 2020-06-01 ENCOUNTER — Other Ambulatory Visit: Payer: Self-pay

## 2020-06-01 DIAGNOSIS — E109 Type 1 diabetes mellitus without complications: Secondary | ICD-10-CM

## 2020-06-01 DIAGNOSIS — I5032 Chronic diastolic (congestive) heart failure: Secondary | ICD-10-CM

## 2020-06-01 DIAGNOSIS — E7800 Pure hypercholesterolemia, unspecified: Secondary | ICD-10-CM

## 2020-06-01 DIAGNOSIS — I10 Essential (primary) hypertension: Secondary | ICD-10-CM

## 2020-06-01 DIAGNOSIS — G5711 Meralgia paresthetica, right lower limb: Secondary | ICD-10-CM

## 2020-06-01 MED ORDER — LISINOPRIL 2.5 MG PO TABS: 2.5000 mg | ORAL_TABLET | Freq: Every day | ORAL | 0 refills | Status: DC

## 2020-06-01 MED ORDER — METFORMIN HCL 1000 MG PO TABS: 1.0000 | ORAL_TABLET | Freq: Two times a day (BID) | ORAL | 0 refills | Status: DC

## 2020-06-01 MED ORDER — PIOGLITAZONE HCL 30 MG PO TABS: ORAL_TABLET | ORAL | 0 refills | Status: DC

## 2020-06-01 MED ORDER — PANTOPRAZOLE SODIUM 40 MG PO TBEC: DELAYED_RELEASE_TABLET | ORAL | 0 refills | Status: DC

## 2020-06-01 MED ORDER — ATORVASTATIN CALCIUM 40 MG PO TABS: ORAL_TABLET | ORAL | 0 refills | Status: DC

## 2020-06-01 MED ORDER — GABAPENTIN 600 MG PO TABS: ORAL_TABLET | ORAL | 0 refills | Status: DC

## 2020-06-01 MED ORDER — METOPROLOL TARTRATE 25 MG PO TABS: ORAL_TABLET | ORAL | 0 refills | Status: DC

## 2020-06-01 NOTE — Telephone Encounter (Signed)
Call placed to patient.   Advised that he is due for a routine follow up and fasting labs as he has not been seen in >1 year.   Patient reports that he is currently without insurance, but would like a follow up for labs and refills.   Medication filled x1 with no refills. Appointment scheduled with PCP. Patient states that he is agreeable to $80 payment at time of service and to bill for the remaining balance if any. Patient agreeable to routine labs (CBC, CMP, Lipid, A1C, Urine Micro) and will await bill from [facility].   Requested letter stating that A1C obtained and pending result with prior A1C noted. Letter transcribed.

## 2020-06-01 NOTE — Telephone Encounter (Signed)
Medication filled x1 with no refills.   Requires office visit before any further refills can be given.  

## 2020-06-01 NOTE — Telephone Encounter (Signed)
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 Patient aware we don't do DOT physicals here. Patient called to ask if provider will order the A1C for him. Please advise at [phone].

## 2020-06-21 ENCOUNTER — Encounter: Payer: Self-pay | Admitting: Family Medicine

## 2020-06-21 ENCOUNTER — Ambulatory Visit: Payer: Self-pay | Admitting: Family Medicine

## 2020-06-27 ENCOUNTER — Telehealth: Payer: Self-pay | Admitting: *Deleted

## 2020-06-27 NOTE — Telephone Encounter (Signed)
Received call from patient [phone]~ telephone.   Reports that he received dismissal letter from [facility]. While he understands that multiple No Shows lead to dismissal, he is asking MD to reconsider.   States that one of the No Show appointments was due to incarceration and he could not call to cancel. Reports that the last No Show, he was court ordered into a class the day of the appointment and forgot to call.   Reports that he will pay No Show charges for past appointments.   Please advise.

## 2020-07-06 ENCOUNTER — Other Ambulatory Visit: Payer: Self-pay | Admitting: Family Medicine

## 2020-07-06 NOTE — Telephone Encounter (Signed)
Call placed to patient. No answer. No VM.   Message to be closed.

## 2020-07-26 ENCOUNTER — Other Ambulatory Visit: Payer: Self-pay | Admitting: Family Medicine

## 2020-07-27 ENCOUNTER — Other Ambulatory Visit: Payer: Self-pay | Admitting: Family Medicine

## 2020-07-27 DIAGNOSIS — G5711 Meralgia paresthetica, right lower limb: Secondary | ICD-10-CM

## 2020-08-12 ENCOUNTER — Telehealth: Payer: Self-pay

## 2020-08-15 ENCOUNTER — Telehealth: Payer: Self-pay | Admitting: Family Medicine

## 2020-08-15 DIAGNOSIS — M75101 Unspecified rotator cuff tear or rupture of right shoulder, not specified as traumatic: Secondary | ICD-10-CM

## 2020-08-15 DIAGNOSIS — M7521 Bicipital tendinitis, right shoulder: Secondary | ICD-10-CM

## 2020-08-15 DIAGNOSIS — G894 Chronic pain syndrome: Secondary | ICD-10-CM

## 2020-08-15 DIAGNOSIS — I10 Essential (primary) hypertension: Secondary | ICD-10-CM

## 2020-08-15 DIAGNOSIS — G5711 Meralgia paresthetica, right lower limb: Secondary | ICD-10-CM

## 2020-08-15 MED ORDER — LISINOPRIL 2.5 MG PO TABS: 2.5000 mg | ORAL_TABLET | Freq: Every day | ORAL | 0 refills | Status: DC

## 2020-08-15 MED ORDER — METOPROLOL TARTRATE 25 MG PO TABS: ORAL_TABLET | ORAL | 0 refills | Status: AC

## 2020-08-15 MED ORDER — ATORVASTATIN CALCIUM 40 MG PO TABS: ORAL_TABLET | ORAL | 0 refills | Status: DC

## 2020-08-15 MED ORDER — PANTOPRAZOLE SODIUM 40 MG PO TBEC: DELAYED_RELEASE_TABLET | ORAL | 0 refills | Status: AC

## 2020-08-15 MED ORDER — GABAPENTIN 600 MG PO TABS: ORAL_TABLET | ORAL | 0 refills | Status: AC

## 2020-08-15 MED ORDER — PIOGLITAZONE HCL 30 MG PO TABS: ORAL_TABLET | ORAL | 0 refills | Status: AC

## 2020-08-15 MED ORDER — METFORMIN HCL 1000 MG PO TABS: 1000.0000 mg | ORAL_TABLET | Freq: Two times a day (BID) | ORAL | 0 refills | Status: AC

## 2020-08-15 NOTE — Telephone Encounter (Signed)
Patient in process of transferring care to a different provider; waiting for records to be sent.  Patient is out of medication and requesting refills for the following meds: metFORMIN (GLUCOPHAGE) 1000 MG tablet [id number]  gabapentin (NEURONTIN) 600 MG tablet [id number] atorvastatin (LIPITOR) 40 MG tablet [id number]  lisinopril (ZESTRIL) 2.5 MG tablet [id number] metoprolol tartrate (LOPRESSOR) 25 MG tablet [id number]  pantoprazole (PROTONIX) 40 MG tablet [id number]  pioglitazone (ACTOS) 30 MG tablet [id number]  oxyCODONE (OXY IR/ROXICODONE) 5 MG immediate release tablet [id number]    Pharmacy confirmed as   [facility]  [place], [place] [address]  Phone:  [phone]  Fax:  [phone]   Patient stated he is  completely out of medication and blood sugar is high.   Please advise at [phone]

## 2020-08-15 NOTE — Telephone Encounter (Signed)
Routine medications filled for 30 day supply.   Patient has not been seen in >1 year. No narcotic will be filled.   No further refills will be given.

## 2020-08-15 NOTE — Telephone Encounter (Signed)
Patient stated he's going out of town tomorrow and will be gone for 7 days; hoping to receive refills beforehand.

## 2020-08-17 NOTE — Telephone Encounter (Signed)
Please see prior messages...

## 2020-08-22 ENCOUNTER — Telehealth: Payer: Self-pay | Admitting: Family Medicine

## 2020-08-22 NOTE — Telephone Encounter (Signed)
Encounter created in error. Please disregard.

## 2020-09-29 ENCOUNTER — Other Ambulatory Visit: Payer: Self-pay | Admitting: Family Medicine

## 2021-02-06 ENCOUNTER — Encounter: Payer: Self-pay | Admitting: Urology

## 2021-02-06 ENCOUNTER — Ambulatory Visit (INDEPENDENT_AMBULATORY_CARE_PROVIDER_SITE_OTHER): Payer: Self-pay | Admitting: Urology

## 2021-02-06 ENCOUNTER — Other Ambulatory Visit: Payer: Self-pay

## 2021-02-06 VITALS — BP 130/82 | HR 93 | Wt 241.0 lb

## 2021-02-06 DIAGNOSIS — N5201 Erectile dysfunction due to arterial insufficiency: Secondary | ICD-10-CM

## 2021-02-06 MED ORDER — PROSTAGLANDIN E1 POWD: 1.0000 mL | Freq: Every day | 0 refills | Status: DC | PRN

## 2021-02-06 NOTE — Progress Notes (Signed)
02/06/2021 1:26 PM   Juan Ray May 14, 1960 [id number]  Referring provider: Jani Gravel, MD [address] [place],  [place] [address]  No chief complaint on file.   HPI:  New pt -   1) ED - he has trouble getting and maintaining erection for several yrs. Tried Viagra and tadalafil. Not good results. Also tried VED. He gets nocturnal erections but they don't last long. He has a good libido.   He has kids - had a child at age 61. He has diabetes. He has PN. No nitroglycerin use. His PSA in 2018 was 0.5.   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    PMH: Past Medical History:  Diagnosis Date   Anxiety    Arthritis    CAD (coronary artery disease) 04/07/2012   a. Inferoposterior STEMI s/p DES-mid LCx. b. s/p CABG 04/2016.   Diabetes mellitus    Diabetic neuropathy (HCC)    GERD (gastroesophageal reflux disease)    High cholesterol    History of kidney stones    Hypertension    Ischemic cardiomyopathy    a. EF not totally clear - in 04/2016 was 35-40% by cath, 55-60% by echo, 40-45% by TEE all around same time.   Myocardial infarction [facility]) 2013   Neuropathy    Obesity    Pancreatitis    a. mild by CT 06/2011   Peptic ulcer disease    a. 06/2009 EGD: multiple gastric and duodenal ulcers.    Surgical History: Past Surgical History:  Procedure Laterality Date   BIOPSY  09/24/2017   Procedure: BIOPSY;  Surgeon: Danie Binder, MD;  [facility];  Service: Endoscopy;;  gastric bx's   COLONOSCOPY WITH PROPOFOL N/A 09/24/2017   Procedure: COLONOSCOPY WITH PROPOFOL;  Surgeon: Danie Binder, MD;  [facility];  Service: Endoscopy;  Laterality: N/A;  7:30am   CORONARY ANGIOPLASTY WITH STENT PLACEMENT  04/07/2012   70% prox LAD, 70-80% ostial diagonal, 70% mid-distal LAD, 80% prox OM1, mid LCx totally occluded just distal to OM1 s/p DES, occluded RCA; severe inferior HK, LVEF 45%   CORONARY ARTERY BYPASS GRAFT N/A 04/23/2016   Procedure:  CORONARY ARTERY BYPASS GRAFTING (CABG) x 4;  Surgeon: Gaye Pollack, MD;  [facility];  Service: Open Heart Surgery;  Laterality: N/A;   ENDOVEIN HARVEST OF GREATER SAPHENOUS VEIN Right 04/23/2016   Procedure: ENDOVEIN HARVEST OF GREATER SAPHENOUS VEIN;  Surgeon: Gaye Pollack, MD;  Location: [facility];  Service: Open Heart Surgery;  Laterality: Right;   ESOPHAGOGASTRODUODENOSCOPY  06/18/2011   multiple ulcers in proximal stomach with stigmata of bleeding s/p biopsy, multiple superficial ulcers in duodenal bulb. normal ampulla and second portion of duodenum   ESOPHAGOGASTRODUODENOSCOPY  2013   ESOPHAGOGASTRODUODENOSCOPY (EGD) WITH PROPOFOL N/A 09/24/2017   Procedure: ESOPHAGOGASTRODUODENOSCOPY (EGD) WITH PROPOFOL;  Surgeon: Danie Binder, MD;  [facility];  Service: Endoscopy;  Laterality: N/A;   INTRAVASCULAR PRESSURE WIRE/FFR STUDY N/A 04/13/2016   Procedure: Intravascular Pressure Wire/FFR Study;  Surgeon: Nelva Bush, MD;  Location: [facility] [facility];  Service: Cardiovascular;  Laterality: N/A;   LEFT HEART CATH AND CORONARY ANGIOGRAPHY N/A 04/13/2016   Procedure: Left Heart Cath and Coronary Angiography;  Surgeon: Nelva Bush, MD;  Location: [place] [facility];  Service: Cardiovascular;  Laterality: N/A;   LEFT HEART CATHETERIZATION WITH CORONARY ANGIOGRAM N/A 04/07/2012   Procedure: LEFT HEART CATHETERIZATION WITH CORONARY ANGIOGRAM;  Surgeon: Sherren Mocha, MD;  Location: [place] C[facility];  Service: Cardiovascular;  Laterality: N/A;   PERCUTANEOUS CORONARY STENT INTERVENTION (PCI-S)  04/07/2012   Procedure: PERCUTANEOUS CORONARY STENT INTERVENTION (PCI-S);  Surgeon: Sherren Mocha, MD;  Location: [facility] CATH LAB;  Service: Cardiovascular;;   POLYPECTOMY  09/24/2017   Procedure: POLYPECTOMY;  Surgeon: Danie Binder, MD;  [facility];  Service: Endoscopy;;  hepatic flexure polyp, transverse colon polyps x4   TEE WITHOUT CARDIOVERSION N/A 04/23/2016   Procedure:  TRANSESOPHAGEAL ECHOCARDIOGRAM (TEE);  Surgeon: Gaye Pollack, MD;  Location: [facility];  Service: Open Heart Surgery;  Laterality: N/A;    Home Medications:  Allergies as of 02/06/2021       Reactions   No Known Allergies         Medication List        Accurate as of February 06, 2021  1:26 PM. If you have any questions, ask your nurse or doctor.          aspirin 81 MG EC tablet Take 1 tablet (81 mg total) by mouth daily.   atorvastatin 40 MG tablet Commonly known as: LIPITOR TAKE ONE (1) TABLET EACH DAY   gabapentin 600 MG tablet Commonly known as: NEURONTIN TAKE ONE (1) TABLET THREE (3) TIMES EACH DAY   lisinopril 2.5 MG tablet Commonly known as: ZESTRIL Take 1 tablet (2.5 mg total) by mouth daily.   metFORMIN 1000 MG tablet Commonly known as: GLUCOPHAGE Take 1 tablet (1,000 mg total) by mouth 2 (two) times daily.   metoprolol tartrate 25 MG tablet Commonly known as: LOPRESSOR TAKE ONE TABLET BY MOUTH TWICE DAILY   oxyCODONE 5 MG immediate release tablet Commonly known as: Oxy IR/ROXICODONE Take 1 tablet (5 mg total) by mouth every 12 (twelve) hours as needed for severe pain. Chronic Pain. Dx: G89.4, G62.9   pantoprazole 40 MG tablet Commonly known as: PROTONIX TAKE ONE (1) TABLET EACH DAY   pioglitazone 30 MG tablet Commonly known as: ACTOS TAKE ONE (1) TABLET EACH DAY        Allergies:  Allergies  Allergen Reactions   No Known Allergies     Family History: Family History  Problem Relation Age of Onset   Heart attack Father        MI x 2 in late 3's, currently 9's   CVA Sister        late 63s   CAD Maternal Uncle        MI at 29   Colon cancer Neg Hx    Colon polyps Neg Hx     Social History:  reports that he quit smoking about 23 years ago. His smoking use included cigarettes. He has a 20.00 pack-year smoking history. He quit smokeless tobacco use about 23 years ago. He reports that he does not drink alcohol and does not use  drugs.   Physical Exam: BP 130/82    Pulse 93    Wt 241 lb (109.3 kg)    BMI 35.59 kg/m   Constitutional:  Alert and oriented, No acute distress. HEENT: [place] AT, moist mucus membranes.  Trachea midline, no masses. Cardiovascular: No clubbing, cyanosis, or edema. Respiratory: Normal respiratory effort, no increased work of breathing. GI: Abdomen is soft, nontender, nondistended, no abdominal masses GU: No CVA tenderness Lymph: No cervical or inguinal lymphadenopathy. Skin: No rashes, bruises or suspicious lesions. Neurologic: Grossly intact, no focal deficits, moving all 4 extremities. Psychiatric: Normal mood and affect. GU: Penis circumcised, normal foreskin, testicles descended bilaterally and  palpably normal, bilateral epididymis palpably normal, scrotum normal DRE: Prostate 30 g, smooth without hard area or nodule    Laboratory Data: Lab Results  Component Value Date   WBC 7.7 08/26/2018   HGB 15.4 08/26/2018   HCT 45.6 08/26/2018   MCV 95.4 08/26/2018   PLT 215 08/26/2018    Lab Results  Component Value Date   CREATININE 0.78 08/26/2018    Lab Results  Component Value Date   PSA 0.5 05/01/2016    No results found for: TESTOSTERONE  Lab Results  Component Value Date   HGBA1C 7.3 (H) 08/25/2018    Urinalysis    Component Value Date/Time   COLORURINE YELLOW 04/19/2016 1110   APPEARANCEUR CLEAR 04/19/2016 1110   LABSPEC 1.027 04/19/2016 1110   PHURINE 5.0 04/19/2016 1110   GLUCOSEU 50 (A) 04/19/2016 1110   HGBUR NEGATIVE 04/19/2016 1110   BILIRUBINUR NEGATIVE 04/19/2016 1110   KETONESUR NEGATIVE 04/19/2016 1110   PROTEINUR NEGATIVE 04/19/2016 1110   UROBILINOGEN 0.2 06/18/2011 0242   NITRITE NEGATIVE 04/19/2016 1110   LEUKOCYTESUR NEGATIVE 04/19/2016 1110    Lab Results  Component Value Date   LABMICR 6.3 05/01/2018   BACTERIA RARE 06/18/2011    Pertinent Imaging:   Results for orders placed during the hospital encounter of 07/28/15  CT Renal  Stone Study  Narrative CLINICAL DATA:  Left-sided abdominal pain EXAM: CT ABDOMEN AND PELVIS WITHOUT CONTRAST TECHNIQUE: Multidetector CT imaging of the abdomen and pelvis was performed following the standard protocol without IV contrast. COMPARISON:  06/19/2011 FINDINGS: Lower chest: Lung bases are free of acute infiltrate or sizable effusion. Coronary calcifications are noted. Hepatobiliary: Fatty infiltration of the liver without focal mass. The gallbladder is within normal limits. Pancreas: No mass or inflammatory process identified on this un-enhanced exam. Spleen: Within normal limits in size. Adrenals/Urinary Tract: Tiny nonobstructing left renal calculus. A small hypodensity is noted in the left mid kidney consistent with cysts seen on prior exam. No obstructive changes are noted. The bladder is partially distended. Stomach/Bowel: No evidence of obstruction, inflammatory process, or abnormal fluid collections. The appendix is within normal limits. Vascular/Lymphatic: Aortoiliac calcifications are seen without aneurysmal dilatation. No significant lymphadenopathy is noted. Reproductive: No mass or other significant abnormality. Other: None. Musculoskeletal: Bilateral pars defects are noted at L5 without significant anterolisthesis. No acute bony abnormality is seen. IMPRESSION: Tiny nonobstructing left renal stone. No obstructive changes are noted. Chronic changes as described above. Electronically Signed By: Inez Catalina M.D. On: 07/28/2015 17:24   Assessment & Plan:    ED -he has failed PDE 5 inhibitors and vacuum device.  I did refer him to [facility] sex therapy at least the podcast foreplay.  Also we went over the nature risk benefits and alternatives to penile injections.  All questions answered.  He will pick up prostaglandin 20 mcg/mL and follow-up for test dose.  Festus Aloe, MD  [facility]  [address] [place],  [place] [address] [phone]

## 2021-02-06 NOTE — Progress Notes (Signed)
Urological Symptom Review  Patient is experiencing the following symptoms: Erection problems (male only)   Review of Systems  Gastrointestinal (upper)  : Negative for upper GI symptoms  Gastrointestinal (lower) : Negative for lower GI symptoms  Constitutional : Negative for symptoms  Skin: Negative for skin symptoms  Eyes: Negative for eye symptoms  Ear/Nose/Throat : Sinus problems  Hematologic/Lymphatic: Negative for Hematologic/Lymphatic symptoms  Cardiovascular : Negative for cardiovascular symptoms  Respiratory : Negative for respiratory symptoms  Endocrine: Negative for endocrine symptoms  Musculoskeletal: Negative for musculoskeletal symptoms  Neurological: Negative for neurological symptoms  Psychologic: Negative for psychiatric symptoms

## 2021-02-07 LAB — PSA: Prostate Specific Ag, Serum: 0.9 ng/mL (ref 0.0–4.0)

## 2021-03-20 ENCOUNTER — Ambulatory Visit: Payer: Self-pay | Admitting: Urology

## 2021-04-12 ENCOUNTER — Telehealth: Payer: Self-pay

## 2021-04-12 ENCOUNTER — Other Ambulatory Visit: Payer: Self-pay

## 2021-04-12 DIAGNOSIS — N5201 Erectile dysfunction due to arterial insufficiency: Secondary | ICD-10-CM

## 2021-04-12 MED ORDER — PROSTAGLANDIN E1 POWD: 1.0000 mL | Freq: Every day | 0 refills | Status: DC | PRN

## 2021-04-12 NOTE — Telephone Encounter (Signed)
Medication sent to pharmacy as patient requested

## 2021-04-12 NOTE — Telephone Encounter (Signed)
Patient called and left vm 04/11/20 advising the pharmacy has not received his ED medication. He wanted to know if we could send it over again. ? ?Pharmacy: ?[place], [place] ?

## 2021-05-15 DIAGNOSIS — G894 Chronic pain syndrome: Secondary | ICD-10-CM | POA: Diagnosis not present

## 2021-06-01 ENCOUNTER — Emergency Department (HOSPITAL_COMMUNITY): Payer: 59

## 2021-06-01 ENCOUNTER — Encounter (HOSPITAL_COMMUNITY): Payer: Self-pay

## 2021-06-01 ENCOUNTER — Other Ambulatory Visit: Payer: Self-pay

## 2021-06-01 ENCOUNTER — Other Ambulatory Visit (HOSPITAL_COMMUNITY): Payer: Self-pay | Admitting: *Deleted

## 2021-06-01 ENCOUNTER — Observation Stay (HOSPITAL_BASED_OUTPATIENT_CLINIC_OR_DEPARTMENT_OTHER): Payer: 59

## 2021-06-01 ENCOUNTER — Observation Stay (HOSPITAL_COMMUNITY)
Admission: EM | Admit: 2021-06-01 | Discharge: 2021-06-02 | Disposition: A | Discharge status: Home / Self Care | Payer: 59 | Attending: Internal Medicine | Admitting: Internal Medicine

## 2021-06-01 DIAGNOSIS — Z87891 Personal history of nicotine dependence: Secondary | ICD-10-CM | POA: Diagnosis not present

## 2021-06-01 DIAGNOSIS — R072 Precordial pain: Secondary | ICD-10-CM | POA: Diagnosis not present

## 2021-06-01 DIAGNOSIS — R079 Chest pain, unspecified: Secondary | ICD-10-CM | POA: Diagnosis present

## 2021-06-01 DIAGNOSIS — I110 Hypertensive heart disease with heart failure: Secondary | ICD-10-CM | POA: Diagnosis not present

## 2021-06-01 DIAGNOSIS — I259 Chronic ischemic heart disease, unspecified: Secondary | ICD-10-CM

## 2021-06-01 DIAGNOSIS — I25110 Atherosclerotic heart disease of native coronary artery with unstable angina pectoris: Secondary | ICD-10-CM | POA: Diagnosis not present

## 2021-06-01 DIAGNOSIS — Z7984 Long term (current) use of oral hypoglycemic drugs: Secondary | ICD-10-CM | POA: Insufficient documentation

## 2021-06-01 DIAGNOSIS — I5043 Acute on chronic combined systolic (congestive) and diastolic (congestive) heart failure: Secondary | ICD-10-CM | POA: Diagnosis not present

## 2021-06-01 DIAGNOSIS — E782 Mixed hyperlipidemia: Secondary | ICD-10-CM

## 2021-06-01 DIAGNOSIS — I10 Essential (primary) hypertension: Secondary | ICD-10-CM

## 2021-06-01 DIAGNOSIS — I200 Unstable angina: Secondary | ICD-10-CM | POA: Diagnosis not present

## 2021-06-01 DIAGNOSIS — Z7982 Long term (current) use of aspirin: Secondary | ICD-10-CM | POA: Diagnosis not present

## 2021-06-01 DIAGNOSIS — I2510 Atherosclerotic heart disease of native coronary artery without angina pectoris: Secondary | ICD-10-CM | POA: Diagnosis present

## 2021-06-01 DIAGNOSIS — E1165 Type 2 diabetes mellitus with hyperglycemia: Secondary | ICD-10-CM

## 2021-06-01 DIAGNOSIS — Z951 Presence of aortocoronary bypass graft: Secondary | ICD-10-CM | POA: Insufficient documentation

## 2021-06-01 DIAGNOSIS — R0789 Other chest pain: Secondary | ICD-10-CM

## 2021-06-01 DIAGNOSIS — E1169 Type 2 diabetes mellitus with other specified complication: Secondary | ICD-10-CM | POA: Diagnosis not present

## 2021-06-01 DIAGNOSIS — E66812 Obesity, class 2: Secondary | ICD-10-CM

## 2021-06-01 DIAGNOSIS — Z79899 Other long term (current) drug therapy: Secondary | ICD-10-CM | POA: Diagnosis not present

## 2021-06-01 DIAGNOSIS — E669 Obesity, unspecified: Secondary | ICD-10-CM

## 2021-06-01 LAB — COMPREHENSIVE METABOLIC PANEL
ALT: 32 U/L (ref 0–44)
AST: 24 U/L (ref 15–41)
Albumin: 3.8 g/dL (ref 3.5–5.0)
Alkaline Phosphatase: 76 U/L (ref 38–126)
Anion gap: 4 — ABNORMAL LOW (ref 5–15)
BUN: 23 mg/dL (ref 8–23)
CO2: 29 mmol/L (ref 22–32)
Calcium: 9.1 mg/dL (ref 8.9–10.3)
Chloride: 103 mmol/L (ref 98–111)
Creatinine, Ser: 0.97 mg/dL (ref 0.61–1.24)
GFR, Estimated: >60 mL/min (ref >60)
Glucose, Bld: 186 mg/dL — ABNORMAL HIGH (ref 70–99)
Potassium: 4 mmol/L (ref 3.5–5.1)
Sodium: 136 mmol/L (ref 135–145)
Total Bilirubin: 0.9 mg/dL (ref 0.3–1.2)
Total Protein: 7.1 g/dL (ref 6.5–8.1)

## 2021-06-01 LAB — GLUCOSE, CAPILLARY
Glucose-Capillary: 107 mg/dL — ABNORMAL HIGH (ref 70–99)
Glucose-Capillary: 177 mg/dL — ABNORMAL HIGH (ref 70–99)

## 2021-06-01 LAB — CBC WITH DIFFERENTIAL/PLATELET
Abs Immature Granulocytes: 0.03 10*3/uL (ref 0.00–0.07)
Basophils Absolute: 0.1 10*3/uL (ref 0.0–0.1)
Basophils Relative: 1 %
Eosinophils Absolute: 0.4 10*3/uL (ref 0.0–0.5)
Eosinophils Relative: 4 %
HCT: 42.5 % (ref 39.0–52.0)
Hemoglobin: 14.8 g/dL (ref 13.0–17.0)
Immature Granulocytes: 0 %
Lymphocytes Relative: 29 %
Lymphs Abs: 2.4 10*3/uL (ref 0.7–4.0)
MCH: 32.7 pg (ref 26.0–34.0)
MCHC: 34.8 g/dL (ref 30.0–36.0)
MCV: 93.8 fL (ref 80.0–100.0)
Monocytes Absolute: 1.3 10*3/uL — ABNORMAL HIGH (ref 0.1–1.0)
Monocytes Relative: 16 %
Neutro Abs: 4.1 10*3/uL (ref 1.7–7.7)
Neutrophils Relative %: 50 %
Platelets: 236 10*3/uL (ref 150–400)
RBC: 4.53 MIL/uL (ref 4.22–5.81)
RDW: 12.9 % (ref 11.5–15.5)
WBC: 8.3 10*3/uL (ref 4.0–10.5)
nRBC: 0 % (ref 0.0–0.2)

## 2021-06-01 LAB — HIV ANTIBODY (ROUTINE TESTING W REFLEX): HIV Screen 4th Generation wRfx: NONREACTIVE

## 2021-06-01 LAB — ECHOCARDIOGRAM COMPLETE
AR max vel: 2.75 cm2
AV Area VTI: 2.61 cm2
AV Area mean vel: 2.38 cm2
AV Mean grad: 3.4 mmHg
AV Peak grad: 6.2 mmHg
Ao pk vel: 1.25 m/s
Area-P 1/2: 2.62 cm2
Height: 69 in
S' Lateral: 4.2 cm
Weight: 4320 oz

## 2021-06-01 LAB — HEPARIN LEVEL (UNFRACTIONATED)
Heparin Unfractionated: 0.14 IU/mL — ABNORMAL LOW (ref 0.30–0.70)
Heparin Unfractionated: <0.1 IU/mL — ABNORMAL LOW (ref 0.30–0.70)

## 2021-06-01 LAB — TROPONIN I (HIGH SENSITIVITY)
Troponin I (High Sensitivity): 25 ng/L — ABNORMAL HIGH (ref <18)
Troponin I (High Sensitivity): 18 ng/L — ABNORMAL HIGH (ref <18)

## 2021-06-01 LAB — HEMOGLOBIN A1C
Hgb A1c MFr Bld: 8.1 % — ABNORMAL HIGH (ref 4.8–5.6)
Mean Plasma Glucose: 185.77 mg/dL

## 2021-06-01 LAB — BRAIN NATRIURETIC PEPTIDE: B Natriuretic Peptide: 131 pg/mL — ABNORMAL HIGH (ref 0.0–100.0)

## 2021-06-01 MED ORDER — HEPARIN (PORCINE) 25000 UT/250ML-% IV SOLN
1700.0000 [IU]/h | INTRAVENOUS | Status: DC
  Administered 2021-06-01: 1200 [IU]/h via INTRAVENOUS
  Administered 2021-06-02: 1500 [IU]/h via INTRAVENOUS
  Filled 2021-06-01 (×2): qty 250

## 2021-06-01 MED ORDER — ACETAMINOPHEN 325 MG PO TABS: 650.0000 mg | ORAL_TABLET | Freq: Four times a day (QID) | ORAL | Status: DC | PRN

## 2021-06-01 MED ORDER — HEPARIN BOLUS VIA INFUSION
4000.0000 [IU] | Freq: Once | INTRAVENOUS | Status: AC
  Administered 2021-06-01: 4000 [IU] via INTRAVENOUS

## 2021-06-01 MED ORDER — HEPARIN BOLUS VIA INFUSION
3000.0000 [IU] | Freq: Once | INTRAVENOUS | Status: AC
  Administered 2021-06-01: 3000 [IU] via INTRAVENOUS
  Filled 2021-06-01: qty 3000

## 2021-06-01 MED ORDER — INSULIN ASPART 100 UNIT/ML IJ SOLN
0.0000 [IU] | Freq: Three times a day (TID) | INTRAMUSCULAR | Status: DC
  Administered 2021-06-02: 2 [IU] via SUBCUTANEOUS

## 2021-06-01 MED ORDER — ONDANSETRON HCL 4 MG PO TABS: 4.0000 mg | ORAL_TABLET | Freq: Four times a day (QID) | ORAL | Status: DC | PRN

## 2021-06-01 MED ORDER — ACETAMINOPHEN 650 MG RE SUPP: 650.0000 mg | Freq: Four times a day (QID) | RECTAL | Status: DC | PRN

## 2021-06-01 MED ORDER — GABAPENTIN 300 MG PO CAPS
600.0000 mg | ORAL_CAPSULE | Freq: Three times a day (TID) | ORAL | Status: DC
  Administered 2021-06-01 – 2021-06-02 (×3): 600 mg via ORAL
  Filled 2021-06-01 (×3): qty 2

## 2021-06-01 MED ORDER — INSULIN ASPART 100 UNIT/ML IJ SOLN: 0.0000 [IU] | Freq: Every day | INTRAMUSCULAR | Status: DC

## 2021-06-01 MED ORDER — ASPIRIN 81 MG PO TBEC
81.0000 mg | DELAYED_RELEASE_TABLET | Freq: Every day | ORAL | Status: DC
  Administered 2021-06-02: 81 mg via ORAL
  Filled 2021-06-01: qty 1

## 2021-06-01 MED ORDER — PERFLUTREN LIPID MICROSPHERE
1.0000 mL | INTRAVENOUS | Status: AC | PRN
  Administered 2021-06-01: 4 mL via INTRAVENOUS

## 2021-06-01 MED ORDER — NITROGLYCERIN 2 % TD OINT
1.0000 [in_us] | TOPICAL_OINTMENT | Freq: Once | TRANSDERMAL | Status: AC
  Administered 2021-06-01: 1 [in_us] via TOPICAL
  Filled 2021-06-01: qty 1

## 2021-06-01 MED ORDER — ATORVASTATIN CALCIUM 40 MG PO TABS
40.0000 mg | ORAL_TABLET | Freq: Every day | ORAL | Status: DC
  Administered 2021-06-01: 40 mg via ORAL
  Filled 2021-06-01: qty 1

## 2021-06-01 MED ORDER — ONDANSETRON HCL 4 MG/2ML IJ SOLN: 4.0000 mg | Freq: Four times a day (QID) | INTRAMUSCULAR | Status: DC | PRN

## 2021-06-01 MED ORDER — PANTOPRAZOLE SODIUM 40 MG PO TBEC
40.0000 mg | DELAYED_RELEASE_TABLET | Freq: Every day | ORAL | Status: DC
  Administered 2021-06-02: 40 mg via ORAL
  Filled 2021-06-01: qty 1

## 2021-06-01 MED ORDER — LISINOPRIL 5 MG PO TABS
2.5000 mg | ORAL_TABLET | Freq: Every day | ORAL | Status: DC
  Administered 2021-06-02: 2.5 mg via ORAL
  Filled 2021-06-01: qty 1

## 2021-06-01 MED ORDER — NITROGLYCERIN 0.4 MG SL SUBL
0.4000 mg | SUBLINGUAL_TABLET | Freq: Once | SUBLINGUAL | Status: AC
  Administered 2021-06-01: 0.4 mg via SUBLINGUAL
  Filled 2021-06-01: qty 1

## 2021-06-01 MED ORDER — FUROSEMIDE 10 MG/ML IJ SOLN
40.0000 mg | Freq: Once | INTRAMUSCULAR | Status: AC
Start: 2021-06-01 — End: 2021-06-01
  Administered 2021-06-01: 40 mg via INTRAVENOUS
  Filled 2021-06-01: qty 4

## 2021-06-01 MED ORDER — METOPROLOL TARTRATE 25 MG PO TABS
25.0000 mg | ORAL_TABLET | Freq: Two times a day (BID) | ORAL | Status: DC
  Administered 2021-06-01 – 2021-06-02 (×2): 25 mg via ORAL
  Filled 2021-06-01 (×2): qty 1

## 2021-06-01 NOTE — Hospital Course (Signed)
61 year old male with a history of coronary artery disease with STEMI 2014 and DES to the left circumflex, diabetes mellitus type 2, hypertension, hyperlipidemia presenting with substernal chest pain that woke him up in the morning of 06/01/2021.  The patient states that he has had some intermittent chest discomfort on and off for the past several months lasting about 15 minutes.  There is no alleviating or aggravating factors that he can recall.  In addition, the patient relates that he has been having some increased dyspnea on exertion as well.  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  He intermittently takes his mom's furosemide when his leg edema worsens.  After waking up with chest discomfort, the patient took 2 nitroglycerin with relief.  However, his chest discomfort returned after about 5 to 10 minutes.  In the emergency department, the patient was placed on a nitroglycerin patch with relief of his chest discomfort.  He denies any frank orthopnea or PND. Patient denies fevers, chills, headache,nausea, vomiting, diarrhea, abdominal pain, dysuria, hematuria, hematochezia, and melena. In the ED, the patient was afebrile and hemodynamically stable with oxygen saturation 99% on room air.  BMP showed sodium 136, potassium 4.0, bicarbonate 29, serum creatinine 0.97.  LFTs were unremarkable.  WBC 8.3, hemoglobin 14.8, platelets 230,000.  MP was 131.  Troponin 25>> 18.  Chest x-ray showed pulmonary vascular congestion.  BNP 131.  Cardiology was consulted.  The patient was started on a heparin drip.  He was given a dose of IV furosemide 40 mg IV x1.

## 2021-06-01 NOTE — Consult Note (Signed)
Cardiology Consultation:   Patient ID: Juan Ray MRN: [id number]; DOB: 02/05/1960  Admit date: 06/01/2021 Date of Consult: 06/01/2021  PCP:  [name], [facility] Cardiologist:  Sherren Mocha, MD        Patient Profile:   Juan Ray is a 61 y.o. male with a hx of CAD (s/p prior PCI to LCx and underwent CABG in 04/2016 with LIMA-LAD, seq SVG-OM1-OM2 and SVG-RCA), HFimpEF (variable reports as EF 35-40% by cath in 04/2016 and 55-60% by TTE but 40-45% by TEE at time of CABG), HTN, HLD and Type 2 DM who is being seen 06/01/2021 for the evaluation of chest pain at the request of Dr. [name].  History of Present Illness:   Juan Ray was last examined by Juan Lis, PA in 09/2018 and denied any recent anginal symptoms at that time. Was continued on ASA, statin and BB therapy with plans to follow-up in 1 year but has not been evaluated by Cardiology since.   He presented to [facility] ED this morning for evaluation of chest pain which radiated down his right arm. 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  Says that he has noticed more dyspnea on exertion within the past year but no acute changes in this. Last night, he noticed a numbness sensation along his right hand which he reports was not overall atypical from his usual neuropathy but seemed to persist for a little longer. This morning, he reports having a stabbing discomfort along his sternal region which radiated into his back. He did take 2 SL NTG at home with improvement but had recurrent pain which prompted him to seek evaluation. He was placed on nitroglycerin patch and says that his pain has significantly improved at this time. Says his pain overall persisted for several hours and was not worse with exertion or positional changes.  He denies any recent orthopnea or PND. He  does experience occasional lower extremity edema and says he takes his mother's Lasix intermittently with improvement in symptoms.   Initial labs show WBC 8.3, Hgb 14.8, platelets 236, Na+ 136, K+ 4.0 and creatinine 0.97. Initial Hs Troponin 25 with repeat pending. BNP pending. CXR shows cardiomegaly with mild pulmonary vascular congestion without frank edema. EKG shows NSR, HR 70 with inferior infarct pattern and incomplete RBBB.   Past Medical History:  Diagnosis Date   Anxiety    Arthritis    CAD (coronary artery disease) 04/07/2012   a. Inferoposterior STEMI s/p DES-mid LCx. b. s/p CABG 04/2016.   Diabetes mellitus    Diabetic neuropathy (HCC)    GERD (gastroesophageal reflux disease)    High cholesterol    History of kidney stones    Hypertension    Ischemic cardiomyopathy    a. EF not totally clear - in 04/2016 was 35-40% by cath, 55-60% by echo, 40-45% by TEE all around same time.   Myocardial infarction [facility]) 2013   Neuropathy    Obesity    Pancreatitis    a. mild by CT 06/2011   Peptic ulcer disease    a. 06/2009 EGD: multiple gastric and duodenal ulcers.    Past Surgical History:  Procedure Laterality Date   BIOPSY  09/24/2017   Procedure: BIOPSY;  Surgeon: Danie Binder, MD;  [facility];  Service: Endoscopy;;  gastric bx's  COLONOSCOPY WITH PROPOFOL N/A 09/24/2017   Procedure: COLONOSCOPY WITH PROPOFOL;  Surgeon: Danie Binder, MD;  [facility];  Service: Endoscopy;  Laterality: N/A;  7:30am   CORONARY ANGIOPLASTY WITH STENT PLACEMENT  04/07/2012   70% prox LAD, 70-80% ostial diagonal, 70% mid-distal LAD, 80% prox OM1, mid LCx totally occluded just distal to OM1 s/p DES, occluded RCA; severe inferior HK, LVEF 45%   CORONARY ARTERY BYPASS GRAFT N/A 04/23/2016   Procedure: CORONARY ARTERY BYPASS GRAFTING (CABG) x 4;  Surgeon: Gaye Pollack, MD;  Location: [place] [facility];  Service: Open Heart Surgery;  Laterality: N/A;   ENDOVEIN HARVEST OF GREATER SAPHENOUS  VEIN Right 04/23/2016   Procedure: ENDOVEIN HARVEST OF GREATER SAPHENOUS VEIN;  Surgeon: Gaye Pollack, MD;  Location: [place];  Service: Open Heart Surgery;  Laterality: Right;   ESOPHAGOGASTRODUODENOSCOPY  06/18/2011   multiple ulcers in proximal stomach with stigmata of bleeding s/p biopsy, multiple superficial ulcers in duodenal bulb. normal ampulla and second portion of duodenum   ESOPHAGOGASTRODUODENOSCOPY  2013   ESOPHAGOGASTRODUODENOSCOPY (EGD) WITH PROPOFOL N/A 09/24/2017   Procedure: ESOPHAGOGASTRODUODENOSCOPY (EGD) WITH PROPOFOL;  Surgeon: Danie Binder, MD;  [facility];  Service: Endoscopy;  Laterality: N/A;   INTRAVASCULAR PRESSURE WIRE/FFR STUDY N/A 04/13/2016   Procedure: Intravascular Pressure Wire/FFR Study;  Surgeon: Nelva Bush, MD;  Location: [place] [facility];  Service: Cardiovascular;  Laterality: N/A;   LEFT HEART CATH AND CORONARY ANGIOGRAPHY N/A 04/13/2016   Procedure: Left Heart Cath and Coronary Angiography;  Surgeon: Nelva Bush, MD;  Location: [place] [facility];  Service: Cardiovascular;  Laterality: N/A;   LEFT HEART CATHETERIZATION WITH CORONARY ANGIOGRAM N/A 04/07/2012   Procedure: LEFT HEART CATHETERIZATION WITH CORONARY ANGIOGRAM;  Surgeon: Sherren Mocha, MD;  Location: [facility] C[facility];  Service: Cardiovascular;  Laterality: N/A;   PERCUTANEOUS CORONARY STENT INTERVENTION (PCI-S)  04/07/2012   Procedure: PERCUTANEOUS CORONARY STENT INTERVENTION (PCI-S);  Surgeon: Sherren Mocha, MD;  Location: [facility] LAB;  Service: Cardiovascular;;   POLYPECTOMY  09/24/2017   Procedure: POLYPECTOMY;  Surgeon: Danie Binder, MD;  [facility];  Service: Endoscopy;;  hepatic flexure polyp, transverse colon polyps x4   TEE WITHOUT CARDIOVERSION N/A 04/23/2016   Procedure: TRANSESOPHAGEAL ECHOCARDIOGRAM (TEE);  Surgeon: Gaye Pollack, MD;  Location: [place];  Service: Open Heart Surgery;  Laterality: N/A;     Home Medications:  Prior to Admission medications    Medication Sig Start Date End Date Taking? Authorizing Provider  Alprostadil (PROSTAGLANDIN E1) POWD 1 mL by Intracavernosal route daily as needed. 04/12/21   Festus Aloe, MD  aspirin EC 81 MG EC tablet Take 1 tablet (81 mg total) by mouth daily. 04/09/12   Arguello, Roger A, PA-C  atorvastatin (LIPITOR) 40 MG tablet TAKE ONE (1) TABLET EACH DAY 08/15/20   Susy Frizzle, MD  gabapentin (NEURONTIN) 600 MG tablet TAKE ONE (1) TABLET THREE (3) TIMES EACH DAY 08/15/20   Susy Frizzle, MD  lisinopril (ZESTRIL) 2.5 MG tablet Take 1 tablet (2.5 mg total) by mouth daily. 08/15/20   Susy Frizzle, MD  metFORMIN (GLUCOPHAGE) 1000 MG tablet Take 1 tablet (1,000 mg total) by mouth 2 (two) times daily. 08/15/20   Susy Frizzle, MD  metoprolol tartrate (LOPRESSOR) 25 MG tablet TAKE ONE TABLET BY MOUTH TWICE DAILY 08/15/20   Susy Frizzle, MD  oxyCODONE (OXY IR/ROXICODONE) 5 MG immediate release tablet Take 1 tablet (5 mg total) by mouth every 12 (  twelve) hours as needed for severe pain. Chronic Pain. Dx: G89.4, G62.9 12/24/19   Susy Frizzle, MD  pantoprazole (PROTONIX) 40 MG tablet TAKE ONE (1) TABLET EACH DAY 08/15/20   Susy Frizzle, MD  pioglitazone (ACTOS) 30 MG tablet TAKE ONE (1) TABLET EACH DAY 08/15/20   Susy Frizzle, MD    Inpatient Medications: Scheduled Meds:  Continuous Infusions:  PRN Meds:   Allergies:    Allergies  Allergen Reactions   No Known Allergies     Social History:   Social History   Socioeconomic History   Marital status: Married    Spouse name: Not on file   Number of children: 2   Years of education: Not on file   Highest education level: Not on file  Occupational History   Occupation: truck driver  Tobacco Use   Smoking status: Former    Packs/day: 1.00    Years: 20.00    Pack years: 20.00    Types: Cigarettes    Quit date: 2000    Years since quitting: 23.4   Smokeless tobacco: Former    Quit date: 01/01/1998  Vaping Use    Vaping Use: Never used  Substance and Sexual Activity   Alcohol use: No   Drug use: No   Sexual activity: Yes  Other Topics Concern   Not on file  Social History Narrative   Lives in [place], with his wife and 2 children   Social Determinants of Health   Financial Resource Strain: Not on file  Food Insecurity: Not on file  Transportation Needs: Not on file  Physical Activity: Not on file  Stress: Not on file  Social Connections: Not on file  Intimate Partner Violence: Not on file    Family History:    Family History  Problem Relation Age of Onset   Heart attack Father        MI x 2 in late 50's, currently 47's   CVA Sister        late 27s   CAD Maternal Uncle        MI at 90   Colon cancer Neg Hx    Colon polyps Neg Hx      ROS:  Please see the history of present illness.   All other ROS reviewed and negative.     Physical Exam/Data:   Vitals:   06/01/21 1019 06/01/21 1035 06/01/21 1100 06/01/21 1130  BP:  (!) 163/88 122/79 121/81  Pulse:  69 70 68  Resp:  '15 12 11  '$ Temp:      TempSrc:      SpO2:  98% 95% 97%  Weight: 122.5 kg     Height: '5\' 9"'$  (1.753 m)      No intake or output data in the 24 hours ending 06/01/21 1222    06/01/2021   10:19 AM 02/06/2021    1:22 PM 04/07/2019    2:23 PM  Last 3 Weights  Weight (lbs) 270 lb 241 lb 241 lb  Weight (kg) 122.471 kg 109.317 kg 109.317 kg     Body mass index is 39.87 kg/m.  General: Pleasant obese male appearing in no acute distress. HEENT: normal Neck: no JVD Vascular: No carotid bruits; Distal pulses 2+ bilaterally Cardiac:  normal S1, S2; RRR; no murmur. Lungs:  clear to auscultation bilaterally, no wheezing, rhonchi or rales  Abd: soft, nontender, no hepatomegaly  Ext: 1+  lower extremity edema.  Musculoskeletal:  No deformities, BUE and BLE strength  normal and equal Skin: warm and dry  Neuro:  CNs 2-12 intact, no focal abnormalities noted Psych:  Normal affect   EKG:  The EKG was personally  reviewed and demonstrates: NSR, HR 70 with inferior infarct pattern and incomplete RBBB.   Telemetry:  Telemetry was personally reviewed and demonstrates: NSR, HR in 70's to 80's.   Relevant CV Studies:  LHC: 04/2016 Conclusions: Significant 3-vessel coronary artery disease, including sequential 50-70% ostial, proximal, and mid LAD stenoses, which are hemodynamically significant (resting Pd/Pa 0.72), 50% in-stent restenosis in mid LCx as well as 80% 90% stenoses involving OM2 and distal LCx, and chronic total occlusion of mid RCA with distal vessel filling via bridging and left-to-right collaterals. Upper normal left ventricular filling pressure. Moderately reduced left ventricular contraction with inferior akinesis (LVEF 35-40%).   Recommendations: Cardiac surgery consultation for CABG, given significant 3-vessel coronary artery disease, reduced LV function, and diabetes mellitus. Aggressive secondary prevention, including escalation of statin therapy. Start isosorbide mononitrate 30 mg daily; if patient experiences side effects, he can decrease the dose to 15 mg daily. Proceed with transthoracic echocardiogram, as previously ordered.  Echo: 04/2016 Study Conclusions   - Left ventricle: The cavity size was normal. There was moderate    concentric hypertrophy. Systolic function was normal. The    estimated ejection fraction was in the range of 55% to 60%. There    is akinesis of the inferior myocardium. Doppler parameters are    consistent with abnormal left ventricular relaxation (grade 1    diastolic dysfunction).  - Aortic valve: Trileaflet; mildly thickened, moderately calcified    leaflets.   Laboratory Data:  High Sensitivity Troponin:   Recent Labs  Lab 06/01/21 1000  TROPONINIHS 25*     Chemistry Recent Labs  Lab 06/01/21 1000  NA 136  K 4.0  CL 103  CO2 29  GLUCOSE 186*  BUN 23  CREATININE 0.97  CALCIUM 9.1  GFRNONAA >60  ANIONGAP 4*    Recent Labs  Lab  06/01/21 1000  PROT 7.1  ALBUMIN 3.8  AST 24  ALT 32  ALKPHOS 76  BILITOT 0.9   Lipids No results for input(s): CHOL, TRIG, HDL, LABVLDL, LDLCALC, CHOLHDL in the last 168 hours.  Hematology Recent Labs  Lab 06/01/21 1000  WBC 8.3  RBC 4.53  HGB 14.8  HCT 42.5  MCV 93.8  MCH 32.7  MCHC 34.8  RDW 12.9  PLT 236   Thyroid No results for input(s): TSH, FREET4 in the last 168 hours.  BNP Recent Labs  Lab 06/01/21 1035  BNP 131.0*    DDimer No results for input(s): DDIMER in the last 168 hours.   Radiology/Studies:  DG Chest Port 1 View  Result Date: 06/01/2021 CLINICAL DATA:  Centralized chest pain extending in the right upper extremity. EXAM: PORTABLE CHEST 1 VIEW COMPARISON:  One-view chest x-ray 04/07/2019 FINDINGS: Patient is status post median sternotomy. Heart is enlarged. Mild pulmonary vascular congestion is present. No edema or effusion is present. No focal airspace consolidation is present. Degenerative changes are noted in the shoulders. IMPRESSION: Cardiomegaly and mild pulmonary vascular congestion without frank edema. Electronically Signed   By: San Morelle M.D.   On: 06/01/2021 10:58     Assessment and Plan:   1. Chest Pain with Mixed Features - His chest pain is a stabbing discomfort along his sternal region which has been persistent for 4 hours but has improved intermittently with nitroglycerin and has significantly improved with placement of a  nitroglycerin patch. Initial Hs Troponin 25 with repeat values pending. EKG shows evidence of prior inferior infarct with incomplete RBBB but no acute ST changes. - At this time, would await repeat troponin to help guide further management. We will also obtain a repeat echocardiogram for reassessment of his EF and wall motion. If enzymes trend upwards or echocardiogram is with significant abnormalities, would anticipate a repeat cardiac catheterization for definitive evaluation. Would also start Heparin if enzymes  trend upwards or he has recurrent pain. - Continue PTA ASA 81 mg daily, Atorvastatin 40 mg daily and Lopressor 25 mg twice daily.   2. HFimpEF - Assessment of his EF was variable in 2018 as this was at 35 to 40% by cardiac catheterization but at 55 to 60% by TTE. Will plan to obtain a repeat echocardiogram. He does have lower extremity edema on examination today and CXR shows evidence of vascular congestion. BNP pending. Will dose IV Lasix 40 mg x 1 and assess response. May need titration of GDMT pending echocardiogram results. Remains on Lopressor and Lisinopril at this time.  3. CAD - He is s/p prior PCI to LCx and underwent CABG in 04/2016 with LIMA-LAD, seq SVG-OM1-OM2 and SVG-RCA. Plan for repeat evaluation as outlined above. - Continue ASA 81 mg daily, Atorvastatin 40 mg daily, Lisinopril 2.5 mg daily and Lopressor 25 mg twice daily.  4. HTN - BP initially recorded at 177/89, improved to 121/81 after having received nitroglycerin. Continue PTA Lisinopril and Lopressor.   5. HLD - Recheck FLP. Continue Atorvastatin '40mg'$  daily.    Risk Assessment/Risk Scores:     HEAR Score (for undifferentiated chest pain):  HEAR Score: 5   For questions or updates, please contact [facility] Please consult [email or web address] for contact info under    Signed, Erma Heritage, PA-C  06/01/2021 12:22 PM  Patient seen and discussed with PA Ahmed Prima, I agree with her documentation. 61 yo male history of CAD with STEMI in 2014 receiving DES  to LCX, CABG 2018 with LIMA-LAD, SVG-OM1 and OM2, SVG-RCA. History of HTN, HL, DM2,      ER vitals 177/89 HR 71 99% RA WBC 8.3 Hgb 14.8 Plt 236 K 4 Cr 0.97 BUN 23 BNP 131 CXR cardiomegaly, mild congestion EKG SR, RBBB, chronic nonspecific lateral ST/T changes Trop 25-->18   04/2016 echo: LVEF 55-60%, inferior akinesis, grade I dd     1.CAD/Chest pain Mild troponin downtrending, EKG without specific ischemic changes - chest pain atypical in the sense  lasted constant for several hours, though did improve with NG patch.  - f/u echo, trop trends, EKG. Has some fluid overload, mild trop and chest discomfort could be related to volume overload which would also improve with NG.  - npo at midnight in case ischemic testing indicated. If unremarkable initial workup and symptoms improve with diuresis alone may not require inpatient ischemic testing. With BMI 40 and prior CABG could consider outpatient lexiscan 2 day protocol vs cardiac PET stress which may be better option if feasible for patient.   2. Acute on chronic HFimpEF - bilateral LE edema, mildly elevated BNP perhaps understated in setting of HFrEF and obesity. Agree with IV diuresis today. ReDS vest ordered, f/u that data as its available   Carlyle Dolly MD

## 2021-06-01 NOTE — Assessment & Plan Note (Signed)
BMI 39.87 Lifestyle modification 

## 2021-06-01 NOTE — ED Triage Notes (Addendum)
Patient arrived with complaints of central chest pain that radiates down right arm since 0800. Patient states it feels like a knife is going through his back. Per patient hx of MI. Patient states he took 2 SL nitro and 2 '81mg'$  ASA at home with some relief.

## 2021-06-01 NOTE — Assessment & Plan Note (Signed)
Continue metoprolol tartrate 25 mg twice daily

## 2021-06-01 NOTE — H&P (Signed)
History and Physical    Patient: Juan Ray ZDG:[id number] DOB: August 29, 1960 DOA: 06/01/2021 DOS: the patient was seen and examined on 06/01/2021 PCP: [facility], [facility]  Patient coming from: Home  Chief Complaint:  Chief Complaint  Patient presents with   Chest Pain   HPI: Juan Ray is 61 year old male with a history of coronary artery disease with STEMI 2014 and DES to the left circumflex, diabetes mellitus type 2, hypertension, hyperlipidemia presenting with substernal chest pain that woke him up in the morning of 06/01/2021.  The patient states that he has had some intermittent chest discomfort on and off for the past several months lasting about 15 minutes.  There is no alleviating or aggravating factors that he can recall.  In addition, the patient relates that he has been having some increased dyspnea on exertion as well.  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  He intermittently takes his mom's furosemide when his leg edema worsens.  After waking up with chest discomfort, the patient took 2 nitroglycerin with relief.  However, his chest discomfort returned after about 5 to 10 minutes.  In the emergency department, the patient was placed on a nitroglycerin patch with relief of his chest discomfort.  He denies any frank orthopnea or PND. Patient denies fevers, chills, headache,nausea, vomiting, diarrhea, abdominal pain, dysuria, hematuria, hematochezia, and melena. In the ED, the patient was afebrile and hemodynamically stable with oxygen saturation 99% on room air.  BMP showed sodium 136, potassium 4.0, bicarbonate 29, serum creatinine 0.97.  LFTs were unremarkable.  WBC 8.3, hemoglobin 14.8, platelets 230,000.  MP was 131.  Troponin 25>> 18.  Chest x-ray showed pulmonary vascular congestion.  BNP 131.  Cardiology was consulted.  The patient was started on a heparin drip.  He was given a dose of IV furosemide 40  mg IV x1.    Review of Systems: As mentioned in the history of present illness. All other systems reviewed and are negative. Past Medical History:  Diagnosis Date   Anxiety    Arthritis    CAD (coronary artery disease) 04/07/2012   a. Inferoposterior STEMI s/p DES-mid LCx. b. s/p CABG 04/2016.   Diabetes mellitus    Diabetic neuropathy (HCC)    GERD (gastroesophageal reflux disease)    High cholesterol    History of kidney stones    Hypertension    Ischemic cardiomyopathy    a. EF not totally clear - in 04/2016 was 35-40% by cath, 55-60% by echo, 40-45% by TEE all around same time.   Myocardial infarction [facility]) 2013   Neuropathy    Obesity    Pancreatitis    a. mild by CT 06/2011   Peptic ulcer disease    a. 06/2009 EGD: multiple gastric and duodenal ulcers.   Past Surgical History:  Procedure Laterality Date   BIOPSY  09/24/2017   Procedure: BIOPSY;  Surgeon: Danie Binder, MD;  [facility];  Service: Endoscopy;;  gastric bx's   COLONOSCOPY WITH PROPOFOL N/A 09/24/2017   Procedure: COLONOSCOPY WITH PROPOFOL;  Surgeon: Danie Binder, MD;  [facility];  Service: Endoscopy;  Laterality: N/A;  7:30am   CORONARY ANGIOPLASTY WITH STENT PLACEMENT  04/07/2012   70% prox LAD, 70-80% ostial diagonal, 70% mid-distal LAD, 80% prox OM1, mid LCx totally occluded just distal to OM1 s/p DES, occluded RCA; severe inferior HK, LVEF 45%   CORONARY ARTERY BYPASS GRAFT  N/A 04/23/2016   Procedure: CORONARY ARTERY BYPASS GRAFTING (CABG) x 4;  Surgeon: Gaye Pollack, MD;  Location: [place] OR;  Service: Open Heart Surgery;  Laterality: N/A;   ENDOVEIN HARVEST OF GREATER SAPHENOUS VEIN Right 04/23/2016   Procedure: ENDOVEIN HARVEST OF GREATER SAPHENOUS VEIN;  Surgeon: Gaye Pollack, MD;  Location: [place];  Service: Open Heart Surgery;  Laterality: Right;   ESOPHAGOGASTRODUODENOSCOPY  06/18/2011   multiple ulcers in proximal stomach with stigmata of bleeding s/p biopsy, multiple  superficial ulcers in duodenal bulb. normal ampulla and second portion of duodenum   ESOPHAGOGASTRODUODENOSCOPY  2013   ESOPHAGOGASTRODUODENOSCOPY (EGD) WITH PROPOFOL N/A 09/24/2017   Procedure: ESOPHAGOGASTRODUODENOSCOPY (EGD) WITH PROPOFOL;  Surgeon: Danie Binder, MD;  [facility];  Service: Endoscopy;  Laterality: N/A;   INTRAVASCULAR PRESSURE WIRE/FFR STUDY N/A 04/13/2016   Procedure: Intravascular Pressure Wire/FFR Study;  Surgeon: Nelva Bush, MD;  Location: [place] [facility] LAB;  Service: Cardiovascular;  Laterality: N/A;   LEFT HEART CATH AND CORONARY ANGIOGRAPHY N/A 04/13/2016   Procedure: Left Heart Cath and Coronary Angiography;  Surgeon: Nelva Bush, MD;  Location: [place] [facility] LAB;  Service: Cardiovascular;  Laterality: N/A;   LEFT HEART CATHETERIZATION WITH CORONARY ANGIOGRAM N/A 04/07/2012   Procedure: LEFT HEART CATHETERIZATION WITH CORONARY ANGIOGRAM;  Surgeon: Sherren Mocha, MD;  Location: [facility] C[facility] LAB;  Service: Cardiovascular;  Laterality: N/A;   PERCUTANEOUS CORONARY STENT INTERVENTION (PCI-S)  04/07/2012   Procedure: PERCUTANEOUS CORONARY STENT INTERVENTION (PCI-S);  Surgeon: Sherren Mocha, MD;  Location: [facility] CATH LAB;  Service: Cardiovascular;;   POLYPECTOMY  09/24/2017   Procedure: POLYPECTOMY;  Surgeon: Danie Binder, MD;  [facility];  Service: Endoscopy;;  hepatic flexure polyp, transverse colon polyps x4   TEE WITHOUT CARDIOVERSION N/A 04/23/2016   Procedure: TRANSESOPHAGEAL ECHOCARDIOGRAM (TEE);  Surgeon: Gaye Pollack, MD;  Location: [place];  Service: Open Heart Surgery;  Laterality: N/A;   Social History:  reports that he quit smoking about 23 years ago. His smoking use included cigarettes. He has a 20.00 pack-year smoking history. He quit smokeless tobacco use about 23 years ago. He reports that he does not drink alcohol and does not use drugs.  Allergies  Allergen Reactions   No Known Allergies     Family History  Problem Relation  Age of Onset   Heart attack Father        MI x 2 in late 50's, currently 68's   CVA Sister        late 48s   CAD Maternal Uncle        MI at 81   Colon cancer Neg Hx    Colon polyps Neg Hx     Prior to Admission medications   Medication Sig Start Date End Date Taking? Authorizing Provider  aspirin EC 81 MG EC tablet Take 1 tablet (81 mg total) by mouth daily. 04/09/12  Yes Arguello, Roger A, PA-C  atorvastatin (LIPITOR) 40 MG tablet TAKE ONE (1) TABLET EACH DAY 08/15/20  Yes Susy Frizzle, MD  gabapentin (NEURONTIN) 600 MG tablet TAKE ONE (1) TABLET THREE (3) TIMES EACH DAY 08/15/20  Yes Susy Frizzle, MD  lisinopril (ZESTRIL) 2.5 MG tablet Take 1 tablet (2.5 mg total) by mouth daily. 08/15/20  Yes Susy Frizzle, MD  metFORMIN (GLUCOPHAGE) 1000 MG tablet Take 1 tablet (1,000 mg total) by mouth 2 (two) times daily. 08/15/20  Yes Susy Frizzle, MD  metoprolol tartrate (LOPRESSOR) 25 MG tablet  TAKE ONE TABLET BY MOUTH TWICE DAILY 08/15/20  Yes Susy Frizzle, MD  oxyCODONE (OXY IR/ROXICODONE) 5 MG immediate release tablet Take 1 tablet (5 mg total) by mouth every 12 (twelve) hours as needed for severe pain. Chronic Pain. Dx: G89.4, G62.9 12/24/19  Yes Susy Frizzle, MD  pantoprazole (PROTONIX) 40 MG tablet TAKE ONE (1) TABLET EACH DAY 08/15/20  Yes Susy Frizzle, MD  pioglitazone (ACTOS) 30 MG tablet TAKE ONE (1) TABLET EACH DAY 08/15/20  Yes Susy Frizzle, MD  Alprostadil (PROSTAGLANDIN E1) POWD 1 mL by Intracavernosal route daily as needed. Patient not taking: Reported on 06/01/2021 04/12/21   Festus Aloe, MD    Physical Exam: Vitals:   06/01/21 1230 06/01/21 1300 06/01/21 1301 06/01/21 1400  BP: 134/78 (!) 140/93  102/76  Pulse: 67 80  71  Resp: 10 (!) '24 16 17  '$ Temp:      TempSrc:      SpO2: 99% 98%  95%  Weight:      Height:       GENERAL:  A&O x 3, NAD, well developed, cooperative, follows commands HEENT: [place]/AT, No thrush, No icterus, No oral  ulcers Neck:  No neck mass, No meningismus, soft, supple CV: RRR, no S3, no S4, no rub, no JVD Lungs:  bibasilar crackles. No wheeze Abd: soft/NT +BS, nondistended Ext: 1 + LE edema, no lymphangitis, no cyanosis, no rashes Neuro:  CN II-XII intact, strength 4/5 in RUE, RLE, strength 4/5 LUE, LLE; sensation intact bilateral; no dysmetria; babinski equivocal   Data Reviewed: Data reviewed in history  Assessment and Plan: * Chest pain Patient has typical and atypical features Echocardiogram Appreciate cardiology consult IV heparin per cardiology Troponin 25>>18 Continue Lopressor, aspirin, statin  Acute on chronic combined systolic and diastolic CHF (congestive heart failure) ([place]) 04/23/2016 TEE--EF 40-45%; IW AK 04/19/2016 echo EF 50-55%, grade 1 DD, The patient appears hypervolemic clinically--given a dose of furosemide 40 mg IV x1 -- Reassess volume status in a.m. for repeat furosemide dosing -- Echocardiogram  Essential hypertension Continue metoprolol tartrate 25 mg twice daily  CAD (coronary artery disease), native coronary artery s/p prior PCI to LCx and underwent CABG in 04/2016 with LIMA-LAD, seq SVG-OM1-OM2 and SVG-RCA. - Continue ASA 81 mg daily, Atorvastatin 40 mg daily, Lisinopril 2.5 mg daily and Lopressor 25 mg twice daily.  Class II obesity BMI 39.87 Lifestyle modification  Uncontrolled type 2 diabetes mellitus with hyperglycemia, without long-term current use of insulin (HCC) Holding Actos NovoLog sliding scale 05/01/2018 hemoglobin A1c 7.3  Mixed hyperlipidemia Continue statin      Advance Care Planning: FULL  Consults: cardiology  Family Communication: none  Severity of Illness: The appropriate patient status for this patient is OBSERVATION. Observation status is judged to be reasonable and necessary in order to provide the required intensity of service to ensure the patient's safety. The patient's presenting symptoms, physical exam findings, and  initial radiographic and laboratory data in the context of their medical condition is felt to place them at decreased risk for further clinical deterioration. Furthermore, it is anticipated that the patient will be medically stable for discharge from the hospital within 2 midnights of admission.   Author: Orson Eva, MD 06/01/2021 2:41 PM  For on call review [email or web address].

## 2021-06-01 NOTE — Progress Notes (Signed)
*  PRELIMINARY RESULTS* Echocardiogram 2D Echocardiogram has been performed with Definity.  Samuel Germany 06/01/2021, 3:16 PM

## 2021-06-01 NOTE — ED Notes (Signed)
Echo at bedside

## 2021-06-01 NOTE — Progress Notes (Incomplete)
Patient seen and discussed with PA Ahmed Prima, I agree with her documentation. 61 yo male history of CAD with STEMI in 2014 receiving DES  to LCX, CABG 2018 with LIMA-LAD, SVG-OM1 and OM2, SVG-RCA. History of HTN, HL, DM2,    ER vitals 177/89 HR 71 99% RA WBC 8.3 Hgb 14.8 Plt 236 K 4 Cr 0.97 BUN 23 BNP 131 CXR cardiomegaly, mild congestion EKG SR, RBBB, chronic nonspecific lateral ST/T changes Trop 25-->18  04/2016 echo: LVEF 55-60%, inferior akinesis, grade I dd     1.CAD/Chest pain Mild troponin downtrending, EKG without specific ischemic changes

## 2021-06-01 NOTE — Assessment & Plan Note (Signed)
s/p prior PCI to LCx and underwent CABG in 04/2016 with LIMA-LAD, seq SVG-OM1-OM2 and SVG-RCA. - Continue ASA 81 mg daily, Atorvastatin 40 mg daily, Lisinopril 2.5 mg daily and Lopressor 25 mg twice daily. -will need outpatient cardiac PET

## 2021-06-01 NOTE — Assessment & Plan Note (Signed)
Continue statin. 

## 2021-06-01 NOTE — Assessment & Plan Note (Addendum)
Holding Actos>>restart after d/c Restart metformin after d/c NovoLog sliding scale 05/01/2018 hemoglobin A1c 7.3

## 2021-06-01 NOTE — Assessment & Plan Note (Signed)
Patient has typical and atypical features Echocardiogram Appreciate cardiology consult IV heparin per cardiology Troponin 25>>18 Continue Lopressor, aspirin, statin

## 2021-06-01 NOTE — Assessment & Plan Note (Addendum)
04/23/2016 TEE--EF 40-45%; IW AK 04/19/2016 echo EF 50-55%, grade 1 DD, The patient appears hypervolemic clinically--given a dose of furosemide 40 mg IV x1 -- Reassess volume status in a.m. for repeat furosemide dosing -- Echocardiogram

## 2021-06-01 NOTE — ED Provider Notes (Signed)
[place] Provider Note   CSN: [id number] Arrival date & time: 06/01/21  1001     History {Add pertinent medical, surgical, social history, OB history to HPI:1} Chief Complaint  Patient presents with   Chest Pain    Juan Ray is a 61 y.o. male.  Patient has a history of diabetes hypertension.  He also has a history of coronary disease with bypass surgery in 2014 and last catheter 2019.  Patient complains of chest discomfort since this morning.   Chest Pain  HPI: A 61 year old patient with a history of treated diabetes, hypertension, hypercholesterolemia and obesity presents for evaluation of chest pain. Initial onset of pain was less than one hour ago. The patient's chest pain is not worse with exertion and is relieved by nitroglycerin. The patient's chest pain is middle- or left-sided, is not well-localized, is not described as heaviness/pressure/tightness, is not sharp and does not radiate to the arms/jaw/neck. The patient does not complain of nausea and denies diaphoresis. The patient has no history of stroke, has no history of peripheral artery disease, has not smoked in the past 90 days and has no relevant family history of coronary artery disease (first degree relative at less than age 12).   Home Medications Prior to Admission medications   Medication Sig Start Date End Date Taking? Authorizing Provider  aspirin EC 81 MG EC tablet Take 1 tablet (81 mg total) by mouth daily. 04/09/12  Yes Arguello, Roger A, PA-C  atorvastatin (LIPITOR) 40 MG tablet TAKE ONE (1) TABLET EACH DAY 08/15/20  Yes Susy Frizzle, MD  gabapentin (NEURONTIN) 600 MG tablet TAKE ONE (1) TABLET THREE (3) TIMES EACH DAY 08/15/20  Yes Susy Frizzle, MD  lisinopril (ZESTRIL) 2.5 MG tablet Take 1 tablet (2.5 mg total) by mouth daily. 08/15/20  Yes Susy Frizzle, MD  metFORMIN (GLUCOPHAGE) 1000 MG tablet Take 1 tablet (1,000 mg total) by mouth 2 (two) times daily. 08/15/20  Yes  Susy Frizzle, MD  metoprolol tartrate (LOPRESSOR) 25 MG tablet TAKE ONE TABLET BY MOUTH TWICE DAILY 08/15/20  Yes Susy Frizzle, MD  oxyCODONE (OXY IR/ROXICODONE) 5 MG immediate release tablet Take 1 tablet (5 mg total) by mouth every 12 (twelve) hours as needed for severe pain. Chronic Pain. Dx: G89.4, G62.9 12/24/19  Yes Susy Frizzle, MD  pantoprazole (PROTONIX) 40 MG tablet TAKE ONE (1) TABLET EACH DAY 08/15/20  Yes Susy Frizzle, MD  pioglitazone (ACTOS) 30 MG tablet TAKE ONE (1) TABLET EACH DAY 08/15/20  Yes Susy Frizzle, MD  Alprostadil (PROSTAGLANDIN E1) POWD 1 mL by Intracavernosal route daily as needed. Patient not taking: Reported on 06/01/2021 04/12/21   Festus Aloe, MD      Allergies    No known allergies    Review of Systems   Review of Systems  Cardiovascular:  Positive for chest pain.   Physical Exam Updated Vital Signs BP (!) 140/93   Pulse 80   Temp 98.1 F (36.7 C) (Oral)   Resp 16   Ht '5\' 9"'$  (1.753 m)   Wt 122.5 kg   SpO2 98%   BMI 39.87 kg/m  Physical Exam  ED Results / Procedures / Treatments   Labs (all labs ordered are listed, but only abnormal results are displayed) Labs Reviewed  CBC WITH DIFFERENTIAL/PLATELET - Abnormal; Notable for the following components:      Result Value   Monocytes Absolute 1.3 (*)    All other components within normal  limits  COMPREHENSIVE METABOLIC PANEL - Abnormal; Notable for the following components:   Glucose, Bld 186 (*)    Anion gap 4 (*)    All other components within normal limits  BRAIN NATRIURETIC PEPTIDE - Abnormal; Notable for the following components:   B Natriuretic Peptide 131.0 (*)    All other components within normal limits  TROPONIN I (HIGH SENSITIVITY) - Abnormal; Notable for the following components:   Troponin I (High Sensitivity) 25 (*)    All other components within normal limits  TROPONIN I (HIGH SENSITIVITY) - Abnormal; Notable for the following components:   Troponin I  (High Sensitivity) 18 (*)    All other components within normal limits  HEPARIN LEVEL (UNFRACTIONATED)    EKG None  Radiology DG Chest Port 1 View  Result Date: 06/01/2021 CLINICAL DATA:  Centralized chest pain extending in the right upper extremity. EXAM: PORTABLE CHEST 1 VIEW COMPARISON:  One-view chest x-ray 04/07/2019 FINDINGS: Patient is status post median sternotomy. Heart is enlarged. Mild pulmonary vascular congestion is present. No edema or effusion is present. No focal airspace consolidation is present. Degenerative changes are noted in the shoulders. IMPRESSION: Cardiomegaly and mild pulmonary vascular congestion without frank edema. Electronically Signed   By: San Morelle M.D.   On: 06/01/2021 10:58    Procedures Procedures  {Document cardiac monitor, telemetry assessment procedure when appropriate:1}  Medications Ordered in ED Medications  heparin ADULT infusion 100 units/mL (25000 units/23m) (1,200 Units/hr Intravenous New Bag/Given 06/01/21 1243)  nitroGLYCERIN (NITROSTAT) SL tablet 0.4 mg (0.4 mg Sublingual Given 06/01/21 1041)  nitroGLYCERIN (NITROGLYN) 2 % ointment 1 inch (1 inch Topical Given 06/01/21 1125)  heparin bolus via infusion 4,000 Units (4,000 Units Intravenous Bolus from Bag 06/01/21 1243)  furosemide (LASIX) injection 40 mg (40 mg Intravenous Given 06/01/21 1238)    ED Course/ Medical Decision Making/ A&P  CRITICAL CARE Performed by: JMilton FergusonTotal critical care time: 35 minutes Critical care time was exclusive of separately billable procedures and treating other patients. Critical care was necessary to treat or prevent imminent or life-threatening deterioration. Critical care was time spent personally by me on the following activities: development of treatment plan with patient and/or surrogate as well as nursing, discussions with consultants, evaluation of patient's response to treatment, examination of patient, obtaining history from patient or  surrogate, ordering and performing treatments and interventions, ordering and review of laboratory studies, ordering and review of radiographic studies, pulse oximetry and re-evaluation of patient's condition.  HEAR Score: 5                       Medical Decision Making Amount and/or Complexity of Data Reviewed Labs: ordered. Radiology: ordered.  Risk Prescription drug management. Decision regarding hospitalization.  Patient with chest pain and elevated troponins but they are descending.  He will be admitted to medicine with cardiology consult  {Document critical care time when appropriate:1} {Document review of labs and clinical decision tools ie heart score, Chads2Vasc2 etc:1}  {Document your independent review of radiology images, and any outside records:1} {Document your discussion with family members, caretakers, and with consultants:1} {Document social determinants of health affecting pt's care:1} {Document your decision making why or why not admission, treatments were needed:1} Final Clinical Impression(s) / ED Diagnoses Final diagnoses:  Unstable angina ([place]    Rx / DC Orders ED Discharge Orders     None

## 2021-06-01 NOTE — ED Notes (Signed)
Patient states that nitro "helped for a little bit and came right back."

## 2021-06-01 NOTE — Progress Notes (Signed)
ANTICOAGULATION CONSULT NOTE - Follow Up Consult  Pharmacy Consult for heparin dosing. Indication: chest pain/ACS  Allergies  Allergen Reactions   No Known Allergies     Patient Measurements: Height: '5\' 9"'$  (175.3 cm) Weight: 122.5 kg (270 lb) IBW/kg (Calculated) : 70.7 Heparin Dosing Weight: 99 kg  Vital Signs: Temp: 98.1 F (36.7 C) (06/01 2016) Temp Source: Oral (06/01 2016) BP: 113/67 (06/01 2016) Pulse Rate: 77 (06/01 2016)  Labs: Recent Labs    06/01/21 1000 06/01/21 1212 06/01/21 1558 06/01/21 2005  HGB 14.8  --   --   --   HCT 42.5  --   --   --   PLT 236  --   --   --   HEPARINUNFRC  --   --  0.14* <0.10*  CREATININE 0.97  --   --   --   TROPONINIHS 25* 18*  --   --     Estimated Creatinine Clearance: 102.1 mL/min (by C-G formula based on SCr of 0.97 mg/dL).   Medications:  Scheduled:   [START ON 06/02/2021] aspirin EC  81 mg Oral Daily   atorvastatin  40 mg Oral q1800   gabapentin  600 mg Oral TID   heparin  3,000 Units Intravenous Once   insulin aspart  0-5 Units Subcutaneous QHS   insulin aspart  0-9 Units Subcutaneous TID WC   [START ON 06/02/2021] lisinopril  2.5 mg Oral Daily   metoprolol tartrate  25 mg Oral BID   [START ON 06/02/2021] pantoprazole  40 mg Oral Daily    Assessment:  Level < 0.1, subtherapeutic   RN states heparin is running at correct rate and no problems. Lab ran the level again.  Goal of Therapy:  Heparin level 0.3-0.7 units/ml Monitor platelets by anticoagulation protocol: Yes   Plan:  Give 3000 units bolus x 1 Increase heparin infusion to 1500 units/hr Check anti-Xa level in 6 hours and daily while on heparin Continue to monitor H&H and platelets  Blenda Nicely 06/01/2021,9:47 PM

## 2021-06-01 NOTE — Progress Notes (Signed)
ANTICOAGULATION CONSULT NOTE - Initial Consult  Pharmacy Consult for heparin Indication: chest pain/ACS  Allergies  Allergen Reactions   No Known Allergies     Patient Measurements: Height: '5\' 9"'$  (175.3 cm) Weight: 122.5 kg (270 lb) IBW/kg (Calculated) : 70.7 Heparin Dosing Weight: 99 kg  Vital Signs: Temp: 98.1 F (36.7 C) (06/01 1018) Temp Source: Oral (06/01 1018) BP: 121/81 (06/01 1130) Pulse Rate: 68 (06/01 1130)  Labs: Recent Labs    06/01/21 1000  HGB 14.8  HCT 42.5  PLT 236  CREATININE 0.97  TROPONINIHS 25*    Estimated Creatinine Clearance: 102.1 mL/min (by C-G formula based on SCr of 0.97 mg/dL).   Medical History: Past Medical History:  Diagnosis Date   Anxiety    Arthritis    CAD (coronary artery disease) 04/07/2012   a. Inferoposterior STEMI s/p DES-mid LCx. b. s/p CABG 04/2016.   Diabetes mellitus    Diabetic neuropathy (HCC)    GERD (gastroesophageal reflux disease)    High cholesterol    History of kidney stones    Hypertension    Ischemic cardiomyopathy    a. EF not totally clear - in 04/2016 was 35-40% by cath, 55-60% by echo, 40-45% by TEE all around same time.   Myocardial infarction [facility]) 2013   Neuropathy    Obesity    Pancreatitis    a. mild by CT 06/2011   Peptic ulcer disease    a. 06/2009 EGD: multiple gastric and duodenal ulcers.    Medications:  (Not in a hospital admission)   Assessment: Pharmacy consulted to dose heparin in patient with chest pain/ACS.  Patient is not on anticoagulation prior to admission.  CBC WNL Trop 25  Goal of Therapy:  Heparin level 0.3-0.7 units/ml Monitor platelets by anticoagulation protocol: Yes   Plan:  Give 4000 units bolus x 1 Start heparin infusion at 1200 units/hr Check anti-Xa level in 6 hours and daily while on heparin Continue to monitor H&H and platelets  Margot Ables, PharmD Clinical Pharmacist 06/01/2021 12:22 PM

## 2021-06-02 DIAGNOSIS — R079 Chest pain, unspecified: Secondary | ICD-10-CM

## 2021-06-02 DIAGNOSIS — I200 Unstable angina: Secondary | ICD-10-CM

## 2021-06-02 LAB — CBC
HCT: 42.4 % (ref 39.0–52.0)
Hemoglobin: 14.8 g/dL (ref 13.0–17.0)
MCH: 33 pg (ref 26.0–34.0)
MCHC: 34.9 g/dL (ref 30.0–36.0)
MCV: 94.6 fL (ref 80.0–100.0)
Platelets: 248 10*3/uL (ref 150–400)
RBC: 4.48 MIL/uL (ref 4.22–5.81)
RDW: 13.1 % (ref 11.5–15.5)
WBC: 9.8 10*3/uL (ref 4.0–10.5)
nRBC: 0 % (ref 0.0–0.2)

## 2021-06-02 LAB — BASIC METABOLIC PANEL
Anion gap: 7 (ref 5–15)
BUN: 24 mg/dL — ABNORMAL HIGH (ref 8–23)
CO2: 27 mmol/L (ref 22–32)
Calcium: 8.7 mg/dL — ABNORMAL LOW (ref 8.9–10.3)
Chloride: 102 mmol/L (ref 98–111)
Creatinine, Ser: 0.99 mg/dL (ref 0.61–1.24)
GFR, Estimated: >60 mL/min (ref >60)
Glucose, Bld: 204 mg/dL — ABNORMAL HIGH (ref 70–99)
Potassium: 3.7 mmol/L (ref 3.5–5.1)
Sodium: 136 mmol/L (ref 135–145)

## 2021-06-02 LAB — LIPID PANEL
Cholesterol: 176 mg/dL (ref 0–200)
HDL: 36 mg/dL — ABNORMAL LOW (ref >40)
LDL Cholesterol: 86 mg/dL (ref 0–99)
Total CHOL/HDL Ratio: 4.9 RATIO
Triglycerides: 271 mg/dL — ABNORMAL HIGH (ref <150)
VLDL: 54 mg/dL — ABNORMAL HIGH (ref 0–40)

## 2021-06-02 LAB — HEPARIN LEVEL (UNFRACTIONATED): Heparin Unfractionated: 0.22 IU/mL — ABNORMAL LOW (ref 0.30–0.70)

## 2021-06-02 LAB — GLUCOSE, CAPILLARY
Glucose-Capillary: 198 mg/dL — ABNORMAL HIGH (ref 70–99)
Glucose-Capillary: 265 mg/dL — ABNORMAL HIGH (ref 70–99)

## 2021-06-02 LAB — TROPONIN I (HIGH SENSITIVITY): Troponin I (High Sensitivity): 11 ng/L (ref <18)

## 2021-06-02 MED ORDER — FUROSEMIDE 20 MG PO TABS: 20.0000 mg | ORAL_TABLET | Freq: Every day | ORAL | 1 refills | Status: DC

## 2021-06-02 MED ORDER — FUROSEMIDE 20 MG PO TABS
20.0000 mg | ORAL_TABLET | Freq: Every day | ORAL | Status: DC
  Administered 2021-06-02: 20 mg via ORAL
  Filled 2021-06-02: qty 1

## 2021-06-02 NOTE — Progress Notes (Signed)
Inpatient Diabetes Program Recommendations  AACE/ADA: New Consensus Statement on Inpatient Glycemic Control (2015)  Target Ranges:  Prepandial:   less than 140 mg/dL      Peak postprandial:   less than 180 mg/dL (1-2 hours)      Critically ill patients:  140 - 180 mg/dL   Lab Results  Component Value Date   GLUCAP 198 (H) 06/02/2021   HGBA1C 8.1 (H) 06/01/2021    Review of Glycemic Control  Latest Reference Range & Units 06/01/21 21:47 06/02/21 07:48  Glucose-Capillary 70 - 99 mg/dL 177 (H) 198 (H)   Diabetes history: DM 2 Outpatient Diabetes medications:  Metformin 1000 mg bid, Actos 30 mg daily Current orders for Inpatient glycemic control:  Novolog sensitive tid with meals and HS  Inpatient Diabetes Program Recommendations:    Note elevated A1C.  Would likely benefit from SGLT-2 due to hx. Of CHF? However no insurance documented. Will need close f/u with PCP regarding DM management.  While in hospital, consider adding Semglee 15 units daily.   Thanks,  Adah Perl, RN, BC-ADM Inpatient Diabetes Coordinator Pager [phone]  (8a-5p)

## 2021-06-02 NOTE — Progress Notes (Signed)
ANTICOAGULATION CONSULT NOTE - Follow Up Consult  Pharmacy Consult for heparin dosing Indication: chest pain/ACS  Allergies  Allergen Reactions   No Known Allergies     Patient Measurements: Height: '5\' 9"'$  (175.3 cm) Weight: 122.5 kg (270 lb) IBW/kg (Calculated) : 70.7 Heparin Dosing Weight: 99 kg  Vital Signs: Temp: 98.2 F (36.8 C) (06/02 0512) Temp Source: Oral (06/01 2016) BP: 120/78 (06/02 0512) Pulse Rate: 87 (06/02 0512)  Labs: Recent Labs    06/01/21 1000 06/01/21 1212 06/01/21 1558 06/01/21 2005 06/02/21 0549  HGB 14.8  --   --   --  14.8  HCT 42.5  --   --   --  42.4  PLT 236  --   --   --  248  HEPARINUNFRC  --   --  0.14* <0.10* 0.22*  CREATININE 0.97  --   --   --  0.99  TROPONINIHS 25* 18*  --   --  11     Estimated Creatinine Clearance: 100 mL/min (by C-G formula based on SCr of 0.99 mg/dL).   Medications:  Scheduled:   aspirin EC  81 mg Oral Daily   atorvastatin  40 mg Oral q1800   gabapentin  600 mg Oral TID   insulin aspart  0-5 Units Subcutaneous QHS   insulin aspart  0-9 Units Subcutaneous TID WC   lisinopril  2.5 mg Oral Daily   metoprolol tartrate  25 mg Oral BID   pantoprazole  40 mg Oral Daily    Assessment:  Level < 0.1, subtherapeutic   RN states heparin is running at correct rate and no problems. Lab ran the level again.   6/2 AM update:  Heparin level low but trending up  Goal of Therapy:  Heparin level 0.3-0.7 units/ml Monitor platelets by anticoagulation protocol: Yes   Plan:  Inc heparin to 1700 units/hr Re-check heparin level in 8 hours  Narda Bonds, PharmD, [place] Pharmacist Phone: [phone]

## 2021-06-02 NOTE — Progress Notes (Signed)
Ng Discharge Note  Admit Date:  06/01/2021 Discharge date: 06/02/2021   Nike Kraemer to be D/C'd Home per MD order.  AVS completed. Patient/caregiver able to verbalize understanding.  Discharge Medication: Allergies as of 06/02/2021       Reactions   No Known Allergies         Medication List     STOP taking these medications    Prostaglandin E1 Powd       TAKE these medications    aspirin EC 81 MG tablet Take 1 tablet (81 mg total) by mouth daily.   atorvastatin 40 MG tablet Commonly known as: LIPITOR TAKE ONE (1) TABLET EACH DAY   furosemide 20 MG tablet Commonly known as: LASIX Take 1 tablet (20 mg total) by mouth daily.   gabapentin 600 MG tablet Commonly known as: NEURONTIN TAKE ONE (1) TABLET THREE (3) TIMES EACH DAY   lisinopril 2.5 MG tablet Commonly known as: ZESTRIL Take 1 tablet (2.5 mg total) by mouth daily.   metFORMIN 1000 MG tablet Commonly known as: GLUCOPHAGE Take 1 tablet (1,000 mg total) by mouth 2 (two) times daily.   metoprolol tartrate 25 MG tablet Commonly known as: LOPRESSOR TAKE ONE TABLET BY MOUTH TWICE DAILY   oxyCODONE 5 MG immediate release tablet Commonly known as: Oxy IR/ROXICODONE Take 1 tablet (5 mg total) by mouth every 12 (twelve) hours as needed for severe pain. Chronic Pain. Dx: G89.4, G62.9   pantoprazole 40 MG tablet Commonly known as: PROTONIX TAKE ONE (1) TABLET EACH DAY   pioglitazone 30 MG tablet Commonly known as: ACTOS TAKE ONE (1) TABLET EACH DAY        Discharge Assessment: Vitals:   06/01/21 2016 06/02/21 0512  BP: 113/67 120/78  Pulse: 77 87  Resp: 20 18  Temp: 98.1 F (36.7 C) 98.2 F (36.8 C)  SpO2: 98% 97%   Skin clean, dry and intact without evidence of skin break down, no evidence of skin tears noted. IV catheter discontinued intact. Site without signs and symptoms of complications - no redness or edema noted at insertion site, patient denies c/o pain - only slight tenderness at site.   Dressing with slight pressure applied.  D/c Instructions-Education: Discharge instructions given to patient/family with verbalized understanding. D/c education completed with patient/family including follow up instructions, medication list, d/c activities limitations if indicated, with other d/c instructions as indicated by MD - patient able to verbalize understanding, all questions fully answered. Patient instructed to return to ED, call 911, or call MD for any changes in condition.  Patient escorted via [place], and D/C home via private auto.  Tsosie Billing, LPN 06/10/6787 38:10 AM

## 2021-06-02 NOTE — Progress Notes (Signed)
  Transition of Care [facility]) Screening Note   Patient Details  Name: Euell Schiff Date of Birth: April 01, 1960   Transition of Care [facility]) CM/SW Contact:    Iona Beard, [place] Phone Number: 06/02/2021, 10:27 AM  CSW spoke with pt concerning his no insurance status. Pt confirms that his PCP office is the [facility] and he is able to go to appointments there as needed. CSW also spoke with pt about medications. Pt states that he is able to pick up his medications as needed. Pt inquired about assistance with his hospital bill. CSW reached out to financial counselor, Tito Dine to see if pts chart could be reviewed for possible assistance programs. Tito Dine will follow and reach out to pt. TOC signing off.    Transition of Care Department [facility]) has reviewed patient and no TOC needs have been identified at this time. We will continue to monitor patient advancement through interdisciplinary progression rounds. If new patient transition needs arise, please place a TOC consult.

## 2021-06-02 NOTE — Progress Notes (Addendum)
Progress Note  Patient Name: Juan Ray Date of Encounter: 06/02/2021  [facility] Cardiologist: Sherren Mocha, MD   Subjective   Says his pain resolved yesterday and no recurrence overnight or this AM despite NTG patch being removed. Breathing at baseline. Lower extremity edema significantly improved from yesterday.   Inpatient Medications    Scheduled Meds:  aspirin EC  81 mg Oral Daily   atorvastatin  40 mg Oral q1800   gabapentin  600 mg Oral TID   insulin aspart  0-5 Units Subcutaneous QHS   insulin aspart  0-9 Units Subcutaneous TID WC   lisinopril  2.5 mg Oral Daily   metoprolol tartrate  25 mg Oral BID   pantoprazole  40 mg Oral Daily   Continuous Infusions:  heparin 1,700 Units/hr (06/02/21 0639)   PRN Meds: acetaminophen **OR** acetaminophen, ondansetron **OR** ondansetron (ZOFRAN) IV   Vital Signs    Vitals:   06/01/21 1522 06/01/21 1547 06/01/21 2016 06/02/21 0512  BP: 128/80 137/80 113/67 120/78  Pulse: 72 71 77 87  Resp: '16 20 20 18  '$ Temp: 97.8 F (36.6 C) 97.9 F (36.6 C) 98.1 F (36.7 C) 98.2 F (36.8 C)  TempSrc: Oral Oral Oral   SpO2: 96% 98% 98% 97%  Weight:    123.4 kg  Height:        Intake/Output Summary (Last 24 hours) at 06/02/2021 0845 Last data filed at 06/01/2021 2335 Gross per 24 hour  Intake 433.92 ml  Output 1000 ml  Net -566.08 ml      06/02/2021    5:12 AM 06/01/2021   10:19 AM 02/06/2021    1:22 PM  Last 3 Weights  Weight (lbs) 272 lb 0.8 oz 270 lb 241 lb  Weight (kg) 123.4 kg 122.471 kg 109.317 kg      Telemetry    NSR, HR in 70's to 80's with occasional PVC's.  - Personally Reviewed  ECG    No new tracings.   Physical Exam   GEN: Pleasant obese male appearing in no acute distress.   Neck: No JVD Cardiac: RRR, no murmurs, rubs, or gallops.  Respiratory: Clear to auscultation bilaterally without wheezing or rales. GI: Soft, nontender, non-distended  MS: Trace lower extremity edema; No deformity. Neuro:   Nonfocal  Psych: Normal affect   Labs    High Sensitivity Troponin:   Recent Labs  Lab 06/01/21 1000 06/01/21 1212 06/02/21 0549  TROPONINIHS 25* 18* 11     Chemistry Recent Labs  Lab 06/01/21 1000 06/02/21 0549  NA 136 136  K 4.0 3.7  CL 103 102  CO2 29 27  GLUCOSE 186* 204*  BUN 23 24*  CREATININE 0.97 0.99  CALCIUM 9.1 8.7*  PROT 7.1  --   ALBUMIN 3.8  --   AST 24  --   ALT 32  --   ALKPHOS 76  --   BILITOT 0.9  --   GFRNONAA >60 >60  ANIONGAP 4* 7    Lipids  Recent Labs  Lab 06/02/21 0549  CHOL 176  TRIG 271*  HDL 36*  LDLCALC 86  CHOLHDL 4.9    Hematology Recent Labs  Lab 06/01/21 1000 06/02/21 0549  WBC 8.3 9.8  RBC 4.53 4.48  HGB 14.8 14.8  HCT 42.5 42.4  MCV 93.8 94.6  MCH 32.7 33.0  MCHC 34.8 34.9  RDW 12.9 13.1  PLT 236 248   Thyroid No results for input(s): TSH, FREET4 in the last 168 hours.  BNP  Recent Labs  Lab 06/01/21 1035  BNP 131.0*    DDimer No results for input(s): DDIMER in the last 168 hours.   Radiology    DG Chest Port 1 View  Result Date: 06/01/2021 CLINICAL DATA:  Centralized chest pain extending in the right upper extremity. EXAM: PORTABLE CHEST 1 VIEW COMPARISON:  One-view chest x-ray 04/07/2019 FINDINGS: Patient is status post median sternotomy. Heart is enlarged. Mild pulmonary vascular congestion is present. No edema or effusion is present. No focal airspace consolidation is present. Degenerative changes are noted in the shoulders. IMPRESSION: Cardiomegaly and mild pulmonary vascular congestion without frank edema. Electronically Signed   By: San Morelle M.D.   On: 06/01/2021 10:58    Cardiac Studies   Echocardiogram: 06/01/2021 IMPRESSIONS     1. Left ventricular ejection fraction, by estimation, is 50 to 55%. The  left ventricle has low normal function. The left ventricle has no regional  wall motion abnormalities. There is mild left ventricular hypertrophy.  Left ventricular diastolic   parameters are consistent with Grade I diastolic dysfunction (impaired  relaxation).   2. Right ventricular systolic function is normal. The right ventricular  size is normal.   3. The mitral valve is normal in structure. No evidence of mitral valve  regurgitation. No evidence of mitral stenosis.   4. The aortic valve is tricuspid. There is mild calcification of the  aortic valve. There is mild thickening of the aortic valve. Aortic valve  regurgitation is not visualized. No aortic stenosis is present.   Patient Profile     61 y.o. male with a hx of CAD (s/p prior PCI to LCx and underwent CABG in 04/2016 with LIMA-LAD, seq SVG-OM1-OM2 and SVG-RCA), HFimpEF (variable reports as EF 35-40% by cath in 04/2016 and 55-60% by TTE but 40-45% by TEE at time of CABG), HTN, HLD and Type 2 DM who is currently admitted for evaluation of chest pain.   Assessment & Plan   1. Chest Pain with Mixed Features - His chest pain had mixed features as it was a stabbing pain persisting for 4+ hours but resolved with NTG.  - EKG shows evidence of prior inferior infarct with incomplete RBBB but no acute ST changes. Hs Troponin values have been flat at 25, 18 and 11 with repeat echo showing his EF has improved to 50-55%.  - His pain has now resolved. Will stop IV Heparin. Will arrange for outpatient follow-up and can consider outpatient cardiac PET at that time. He is currently without insurance coverage, therefore will ask Social Work to see as he may be a candidate for [employer].    2. HFimpEF - Assessment of his EF was variable in 2018 as this was at 35 to 40% by cardiac catheterization but at 55 to 60% by TTE. Repeat echo this admission shows his EF is at 50-55% with no regional WMA.  - BNP was mildly elevated at 131 on admission and he received IV Lasix '40mg'$  x1 with a recorded net output of -566 mL. Given improvement in volume status, will switch to PO Lasix '20mg'$  daily. Recheck BMET at outpatient follow-up.  -  Remains on Lisinopril and Lopressor. He would be a good candidate for an SGLT2 inhibitor given his low-normal EF and Type 2 DM but he is without insurance coverage, therefore would explore patient assistance options as an outpatient and can consider at that time.    3. CAD - He is s/p prior PCI to LCx and underwent CABG in  04/2016 with LIMA-LAD, seq SVG-OM1-OM2 and SVG-RCA. Would recommend outpatient ischemic evaluation as discussed above.  - Continue ASA '81mg'$  daily, Atorvastatin '40mg'$  daily and Lopressor '25mg'$  BID.   4. HTN - BP was initially elevated in the ED to 177/89, improved since and at 120/78 this AM. Continue Lisinopril 2.'5mg'$  daily and Lopressor '25mg'$  BID.    5. HLD - FLP shows total cholesterol of 176, HDL 36, triglycerides 271 and LDL 86. He was without his statin for some time so would recheck as an outpatient. Continue Atorvastatin '40mg'$  daily.   [facility] will sign off.   Medication Recommendations: Aspirin 81 mg daily, atorvastatin 40 mg daily, Lasix 20 mg daily, lisinopril 2.5 mg daily, metoprolol 25 mg twice daily Other recommendations (labs, testing, etc): BMET within 1 week Follow up as an outpatient: Will schedule     For questions or updates, please contact [place] Please consult [email or web address] for contact info under        Signed, Erma Heritage, PA-C  06/02/2021, 8:45 AM     Patient seen and examined.  Agree with above documentation.  On exam, patient is alert and oriented, regular rate and rhythm, no murmurs, lungs CTAB, trace LE edema, no JVD.  He denies any further chest pain.  Given chest pain for hours with minimal troponin elevation, no evidence of acute coronary syndrome.  Will consider outpatient stress PET, though this is complicated by his lack of insurance.  We will ask social worker to see patient about available resources.  He appears euvolemic, will transition to p.o. Lasix. We will sign off, will arrange cardiology follow-up.  Donato Heinz, MD

## 2021-06-02 NOTE — Discharge Summary (Addendum)
Physician Discharge Summary   Patient: Juan Ray MRN: [id number] DOB: 08-27-1960  Admit date:     06/01/2021  Discharge date: 06/02/21  Discharge Physician: Shanon Brow Graciella Arment   PCP: [name], [facility]   Recommendations at discharge:   Please follow up with primary care provider within 1-2 weeks  Please repeat BMP and CBC in one week    Hospital Course: 61 year old male with a history of coronary artery disease with STEMI 2014 and DES to the left circumflex, diabetes mellitus type 2, hypertension, hyperlipidemia presenting with substernal chest pain that woke him up in the morning of 06/01/2021.  The patient states that he has had some intermittent chest discomfort on and off for the past several months lasting about 15 minutes.  There is no alleviating or aggravating factors that he can recall.  In addition, the patient relates that he has been having some increased dyspnea on exertion as well.  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  He intermittently takes his mom's furosemide when his leg edema worsens.  After waking up with chest discomfort, the patient took 2 nitroglycerin with relief.  However, his chest discomfort returned after about 5 to 10 minutes.  In the emergency department, the patient was placed on a nitroglycerin patch with relief of his chest discomfort.  He denies any frank orthopnea or PND. Patient denies fevers, chills, headache,nausea, vomiting, diarrhea, abdominal pain, dysuria, hematuria, hematochezia, and melena. In the ED, the patient was afebrile and hemodynamically stable with oxygen saturation 99% on room air.  BMP showed sodium 136, potassium 4.0, bicarbonate 29, serum creatinine 0.97.  LFTs were unremarkable.  WBC 8.3, hemoglobin 14.8, platelets 230,000.  MP was 131.  Troponin 25>> 18.  Chest x-ray showed pulmonary vascular congestion.  BNP 131.  Cardiology was consulted.  The patient was  started on a heparin drip.  He was given a dose of IV furosemide 40 mg IV x1.   Assessment and Plan: * Chest pain Patient has typical and atypical features 6/1 Echo EF 50-55% (previously 40-45%), G1DD, no WMA Appreciate cardiology consult IV heparin initially started>>stopped with troponins trending down Troponin 25>>18 Continue Lopressor, aspirin, statin -6/2 cleared for d/c by cardiology  Acute on chronic combined systolic and diastolic CHF (congestive heart failure) ([place]) 04/23/2016 TEE--EF 40-45%; IW AK 04/19/2016 echo EF 50-55%, grade 1 DD, Given lasix IV 40 mg x 1>>d/c home with lasix 20 mg po daily -- Reassess volume status in a.m. for repeat furosemide dosing 06/01/21 Echo--Echo EF 50-55% (previously 40-45%), G1DD, no WMA  Essential hypertension Continue metoprolol tartrate 25 mg twice daily  CAD (coronary artery disease), native coronary artery s/p prior PCI to LCx and underwent CABG in 04/2016 with LIMA-LAD, seq SVG-OM1-OM2 and SVG-RCA. - Continue ASA 81 mg daily, Atorvastatin 40 mg daily, Lisinopril 2.5 mg daily and Lopressor 25 mg twice daily. -will need outpatient cardiac PET   Class II obesity BMI 39.87 Lifestyle modification  Uncontrolled type 2 diabetes mellitus with hyperglycemia, without long-term current use of insulin (HCC) Holding Actos>>restart after d/c Restart metformin after d/c NovoLog sliding scale 05/01/2018 hemoglobin A1c 7.3  Mixed hyperlipidemia Continue statin         Consultants: cardiology  Procedures performed: none Disposition: Home Diet recommendation:  Cardiac and Carb modified diet DISCHARGE MEDICATION: Allergies as of 06/02/2021       Reactions   No Known Allergies  Medication List     STOP taking these medications    Prostaglandin E1 Powd       TAKE these medications    aspirin EC 81 MG tablet Take 1 tablet (81 mg total) by mouth daily.   atorvastatin 40 MG tablet Commonly known as: LIPITOR TAKE ONE  (1) TABLET EACH DAY   furosemide 20 MG tablet Commonly known as: LASIX Take 1 tablet (20 mg total) by mouth daily.   gabapentin 600 MG tablet Commonly known as: NEURONTIN TAKE ONE (1) TABLET THREE (3) TIMES EACH DAY   lisinopril 2.5 MG tablet Commonly known as: ZESTRIL Take 1 tablet (2.5 mg total) by mouth daily.   metFORMIN 1000 MG tablet Commonly known as: GLUCOPHAGE Take 1 tablet (1,000 mg total) by mouth 2 (two) times daily.   metoprolol tartrate 25 MG tablet Commonly known as: LOPRESSOR TAKE ONE TABLET BY MOUTH TWICE DAILY   oxyCODONE 5 MG immediate release tablet Commonly known as: Oxy IR/ROXICODONE Take 1 tablet (5 mg total) by mouth every 12 (twelve) hours as needed for severe pain. Chronic Pain. Dx: G89.4, G62.9   pantoprazole 40 MG tablet Commonly known as: PROTONIX TAKE ONE (1) TABLET EACH DAY   pioglitazone 30 MG tablet Commonly known as: ACTOS TAKE ONE (1) TABLET EACH DAY        Follow-up Information     Erma Heritage, PA-C Follow up on 06/22/2021.   Specialties: Physician Assistant, Cardiology Why: Cardiology Hospital Follow-up on 06/22/2021 at 3:30 PM. Will be in the [facility]. Contact information: [address] [place] [address] [phone]                Discharge Exam: Danley Danker Weights   06/01/21 1019 06/02/21 0512  Weight: 122.5 kg 123.4 kg   HEENT:  [place]/AT, No thrush, no icterus CV:  RRR, no rub, no S3, no S4 Lung:  CTA, no wheeze, no rhonchi Abd:  soft/+BS, NT Ext:  No edema, no lymphangitis, no synovitis, no rash   Condition at discharge: stable  The results of significant diagnostics from this hospitalization (including imaging, microbiology, ancillary and laboratory) are listed below for reference.   Imaging Studies: DG Chest Port 1 View  Result Date: 06/01/2021 CLINICAL DATA:  Centralized chest pain extending in the right upper extremity. EXAM: PORTABLE CHEST 1 VIEW COMPARISON:  One-view chest x-ray  04/07/2019 FINDINGS: Patient is status post median sternotomy. Heart is enlarged. Mild pulmonary vascular congestion is present. No edema or effusion is present. No focal airspace consolidation is present. Degenerative changes are noted in the shoulders. IMPRESSION: Cardiomegaly and mild pulmonary vascular congestion without frank edema. Electronically Signed   By: San Morelle M.D.   On: 06/01/2021 10:58   ECHOCARDIOGRAM COMPLETE  Result Date: 06/01/2021    ECHOCARDIOGRAM REPORT   Patient Name:   Juan Ray Date of Exam: 06/01/2021 Medical Rec #:  [id number]      Height:       69.0 in Accession #:    [id number]     Weight:       270.0 lb Date of Birth:  January 02, 1960     BSA:          2.347 m Patient Age:    44 years       BP:           102/76 mmHg Patient Gender: M              HR:  73 bpm. Exam Location:  [facility] Procedure: 2D Echo, Cardiac Doppler and Color Doppler Indications:    Chest Pain R07.9  History:        Patient has prior history of Echocardiogram examinations, most                 recent 04/23/2016. CAD and Previous Myocardial Infarction, Prior                 CABG; Risk Factors:Hypertension, Diabetes and Dyslipidemia.  Sonographer:    Alvino Chapel RCS Referring Phys: [id number] [place]  Sonographer Comments: No subcostal window. IMPRESSIONS  1. Left ventricular ejection fraction, by estimation, is 50 to 55%. The left ventricle has low normal function. The left ventricle has no regional wall motion abnormalities. There is mild left ventricular hypertrophy. Left ventricular diastolic parameters are consistent with Grade I diastolic dysfunction (impaired relaxation).  2. Right ventricular systolic function is normal. The right ventricular size is normal.  3. The mitral valve is normal in structure. No evidence of mitral valve regurgitation. No evidence of mitral stenosis.  4. The aortic valve is tricuspid. There is mild calcification of the aortic valve. There is mild  thickening of the aortic valve. Aortic valve regurgitation is not visualized. No aortic stenosis is present. FINDINGS  Left Ventricle: Left ventricular ejection fraction, by estimation, is 50 to 55%. The left ventricle has low normal function. The left ventricle has no regional wall motion abnormalities. Definity contrast agent was given IV to delineate the left ventricular endocardial borders. The left ventricular internal cavity size was normal in size. There is mild left ventricular hypertrophy. Left ventricular diastolic parameters are consistent with Grade I diastolic dysfunction (impaired relaxation). Right Ventricle: The right ventricular size is normal. No increase in right ventricular wall thickness. Right ventricular systolic function is normal. Left Atrium: Left atrial size was normal in size. Right Atrium: Right atrial size was normal in size. Pericardium: There is no evidence of pericardial effusion. Mitral Valve: The mitral valve is normal in structure. No evidence of mitral valve regurgitation. No evidence of mitral valve stenosis. Tricuspid Valve: The tricuspid valve is normal in structure. Tricuspid valve regurgitation is not demonstrated. No evidence of tricuspid stenosis. Aortic Valve: The aortic valve is tricuspid. There is mild calcification of the aortic valve. There is mild thickening of the aortic valve. There is mild aortic valve annular calcification. Aortic valve regurgitation is not visualized. No aortic stenosis  is present. Aortic valve mean gradient measures 3.4 mmHg. Aortic valve peak gradient measures 6.2 mmHg. Aortic valve area, by VTI measures 2.61 cm. Pulmonic Valve: The pulmonic valve was not well visualized. Pulmonic valve regurgitation is not visualized. No evidence of pulmonic stenosis. Aorta: The aortic root is normal in size and structure. IAS/Shunts: The interatrial septum was not well visualized.  LEFT VENTRICLE PLAX 2D LVIDd:         5.00 cm   Diastology LVIDs:          4.20 cm   LV e' medial:    5.11 cm/s LV PW:         1.20 cm   LV E/e' medial:  11.1 LV IVS:        1.10 cm   LV e' lateral:   7.72 cm/s LVOT diam:     2.10 cm   LV E/e' lateral: 7.4 LV SV:         64 LV SV Index:   27 LVOT Area:  3.46 cm  RIGHT VENTRICLE RV S prime:     7.72 cm/s TAPSE (M-mode): 1.5 cm LEFT ATRIUM             Index        RIGHT ATRIUM           Index LA diam:        4.30 cm 1.83 cm/m   RA Area:     15.70 cm LA Vol (A2C):   68.2 ml 29.06 ml/m  RA Volume:   39.60 ml  16.87 ml/m LA Vol (A4C):   62.6 ml 26.67 ml/m LA Biplane Vol: 65.7 ml 27.99 ml/m  AORTIC VALVE AV Area (Vmax):    2.75 cm AV Area (Vmean):   2.38 cm AV Area (VTI):     2.61 cm AV Vmax:           124.66 cm/s AV Vmean:          88.345 cm/s AV VTI:            0.246 m AV Peak Grad:      6.2 mmHg AV Mean Grad:      3.4 mmHg LVOT Vmax:         98.80 cm/s LVOT Vmean:        60.800 cm/s LVOT VTI:          0.185 m LVOT/AV VTI ratio: 0.75  AORTA Ao Root diam: 3.80 cm MITRAL VALVE MV Area (PHT): 2.62 cm    SHUNTS MV Decel Time: 289 msec    Systemic VTI:  0.18 m MV E velocity: 56.90 cm/s  Systemic Diam: 2.10 cm MV A velocity: 74.10 cm/s MV E/A ratio:  0.77 Carlyle Dolly MD Electronically signed by Carlyle Dolly MD Signature Date/Time: 06/01/2021/3:54:22 PM    Final     Microbiology: Results for orders placed or performed in visit on 01/13/19  Novel Coronavirus, NAA ([facility])     Status: None   Collection Time: 01/13/19 11:15 AM   Specimen: Nasopharyngeal(NP) swabs in vial transport medium   NASOPHARYNGE  TESTING  Result Value Ref Range Status   SARS-CoV-2, NAA Not Detected Not Detected Final    Comment: Testing was performed using the cobas(R) SARS-CoV-2 test. This nucleic acid amplification test was developed and its performance characteristics determined by [employer]. Nucleic acid amplification tests include PCR and TMA. This test has not been FDA cleared or approved. This test has been authorized by FDA  under an Emergency Use Authorization (EUA). This test is only authorized for the duration of time the declaration that circumstances exist justifying the authorization of the emergency use of in vitro diagnostic tests for detection of SARS-CoV-2 virus and/or diagnosis of COVID-19 infection under section 564(b)(1) of the Act, 21 U.S.C. 093GHW-2(X) (1), unless the authorization is terminated or revoked sooner. When diagnostic testing is negative, the possibility of a false negative result should be considered in the context of a patient's recent exposures and the presence of clinical signs and symptoms consistent with COVID-19. An individual without symptoms  of COVID-19 and who is not shedding SARS-CoV-2 virus would expect to have a negative (not detected) result in this assay.     Labs: CBC: Recent Labs  Lab 06/01/21 1000 06/02/21 0549  WBC 8.3 9.8  NEUTROABS 4.1  --   HGB 14.8 14.8  HCT 42.5 42.4  MCV 93.8 94.6  PLT 236 937   Basic Metabolic Panel: Recent Labs  Lab 06/01/21 1000 06/02/21 0549  NA 136 136  K 4.0 3.7  CL 103 102  CO2 29 27  GLUCOSE 186* 204*  BUN 23 24*  CREATININE 0.97 0.99  CALCIUM 9.1 8.7*   Liver Function Tests: Recent Labs  Lab 06/01/21 1000  AST 24  ALT 32  ALKPHOS 76  BILITOT 0.9  PROT 7.1  ALBUMIN 3.8   CBG: Recent Labs  Lab 06/01/21 1650 06/01/21 2147 06/02/21 0748  GLUCAP 107* 177* 198*    Discharge time spent: greater than 30 minutes.  Signed: Orson Eva, MD [facility] 06/02/2021

## 2021-06-21 NOTE — Progress Notes (Deleted)
Cardiology Office Note    Date:  06/21/2021   ID:  Juan Ray, DOB [birth year], MRN [id number]  PCP:  [facility]  Cardiologist: Sherren Mocha, MD    No chief complaint on file.   History of Present Illness:    Juan Ray is a 61 y.o. male with past medical history of CAD (s/p prior PCI to LCx and underwent CABG in 04/2016 with LIMA-LAD, seq SVG-OM1-OM2 and SVG-RCA), HFimpEF (variable reports as EF 35-40% by cath in 04/2016 and 55-60% by TTE but 40-45% by TEE at time of CABG), HTN, HLD and Type 2 DM who presents to the office today for hospital follow-up.   He most recently presented to [facility] ED on 06/01/2021 for evaluation of a stabbing discomfort along his sternal region which had been persistent for several hours. Hs troponin values were flat at 25 and 18 and BNP was elevated to 131. Repeat echocardiogram showed his EF was similar to prior imaging of 50 to 55% with no wall motion abnormalities. He did have grade 1 diastolic dysfunction but no significant valve abnormalities. He received IV Lasix and reported significant improvement in his symptoms with this. He was started on Lasix 20 mg daily at the time of discharge and was continued on Lisinopril and Lopressor. Was felt to be a good candidate for an SGLT2 inhibitor given his low normal EF and Type II DM but he was currently without insurance coverage and would need to apply for patient assistance as an outpatient. In regard to his chest pain, it was recommended consider an outpatient cardiac PET scan but this may be challenging given no insurance coverage.  - SGLT2? - BMET      Past Medical History:  Diagnosis Date   Anxiety    Arthritis    CAD (coronary artery disease) 04/07/2012   a. Inferoposterior STEMI s/p DES-mid LCx. b. s/p CABG 04/2016.   Diabetes mellitus    Diabetic neuropathy (HCC)    GERD (gastroesophageal reflux disease)    High cholesterol    History of kidney stones    Hypertension     Ischemic cardiomyopathy    a. EF not totally clear - in 04/2016 was 35-40% by cath, 55-60% by echo, 40-45% by TEE all around same time.   Myocardial infarction [facility]) 2013   Neuropathy    Obesity    Pancreatitis    a. mild by CT 06/2011   Peptic ulcer disease    a. 06/2009 EGD: multiple gastric and duodenal ulcers.    Past Surgical History:  Procedure Laterality Date   BIOPSY  09/24/2017   Procedure: BIOPSY;  Surgeon: Danie Binder, MD;  [facility];  Service: Endoscopy;;  gastric bx's   COLONOSCOPY WITH PROPOFOL N/A 09/24/2017   Procedure: COLONOSCOPY WITH PROPOFOL;  Surgeon: Danie Binder, MD;  [facility];  Service: Endoscopy;  Laterality: N/A;  7:30am   CORONARY ANGIOPLASTY WITH STENT PLACEMENT  04/07/2012   70% prox LAD, 70-80% ostial diagonal, 70% mid-distal LAD, 80% prox OM1, mid LCx totally occluded just distal to OM1 s/p DES, occluded RCA; severe inferior HK, LVEF 45%   CORONARY ARTERY BYPASS GRAFT N/A 04/23/2016   Procedure: CORONARY ARTERY BYPASS GRAFTING (CABG) x 4;  Surgeon: Gaye Pollack, MD;  Location: [place] OR;  Service: Open Heart Surgery;  Laterality: N/A;   ENDOVEIN HARVEST OF GREATER SAPHENOUS VEIN Right 04/23/2016   Procedure: ENDOVEIN HARVEST OF GREATER SAPHENOUS VEIN;  Surgeon: Fernande Boyden  Cyndia Bent, MD;  Location: [place] OR;  Service: Open Heart Surgery;  Laterality: Right;   ESOPHAGOGASTRODUODENOSCOPY  06/18/2011   multiple ulcers in proximal stomach with stigmata of bleeding s/p biopsy, multiple superficial ulcers in duodenal bulb. normal ampulla and second portion of duodenum   ESOPHAGOGASTRODUODENOSCOPY  2013   ESOPHAGOGASTRODUODENOSCOPY (EGD) WITH PROPOFOL N/A 09/24/2017   Procedure: ESOPHAGOGASTRODUODENOSCOPY (EGD) WITH PROPOFOL;  Surgeon: Danie Binder, MD;  [facility];  Service: Endoscopy;  Laterality: N/A;   INTRAVASCULAR PRESSURE WIRE/FFR STUDY N/A 04/13/2016   Procedure: Intravascular Pressure Wire/FFR Study;  Surgeon: Nelva Bush, MD;  Location: [facility];  Service: Cardiovascular;  Laterality: N/A;   LEFT HEART CATH AND CORONARY ANGIOGRAPHY N/A 04/13/2016   Procedure: Left Heart Cath and Coronary Angiography;  Surgeon: Nelva Bush, MD;  Location: [facility];  Service: Cardiovascular;  Laterality: N/A;   LEFT HEART CATHETERIZATION WITH CORONARY ANGIOGRAM N/A 04/07/2012   Procedure: LEFT HEART CATHETERIZATION WITH CORONARY ANGIOGRAM;  Surgeon: Sherren Mocha, MD;  Location: [facility] CATH LAB;  Service: Cardiovascular;  Laterality: N/A;   PERCUTANEOUS CORONARY STENT INTERVENTION (PCI-S)  04/07/2012   Procedure: PERCUTANEOUS CORONARY STENT INTERVENTION (PCI-S);  Surgeon: Sherren Mocha, MD;  Location: [facility] CATH LAB;  Service: Cardiovascular;;   POLYPECTOMY  09/24/2017   Procedure: POLYPECTOMY;  Surgeon: Danie Binder, MD;  [facility];  Service: Endoscopy;;  hepatic flexure polyp, transverse colon polyps x4   TEE WITHOUT CARDIOVERSION N/A 04/23/2016   Procedure: TRANSESOPHAGEAL ECHOCARDIOGRAM (TEE);  Surgeon: Gaye Pollack, MD;  Location: [place];  Service: Open Heart Surgery;  Laterality: N/A;    Current Medications: Outpatient Medications Prior to Visit  Medication Sig Dispense Refill   aspirin EC 81 MG EC tablet Take 1 tablet (81 mg total) by mouth daily.     atorvastatin (LIPITOR) 40 MG tablet TAKE ONE (1) TABLET EACH DAY 30 tablet 0   furosemide (LASIX) 20 MG tablet Take 1 tablet (20 mg total) by mouth daily. 30 tablet 1   gabapentin (NEURONTIN) 600 MG tablet TAKE ONE (1) TABLET THREE (3) TIMES EACH DAY 90 tablet 0   lisinopril (ZESTRIL) 2.5 MG tablet Take 1 tablet (2.5 mg total) by mouth daily. 30 tablet 0   metFORMIN (GLUCOPHAGE) 1000 MG tablet Take 1 tablet (1,000 mg total) by mouth 2 (two) times daily. 60 tablet 0   metoprolol tartrate (LOPRESSOR) 25 MG tablet TAKE ONE TABLET BY MOUTH TWICE DAILY 60 tablet 0   oxyCODONE (OXY IR/ROXICODONE) 5 MG immediate release tablet Take 1 tablet (5 mg  total) by mouth every 12 (twelve) hours as needed for severe pain. Chronic Pain. Dx: G89.4, G62.9 20 tablet 0   pantoprazole (PROTONIX) 40 MG tablet TAKE ONE (1) TABLET EACH DAY 30 tablet 0   pioglitazone (ACTOS) 30 MG tablet TAKE ONE (1) TABLET EACH DAY 30 tablet 0   No facility-administered medications prior to visit.     Allergies:   No known allergies   Social History   Socioeconomic History   Marital status: Married    Spouse name: Not on file   Number of children: 2   Years of education: Not on file   Highest education level: Not on file  Occupational History   Occupation: truck driver  Tobacco Use   Smoking status: Former    Packs/day: 1.00    Years: 20.00    Total pack years: 20.00    Types: Cigarettes    Quit date: 2000  Years since quitting: 23.4   Smokeless tobacco: Former    Quit date: 01/01/1998  Vaping Use   Vaping Use: Never used  Substance and Sexual Activity   Alcohol use: No   Drug use: No   Sexual activity: Yes  Other Topics Concern   Not on file  Social History Narrative   Lives in [place], with his wife and 2 children   Social Determinants of Health   Financial Resource Strain: Not on file  Food Insecurity: Not on file  Transportation Needs: Not on file  Physical Activity: Not on file  Stress: Not on file  Social Connections: Not on file     Family History:  The patient's ***family history includes CAD in his maternal uncle; CVA in his sister; Heart attack in his father.   Review of Systems:    Please see the history of present illness.     All other systems reviewed and are otherwise negative except as noted above.   Physical Exam:    VS:  There were no vitals taken for this visit.   General: Well developed, well nourished,male appearing in no acute distress. Head: Normocephalic, atraumatic. Neck: No carotid bruits. JVD not elevated.  Lungs: Respirations regular and unlabored, without wheezes or rales.  Heart: ***Regular rate  and rhythm. No S3 or S4.  No murmur, no rubs, or gallops appreciated. Abdomen: Appears non-distended. No obvious abdominal masses. Msk:  Strength and tone appear normal for age. No obvious joint deformities or effusions. Extremities: No clubbing or cyanosis. No edema.  Distal pedal pulses are 2+ bilaterally. Neuro: Alert and oriented X 3. Moves all extremities spontaneously. No focal deficits noted. Psych:  Responds to questions appropriately with a normal affect. Skin: No rashes or lesions noted  Wt Readings from Last 3 Encounters:  06/02/21 272 lb 0.8 oz (123.4 kg)  02/06/21 241 lb (109.3 kg)  04/07/19 241 lb (109.3 kg)        Studies/Labs Reviewed:   EKG:  EKG is*** ordered today.  The ekg ordered today demonstrates ***  Recent Labs: 06/01/2021: ALT 32; B Natriuretic Peptide 131.0 06/02/2021: BUN 24; Creatinine, Ser 0.99; Hemoglobin 14.8; Platelets 248; Potassium 3.7; Sodium 136   Lipid Panel    Component Value Date/Time   CHOL 176 06/02/2021 0549   CHOL 181 10/15/2017 1559   TRIG 271 (H) 06/02/2021 0549   HDL 36 (L) 06/02/2021 0549   HDL 38 (L) 10/15/2017 1559   CHOLHDL 4.9 06/02/2021 0549   VLDL 54 (H) 06/02/2021 0549   LDLCALC 86 06/02/2021 0549   LDLCALC 94 10/15/2017 1559   LDLCALC 78 04/05/2017 1216    Additional studies/ records that were reviewed today include:   LHC: 04/2016 Conclusions: Significant 3-vessel coronary artery disease, including sequential 50-70% ostial, proximal, and mid LAD stenoses, which are hemodynamically significant (resting Pd/Pa 0.72), 50% in-stent restenosis in mid LCx as well as 80% 90% stenoses involving OM2 and distal LCx, and chronic total occlusion of mid RCA with distal vessel filling via bridging and left-to-right collaterals. Upper normal left ventricular filling pressure. Moderately reduced left ventricular contraction with inferior akinesis (LVEF 35-40%).   Recommendations: Cardiac surgery consultation for CABG, given  significant 3-vessel coronary artery disease, reduced LV function, and diabetes mellitus. Aggressive secondary prevention, including escalation of statin therapy. Start isosorbide mononitrate 30 mg daily; if patient experiences side effects, he can decrease the dose to 15 mg daily. Proceed with transthoracic echocardiogram, as previously ordered.  Echo: 06/2021 IMPRESSIONS  1. Left ventricular ejection fraction, by estimation, is 50 to 55%. The  left ventricle has low normal function. The left ventricle has no regional  wall motion abnormalities. There is mild left ventricular hypertrophy.  Left ventricular diastolic  parameters are consistent with Grade I diastolic dysfunction (impaired  relaxation).   2. Right ventricular systolic function is normal. The right ventricular  size is normal.   3. The mitral valve is normal in structure. No evidence of mitral valve  regurgitation. No evidence of mitral stenosis.   4. The aortic valve is tricuspid. There is mild calcification of the  aortic valve. There is mild thickening of the aortic valve. Aortic valve  regurgitation is not visualized. No aortic stenosis is present.   Assessment:    No diagnosis found.   Plan:   In order of problems listed above:  ***    Shared Decision Making/Informed Consent:   {Are you ordering a CV Procedure (e.g. stress test, cath, DCCV, TEE, etc)?   Press F2        :[id number]}    Medication Adjustments/Labs and Tests Ordered: Current medicines are reviewed at length with the patient today.  Concerns regarding medicines are outlined above.  Medication changes, Labs and Tests ordered today are listed in the Patient Instructions below. There are no Patient Instructions on file for this visit.   Signed, Erma Heritage, PA-C  06/21/2021 12:40 PM    [place] [facility] [address] [place], [place] [address] Phone: [phone] Fax: [phone]

## 2021-06-22 ENCOUNTER — Encounter: Payer: Self-pay | Admitting: Student

## 2021-06-22 ENCOUNTER — Ambulatory Visit: Payer: Self-pay | Admitting: Student

## 2022-08-13 ENCOUNTER — Encounter: Payer: Self-pay | Admitting: *Deleted

## 2023-11-18 NOTE — Progress Notes (Signed)
 Cardiology Office Note:   Date:  11/18/2023  ID:  Juan Ray, DOB 01-09-1960, MRN [id number] PCP: [facility] Providers Cardiologist:  Ozell Fell, MD { Chief Complaint: No chief complaint on file.     History of Present Illness:   Juan Ray is a 63 y.o. male with a PMH of CAD s/p 4V CABG (LIMA-LAD, SVG-OM1, SVG-OM2, SVG-RCA; 2018), HTN, HLD, DM2, obesity, pancreatitis, and PUD who presents for follow up.  Last seen by cardiology in 2020.   Past Medical History:  Diagnosis Date   Anxiety    Arthritis    CAD (coronary artery disease) 04/07/2012   a. Inferoposterior STEMI s/p DES-mid LCx. b. s/p CABG 04/2016.   Diabetes mellitus    Diabetic neuropathy (HCC)    GERD (gastroesophageal reflux disease)    High cholesterol    History of kidney stones    Hypertension    Ischemic cardiomyopathy    a. EF not totally clear - in 04/2016 was 35-40% by cath, 55-60% by echo, 40-45% by TEE all around same time.   Myocardial infarction [facility]) 2013   Neuropathy    Obesity    Pancreatitis    a. mild by CT 06/2011   Peptic ulcer disease    a. 06/2009 EGD: multiple gastric and duodenal ulcers.     Studies Reviewed:    EKG: ***       Cardiac Studies & Procedures   ______________________________________________________________________________________________ CARDIAC CATHETERIZATION  CARDIAC CATHETERIZATION 04/13/2016  Conclusion Conclusions: 1. Significant 3-vessel coronary artery disease, including sequential 50-70% ostial, proximal, and mid LAD stenoses, which are hemodynamically significant (resting Pd/Pa 0.72), 50% in-stent restenosis in mid LCx as well as 80% 90% stenoses involving OM2 and distal LCx, and chronic total occlusion of mid RCA with distal vessel filling via bridging and left-to-right collaterals. 2. Upper normal left ventricular filling pressure. 3. Moderately reduced left ventricular contraction with inferior akinesis (LVEF  35-40%).  Recommendations: 1. Cardiac surgery consultation for CABG, given significant 3-vessel coronary artery disease, reduced LV function, and diabetes mellitus. 2. Aggressive secondary prevention, including escalation of statin therapy. 3. Start isosorbide  mononitrate 30 mg daily; if patient experiences side effects, he can decrease the dose to 15 mg daily. 4. Proceed with transthoracic echocardiogram, as previously ordered.  Lonni Hanson, MD [facility] Pager: [phone]  Findings Coronary Findings Diagnostic  Dominance: Right  Left Main Vessel is large.  Left Anterior Descending Vessel is large. The lesion is eccentric. The lesion is moderately calcified. The lesion is irregular. The lesion is focal.  First Diagonal Branch Vessel is small in size. There is mild disease in the vessel.  Second Diagonal Branch Vessel is small in size. There is mild disease in the vessel.  Third Diagonal Branch Vessel is small in size.  Ramus Intermedius Vessel is small.  Left Circumflex Vessel is large. The lesion is eccentric. The lesion was previously treated using a drug eluting stent over 2 years ago. Previously placed stent displays restenosis.  First Obtuse Marginal Branch Vessel is moderate in size.  Second Obtuse Marginal Branch Vessel is moderate in size.  Third Obtuse Marginal Branch Vessel is small in size.  Right Coronary Artery Vessel is moderate in size. Collaterals Mid RCA filled by collaterals from Prox RCA.  The lesion is chronically occluded with bridging and left-to-right collateral flow.  Right Posterior Descending Artery Vessel is small in size.  Right Posterior Atrioventricular Artery Vessel is moderate in size.  First Right Posterolateral  Branch Collaterals 1st RPL filled by collaterals from Dist Cx.  Intervention  No interventions have been documented.   STRESS TESTS  NM MYOCAR MULTI W/SPECT W 03/16/2016  Narrative   There was no ST segment deviation noted during stress.  Findings consistent with prior large inferior and inferolateral myocardial infarction. There is a small anteriorinfarct with mild peri-infarct ischemia.  This is a high risk study. High risk based on large scar and decreased LVEF. Fairly mild area of myocardium currently at jeopardy in the anterior wall.  The left ventricular ejection fraction is severely decreased (<30%).   ECHOCARDIOGRAM  ECHOCARDIOGRAM COMPLETE 06/01/2021  Narrative ECHOCARDIOGRAM REPORT    Patient Name:   Juan Ray Date of Exam: 06/01/2021 Medical Rec #:  [id number]      Height:       69.0 in Accession #:    [id number]     Weight:       270.0 lb Date of Birth:  [birth year]     BSA:          2.347 m Patient Age:    60 years       BP:           102/76 mmHg Patient Gender: M              HR:           73 bpm. Exam Location:  [facility]  Procedure: 2D Echo, Cardiac Doppler and Color Doppler  Indications:    Chest Pain R07.9  History:        Patient has prior history of Echocardiogram examinations, most recent 04/23/2016. CAD and Previous Myocardial Infarction, Prior CABG; Risk Factors:Hypertension, Diabetes and Dyslipidemia.  Sonographer:    Aida Pizza RCS Referring Phys: [id number] DORN FALCON BRANCH   Sonographer Comments: No subcostal window. IMPRESSIONS   1. Left ventricular ejection fraction, by estimation, is 50 to 55%. The left ventricle has low normal function. The left ventricle has no regional wall motion abnormalities. There is mild left ventricular hypertrophy. Left ventricular diastolic parameters are consistent with Grade I diastolic dysfunction (impaired relaxation). 2. Right ventricular systolic function is normal. The right ventricular size is normal. 3. The mitral valve is normal in structure. No evidence of mitral valve regurgitation. No evidence of mitral stenosis. 4. The aortic valve is tricuspid. There is mild calcification of the  aortic valve. There is mild thickening of the aortic valve. Aortic valve regurgitation is not visualized. No aortic stenosis is present.  FINDINGS Left Ventricle: Left ventricular ejection fraction, by estimation, is 50 to 55%. The left ventricle has low normal function. The left ventricle has no regional wall motion abnormalities. Definity  contrast agent was given IV to delineate the left ventricular endocardial borders. The left ventricular internal cavity size was normal in size. There is mild left ventricular hypertrophy. Left ventricular diastolic parameters are consistent with Grade I diastolic dysfunction (impaired relaxation).  Right Ventricle: The right ventricular size is normal. No increase in right ventricular wall thickness. Right ventricular systolic function is normal.  Left Atrium: Left atrial size was normal in size.  Right Atrium: Right atrial size was normal in size.  Pericardium: There is no evidence of pericardial effusion.  Mitral Valve: The mitral valve is normal in structure. No evidence of mitral valve regurgitation. No evidence of mitral valve stenosis.  Tricuspid Valve: The tricuspid valve is normal in structure. Tricuspid valve regurgitation is not demonstrated. No evidence of tricuspid stenosis.  Aortic Valve: The aortic valve is  tricuspid. There is mild calcification of the aortic valve. There is mild thickening of the aortic valve. There is mild aortic valve annular calcification. Aortic valve regurgitation is not visualized. No aortic stenosis is present. Aortic valve mean gradient measures 3.4 mmHg. Aortic valve peak gradient measures 6.2 mmHg. Aortic valve area, by VTI measures 2.61 cm.  Pulmonic Valve: The pulmonic valve was not well visualized. Pulmonic valve regurgitation is not visualized. No evidence of pulmonic stenosis.  Aorta: The aortic root is normal in size and structure.  IAS/Shunts: The interatrial septum was not well visualized.   LEFT  VENTRICLE PLAX 2D LVIDd:         5.00 cm   Diastology LVIDs:         4.20 cm   LV e' medial:    5.11 cm/s LV PW:         1.20 cm   LV E/e' medial:  11.1 LV IVS:        1.10 cm   LV e' lateral:   7.72 cm/s LVOT diam:     2.10 cm   LV E/e' lateral: 7.4 LV SV:         64 LV SV Index:   27 LVOT Area:     3.46 cm   RIGHT VENTRICLE RV S prime:     7.72 cm/s TAPSE (M-mode): 1.5 cm  LEFT ATRIUM             Index        RIGHT ATRIUM           Index LA diam:        4.30 cm 1.83 cm/m   RA Area:     15.70 cm LA Vol (A2C):   68.2 ml 29.06 ml/m  RA Volume:   39.60 ml  16.87 ml/m LA Vol (A4C):   62.6 ml 26.67 ml/m LA Biplane Vol: 65.7 ml 27.99 ml/m AORTIC VALVE AV Area (Vmax):    2.75 cm AV Area (Vmean):   2.38 cm AV Area (VTI):     2.61 cm AV Vmax:           124.66 cm/s AV Vmean:          88.345 cm/s AV VTI:            0.246 m AV Peak Grad:      6.2 mmHg AV Mean Grad:      3.4 mmHg LVOT Vmax:         98.80 cm/s LVOT Vmean:        60.800 cm/s LVOT VTI:          0.185 m LVOT/AV VTI ratio: 0.75  AORTA Ao Root diam: 3.80 cm  MITRAL VALVE MV Area (PHT): 2.62 cm    SHUNTS MV Decel Time: 289 msec    Systemic VTI:  0.18 m MV E velocity: 56.90 cm/s  Systemic Diam: 2.10 cm MV A velocity: 74.10 cm/s MV E/A ratio:  0.77  Dorn Ross MD Electronically signed by Dorn Ross MD Signature Date/Time: 06/01/2021/3:54:22 PM    Final   TEE  ECHO TEE 04/23/2016  Interpretation Summary  Septum: No Patent Foramen Ovale present.  Left atrium: Patent foramen ovale not present.  Right ventricle: Normal wall thickness and ejection fraction. Cavity is mildly dilated.  Tricuspid valve: Trace regurgitation. The tricuspid valve regurgitation jet is central.  Mitral valve: No leaflet thickening and calcification present. Mild mitral annular calcification. Trace regurgitation.  Aortic valve: The valve is trileaflet. No  stenosis. No regurgitation. Possible Lambl's excresence on  right coronary cusp .  Left ventricle: Normal cavity size and wall thickness. LV systolic function is mildly to moderately reduced with an EF of 40-45%. Wall motion is abnormal. Inferior wall motion is akinetic.  Left ventricle: Normal cavity size.        ______________________________________________________________________________________________      Risk Assessment/Calculations:   {Does this patient have ATRIAL FIBRILLATION?[phone]} No BP recorded.  {Refresh Note OR Click here to enter BP  :1}***        Physical Exam:     VS:  There were no vitals taken for this visit. ***    Wt Readings from Last 3 Encounters:  06/02/21 272 lb 0.8 oz (123.4 kg)  02/06/21 241 lb (109.3 kg)  04/07/19 241 lb (109.3 kg)     GEN: Well nourished, well developed, in no acute distress NECK: No JVD; No carotid bruits CARDIAC: ***RRR, no murmurs, rubs, gallops RESPIRATORY:  Clear to auscultation without rales, wheezing or rhonchi  ABDOMEN: Soft, non-tender, non-distended, normal bowel sounds EXTREMITIES:  Warm and well perfused, no edema; No deformity, 2+ radial pulses PSYCH: Normal mood and affect   Assessment & Plan S/P CABG x 4  Primary hypertension  Mixed hyperlipidemia  Uncontrolled type 2 diabetes mellitus with hyperglycemia, without long-term current use of insulin  (HCC) - SGLT2      {Are you ordering a CV Procedure (e.g. stress test, cath, DCCV, TEE, etc)?   Press F2        :[id number]}   This note was written with the assistance of a dictation microphone or AI dictation software. Please excuse any typos or grammatical errors.   Signed, Georganna Archer, MD 11/18/2023 3:50 PM    [facility]

## 2023-11-18 NOTE — Assessment & Plan Note (Signed)
 SGLT2

## 2023-11-19 ENCOUNTER — Encounter: Payer: Self-pay | Admitting: Student in an Organized Health Care Education/Training Program

## 2023-11-19 ENCOUNTER — Ambulatory Visit
Payer: Self-pay | Attending: Student in an Organized Health Care Education/Training Program | Admitting: Student in an Organized Health Care Education/Training Program

## 2023-11-19 VITALS — BP 120/80 | HR 83 | Ht 68.0 in | Wt 260.0 lb

## 2023-11-19 DIAGNOSIS — I10 Essential (primary) hypertension: Secondary | ICD-10-CM | POA: Diagnosis not present

## 2023-11-19 DIAGNOSIS — G459 Transient cerebral ischemic attack, unspecified: Secondary | ICD-10-CM | POA: Diagnosis not present

## 2023-11-19 DIAGNOSIS — E782 Mixed hyperlipidemia: Secondary | ICD-10-CM

## 2023-11-19 DIAGNOSIS — Z951 Presence of aortocoronary bypass graft: Secondary | ICD-10-CM

## 2023-11-19 DIAGNOSIS — E1165 Type 2 diabetes mellitus with hyperglycemia: Secondary | ICD-10-CM

## 2023-11-19 LAB — LIPID PANEL
Chol/HDL Ratio: 4.7 ratio (ref 0.0–5.0)
Cholesterol, Total: 193 mg/dL (ref 100–199)
HDL: 41 mg/dL (ref >39)
LDL Chol Calc (NIH): 106 mg/dL — ABNORMAL HIGH (ref 0–99)
Triglycerides: 268 mg/dL — ABNORMAL HIGH (ref 0–149)
VLDL Cholesterol Cal: 46 mg/dL — ABNORMAL HIGH (ref 5–40)

## 2023-11-19 LAB — BASIC METABOLIC PANEL WITH GFR
BUN/Creatinine Ratio: 23 (ref 10–24)
BUN: 23 mg/dL (ref 8–27)
CO2: 27 mmol/L (ref 20–29)
Calcium: 9.4 mg/dL (ref 8.6–10.2)
Chloride: 98 mmol/L (ref 96–106)
Creatinine, Ser: 1 mg/dL (ref 0.76–1.27)
Glucose: 184 mg/dL — ABNORMAL HIGH (ref 70–99)
Potassium: 4 mmol/L (ref 3.5–5.2)
Sodium: 137 mmol/L (ref 134–144)
eGFR: 84 mL/min/1.73 (ref >59)

## 2023-11-19 LAB — HEMOGLOBIN A1C
Est. average glucose Bld gHb Est-mCnc: 192 mg/dL
Hgb A1c MFr Bld: 8.3 % — ABNORMAL HIGH (ref 4.8–5.6)

## 2023-11-19 MED ORDER — LISINOPRIL 20 MG PO TABS: 20.0000 mg | ORAL_TABLET | Freq: Every day | ORAL | Status: AC

## 2023-11-19 MED ORDER — FUROSEMIDE 20 MG PO TABS: 20.0000 mg | ORAL_TABLET | Freq: Every day | ORAL | Status: AC | PRN

## 2023-11-19 MED ORDER — EMPAGLIFLOZIN 10 MG PO TABS: 10.0000 mg | ORAL_TABLET | Freq: Every day | ORAL | 3 refills | Status: AC

## 2023-11-19 NOTE — Assessment & Plan Note (Signed)
-   Patient status post CABG in 2018.  - Currently asymptomatic and taking his medications without side effect. -Given that the patient has diabetes and CAD, I think he would benefit from an SGLT2 inhibitor. -Now the patient has insurance he feels like he is likely to be able to afford it. -He has not been taking his Lasix  for the past month that has no swelling. Continue baby aspirin  81 mg daily Start Jardiance  10 mg daily Change Lasix  to 20 mg as needed for swelling Follow-up in 12 months

## 2023-11-19 NOTE — Patient Instructions (Signed)
 Medication Instructions:  START Jardiance  10 mg once daily   CHANGE Furosemide  (Lasix ) to as needed  *If you need a refill on your cardiac medications before your next appointment, please call your pharmacy*  Lab Work: To be completed today: lipid panel, BMP, hgbA1C  If you have labs (blood work) drawn today and your tests are completely normal, you will receive your results only by: MyChart Message (if you have MyChart) OR A paper copy in the mail If you have any lab test that is abnormal or we need to change your treatment, we will call you to review the results.  Testing/Procedures: CTA head/neck  Follow-Up: At [facility], you and your health needs are our priority.  As part of our continuing mission to provide you with exceptional heart care, our providers are all part of one team.  This team includes your primary Cardiologist (physician) and Advanced Practice Providers or APPs (Physician Assistants and Nurse Practitioners) who all work together to provide you with the care you need, when you need it.  Your next appointment:   1 year(s)  Provider:   Dr. Floretta    We recommend signing up for the patient portal called MyChart.  Sign up information is provided on this After Visit Summary.  MyChart is used to connect with patients for Virtual Visits (Telemedicine).  Patients are able to view lab/test results, encounter notes, upcoming appointments, etc.  Non-urgent messages can be sent to your provider as well.   To learn more about what you can do with MyChart, go to [email or web address].   Other Instructions     Mediterranean Diet  Why follow it? Research shows. Those who follow the Mediterranean diet have a reduced risk of heart disease  The diet is associated with a reduced incidence of Parkinson's and Alzheimer's diseases People following the diet may have longer life expectancies and lower rates of chronic diseases  The Dietary Guidelines for Americans  recommends the Mediterranean diet as an eating plan to promote health and prevent disease  What Is the Mediterranean Diet?  Healthy eating plan based on typical foods and recipes of Mediterranean-style cooking The diet is primarily a plant based diet; these foods should make up a majority of meals   Starches - Plant based foods should make up a majority of meals - They are an important sources of vitamins, minerals, energy, antioxidants, and fiber - Choose whole grains, foods high in fiber and minimally processed items  - Typical grain sources include wheat, oats, barley, corn, brown rice, bulgar, farro, millet, polenta, couscous  - Various types of beans include chickpeas, lentils, fava beans, black beans, white beans   Fruits  Veggies - Large quantities of antioxidant rich fruits & veggies; 6 or more servings  - Vegetables can be eaten raw or lightly drizzled with oil and cooked  - Vegetables common to the traditional Mediterranean Diet include: artichokes, arugula, beets, broccoli, brussel sprouts, cabbage, carrots, celery, collard greens, cucumbers, eggplant, kale, leeks, lemons, lettuce, mushrooms, okra, onions, peas, peppers, potatoes, pumpkin, radishes, rutabaga, shallots, spinach, sweet potatoes, turnips, zucchini - Fruits common to the Mediterranean Diet include: apples, apricots, avocados, cherries, clementines, dates, figs, grapefruits, grapes, melons, nectarines, oranges, peaches, pears, pomegranates, strawberries, tangerines  Fats - Replace butter and margarine with healthy oils, such as olive oil, canola oil, and tahini  - Limit nuts to no more than a handful a day  - Nuts include walnuts, almonds, pecans, pistachios, pine nuts  - Limit or avoid candied,  honey roasted or heavily salted nuts - Olives are central to the Mediterranean diet - can be eaten whole or used in a variety of dishes   Meats Protein - Limiting red meat: no more than a few times a month - When eating red meat:  choose lean cuts and keep the portion to the size of deck of cards - Eggs: approx. 0 to 4 times a week  - Fish and lean poultry: at least 2 a week  - Healthy protein sources include, chicken, turkey, lean beef, lamb - Increase intake of seafood such as tuna, salmon, trout, mackerel, shrimp, scallops - Avoid or limit high fat processed meats such as sausage and bacon  Dairy - Include moderate amounts of low fat dairy products  - Focus on healthy dairy such as fat free yogurt, skim milk, low or reduced fat cheese - Limit dairy products higher in fat such as whole or 2% milk, cheese, ice cream  Alcohol - Moderate amounts of red wine is ok  - No more than 5 oz daily for women (all ages) and men older than age 35  - No more than 10 oz of wine daily for men younger than 4  Other - Limit sweets and other desserts  - Use herbs and spices instead of salt to flavor foods  - Herbs and spices common to the traditional Mediterranean Diet include: basil, bay leaves, chives, cloves, cumin, fennel, garlic, lavender, marjoram, mint, oregano, parsley, pepper, rosemary, sage, savory, sumac, tarragon, thyme   It's not just a diet, it's a lifestyle:  The Mediterranean diet includes lifestyle factors typical of those in the region  Foods, drinks and meals are best eaten with others and savored Daily physical activity is important for overall good health This could be strenuous exercise like running and aerobics This could also be more leisurely activities such as walking, housework, yard-work, or taking the stairs Moderation is the key; a balanced and healthy diet accommodates most foods and drinks Consider portion sizes and frequency of consumption of certain foods   Meal Ideas & Options:  Breakfast:  Whole wheat toast or whole wheat English muffins with peanut butter & hard boiled egg Steel cut oats topped with apples & cinnamon and skim milk  Fresh fruit: banana, strawberries, melon, berries, peaches   Smoothies: strawberries, bananas, greek yogurt, peanut butter Low fat greek yogurt with blueberries and granola  Egg white omelet with spinach and mushrooms Breakfast couscous: whole wheat couscous, apricots, skim milk, cranberries  Sandwiches:  Hummus and grilled vegetables (peppers, zucchini, squash) on whole wheat bread   Grilled chicken on whole wheat pita with lettuce, tomatoes, cucumbers or tzatziki  Tuna salad on whole wheat bread: tuna salad made with greek yogurt, olives, red peppers, capers, green onions Garlic rosemary lamb pita: lamb sauted with garlic, rosemary, salt & pepper; add lettuce, cucumber, greek yogurt to pita - flavor with lemon juice and black pepper  Seafood:  Mediterranean grilled salmon, seasoned with garlic, basil, parsley, lemon juice and black pepper Shrimp, lemon, and spinach whole-grain pasta salad made with low fat greek yogurt  Seared scallops with lemon orzo  Seared tuna steaks seasoned salt, pepper, coriander topped with tomato mixture of olives, tomatoes, olive oil, minced garlic, parsley, green onions and cappers  Meats:  Herbed greek chicken salad with kalamata olives, cucumber, feta  Red bell peppers stuffed with spinach, bulgur, lean ground beef (or lentils) & topped with feta   Kebabs: skewers of chicken, tomatoes, onions, zucchini,  squash  Turkey burgers: made with red onions, mint, dill, lemon juice, feta cheese topped with roasted red peppers Vegetarian Cucumber salad: cucumbers, artichoke hearts, celery, red onion, feta cheese, tossed in olive oil & lemon juice  Hummus and whole grain pita points with a greek salad (lettuce, tomato, feta, olives, cucumbers, red onion) Lentil soup with celery, carrots made with vegetable broth, garlic, salt and pepper  Tabouli salad: parsley, bulgur, mint, scallions, cucumbers, tomato, radishes, lemon juice, olive oil, salt and pepper.

## 2023-11-19 NOTE — Assessment & Plan Note (Signed)
Continue atorvastatin 40mg  daily  Lipid panel

## 2023-11-19 NOTE — Assessment & Plan Note (Signed)
-   His blood pressure is well-controlled.  He states that he is actually on 20 mg of lisinopril  daily and not 2.5.  I updated his medication reconciliation to reflect this. Continue lisinopril  20 mg daily Check BMP Prescribed blood pressure cuff

## 2023-11-20 ENCOUNTER — Ambulatory Visit: Payer: Self-pay | Admitting: Student in an Organized Health Care Education/Training Program

## 2023-11-20 DIAGNOSIS — E782 Mixed hyperlipidemia: Secondary | ICD-10-CM

## 2023-11-22 ENCOUNTER — Other Ambulatory Visit: Payer: Self-pay

## 2023-11-22 DIAGNOSIS — E782 Mixed hyperlipidemia: Secondary | ICD-10-CM

## 2023-11-22 MED ORDER — ATORVASTATIN CALCIUM 80 MG PO TABS: ORAL_TABLET | ORAL | 3 refills | Status: AC

## 2024-01-10 ENCOUNTER — Other Ambulatory Visit: Payer: Self-pay

## 2024-01-14 LAB — SURGICAL PATHOLOGY
